# Patient Record
Sex: Male | Born: 1935
Health system: Southern US, Community
[De-identification: ages and names within clinical notes are randomized; demographics above are authoritative.]

## PROBLEM LIST (undated history)

## (undated) DIAGNOSIS — Z531 Procedure and treatment not carried out because of patient's decision for reasons of belief and group pressure: Secondary | ICD-10-CM

## (undated) DIAGNOSIS — N400 Enlarged prostate without lower urinary tract symptoms: Secondary | ICD-10-CM

## (undated) DIAGNOSIS — M1990 Unspecified osteoarthritis, unspecified site: Secondary | ICD-10-CM

## (undated) DIAGNOSIS — Z789 Other specified health status: Secondary | ICD-10-CM

## (undated) DIAGNOSIS — IMO0001 Reserved for inherently not codable concepts without codable children: Secondary | ICD-10-CM

## (undated) DIAGNOSIS — E785 Hyperlipidemia, unspecified: Secondary | ICD-10-CM

## (undated) DIAGNOSIS — G709 Myoneural disorder, unspecified: Secondary | ICD-10-CM

## (undated) DIAGNOSIS — I714 Abdominal aortic aneurysm, without rupture, unspecified: Secondary | ICD-10-CM

## (undated) DIAGNOSIS — I10 Essential (primary) hypertension: Secondary | ICD-10-CM

## (undated) HISTORY — PX: SIGMOIDOSCOPY: SUR1295

## (undated) HISTORY — PX: CHOLECYSTECTOMY: SHX55

## (undated) HISTORY — DX: Abdominal aortic aneurysm, without rupture, unspecified: I71.40

## (undated) HISTORY — DX: Unspecified osteoarthritis, unspecified site: M19.90

## (undated) HISTORY — PX: CARPAL TUNNEL RELEASE: SHX101

## (undated) HISTORY — DX: Benign prostatic hyperplasia without lower urinary tract symptoms: N40.0

## (undated) HISTORY — PX: EYE SURGERY: SHX253

## (undated) HISTORY — DX: Hyperlipidemia, unspecified: E78.5

## (undated) HISTORY — DX: Abdominal aortic aneurysm, without rupture: I71.4

## (undated) HISTORY — DX: Essential (primary) hypertension: I10

---

## 1998-11-20 ENCOUNTER — Ambulatory Visit (HOSPITAL_COMMUNITY): Admission: RE | Admit: 1998-11-20 | Discharge: 1998-11-20 | Payer: Self-pay | Admitting: Cardiology

## 1998-11-20 ENCOUNTER — Encounter: Payer: Self-pay | Admitting: Cardiology

## 1999-10-09 ENCOUNTER — Encounter: Admission: RE | Admit: 1999-10-09 | Discharge: 2000-01-07 | Payer: Self-pay | Admitting: Family Medicine

## 2001-06-22 ENCOUNTER — Encounter: Admission: RE | Admit: 2001-06-22 | Discharge: 2001-09-20 | Payer: Self-pay | Admitting: Family Medicine

## 2001-08-31 ENCOUNTER — Ambulatory Visit (HOSPITAL_COMMUNITY): Admission: RE | Admit: 2001-08-31 | Discharge: 2001-08-31 | Payer: Self-pay | Admitting: Specialist

## 2001-09-12 ENCOUNTER — Ambulatory Visit (HOSPITAL_COMMUNITY): Admission: RE | Admit: 2001-09-12 | Discharge: 2001-09-12 | Payer: Self-pay | Admitting: Specialist

## 2006-05-06 ENCOUNTER — Ambulatory Visit: Payer: Self-pay | Admitting: Family Medicine

## 2006-09-14 HISTORY — PX: COLONOSCOPY: SHX174

## 2006-10-20 ENCOUNTER — Ambulatory Visit: Payer: Self-pay | Admitting: Family Medicine

## 2006-10-27 ENCOUNTER — Ambulatory Visit: Payer: Self-pay | Admitting: Family Medicine

## 2006-11-02 ENCOUNTER — Ambulatory Visit: Payer: Self-pay | Admitting: Family Medicine

## 2007-05-23 ENCOUNTER — Ambulatory Visit: Payer: Self-pay | Admitting: Family Medicine

## 2007-07-08 ENCOUNTER — Ambulatory Visit: Payer: Self-pay | Admitting: Family Medicine

## 2007-07-13 ENCOUNTER — Ambulatory Visit (HOSPITAL_COMMUNITY): Admission: RE | Admit: 2007-07-13 | Discharge: 2007-07-13 | Payer: Self-pay | Admitting: Family Medicine

## 2007-08-26 ENCOUNTER — Ambulatory Visit: Payer: Self-pay | Admitting: Family Medicine

## 2007-09-13 ENCOUNTER — Encounter (HOSPITAL_BASED_OUTPATIENT_CLINIC_OR_DEPARTMENT_OTHER): Payer: Self-pay | Admitting: General Surgery

## 2007-09-15 ENCOUNTER — Inpatient Hospital Stay (HOSPITAL_COMMUNITY): Admission: RE | Admit: 2007-09-15 | Discharge: 2007-09-16 | Payer: Self-pay | Admitting: General Surgery

## 2007-09-15 HISTORY — PX: TOTAL KNEE ARTHROPLASTY: SHX125

## 2007-09-28 ENCOUNTER — Encounter: Admission: RE | Admit: 2007-09-28 | Discharge: 2007-09-28 | Payer: Self-pay | Admitting: Gastroenterology

## 2007-10-31 ENCOUNTER — Ambulatory Visit: Payer: Self-pay | Admitting: Family Medicine

## 2007-11-02 ENCOUNTER — Ambulatory Visit: Payer: Self-pay | Admitting: Family Medicine

## 2007-11-07 ENCOUNTER — Ambulatory Visit (HOSPITAL_COMMUNITY): Admission: RE | Admit: 2007-11-07 | Discharge: 2007-11-07 | Payer: Self-pay | Admitting: Ophthalmology

## 2007-11-07 ENCOUNTER — Encounter (INDEPENDENT_AMBULATORY_CARE_PROVIDER_SITE_OTHER): Payer: Self-pay | Admitting: Surgery

## 2007-11-17 ENCOUNTER — Ambulatory Visit: Payer: Self-pay | Admitting: Family Medicine

## 2008-01-17 ENCOUNTER — Ambulatory Visit: Payer: Self-pay | Admitting: Family Medicine

## 2008-01-17 ENCOUNTER — Encounter: Admission: RE | Admit: 2008-01-17 | Discharge: 2008-01-17 | Payer: Self-pay | Admitting: Family Medicine

## 2008-02-09 ENCOUNTER — Ambulatory Visit: Payer: Self-pay | Admitting: Family Medicine

## 2008-03-21 ENCOUNTER — Inpatient Hospital Stay (HOSPITAL_COMMUNITY): Admission: RE | Admit: 2008-03-21 | Discharge: 2008-03-27 | Payer: Self-pay | Admitting: Orthopedic Surgery

## 2008-03-27 ENCOUNTER — Ambulatory Visit: Payer: Self-pay | Admitting: Vascular Surgery

## 2008-03-27 ENCOUNTER — Encounter (INDEPENDENT_AMBULATORY_CARE_PROVIDER_SITE_OTHER): Payer: Self-pay | Admitting: Orthopedic Surgery

## 2008-05-22 ENCOUNTER — Ambulatory Visit: Payer: Self-pay | Admitting: Family Medicine

## 2008-11-23 ENCOUNTER — Ambulatory Visit: Payer: Self-pay | Admitting: Family Medicine

## 2008-11-29 ENCOUNTER — Ambulatory Visit: Payer: Self-pay | Admitting: Family Medicine

## 2009-01-21 ENCOUNTER — Ambulatory Visit: Payer: Self-pay | Admitting: Family Medicine

## 2009-01-27 IMAGING — CR DG CHEST 2V
2 series · 2 of 2 positions shown · non-contrast
Comparison: None.

CLINICAL DATA: Pre-admit for cholecystitis.   
 CHEST - 2 VIEW:

[view not recorded (1 of 2)]
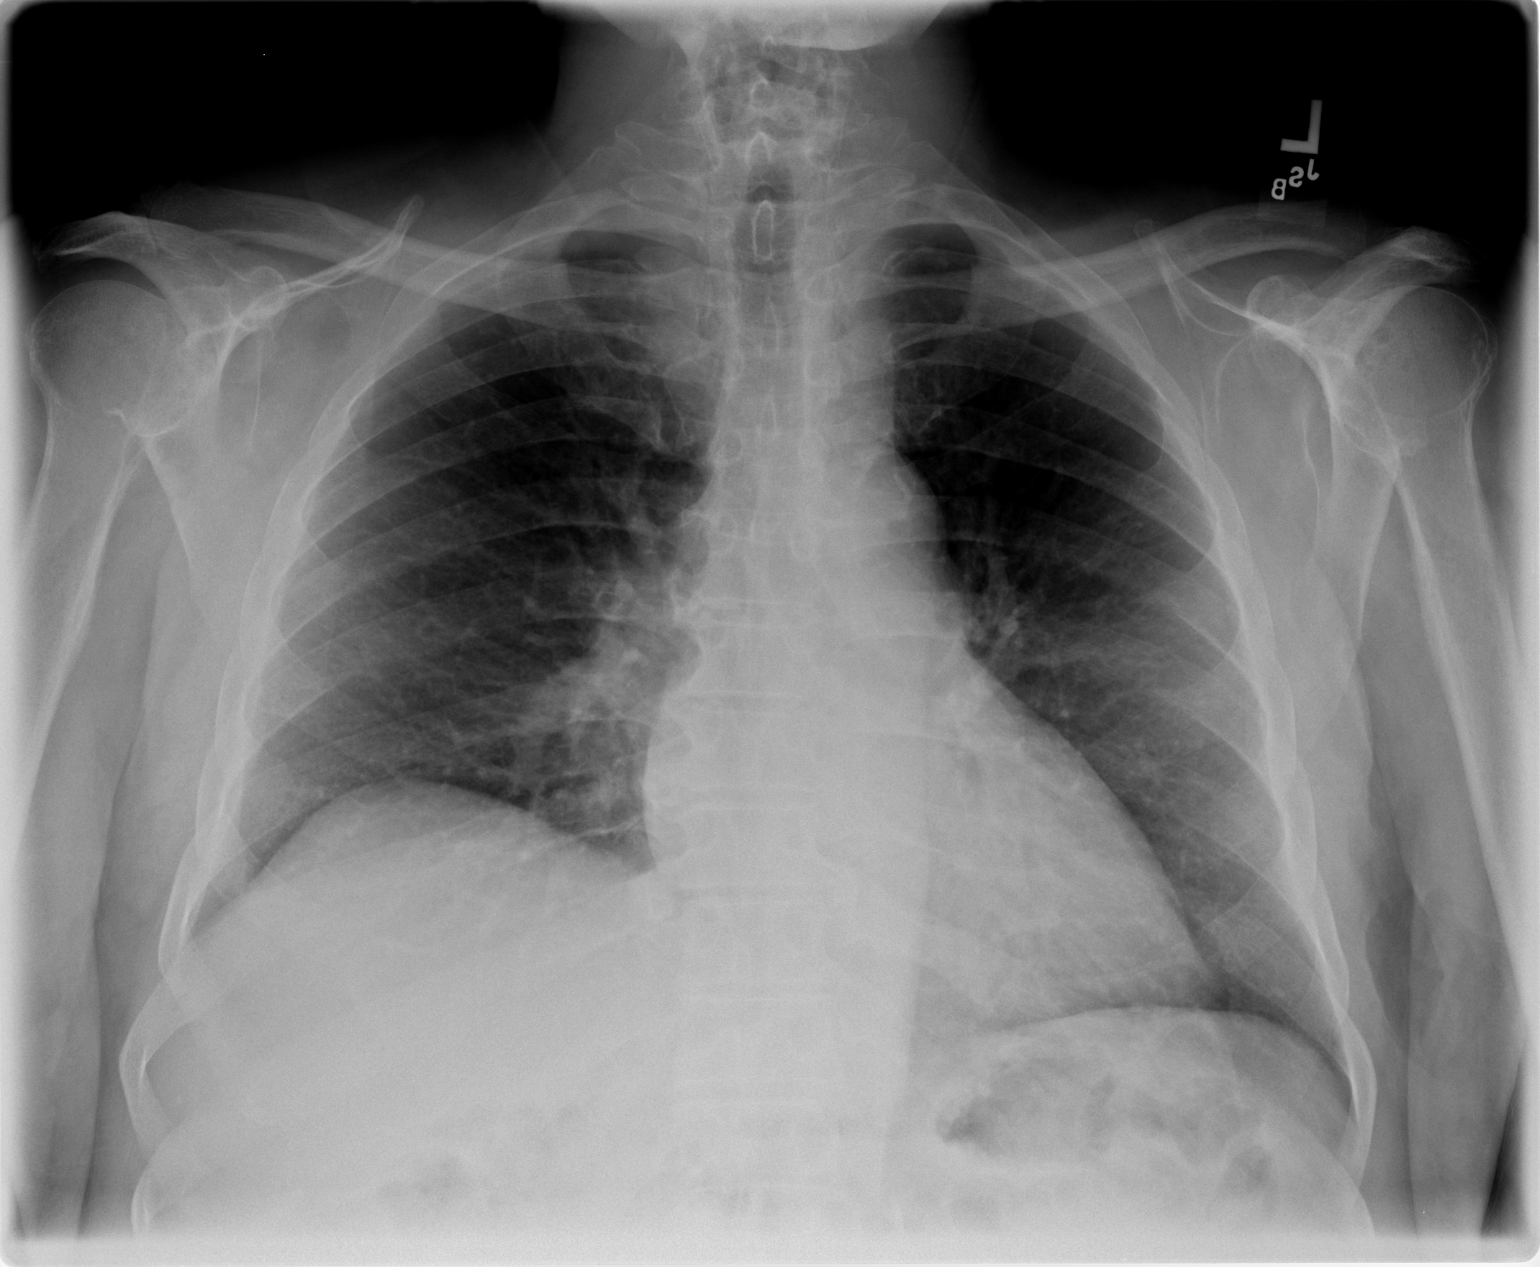

[view not recorded (2 of 2)]
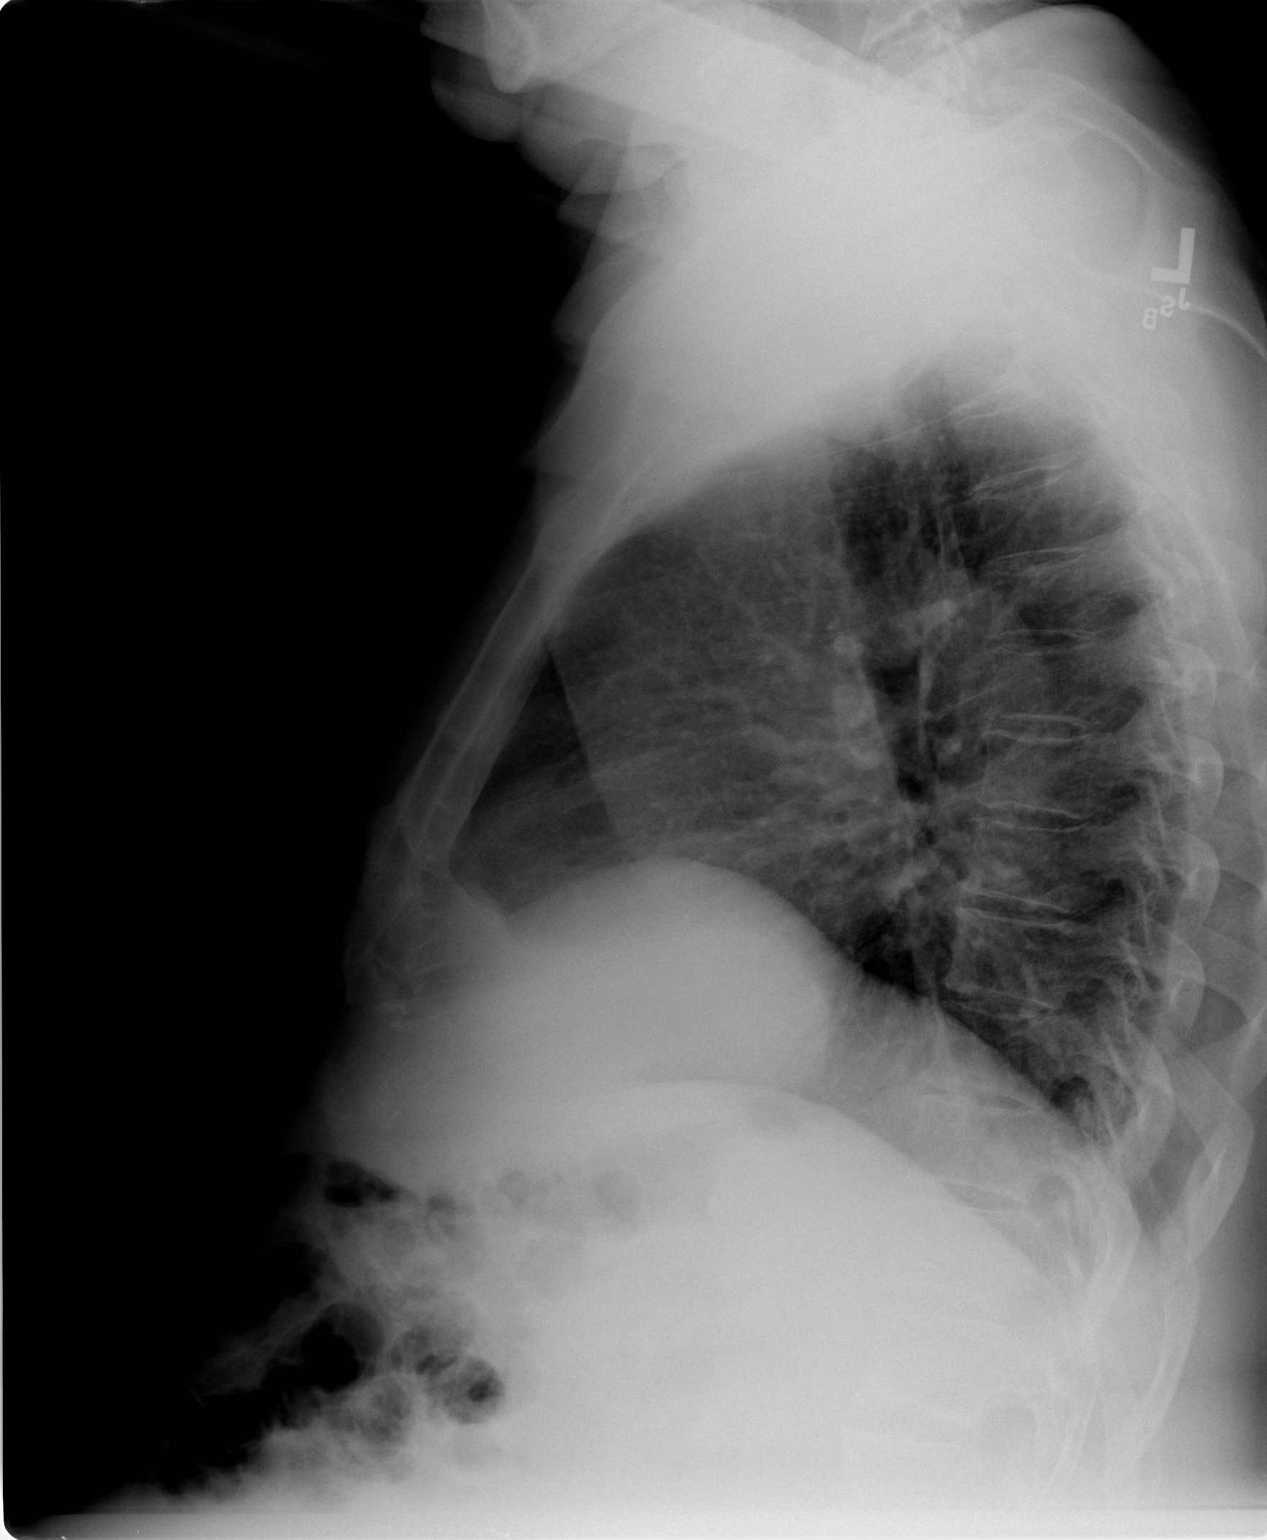

[2 of 2 positions shown; findings below may reference images not displayed]

FINDINGS: Heart and mediastinal contours normal.  Mild peribronchial thickening.  Mild elevation/eventration of the right hemidiaphragm.  No acute process.  Osseous structures intact.
IMPRESSION: No active disease.

## 2009-06-20 ENCOUNTER — Ambulatory Visit: Payer: Self-pay | Admitting: Family Medicine

## 2009-08-12 IMAGING — CR DG KNEE 1-2V PORT*L*
2 series · 2 of 2 positions shown · non-contrast
Comparison: 01/17/2008

CLINICAL DATA: Osteoarthritis

PORTABLE LEFT KNEE - 1-2 VIEW

[ap/obl knee]
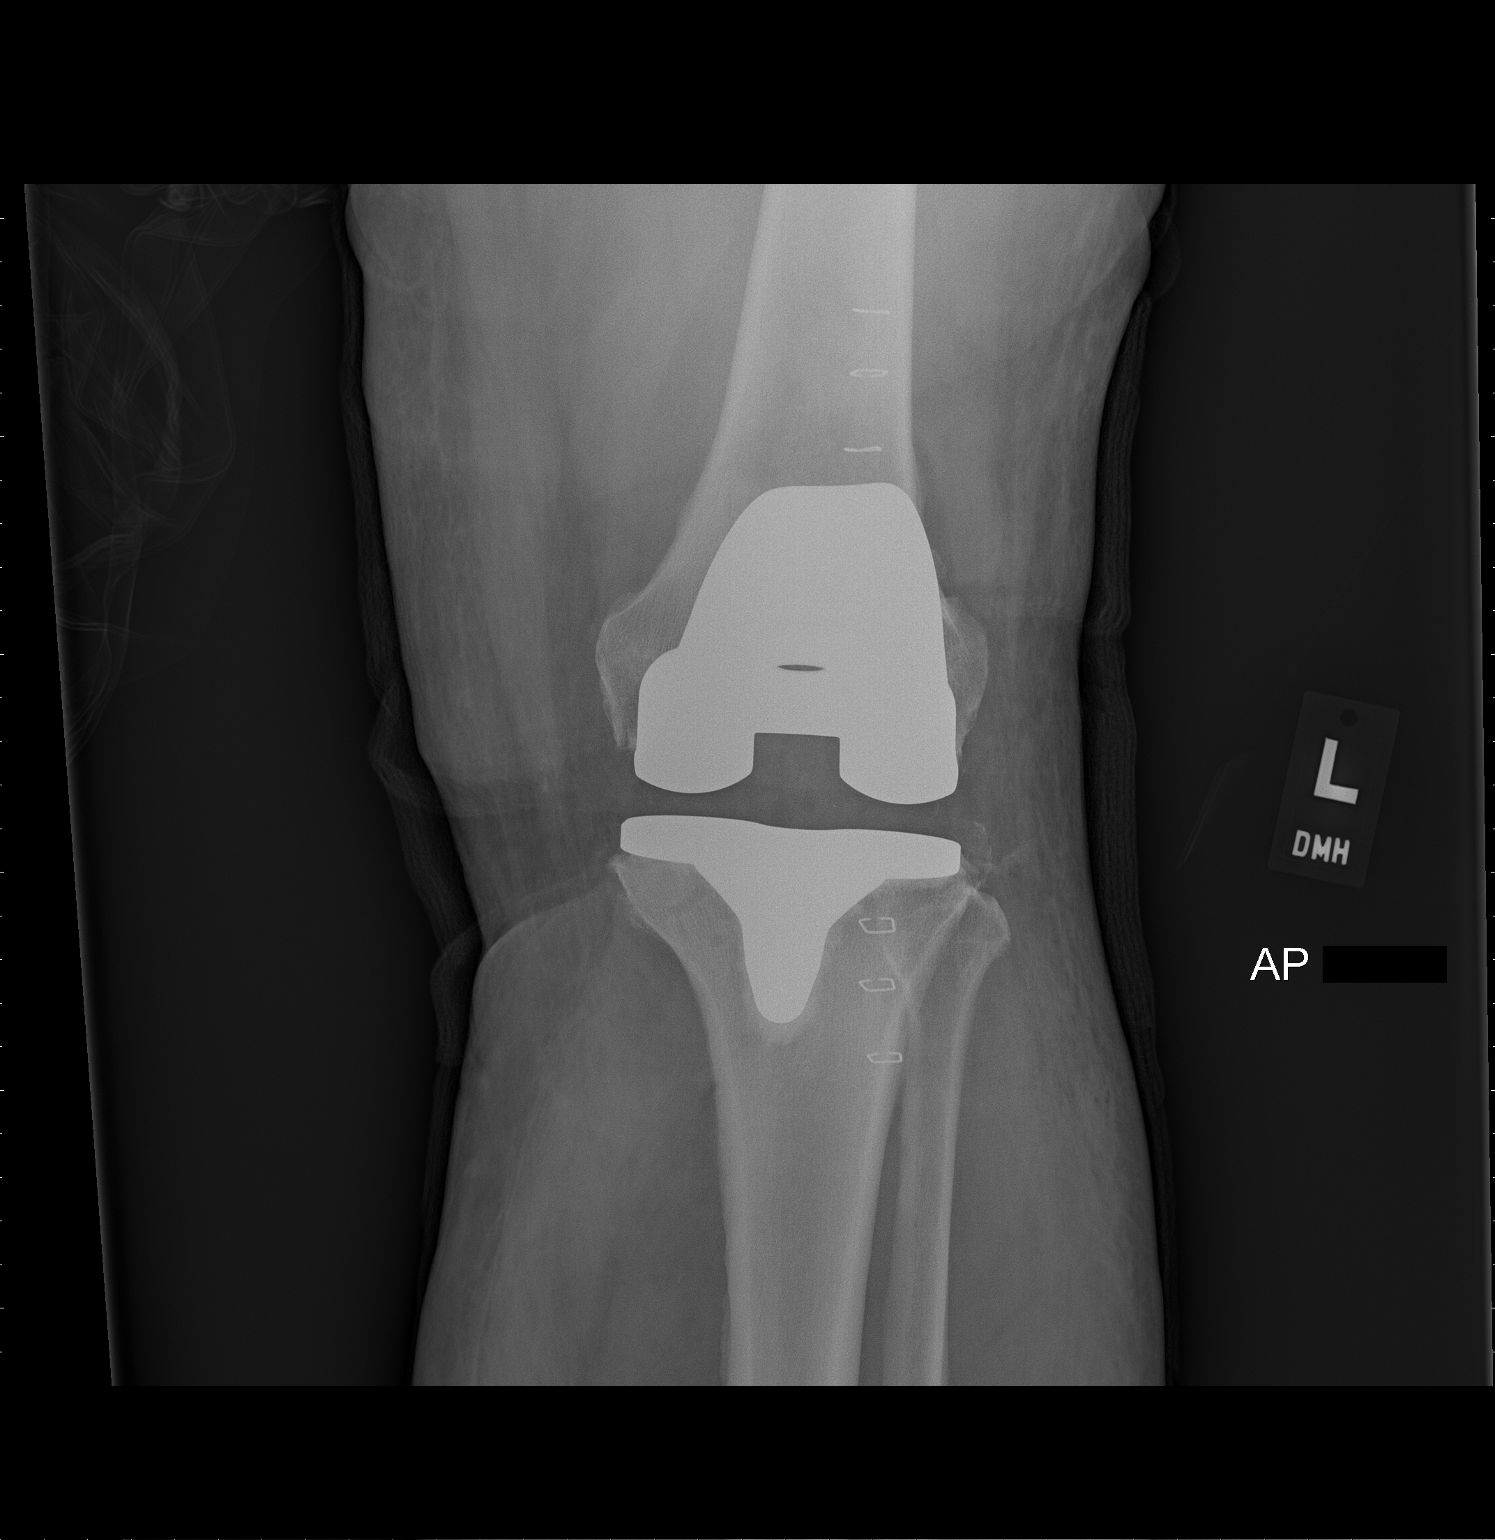

[knee lat]
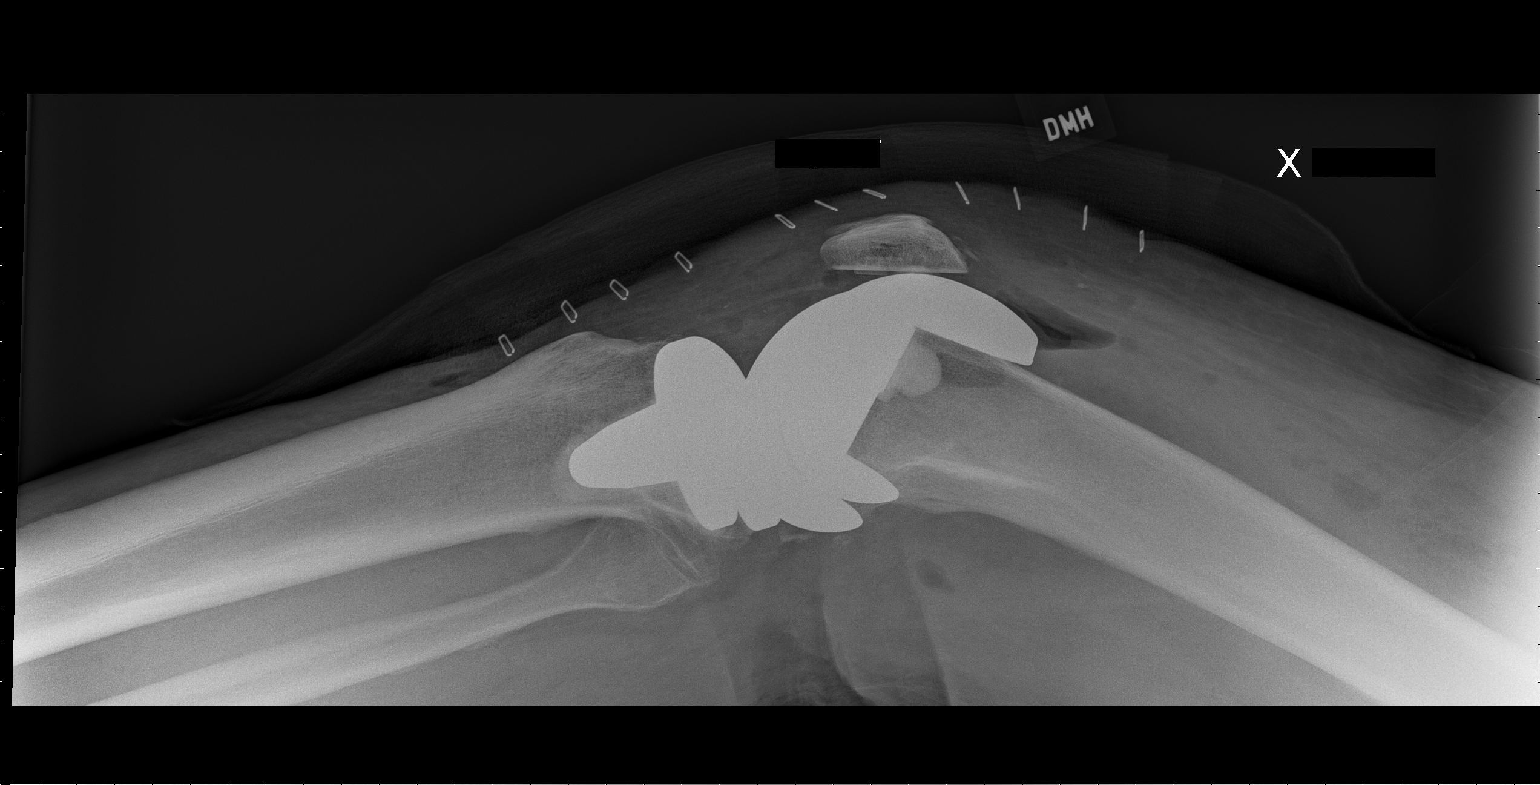

[2 of 2 positions shown; findings below may reference images not displayed]

FINDINGS: A total knee arthroplasty has been placed.  There is
anatomic alignment of the osseous and prosthetic structures.  There
is no breakage or loosening of the hardware.
IMPRESSION: Total knee arthroplasty, anatomically aligned.

## 2010-04-28 ENCOUNTER — Ambulatory Visit: Payer: Self-pay | Admitting: Family Medicine

## 2010-05-16 ENCOUNTER — Ambulatory Visit: Payer: Self-pay | Admitting: Physician Assistant

## 2010-05-29 ENCOUNTER — Ambulatory Visit: Payer: Self-pay | Admitting: Family Medicine

## 2010-10-05 ENCOUNTER — Encounter: Payer: Self-pay | Admitting: Physician Assistant

## 2010-11-07 ENCOUNTER — Ambulatory Visit (INDEPENDENT_AMBULATORY_CARE_PROVIDER_SITE_OTHER): Payer: Medicare Other | Admitting: Family Medicine

## 2010-11-07 DIAGNOSIS — L0291 Cutaneous abscess, unspecified: Secondary | ICD-10-CM

## 2010-11-07 DIAGNOSIS — E119 Type 2 diabetes mellitus without complications: Secondary | ICD-10-CM

## 2010-11-10 ENCOUNTER — Ambulatory Visit (INDEPENDENT_AMBULATORY_CARE_PROVIDER_SITE_OTHER): Payer: Medicare Other | Admitting: Family Medicine

## 2010-11-10 ENCOUNTER — Ambulatory Visit: Payer: BC Managed Care – PPO | Admitting: Family Medicine

## 2010-11-10 DIAGNOSIS — L0291 Cutaneous abscess, unspecified: Secondary | ICD-10-CM

## 2010-12-30 ENCOUNTER — Encounter (HOSPITAL_BASED_OUTPATIENT_CLINIC_OR_DEPARTMENT_OTHER): Payer: Medicare Other | Attending: Plastic Surgery

## 2010-12-30 DIAGNOSIS — Z79899 Other long term (current) drug therapy: Secondary | ICD-10-CM | POA: Insufficient documentation

## 2010-12-30 DIAGNOSIS — Y838 Other surgical procedures as the cause of abnormal reaction of the patient, or of later complication, without mention of misadventure at the time of the procedure: Secondary | ICD-10-CM | POA: Insufficient documentation

## 2010-12-30 DIAGNOSIS — T8189XA Other complications of procedures, not elsewhere classified, initial encounter: Secondary | ICD-10-CM | POA: Insufficient documentation

## 2010-12-30 DIAGNOSIS — Z96659 Presence of unspecified artificial knee joint: Secondary | ICD-10-CM | POA: Insufficient documentation

## 2010-12-30 DIAGNOSIS — E119 Type 2 diabetes mellitus without complications: Secondary | ICD-10-CM | POA: Insufficient documentation

## 2010-12-31 ENCOUNTER — Other Ambulatory Visit: Payer: Self-pay | Admitting: Plastic Surgery

## 2010-12-31 LAB — GLUCOSE, CAPILLARY: Glucose-Capillary: 308 mg/dL — ABNORMAL HIGH (ref 70–99)

## 2011-01-13 ENCOUNTER — Encounter (HOSPITAL_BASED_OUTPATIENT_CLINIC_OR_DEPARTMENT_OTHER): Payer: Medicare Other | Attending: Plastic Surgery

## 2011-01-13 DIAGNOSIS — Z79899 Other long term (current) drug therapy: Secondary | ICD-10-CM | POA: Insufficient documentation

## 2011-01-13 DIAGNOSIS — E119 Type 2 diabetes mellitus without complications: Secondary | ICD-10-CM | POA: Insufficient documentation

## 2011-01-13 DIAGNOSIS — T8189XA Other complications of procedures, not elsewhere classified, initial encounter: Secondary | ICD-10-CM | POA: Insufficient documentation

## 2011-01-13 DIAGNOSIS — Y838 Other surgical procedures as the cause of abnormal reaction of the patient, or of later complication, without mention of misadventure at the time of the procedure: Secondary | ICD-10-CM | POA: Insufficient documentation

## 2011-01-13 DIAGNOSIS — Z96659 Presence of unspecified artificial knee joint: Secondary | ICD-10-CM | POA: Insufficient documentation
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
## 2011-01-27 NOTE — Op Note (Signed)
NAME:  Joe Barry, Joe Barry NO.:  [id number]   MEDICAL RECORD NO.:  [id number]          PATIENT TYPE:  AMB   LOCATION:  SDS                          FACILITY:  [facility]   PHYSICIAN:  Ardeth Sportsman, MD     DATE OF BIRTH:  06-27-1934   DATE OF PROCEDURE:  DATE OF DISCHARGE:                               OPERATIVE REPORT   PRIMARY CARE PHYSICIAN:  [name], M.D.   SURGEON:  Ardeth Sportsman, MD   ASSISTANT:  Jacalyn Lefevre, P.A. student.   PREOPERATIVE DIAGNOSIS:  Left facial pain with increased sed rate,  question of temporal arteritis.   POSTOPERATIVE DIAGNOSIS:  Left facial pain with increased sed rate,  question of temporal arteritis.   PROCEDURE PERFORMED:  Left temporal artery excision with biopsy.   ANESTHESIA:  1. Moderate to deep sedation with laryngeal masked airway.  2. Local anesthetic and a field block.   SPECIMEN:  Temporal artery x 2.5 cm.   DRAINS:  None.   ESTIMATED BLOOD LOSS:  10 ml.   COMPLICATIONS:  Nomajor complications.   HISTORY:  Joe Barry is a pleasant 77 year old gentleman who started  having some left facial pain and workup was otherwise negative except  for an elevated sed rate.  Given the possibility of temporal arteritis,  a request was made for temporal artery biopsy.  This was requested also  due the fact that the patient is a diabetic and would be a challenge to  manage on steroids.   TECHNIQUE:  Excision of temporal artery was discussed.  The risks such  as stroke, heart attack, deep venous thrombosis, pulmonary embolism, and  death were discussed.  The risks such as bleeding, hematoma, seroma,  wound infection, abscess, injury to organs or nerves, prolonged pain and  other risks were discussed.  Questions were answered, and he and his  wife agreed to proceed.   OPERATIVE FINDINGS:  Normal healthy tortuous artery and anatomy.  The  temporal artery biopsy length is 2.5 cm.   DESCRIPTION OF PROCEDURE:  Informed  consent was confirmed.  The patient  received IV antibiotics just prior to surgery.  He was positioned supine  with his left arm tucked.  He underwent deep sedation with laryngeal  masked airway without any difficulty.  His left face was prepped and  draped in sterile fashion.   Doppler was used to help map out the pathway of the temporal artery.  A  3 cm incision was made just anterior to the left ear starting with the  tragus and moving more superiorly.  Cautery was used again in  subcutaneous tissues and just deep to the fascia encountered an obvious  vein.  This was dissected out, and the temporal artery was deeply  associated with this.  In dissecting, there was bleeding from a branch  off the vein, and this was controlled, and the vein was divided and  clamped.  Doppler was used to confirm that the remainder of the tubular  structures were in fact artery.  A 3-0 tie was placed around the artery  to help  mobilize it without minimizing grasping the adventitia.  It was  dissected a centimeter proximally and a centimeter and a half distally.  Both ends were clamped and cut off using the scalpel.  Specimen was sent  off in saline, untied for Pathology's recommendation.  The end of the  artery was secured using 3-0 Vicryl suture ligature.  Wound hemostasis  was excellent.  The wound was closed using interrupted 3-0 Vicryl with  deep dermal stitches and 4-0 Monocryl running subcuticular stitches and  Steri-strips.  The patient was extubated, had his LMA removed and was  taken to the recovery room in stable condition.   PLAN:  I am about to explain the operative findings to the patient's  wife.  We will have him follow up in about 7 to 10 days for an incision  check.  I will sent the pathology to Dr. Susann Givens.      Ardeth Sportsman, MD  Electronically Signed     SCG/MEDQ  D:  11/07/2007  T:  11/07/2007  Job:  [id number]   cc:   [name], M.D.

## 2011-01-27 NOTE — Op Note (Signed)
NAME:  Joe Barry, MOUNSEY NO.:  [id number]   MEDICAL RECORD NO.:  [id number]          PATIENT TYPE:  OIB   LOCATION:  5148                         FACILITY:  [facility]   PHYSICIAN:  Jordan Hawks. Elnoria Howard, MD    DATE OF BIRTH:  22-Mar-1934   DATE OF PROCEDURE:  09/16/2007  DATE OF DISCHARGE:                               OPERATIVE REPORT   ERCP PROCEDURE NOTE   REFERRING PHYSICIAN:  Dr. Dominga Ferry   PRIMARY CARE Leane Loring:  Dr. [name]   After informed consent was obtained from the patient and sedation was  achieved by anesthesia, the procedure commenced.  The duodenoscope was  advanced from the oral cavity to the second portion of the duodenum  without difficulty.  At that point the papilla was visualized.  It  appeared to be flat type of papilla.  Good positioning was difficult to  obtain.  However, with persistence the sphincterotome was able to engage  the papilla.  This was performed on multiple occasions.  Unfortunately,  the guidewire was not able to be passed though into the CBD.  At one  point the guidewire was able to be passed briefly into the pancreatic  duct.  However, the position was lost and the pancreatic duct was not  able to be stented to help facilitate the CBD cannulation.  Further  attempt to cannulate the CBD only produced more edema at the papilla.  However, during the entire procedure a copious amount of bile was  draining.  There was no evidence of any pus.  Because of the increased  edema and the difficulty with visualizing papilla, the procedure was  abandoned.   PLAN AT THIS TIME:  I feel that the patient is safe to be discharged  home.  I will have him follow up with me in the office this coming  Wednesday for repeat liver panel and most likely to repeat the CT scan  of abdomen to discern if he still has a retained stone.      Jordan Hawks Elnoria Howard, MD  Electronically Signed     PDH/MEDQ  D:  09/16/2007  T:  09/16/2007  Job:  [id number]   cc:   Leonie Man, M.D.  [name], M.D.

## 2011-01-27 NOTE — Discharge Summary (Signed)
NAME:  Joe Barry, Joe Barry NO.:  [id number]   MEDICAL RECORD NO.:  [id number]          PATIENT TYPE:  OIB   LOCATION:  5148                         FACILITY:  [facility]   PHYSICIAN:  Leonie Man, M.D.   DATE OF BIRTH:  03/16/1934   DATE OF ADMISSION:  09/13/2007  DATE OF DISCHARGE:  09/16/2007                               DISCHARGE SUMMARY   ADMISSION DIAGNOSIS:  Chronic calculous cholecystitis.   DISCHARGE DIAGNOSES:  1. Chronic calculous cholecystitis.  2. Choledocholithiasis.   PROCEDURES IN HOSPITAL:  Laparoscopic cholecystectomy with  intraoperative cholangiogram.   FINDINGS:  Common bile duct stone.   CONSULTATIONS:  Dr. Jeani Hawking for choledocholithiasis.   PROCEDURES:  Attempted ERCP x2.   COMPLICATIONS:  Brief airway obstruction during ERCP.   NOTE:  Joe Barry is a 77 year old man with a past history of  hypertension, diabetes, hyperlipidemia and colonic polyps who presented  with abdominal pain on evaluation by abdominal ultrasound.  Was noted to  have cholelithiasis.  He was admitted to the hospital and brought to the  operating room on the day of admission where he underwent laparoscopic  cholecystectomy and intraoperative cholangiogram.  Intraoperative  cholangiogram showed what appeared to be a stone in the distal common  bile duct.  The patient was admitted to the hospital and  Gastrointestinal consultation was sought.  On the first attempted ERCP,  the patient had an obstructed the airway and the procedure had to be  aborted.  He was very quickly resuscitated without any incident.  The  patient stayed in the hospital for a second attempt; however, this was  also unsuccessful as the common bile duct could not be cannulated.   The plan is for discharge from the hospital to be followed up with Dr.  Elnoria Howard in approximately 1 week.  We will see whether or not the patient is  able to pass his stone spontaneously or whether he will need  another  attempted ERCP.  He is to follow up with Dr. Lurene Shadow in office in  approximately 3 weeks.      Leonie Man, M.D.  Electronically Signed     PB/MEDQ  D:  09/16/2007  T:  09/16/2007  Job:  [id number]   cc:   [facility]

## 2011-01-27 NOTE — Op Note (Signed)
NAME:  Joe Barry, Joe Barry NO.:  [id number]   MEDICAL RECORD NO.:  [id number]          PATIENT TYPE:  INP   LOCATION:  5004                         FACILITY:  [facility]   PHYSICIAN:  Nadara Mustard, MD     DATE OF BIRTH:  1934/09/06   DATE OF PROCEDURE:  03/21/2008  DATE OF DISCHARGE:                               OPERATIVE REPORT   PREOPERATIVE DIAGNOSIS:  Osteoarthritis, left knee.   POSTOPERATIVE DIAGNOSIS:  Osteoarthritis, left knee.   PROCEDURE:  Left total knee arthroplasty with #3 femur, #3 tibia, 10-mm  poly tray, posterior-stabilized rotating platform with a 32-mm patella.   SURGEON:  Nadara Mustard, MD   ANESTHESIA:  General plus femoral block.   ESTIMATED BLOOD LOSS:  Minimal.   ANTIBIOTICS:  Kefzol 1 gram.   DRAINS:  None.   COMPLICATIONS:  None.   TOURNIQUET TIME:  39 minutes at 300 mmHg at the thigh.   DISPOSITION:  To PACU in stable condition.   INDICATIONS FOR PROCEDURE:  The patient is a 77 year old gentleman with  severe osteoarthritis of his left knee.  He has varus collapse as well  as extension block with less than 10 degrees to full extension.  The  patient has failed conservative care.  He has pain with activities of  daily living and presents at this time for total knee arthroplasty.  Risks and benefits were discussed including infection, neurovascular  injury, persistent pain, DVT, pulmonary embolism, and need for  additional surgery.  The patient states he understands and wished to  proceed at this time.   DESCRIPTION OF PROCEDURE:  The patient was brought to OR room 5 after  undergoing a femoral block.  He then underwent general anesthetic.  After adequate level of anesthesia was obtained, the patient's left  lower extremity was prepped using DuraPrep, draped in a sterile field,  and Ioban was used to cover all exposed skin.  A midline incision was  made.  This was carried down to the medial parapatellar retinacular  incision.   The patient was a Jehovah's Witness and extreme caution was  taken to ensure that all small blood vessels were cauterized.  The  patella was everted, the femoral canal was entered with the drill, the  IM guide was placed at 7 degrees of valgus, and 11 mm was taken off the  distal femur.  This sized for a size 3 and the chamfer cuts were made  for the size 3 femur.  Attention was then focused on the tibia.  External alignment guide was used with neutral varus, valgus, and  neutral posterior slope.  The patient had significant amount of wear  medially with the varus alignment and this cut was taken with a 10 mm  taking off the lateral tibial plateau.  This sized for size 3, then  trial 3 size block was placed, and the punch hole was made for the keel  and the keel was placed.  The box cut template was then used on the  femur.  Box cut was made and the posterior stabilized femur was  inserted.  This was tried with the 10 and an attempt was tried with a  12.5 spacer.  The 12.5 spacer would not fit.  The 10 spacer had good  alignment, good varus and valgus stress.  It also showed good stability.  The drill holes were made for the lugs for the femur.  Attention was  then focused on the patella, 10 mm was taken off the patella, and this  was sized for 32 and the lug cuts were drilled for the size 32 patella.  The knee was then irrigated with pulsatile lavage.  The posterior aspect  of the knee was injected with a total 30 mL of 0.5% Marcaine plain.  Care was taken to not intravascularly injected the Marcaine.  The knee  was irrigated further with the pulsatile lavage.  The tibial-femoral  components were cemented.  The loose cement was removed.  The tibial  tray was placed.  The knee was left in extension until the cement had  hardened.  The patella was then cemented in place and again the patella  clamp was left in place until the cement hardened.  A pulsatile lavage  was performed once the  cement had hardened.  The clamps were removed.  The knee was placed through full range of motion.  There was no lateral  subluxation of the patella.  The tourniquet was deflated after 39  minutes and further hemostasis was obtained.  The retinaculum was closed  using #1 Vicryl, subcu was closed using 2-0 Vicryl, and the skin was  closed using approximating staples.  The wound was covered with Adaptic,  orthopedic sponges, ABD dressing, Webril, and Coban.  The patient was  extubated and taken to PACU in stable condition.      Nadara Mustard, MD  Electronically Signed     MVD/MEDQ  D:  03/21/2008  T:  03/22/2008  Job:  [id number] [id number]

## 2011-01-30 NOTE — Discharge Summary (Signed)
NAME:  SHOOTER, TANGEN NO.:  [id number]   MEDICAL RECORD NO.:  [id number]          PATIENT TYPE:  INP   LOCATION:  5004                         FACILITY:  [facility]   PHYSICIAN:  Nadara Mustard, MD     DATE OF BIRTH:  August 04, 1934   DATE OF ADMISSION:  03/21/2008  DATE OF DISCHARGE:  03/27/2008                               DISCHARGE SUMMARY   FINAL DIAGNOSIS:  Osteoarthritis, left hip.   PROCEDURE:  Left total hip arthroplasty with DePuy components, size 3  femur, size 3 tibia, and 10-mm posterior stabilized poly tray with a 32-  mm patella.   Discharged to home in stable condition with home health physical  therapy.  Follow up with me in the office in 2 weeks.   INDICATIONS FOR PROCEDURE:  The patient is a 77 year old gentleman with  osteoarthritis of his left knee.  He has failed conservative care and  has pain with activities of daily living and presents at this time for a  total knee arthroplasty.   The patient's hospital course was essentially unremarkable.  He  underwent a total knee arthroplasty of the left knee on March 21, 2008.  He received Kefzol for infection prophylaxis and the tourniquet time was  39 minutes to 300 mmHg at the thigh.  Postoperatively, the patient  progressed slowly with his therapy.  His hemoglobin A1c was 8.6.  His  hemoglobin was 10.5 on postoperative day #1.  Postoperative day #2, his  hemoglobin was 10.4.  It was felt that the patient would require some  extra days of hospitalization due to his slow progression with his  therapy.  The patient was discharged to home in stable condition on July  14, with home health physical therapy.  Follow up with me in the office  in 2 weeks.      Nadara Mustard, MD  Electronically Signed     MVD/MEDQ  D:  05/01/2008  T:  05/02/2008  Job:  [id number]

## 2011-02-10 ENCOUNTER — Telehealth: Payer: Self-pay | Admitting: Family Medicine

## 2011-02-10 NOTE — Telephone Encounter (Signed)
Faxed back refill auth. Pt can have this month but needs diabetes check

## 2011-03-16 ENCOUNTER — Emergency Department (HOSPITAL_COMMUNITY)
Admission: EM | Admit: 2011-03-16 | Discharge: 2011-03-16 | Disposition: A | Discharge status: Home / Self Care | Payer: Medicare Other | Attending: Emergency Medicine | Admitting: Emergency Medicine

## 2011-03-16 ENCOUNTER — Emergency Department (HOSPITAL_COMMUNITY): Payer: Medicare Other

## 2011-03-16 DIAGNOSIS — E119 Type 2 diabetes mellitus without complications: Secondary | ICD-10-CM | POA: Insufficient documentation

## 2011-03-16 DIAGNOSIS — M2550 Pain in unspecified joint: Secondary | ICD-10-CM | POA: Insufficient documentation

## 2011-03-16 DIAGNOSIS — J3489 Other specified disorders of nose and nasal sinuses: Secondary | ICD-10-CM | POA: Insufficient documentation

## 2011-03-16 LAB — DIFFERENTIAL
Eosinophils Absolute: 0.1 10*3/uL (ref 0.0–0.7)
Eosinophils Relative: 1 % (ref 0–5)
Lymphocytes Relative: 16 % (ref 12–46)
Lymphs Abs: 1.3 10*3/uL (ref 0.7–4.0)
Monocytes Absolute: 0.8 10*3/uL (ref 0.1–1.0)

## 2011-03-16 LAB — URINALYSIS, ROUTINE W REFLEX MICROSCOPIC
Bilirubin Urine: NEGATIVE
Ketones, ur: 15 mg/dL — AB
Leukocytes, UA: NEGATIVE
Nitrite: NEGATIVE
Protein, ur: NEGATIVE mg/dL
Urobilinogen, UA: 1 mg/dL (ref 0.0–1.0)

## 2011-03-16 LAB — BASIC METABOLIC PANEL
BUN: 12 mg/dL (ref 6–23)
Calcium: 9.4 mg/dL (ref 8.4–10.5)
Creatinine, Ser: 0.76 mg/dL (ref 0.50–1.35)
GFR calc Af Amer: >60 mL/min (ref >60)
GFR calc non Af Amer: >60 mL/min (ref >60)

## 2011-03-16 LAB — CBC
HCT: 36.9 % — ABNORMAL LOW (ref 39.0–52.0)
MCHC: 34.1 g/dL (ref 30.0–36.0)
MCV: 83.3 fL (ref 78.0–100.0)
Platelets: 202 10*3/uL (ref 150–400)
RDW: 15.3 % (ref 11.5–15.5)
WBC: 7.8 10*3/uL (ref 4.0–10.5)

## 2011-04-20 ENCOUNTER — Encounter: Payer: Self-pay | Admitting: Family Medicine

## 2011-04-20 ENCOUNTER — Other Ambulatory Visit: Payer: Self-pay | Admitting: Family Medicine

## 2011-04-20 ENCOUNTER — Ambulatory Visit (INDEPENDENT_AMBULATORY_CARE_PROVIDER_SITE_OTHER): Payer: Medicare Other | Admitting: Family Medicine

## 2011-04-20 VITALS — BP 110/70 | HR 80 | Wt 196.0 lb

## 2011-04-20 DIAGNOSIS — N419 Inflammatory disease of prostate, unspecified: Secondary | ICD-10-CM

## 2011-04-20 DIAGNOSIS — N400 Enlarged prostate without lower urinary tract symptoms: Secondary | ICD-10-CM

## 2011-04-20 DIAGNOSIS — M129 Arthropathy, unspecified: Secondary | ICD-10-CM

## 2011-04-20 DIAGNOSIS — M1990 Unspecified osteoarthritis, unspecified site: Secondary | ICD-10-CM

## 2011-04-20 DIAGNOSIS — N390 Urinary tract infection, site not specified: Secondary | ICD-10-CM

## 2011-04-20 LAB — POCT URINALYSIS DIPSTICK
Bilirubin, UA: NEGATIVE
Blood, UA: 200
Leukocytes, UA: POSITIVE
Nitrite, UA: NEGATIVE
Protein, UA: 30
Spec Grav, UA: 1.02
Urobilinogen, UA: NEGATIVE
pH, UA: 5

## 2011-04-20 MED ORDER — HYDROCODONE-ACETAMINOPHEN 7.5-500 MG PO TABS: 2.0000 | ORAL_TABLET | Freq: Four times a day (QID) | ORAL | Status: AC | PRN

## 2011-04-20 MED ORDER — SULFAMETHOXAZOLE-TRIMETHOPRIM 800-160 MG PO TABS: 1.0000 | ORAL_TABLET | Freq: Two times a day (BID) | ORAL | Status: AC

## 2011-04-20 MED ORDER — PIOGLITAZONE HCL-METFORMIN HCL 15-850 MG PO TABS: 1.0000 | ORAL_TABLET | Freq: Two times a day (BID) | ORAL | Status: DC

## 2011-04-20 NOTE — Progress Notes (Signed)
  Subjective:    Patient ID: Joe Barry, male    DOB: 1933/10/18, 77 y.o.   MRN: [id number]  HPI He is here for multiple reasons. He has a long history of difficulty with neck, shoulder, back and extremity pain. He has tried Aleve and Advil with minimal relief of his symptoms. He was seen in the emergency room in July and given Vicodin which did help. 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 He also complains of seeing some blood in his urine. He has had no fever, chills but some low back pain as well as decreased stream and hesitancy.  Review of Systems     Objective:   Physical Exam Alert and in no distress. Good motion of his neck. No joint tenderness or swelling noted in his hands, wrists, elbows or knees. Prostate exam shows a slightly tender and boggy prostate. Urinalysis is positive for bacteria white and red cells.       Assessment & Plan:  Arthritis. Prostatitis. Discussed use of Tylenol and then Advil or Aleve which is already done. He will try higher doses of Advil 800 3 times a day. A prescription for Celebrex was given. She will check on the cost of this. I also wrote for Vicodin which will be used very judiciously.

## 2011-04-20 NOTE — Patient Instructions (Addendum)
Take all the antibiotic and stay on Actos plus. Return here in 2 weeks. He may use Vicodin but it needs to use very sparingly and avoid being around machinery or driving.

## 2011-04-22 LAB — CULTURE, URINE COMPREHENSIVE

## 2011-04-23 ENCOUNTER — Telehealth: Payer: Self-pay | Admitting: *Deleted

## 2011-04-23 NOTE — Telephone Encounter (Addendum)
Message copied by Dorthula Perfect on Thu Apr 23, 2011 11:47 AM ------      Message from: Ronnald Nian      Created: Thu Apr 23, 2011  8:12 AM       Let him know that he has an infection and is on the correct antibiotic. Have him return here in 2 weeks for recheck on his urine   Pt notified of urine results.  Pt is scheduled to come in for a follow up on 05-06-11.  CM, LPN

## 2011-04-23 NOTE — Telephone Encounter (Deleted)
Done

## 2011-05-06 ENCOUNTER — Other Ambulatory Visit (INDEPENDENT_AMBULATORY_CARE_PROVIDER_SITE_OTHER): Payer: Medicare Other

## 2011-05-06 DIAGNOSIS — N390 Urinary tract infection, site not specified: Secondary | ICD-10-CM

## 2011-05-06 LAB — POCT URINALYSIS DIPSTICK
Bilirubin, UA: NEGATIVE
Leukocytes, UA: NEGATIVE
Nitrite, UA: NEGATIVE
Protein, UA: NEGATIVE
pH, UA: 5

## 2011-05-07 ENCOUNTER — Telehealth: Payer: Self-pay

## 2011-05-07 NOTE — Telephone Encounter (Signed)
Called pt to inform him ua is neg

## 2011-05-21 ENCOUNTER — Other Ambulatory Visit: Payer: Self-pay | Admitting: Family Medicine

## 2011-06-08 LAB — BASIC METABOLIC PANEL
Calcium: 8.9
Chloride: 106
Creatinine, Ser: 0.96
Sodium: 137

## 2011-06-08 LAB — CBC
Platelets: 263
WBC: 4.4

## 2011-06-11 LAB — BASIC METABOLIC PANEL
BUN: 17
BUN: 25 — ABNORMAL HIGH
CO2: 26
Calcium: 8.5
Calcium: 8.6
Chloride: 104
Creatinine, Ser: 1.08
GFR calc Af Amer: >60
GFR calc Af Amer: >60
GFR calc non Af Amer: >60
GFR calc non Af Amer: >60
GFR calc non Af Amer: >60
Potassium: 4
Potassium: 4.3
Sodium: 134 — ABNORMAL LOW
Sodium: 135
Sodium: 138

## 2011-06-11 LAB — COMPREHENSIVE METABOLIC PANEL
BUN: 13
CO2: 26
Chloride: 106
Creatinine, Ser: 0.79
GFR calc non Af Amer: >60
Glucose, Bld: 87
Total Bilirubin: 0.9

## 2011-06-11 LAB — PROTIME-INR
INR: 1
INR: 1.1
INR: 1.3
INR: 1.9 — ABNORMAL HIGH
INR: 3.1 — ABNORMAL HIGH
Prothrombin Time: 13.2
Prothrombin Time: 13.9
Prothrombin Time: 17 — ABNORMAL HIGH
Prothrombin Time: 22.1 — ABNORMAL HIGH
Prothrombin Time: 23.1 — ABNORMAL HIGH
Prothrombin Time: 34.8 — ABNORMAL HIGH

## 2011-06-11 LAB — CBC
HCT: 28.8 — ABNORMAL LOW
HCT: 31.3 — ABNORMAL LOW
HCT: 37.6 — ABNORMAL LOW
Hemoglobin: 10.4 — ABNORMAL LOW
Hemoglobin: 12.7 — ABNORMAL LOW
MCV: 84.6
Platelets: 179
Platelets: 196
RBC: 3.41 — ABNORMAL LOW
RBC: 3.65 — ABNORMAL LOW
RDW: 16.3 — ABNORMAL HIGH
WBC: 5.4
WBC: 6.8
WBC: 6.9
WBC: 8.8

## 2011-06-11 LAB — HEMOGLOBIN A1C: Mean Plasma Glucose: 229

## 2011-06-19 LAB — BASIC METABOLIC PANEL
BUN: 16
Chloride: 101
Creatinine, Ser: 0.94
GFR calc non Af Amer: >60
Glucose, Bld: 100 — ABNORMAL HIGH
Potassium: 4.2

## 2011-06-19 LAB — CBC
HCT: 37.1 — ABNORMAL LOW
MCHC: 33.1
MCV: 84.6
MCV: 85.1
Platelets: 238
Platelets: 285
RDW: 16.6 — ABNORMAL HIGH

## 2011-06-19 LAB — COMPREHENSIVE METABOLIC PANEL
Albumin: 2.7 — ABNORMAL LOW
BUN: 16
Calcium: 8.5
Creatinine, Ser: 0.93
GFR calc Af Amer: >60
Total Bilirubin: 0.8
Total Protein: 5.9 — ABNORMAL LOW

## 2011-06-19 LAB — HEPATIC FUNCTION PANEL
ALT: 61 — ABNORMAL HIGH
AST: 68 — ABNORMAL HIGH
Albumin: 2.7 — ABNORMAL LOW
Alkaline Phosphatase: 55
Total Bilirubin: 0.7

## 2011-06-19 LAB — LIPASE, BLOOD: Lipase: 14

## 2011-06-19 LAB — NO BLOOD PRODUCTS

## 2011-07-07 NOTE — H&P (Signed)
  NAME:  Joe Barry, Joe Barry NO.:  [id number]  MEDICAL RECORD NO.:  [id number]           PATIENT TYPE:  O  LOCATION:  FOOT                         FACILITY:  [facility]  PHYSICIAN:  Joanne Gavel, M.D.        DATE OF BIRTH:  1934-01-03  DATE OF ADMISSION:  12/30/2010 DATE OF DISCHARGE:                             HISTORY & PHYSICAL   CHIEF COMPLAINT:  Wound left leg.  HISTORY OF PRESENT ILLNESS:  This is a 77 year old diabetic male developed a small abscess in the anterior left leg approximately 2 months ago.  This was I and D'ed.  It was healed over with a scab but today when the scab was removed, there was an open wound present.  There was some surrounding swelling.  There has been no fevers, chills or sweating spells.  No cough, chest pain or pleurisy.  PAST MEDICAL HISTORY:  The patient is diabetic and has been for 20 years, has no heart disease, lung disease, kidney disease or CVA.  PAST SURGICAL HISTORY:  He has had knee replacement on the left side, cataract operation and cholecystectomy in 2009.  SOCIAL HISTORY:  Cigarettes none for 30+ years.  Alcohol none.  MEDICATIONS:  Losartan, Actos, metformin, Lantus, lisinopril and metoprolol.  ALLERGIES:  None.  REVIEW OF SYSTEMS:  Otherwise, noncontributory.  PHYSICAL EXAMINATION:  VITAL SIGNS:  Temperature 97.6, pulse 76, respirations 18, blood pressure 170/70.  Glucose is 308. EYES, EARS, NOSE, THROAT:  Normal. NECK:  Supple. CHEST:  Clear. HEART:  Regular rhythm. ABDOMEN:  Not examined. EXTREMITIES:  The left leg is somewhat swollen anteriorly.  There is a 1.3 x 1.5 skin defect with a shaggy base.  Peripheral pulses are palpable bilaterally, otherwise, normal skin nutrition.  The area surrounding the ulceration is somewhat swollen, red and tender.  IMPRESSION:  Status post abscess with non-healed wound in a patient with diabetes.  PLAN:  Santyl, hydrogel, Keflex 500 t.i.d.  See me in 7 days.     Joanne Gavel, M.D.     RA/MEDQ  D:  12/31/2010  T:  12/31/2010  Job:  [id number]  Electronically Signed by Joanne Gavel M.D. on 07/07/2011 08:54:16 AM

## 2011-07-13 ENCOUNTER — Encounter: Payer: Self-pay | Admitting: Family Medicine

## 2011-07-13 ENCOUNTER — Ambulatory Visit (INDEPENDENT_AMBULATORY_CARE_PROVIDER_SITE_OTHER): Payer: Medicare Other | Admitting: Family Medicine

## 2011-07-13 VITALS — BP 140/90 | HR 64 | Temp 97.7°F | Wt 199.0 lb

## 2011-07-13 DIAGNOSIS — K219 Gastro-esophageal reflux disease without esophagitis: Secondary | ICD-10-CM

## 2011-07-13 DIAGNOSIS — J111 Influenza due to unidentified influenza virus with other respiratory manifestations: Secondary | ICD-10-CM

## 2011-07-13 NOTE — Progress Notes (Signed)
  Subjective:    Patient ID: Joe Barry, male    DOB: [birth year], 77 y.o.   MRN: [id number]  HPI Approximately one week ago he developed a sore throat, runny nose and a few days later developed a cough. No fever, chills or earache. Now the cough is only bothering him in the morning and late evening. He does not smoke.  Review of Systems     Objective:   Physical Exam alert and in no distress. Tympanic membranes and canals are normal. Throat is clear. Tonsils are normal. Neck is supple without adenopathy or thyromegaly. Cardiac exam shows a regular sinus rhythm without murmurs or gallops. Lungs are clear to auscultation.        Assessment & Plan:   1. Bronchitis with flu   2. GERD (gastroesophageal reflux disease)    try Prilosec to see if this will help with cough. Also use NyQuil. If continued difficulty, give me a call.

## 2011-07-13 NOTE — Patient Instructions (Signed)
Use 2 Prilosec at night to see if that will help the coughing. He can also use NyQuil. Call me if you do not improve

## 2011-08-24 ENCOUNTER — Encounter: Payer: Self-pay | Admitting: Internal Medicine

## 2011-08-24 ENCOUNTER — Other Ambulatory Visit (INDEPENDENT_AMBULATORY_CARE_PROVIDER_SITE_OTHER): Payer: Medicare Other

## 2011-08-24 DIAGNOSIS — Z23 Encounter for immunization: Secondary | ICD-10-CM

## 2011-12-22 ENCOUNTER — Telehealth: Payer: Self-pay | Admitting: Internal Medicine

## 2011-12-22 MED ORDER — LISINOPRIL 40 MG PO TABS: 40.0000 mg | ORAL_TABLET | Freq: Every day | ORAL | Status: DC

## 2011-12-22 MED ORDER — HYDROCHLOROTHIAZIDE 12.5 MG PO CAPS: 12.5000 mg | ORAL_CAPSULE | Freq: Every day | ORAL | Status: DC

## 2011-12-22 NOTE — Telephone Encounter (Signed)
Med sent in.

## 2011-12-30 ENCOUNTER — Encounter (INDEPENDENT_AMBULATORY_CARE_PROVIDER_SITE_OTHER): Payer: Medicare Other | Admitting: Ophthalmology

## 2011-12-30 DIAGNOSIS — H35039 Hypertensive retinopathy, unspecified eye: Secondary | ICD-10-CM

## 2011-12-30 DIAGNOSIS — I10 Essential (primary) hypertension: Secondary | ICD-10-CM

## 2011-12-30 DIAGNOSIS — H35349 Macular cyst, hole, or pseudohole, unspecified eye: Secondary | ICD-10-CM

## 2011-12-30 DIAGNOSIS — E11319 Type 2 diabetes mellitus with unspecified diabetic retinopathy without macular edema: Secondary | ICD-10-CM

## 2011-12-30 DIAGNOSIS — H43819 Vitreous degeneration, unspecified eye: Secondary | ICD-10-CM

## 2011-12-30 DIAGNOSIS — H35341 Macular cyst, hole, or pseudohole, right eye: Secondary | ICD-10-CM

## 2011-12-30 NOTE — H&P (Signed)
Joe Barry is an 78 y.o. male.   Chief Complaint: Loss of vision right eye HPI: Loss of vision right eye from macular hole.  Past Medical History  Diagnosis Date  . Hypertension   . Diabetes mellitus   . Arthritis   . BPH (benign prostatic hypertrophy)   . Cataract   . Dyslipidemia     Past Surgical History  Procedure Date  . Carpal tunnel release   . Colonoscopy 2008  . Total knee arthroplasty 2009    Left  . Sigmoidoscopy     No family history on file. Social History:  reports that he has never smoked. He has never used smokeless tobacco. He reports that he does not drink alcohol or use illicit drugs.  Allergies: No Known Allergies  Medications Prior to Admission  Medication Sig Dispense Refill  . hydrochlorothiazide (MICROZIDE) 12.5 MG capsule Take 1 capsule (12.5 mg total) by mouth daily.  30 capsule  0  . insulin glargine (LANTUS) 100 UNIT/ML injection Inject 30 Units into the skin at bedtime.        Marland Kitchen lisinopril (PRINIVIL,ZESTRIL) 40 MG tablet Take 1 tablet (40 mg total) by mouth daily.  30 tablet  0  . lovastatin (MEVACOR) 40 MG tablet TAKE ONE TABLET BY MOUTH EVERY DAY** NEEDS OFFICE VISIT BEFORE NEXT REFILLS**  30 tablet  6  . metoprolol tartrate (LOPRESSOR) 25 MG tablet Take 25 mg by mouth daily.        . pioglitazone-metformin (ACTOPLUS MET) 15-850 MG per tablet Take 1 tablet by mouth 2 (two) times daily with a meal.  60 tablet  3   No current facility-administered medications on file as of 12/30/2011.    Review of systems otherwise negative  There were no vitals taken for this visit.  Physical exam: Mental status: oriented x3. Eyes: See eye exam associated with this date of surgery in media tab.  Scanned in by scanning center Ears, Nose, Throat: within normal limits Neck: Within Normal limits General: within normal limits Chest: Within normal limits Breast: deferred Heart: Within normal limits Abdomen: Within normal limits GU: deferred Extremities:  within normal limits Skin: within normal limits  Assessment/Plan Macular hole right eye Plan: To [facility] for Pars plana vitrectomy with serum patch and gas injection right eye  DEWAN, EMOND 12/30/2011, 12:38 PM

## 2012-01-13 ENCOUNTER — Encounter (HOSPITAL_COMMUNITY): Payer: Self-pay | Admitting: Pharmacy Technician

## 2012-01-13 HISTORY — PX: PARS PLANA VITRECTOMY W/ REPAIR OF MACULAR HOLE: SHX2170

## 2012-01-21 ENCOUNTER — Encounter: Payer: Self-pay | Admitting: Family Medicine

## 2012-01-21 ENCOUNTER — Ambulatory Visit (INDEPENDENT_AMBULATORY_CARE_PROVIDER_SITE_OTHER): Payer: Medicare Other | Admitting: Family Medicine

## 2012-01-21 VITALS — BP 150/100 | HR 68 | Ht 63.0 in | Wt 191.0 lb

## 2012-01-21 DIAGNOSIS — Z23 Encounter for immunization: Secondary | ICD-10-CM

## 2012-01-21 DIAGNOSIS — E118 Type 2 diabetes mellitus with unspecified complications: Secondary | ICD-10-CM | POA: Insufficient documentation

## 2012-01-21 DIAGNOSIS — Z79899 Other long term (current) drug therapy: Secondary | ICD-10-CM

## 2012-01-21 DIAGNOSIS — E119 Type 2 diabetes mellitus without complications: Secondary | ICD-10-CM

## 2012-01-21 DIAGNOSIS — I10 Essential (primary) hypertension: Secondary | ICD-10-CM

## 2012-01-21 DIAGNOSIS — E1169 Type 2 diabetes mellitus with other specified complication: Secondary | ICD-10-CM

## 2012-01-21 DIAGNOSIS — E1159 Type 2 diabetes mellitus with other circulatory complications: Secondary | ICD-10-CM

## 2012-01-21 DIAGNOSIS — E785 Hyperlipidemia, unspecified: Secondary | ICD-10-CM

## 2012-01-21 LAB — COMPREHENSIVE METABOLIC PANEL
ALT: 9 U/L (ref 0–53)
Alkaline Phosphatase: 65 U/L (ref 39–117)
CO2: 24 mEq/L (ref 19–32)
Sodium: 137 mEq/L (ref 135–145)
Total Bilirubin: 0.5 mg/dL (ref 0.3–1.2)
Total Protein: 7.4 g/dL (ref 6.0–8.3)

## 2012-01-21 LAB — POCT GLYCOSYLATED HEMOGLOBIN (HGB A1C): Hemoglobin A1C: 9.5

## 2012-01-21 LAB — CBC WITH DIFFERENTIAL/PLATELET
Eosinophils Relative: 3 % (ref 0–5)
Hemoglobin: 12.8 g/dL — ABNORMAL LOW (ref 13.0–17.0)
Lymphocytes Relative: 29 % (ref 12–46)
Lymphs Abs: 1.5 10*3/uL (ref 0.7–4.0)
MCV: 87.7 fL (ref 78.0–100.0)
Platelets: 232 10*3/uL (ref 150–400)
RBC: 4.55 MIL/uL (ref 4.22–5.81)
WBC: 5.1 10*3/uL (ref 4.0–10.5)

## 2012-01-21 LAB — POCT UA - MICROALBUMIN
Albumin/Creatinine Ratio, Urine, POC: 95.8
Microalbumin Ur, POC: 71.2 mg/dL

## 2012-01-21 LAB — LIPID PANEL
LDL Cholesterol: 54 mg/dL (ref 0–99)
VLDL: 10 mg/dL (ref 0–40)

## 2012-01-21 MED ORDER — INSULIN GLARGINE 100 UNIT/ML ~~LOC~~ SOLN: 30.0000 [IU] | Freq: Every day | SUBCUTANEOUS | Status: DC

## 2012-01-21 MED ORDER — METOPROLOL TARTRATE 25 MG PO TABS: 25.0000 mg | ORAL_TABLET | Freq: Every day | ORAL | Status: DC

## 2012-01-21 MED ORDER — LOVASTATIN 40 MG PO TABS: 40.0000 mg | ORAL_TABLET | Freq: Every day | ORAL | Status: DC

## 2012-01-21 MED ORDER — PIOGLITAZONE HCL-METFORMIN HCL 15-850 MG PO TABS: 1.0000 | ORAL_TABLET | Freq: Two times a day (BID) | ORAL | Status: DC

## 2012-01-21 MED ORDER — HYDROCHLOROTHIAZIDE 12.5 MG PO CAPS: 12.5000 mg | ORAL_CAPSULE | Freq: Every day | ORAL | Status: DC

## 2012-01-21 MED ORDER — LISINOPRIL 40 MG PO TABS: 40.0000 mg | ORAL_TABLET | Freq: Every day | ORAL | Status: DC

## 2012-01-21 NOTE — Patient Instructions (Signed)
Take your Lantus insulin regularly. If you run out of medications, you must let us know.

## 2012-01-21 NOTE — Progress Notes (Signed)
  Subjective:    Patient ID: Joe Barry, male    DOB: 07-Jan-1934, 78 y.o.   MRN: [id number]  HPI He is here for his annual wellness exam. Psychosocial as well as risk behaviors and ADLs were discussed. His daughter does help take care of them. She is a Engineer, civil (consulting). Past medical social and family as well as surgical history was reviewed. Fall risk and depression was also reviewed. In that life issues were also discussed. He and his daughter are in agreement with this. She will have him fill out another as directed in the near future. He sees Dr. Hazle Quant for his eyes, Dr. Audley Hose for GI issues, Dr. Lajoyce Corners for orthopedic problems. He is also here to have his medications renewed. He continues on his blood pressure medications and is having no difficulty. He did run out of Lantus and cannot give me a good reason as to why he did not get this refilled. Apparently when he takes the Lantus regularly his morning blood sugars are around 90. He also apparently ran out of his metoprolol. He is scheduled for retinal surgery. He does not complaining of chest pain, shortness of breath, weakness.  Review of Systems     Objective:   Physical Exam Alert and in no distress. Cardiac exam shows regular rhythm without murmurs gallops. Lungs are clear to auscultation.       Assessment & Plan:   1. Hypertension associated with diabetes  hydrochlorothiazide (MICROZIDE) 12.5 MG capsule, lisinopril (PRINIVIL,ZESTRIL) 40 MG tablet, metoprolol tartrate (LOPRESSOR) 25 MG tablet  2. Diabetes mellitus  POCT HgB A1C, insulin glargine (LANTUS) 100 UNIT/ML injection, pioglitazone-metformin (ACTOPLUS MET) 15-850 MG per tablet, POCT UA - Microalbumin  3. Hyperlipidemia LDL goal <70  lovastatin (MEVACOR) 40 MG tablet, Lipid panel  4. Encounter for long-term (current) use of other medications  CBC with Differential, Comprehensive metabolic panel, Lipid panel  5. Need for prophylactic vaccination against Streptococcus pneumoniae  (pneumococcus)  Pneumococcal polysaccharide vaccine 23-valent greater than or equal to 2yo subcutaneous/IM  6. Need for prophylactic vaccination and inoculation against unspecified single disease  Tdap vaccine greater than or equal to 7yo IM   his medications were renewed as listed above. Discussed the fact that he needs to stay on his Lantus. Recheck here in several months.

## 2012-01-25 ENCOUNTER — Other Ambulatory Visit: Payer: Self-pay

## 2012-01-25 MED ORDER — INSULIN GLARGINE 100 UNIT/ML ~~LOC~~ SOLN: 30.0000 [IU] | Freq: Every day | SUBCUTANEOUS | Status: DC

## 2012-01-25 NOTE — Telephone Encounter (Signed)
Daughter called and said pt uses solostar

## 2012-01-27 ENCOUNTER — Encounter (HOSPITAL_COMMUNITY): Payer: Self-pay | Admitting: *Deleted

## 2012-01-27 MED ORDER — TROPICAMIDE 1 % OP SOLN
1.0000 [drp] | OPHTHALMIC | Status: DC | PRN
  Filled 2012-01-27: qty 3

## 2012-01-27 MED ORDER — GATIFLOXACIN 0.5 % OP SOLN
1.0000 [drp] | OPHTHALMIC | Status: DC | PRN
  Filled 2012-01-27: qty 2.5

## 2012-01-27 MED ORDER — CYCLOPENTOLATE HCL 1 % OP SOLN
1.0000 [drp] | OPHTHALMIC | Status: DC | PRN
  Filled 2012-01-27: qty 2

## 2012-01-27 MED ORDER — PHENYLEPHRINE HCL 2.5 % OP SOLN
1.0000 [drp] | OPHTHALMIC | Status: DC | PRN
  Filled 2012-01-27: qty 3

## 2012-01-27 MED ORDER — CEFAZOLIN SODIUM-DEXTROSE 2-3 GM-% IV SOLR
2.0000 g | INTRAVENOUS | Status: DC
  Filled 2012-01-27: qty 50

## 2012-01-28 ENCOUNTER — Encounter (HOSPITAL_COMMUNITY): Payer: Self-pay | Admitting: Vascular Surgery

## 2012-01-28 ENCOUNTER — Encounter (HOSPITAL_COMMUNITY): Payer: Self-pay | Admitting: *Deleted

## 2012-01-28 ENCOUNTER — Encounter (HOSPITAL_COMMUNITY): Payer: Self-pay | Admitting: General Practice

## 2012-01-28 ENCOUNTER — Ambulatory Visit (HOSPITAL_COMMUNITY): Payer: Medicare Other | Admitting: Vascular Surgery

## 2012-01-28 ENCOUNTER — Ambulatory Visit (HOSPITAL_COMMUNITY)
Admission: RE | Admit: 2012-01-28 | Discharge: 2012-01-29 | Disposition: A | Discharge status: Home / Self Care | Payer: Medicare Other | Source: Ambulatory Visit | Attending: Ophthalmology | Admitting: Ophthalmology

## 2012-01-28 ENCOUNTER — Encounter (HOSPITAL_COMMUNITY)
Admission: RE | Disposition: A | Discharge status: Home / Self Care | Payer: Self-pay | Source: Ambulatory Visit | Attending: Ophthalmology

## 2012-01-28 DIAGNOSIS — Z794 Long term (current) use of insulin: Secondary | ICD-10-CM | POA: Insufficient documentation

## 2012-01-28 DIAGNOSIS — H35341 Macular cyst, hole, or pseudohole, right eye: Secondary | ICD-10-CM

## 2012-01-28 DIAGNOSIS — H35349 Macular cyst, hole, or pseudohole, unspecified eye: Secondary | ICD-10-CM | POA: Insufficient documentation

## 2012-01-28 DIAGNOSIS — H33329 Round hole, unspecified eye: Secondary | ICD-10-CM

## 2012-01-28 DIAGNOSIS — E119 Type 2 diabetes mellitus without complications: Secondary | ICD-10-CM | POA: Insufficient documentation

## 2012-01-28 DIAGNOSIS — I10 Essential (primary) hypertension: Secondary | ICD-10-CM | POA: Insufficient documentation

## 2012-01-28 HISTORY — DX: Reserved for inherently not codable concepts without codable children: IMO0001

## 2012-01-28 HISTORY — PX: PARS PLANA VITRECTOMY W/ SCLERAL BUCKLE: SHX2171

## 2012-01-28 HISTORY — DX: Procedure and treatment not carried out because of patient's decision for reasons of belief and group pressure: Z53.1

## 2012-01-28 HISTORY — PX: GAS/FLUID EXCHANGE: SHX5334

## 2012-01-28 HISTORY — DX: Other specified health status: Z78.9

## 2012-01-28 LAB — GLUCOSE, CAPILLARY: Glucose-Capillary: 195 mg/dL — ABNORMAL HIGH (ref 70–99)

## 2012-01-28 LAB — AUTOLOGOUS SERUM PATCH PREP

## 2012-01-28 SURGERY — PARS PLANA VITRECTOMY WITH LASER FOR MACULAR HOLE
Anesthesia: General | Site: Eye | Laterality: Right | Wound class: Clean

## 2012-01-28 MED ORDER — EPHEDRINE SULFATE 50 MG/ML IJ SOLN
INTRAMUSCULAR | Status: DC | PRN
  Administered 2012-01-28 (×3): 5 mg via INTRAVENOUS

## 2012-01-28 MED ORDER — 0.9 % SODIUM CHLORIDE (POUR BTL) OPTIME
TOPICAL | Status: DC | PRN
  Administered 2012-01-28: 1000 mL

## 2012-01-28 MED ORDER — MUPIROCIN 2 % EX OINT
TOPICAL_OINTMENT | Freq: Two times a day (BID) | CUTANEOUS | Status: DC
  Administered 2012-01-28: 10:00:00 via NASAL

## 2012-01-28 MED ORDER — PHENYLEPHRINE HCL 2.5 % OP SOLN
1.0000 [drp] | OPHTHALMIC | Status: AC | PRN
  Administered 2012-01-28 (×3): 1 [drp] via OPHTHALMIC

## 2012-01-28 MED ORDER — LATANOPROST 0.005 % OP SOLN
1.0000 [drp] | Freq: Every day | OPHTHALMIC | Status: DC
  Filled 2012-01-28 (×2): qty 2.5

## 2012-01-28 MED ORDER — MAGNESIUM HYDROXIDE 400 MG/5ML PO SUSP: 15.0000 mL | Freq: Four times a day (QID) | ORAL | Status: DC | PRN

## 2012-01-28 MED ORDER — ATROPINE SULFATE 1 % OP SOLN
OPHTHALMIC | Status: DC | PRN
  Administered 2012-01-28: 2 [drp] via OPHTHALMIC

## 2012-01-28 MED ORDER — DROPERIDOL 2.5 MG/ML IJ SOLN: 0.6250 mg | INTRAMUSCULAR | Status: DC | PRN

## 2012-01-28 MED ORDER — BACITRACIN-POLYMYXIN B 500-10000 UNIT/GM OP OINT
TOPICAL_OINTMENT | OPHTHALMIC | Status: DC | PRN
  Administered 2012-01-28: 1 via OPHTHALMIC

## 2012-01-28 MED ORDER — DEXAMETHASONE SODIUM PHOSPHATE 10 MG/ML IJ SOLN
INTRAMUSCULAR | Status: DC | PRN
  Administered 2012-01-28: 10 mg

## 2012-01-28 MED ORDER — TEMAZEPAM 15 MG PO CAPS: 15.0000 mg | ORAL_CAPSULE | Freq: Every evening | ORAL | Status: DC | PRN

## 2012-01-28 MED ORDER — ACETAZOLAMIDE SODIUM 500 MG IJ SOLR
500.0000 mg | Freq: Once | INTRAMUSCULAR | Status: AC
  Administered 2012-01-29: 500 mg via INTRAVENOUS
  Filled 2012-01-28: qty 500

## 2012-01-28 MED ORDER — PROPOFOL 10 MG/ML IV EMUL
INTRAVENOUS | Status: DC | PRN
  Administered 2012-01-28: 120 mg via INTRAVENOUS

## 2012-01-28 MED ORDER — METFORMIN HCL 850 MG PO TABS
850.0000 mg | ORAL_TABLET | Freq: Two times a day (BID) | ORAL | Status: DC
  Administered 2012-01-28: 850 mg via ORAL
  Filled 2012-01-28 (×5): qty 1

## 2012-01-28 MED ORDER — TROPICAMIDE 1 % OP SOLN
1.0000 [drp] | OPHTHALMIC | Status: AC | PRN
  Administered 2012-01-28 (×3): 1 [drp] via OPHTHALMIC

## 2012-01-28 MED ORDER — MUPIROCIN 2 % EX OINT
TOPICAL_OINTMENT | CUTANEOUS | Status: AC
  Filled 2012-01-28: qty 22

## 2012-01-28 MED ORDER — CYCLOPENTOLATE HCL 1 % OP SOLN
1.0000 [drp] | OPHTHALMIC | Status: AC | PRN
  Administered 2012-01-28 (×3): 1 [drp] via OPHTHALMIC

## 2012-01-28 MED ORDER — HYDROCHLOROTHIAZIDE 25 MG PO TABS
12.5000 mg | ORAL_TABLET | Freq: Every day | ORAL | Status: DC
  Filled 2012-01-28: qty 0.5

## 2012-01-28 MED ORDER — INSULIN ASPART 100 UNIT/ML ~~LOC~~ SOLN
0.0000 [IU] | SUBCUTANEOUS | Status: DC
  Administered 2012-01-28: 5 [IU] via SUBCUTANEOUS
  Administered 2012-01-28: 2 [IU] via SUBCUTANEOUS
  Administered 2012-01-29: 3 [IU] via SUBCUTANEOUS
  Administered 2012-01-29: 1 [IU] via SUBCUTANEOUS

## 2012-01-28 MED ORDER — BSS PLUS IO SOLN
INTRAOCULAR | Status: DC | PRN
  Administered 2012-01-28: 1 via INTRAOCULAR

## 2012-01-28 MED ORDER — ACETAMINOPHEN 325 MG PO TABS: 325.0000 mg | ORAL_TABLET | ORAL | Status: DC | PRN

## 2012-01-28 MED ORDER — LISINOPRIL 40 MG PO TABS
40.0000 mg | ORAL_TABLET | Freq: Every day | ORAL | Status: DC
  Filled 2012-01-28: qty 1

## 2012-01-28 MED ORDER — BSS IO SOLN
INTRAOCULAR | Status: DC | PRN
  Administered 2012-01-28: 500 mL via INTRAOCULAR

## 2012-01-28 MED ORDER — BUPIVACAINE HCL 0.75 % IJ SOLN
INTRAMUSCULAR | Status: DC | PRN
  Administered 2012-01-28: 10 mL

## 2012-01-28 MED ORDER — ZOLPIDEM TARTRATE 5 MG PO TABS: 5.0000 mg | ORAL_TABLET | Freq: Every evening | ORAL | Status: DC | PRN

## 2012-01-28 MED ORDER — ONDANSETRON HCL 4 MG/2ML IJ SOLN: 4.0000 mg | Freq: Four times a day (QID) | INTRAMUSCULAR | Status: DC

## 2012-01-28 MED ORDER — HEMOSTATIC AGENTS (NO CHARGE) OPTIME
TOPICAL | Status: DC | PRN
  Administered 2012-01-28: 1 via TOPICAL

## 2012-01-28 MED ORDER — INSULIN GLARGINE 100 UNIT/ML ~~LOC~~ SOLN
30.0000 [IU] | Freq: Every day | SUBCUTANEOUS | Status: DC
  Administered 2012-01-28: 30 [IU] via SUBCUTANEOUS

## 2012-01-28 MED ORDER — BRIMONIDINE TARTRATE 0.2 % OP SOLN
1.0000 [drp] | Freq: Two times a day (BID) | OPHTHALMIC | Status: DC
  Filled 2012-01-28 (×2): qty 5

## 2012-01-28 MED ORDER — OXYCODONE-ACETAMINOPHEN 5-325 MG PO TABS: 1.0000 | ORAL_TABLET | ORAL | Status: DC | PRN

## 2012-01-28 MED ORDER — SODIUM CHLORIDE 0.9 % IJ SOLN
INTRAMUSCULAR | Status: DC | PRN
  Administered 2012-01-28: 11:00:00

## 2012-01-28 MED ORDER — SODIUM CHLORIDE 0.9 % IV SOLN
INTRAVENOUS | Status: DC | PRN
  Administered 2012-01-28 (×3): via INTRAVENOUS

## 2012-01-28 MED ORDER — ONDANSETRON HCL 4 MG/2ML IJ SOLN
INTRAMUSCULAR | Status: DC | PRN
  Administered 2012-01-28: 4 mg via INTRAVENOUS

## 2012-01-28 MED ORDER — MINERAL OIL LIGHT 100 % EX OIL
TOPICAL_OIL | CUTANEOUS | Status: DC | PRN
  Administered 2012-01-28: 1 via TOPICAL

## 2012-01-28 MED ORDER — MIDAZOLAM HCL 5 MG/5ML IJ SOLN
INTRAMUSCULAR | Status: DC | PRN
  Administered 2012-01-28: 1 mg via INTRAVENOUS

## 2012-01-28 MED ORDER — FENTANYL CITRATE 0.05 MG/ML IJ SOLN
INTRAMUSCULAR | Status: DC | PRN
  Administered 2012-01-28: 75 ug via INTRAVENOUS
  Administered 2012-01-28: 25 ug via INTRAVENOUS
  Administered 2012-01-28: 50 ug via INTRAVENOUS

## 2012-01-28 MED ORDER — SODIUM CHLORIDE 0.45 % IV SOLN
INTRAVENOUS | Status: DC
  Administered 2012-01-28: 15:00:00 via INTRAVENOUS

## 2012-01-28 MED ORDER — PREDNISOLONE ACETATE 1 % OP SUSP
1.0000 [drp] | Freq: Four times a day (QID) | OPHTHALMIC | Status: DC
  Filled 2012-01-28: qty 5
  Filled 2012-01-28: qty 1

## 2012-01-28 MED ORDER — EPINEPHRINE HCL 1 MG/ML IJ SOLN
INTRAOCULAR | Status: DC | PRN
  Administered 2012-01-28: 11:00:00

## 2012-01-28 MED ORDER — TETRACAINE HCL 0.5 % OP SOLN
2.0000 [drp] | Freq: Once | OPHTHALMIC | Status: DC
  Filled 2012-01-28: qty 2

## 2012-01-28 MED ORDER — METOPROLOL TARTRATE 25 MG PO TABS
25.0000 mg | ORAL_TABLET | Freq: Every day | ORAL | Status: DC
Start: 2012-01-29 — End: 2012-01-29
  Filled 2012-01-28: qty 1

## 2012-01-28 MED ORDER — PHENOL 1.4 % MT LIQD
1.0000 | OROMUCOSAL | Status: DC | PRN
  Filled 2012-01-28: qty 177

## 2012-01-28 MED ORDER — GATIFLOXACIN 0.5 % OP SOLN
1.0000 [drp] | Freq: Four times a day (QID) | OPHTHALMIC | Status: DC
  Filled 2012-01-28: qty 2.5

## 2012-01-28 MED ORDER — SODIUM HYALURONATE 10 MG/ML IO SOLN
INTRAOCULAR | Status: DC | PRN
  Administered 2012-01-28: 0.85 mL via INTRAOCULAR

## 2012-01-28 MED ORDER — GATIFLOXACIN 0.5 % OP SOLN
1.0000 [drp] | OPHTHALMIC | Status: AC | PRN
  Administered 2012-01-28 (×3): 1 [drp] via OPHTHALMIC

## 2012-01-28 MED ORDER — SIMVASTATIN 40 MG PO TABS
40.0000 mg | ORAL_TABLET | Freq: Every day | ORAL | Status: DC
  Administered 2012-01-28: 40 mg via ORAL
  Filled 2012-01-28 (×2): qty 1

## 2012-01-28 MED ORDER — HYDROMORPHONE HCL PF 1 MG/ML IJ SOLN: 0.2500 mg | INTRAMUSCULAR | Status: DC | PRN

## 2012-01-28 MED ORDER — LIDOCAINE HCL (CARDIAC) 20 MG/ML IV SOLN
INTRAVENOUS | Status: DC | PRN
  Administered 2012-01-28: 60 mg via INTRAVENOUS

## 2012-01-28 MED ORDER — SODIUM CHLORIDE 0.9 % IV SOLN
INTRAVENOUS | Status: DC
  Administered 2012-01-28: 11:00:00 via INTRAVENOUS

## 2012-01-28 MED ORDER — ROCURONIUM BROMIDE 100 MG/10ML IV SOLN
INTRAVENOUS | Status: DC | PRN
  Administered 2012-01-28: 50 mg via INTRAVENOUS

## 2012-01-28 MED ORDER — NEOSTIGMINE METHYLSULFATE 1 MG/ML IJ SOLN
INTRAMUSCULAR | Status: DC | PRN
  Administered 2012-01-28: 5 mg via INTRAVENOUS

## 2012-01-28 MED ORDER — PIOGLITAZONE HCL 15 MG PO TABS
15.0000 mg | ORAL_TABLET | Freq: Two times a day (BID) | ORAL | Status: DC
  Administered 2012-01-28: 15 mg via ORAL
  Filled 2012-01-28 (×5): qty 1

## 2012-01-28 MED ORDER — BACITRACIN-POLYMYXIN B 500-10000 UNIT/GM OP OINT
1.0000 "application " | TOPICAL_OINTMENT | Freq: Four times a day (QID) | OPHTHALMIC | Status: DC
  Filled 2012-01-28: qty 3.5

## 2012-01-28 MED ORDER — MORPHINE SULFATE 2 MG/ML IJ SOLN: 1.0000 mg | INTRAMUSCULAR | Status: DC | PRN

## 2012-01-28 MED ORDER — GLYCOPYRROLATE 0.2 MG/ML IJ SOLN
INTRAMUSCULAR | Status: DC | PRN
  Administered 2012-01-28: 0.6 mg via INTRAVENOUS

## 2012-01-28 SURGICAL SUPPLY — 66 items
ACCESSORY FRAGMATOME (MISCELLANEOUS) IMPLANT
APPLICATOR DR MATTHEWS STRL (MISCELLANEOUS) IMPLANT
BLADE EYE CATARACT 19 1.4 BEAV (BLADE) IMPLANT
BLADE MVR KNIFE 19G (BLADE) ×3 IMPLANT
CANNULA ANT CHAM MAIN (OPHTHALMIC RELATED) IMPLANT
CANNULA FLEX TIP 25G (CANNULA) ×6 IMPLANT
CANNULA SUBRETINAL FLUID 20G (BLADE) ×3 IMPLANT
CLOTH BEACON ORANGE TIMEOUT ST (SAFETY) ×3 IMPLANT
CORDS BIPOLAR (ELECTRODE) ×3 IMPLANT
COTTONBALL LRG STERILE PKG (GAUZE/BANDAGES/DRESSINGS) ×9 IMPLANT
COVER MAYO STAND STRL (DRAPES) IMPLANT
DRAPE INCISE 51X51 W/FILM STRL (DRAPES) ×3 IMPLANT
DRAPE OPHTHALMIC 77X100 STRL (CUSTOM PROCEDURE TRAY) ×3 IMPLANT
EAGLE VIT/RET MICRO PIC 168 25 (MISCELLANEOUS) IMPLANT
ERASER HMR WETFIELD 23G BP (MISCELLANEOUS) IMPLANT
FILTER BLUE MILLIPORE (MISCELLANEOUS) ×6 IMPLANT
FILTER STRAW FLUID ASPIR (MISCELLANEOUS) ×3 IMPLANT
GAS OPHTHALMIC (MISCELLANEOUS) ×3 IMPLANT
GLOVE SKINSENSE NS SZ6.5 (GLOVE) ×1
GLOVE SKINSENSE STRL SZ6.5 (GLOVE) ×2 IMPLANT
GLOVE SS BIOGEL STRL SZ 6.5 (GLOVE) ×2 IMPLANT
GLOVE SS BIOGEL STRL SZ 7 (GLOVE) ×2 IMPLANT
GLOVE SUPERSENSE BIOGEL SZ 6.5 (GLOVE) ×1
GLOVE SUPERSENSE BIOGEL SZ 7 (GLOVE) ×1
GLOVE SURG 8.5 LATEX PF (GLOVE) ×3 IMPLANT
GOWN STRL NON-REIN LRG LVL3 (GOWN DISPOSABLE) ×9 IMPLANT
ILLUMINATOR CHOW PICK 25GA (MISCELLANEOUS) ×3 IMPLANT
KIT BASIN OR (CUSTOM PROCEDURE TRAY) ×3 IMPLANT
KIT ROOM TURNOVER OR (KITS) ×3 IMPLANT
KNIFE CRESCENT 1.75 EDGEAHEAD (BLADE) ×3 IMPLANT
KNIFE GRIESHABER SHARP 2.5MM (MISCELLANEOUS) IMPLANT
MARKER SKIN DUAL TIP RULER LAB (MISCELLANEOUS) IMPLANT
MASK EYE SHIELD (GAUZE/BANDAGES/DRESSINGS) ×3 IMPLANT
NEEDLE 18GX1X1/2 (RX/OR ONLY) (NEEDLE) ×3 IMPLANT
NEEDLE 25GX 5/8IN NON SAFETY (NEEDLE) ×3 IMPLANT
NEEDLE 27GAX1X1/2 (NEEDLE) IMPLANT
NEEDLE HYPO 30X.5 LL (NEEDLE) ×6 IMPLANT
NS IRRIG 1000ML POUR BTL (IV SOLUTION) ×3 IMPLANT
PACK VITRECTOMY CUSTOM (CUSTOM PROCEDURE TRAY) ×3 IMPLANT
PACK VITRECTOMY PIC MCHSVP (PACKS) IMPLANT
PAD ARMBOARD 7.5X6 YLW CONV (MISCELLANEOUS) ×6 IMPLANT
PAD EYE OVAL STERILE LF (GAUZE/BANDAGES/DRESSINGS) ×3 IMPLANT
PAK VITRECTOMY PIK 25 GA (OPHTHALMIC RELATED) IMPLANT
PROBE DIRECTIONAL LASER (MISCELLANEOUS) IMPLANT
REPL STRA BRUSH NEEDLE (NEEDLE) ×3 IMPLANT
RESERVOIR BACK FLUSH (MISCELLANEOUS) ×3 IMPLANT
ROLLS DENTAL (MISCELLANEOUS) ×6 IMPLANT
SCRAPER DIAMOND 25GA (OPHTHALMIC RELATED) ×3 IMPLANT
SPONGE SURGIFOAM ABS GEL 12-7 (HEMOSTASIS) ×3 IMPLANT
STOPCOCK 4 WAY LG BORE MALE ST (IV SETS) IMPLANT
SUT CHROMIC 7 0 TG140 8 (SUTURE) IMPLANT
SUT ETHILON 9 0 TG140 8 (SUTURE) ×3 IMPLANT
SUT POLY NON ABSORB 10-0 8 STR (SUTURE) IMPLANT
SUT SILK 4 0 RB 1 (SUTURE) IMPLANT
SWAB COLLECTION DEVICE MRSA (MISCELLANEOUS) IMPLANT
SYR 20CC LL (SYRINGE) ×3 IMPLANT
SYR 5ML LL (SYRINGE) IMPLANT
SYR BULB 3OZ (MISCELLANEOUS) ×3 IMPLANT
SYR TB 1ML LUER SLIP (SYRINGE) ×6 IMPLANT
SYRINGE 10CC LL (SYRINGE) ×3 IMPLANT
TAPE SURG TRANSPORE 1 IN (GAUZE/BANDAGES/DRESSINGS) ×2 IMPLANT
TAPE SURGICAL TRANSPORE 1 IN (GAUZE/BANDAGES/DRESSINGS) ×1
TOWEL OR 17X24 6PK STRL BLUE (TOWEL DISPOSABLE) ×9 IMPLANT
TUBE ANAEROBIC SPECIMEN COL (MISCELLANEOUS) IMPLANT
WATER STERILE IRR 1000ML POUR (IV SOLUTION) ×3 IMPLANT
WIPE INSTRUMENT VISIWIPE 73X73 (MISCELLANEOUS) ×3 IMPLANT

## 2012-01-28 NOTE — Anesthesia Preprocedure Evaluation (Addendum)
Anesthesia Evaluation  Patient identified by MRN, date of birth, ID band Patient awake    Reviewed: Allergy & Precautions, H&P , NPO status , Patient's Chart, lab work & pertinent test results, reviewed documented beta blocker date and time   History of Anesthesia Complications Negative for: history of anesthetic complications  Airway Mallampati: II TM Distance: >3 FB Neck ROM: Full    Dental  (+) Missing, Dental Advisory Given and Chipped   Pulmonary neg pulmonary ROS,  breath sounds clear to auscultation  Pulmonary exam normal       Cardiovascular hypertension, Pt. on medications and Pt. on home beta blockers Rhythm:Regular Rate:Normal     Neuro/Psych    GI/Hepatic negative GI ROS, Neg liver ROS,   Endo/Other  Diabetes mellitus-, Poorly Controlled, Type 2, Insulin Dependent  Renal/GU negative Renal ROS     Musculoskeletal   Abdominal   Peds  Hematology   Anesthesia Other Findings   Reproductive/Obstetrics                          Anesthesia Physical Anesthesia Plan  ASA: III  Anesthesia Plan: General   Post-op Pain Management:    Induction: Intravenous  Airway Management Planned: Oral ETT  Additional Equipment:   Intra-op Plan:   Post-operative Plan: Extubation in OR  Informed Consent: I have reviewed the patients History and Physical, chart, labs and discussed the procedure including the risks, benefits and alternatives for the proposed anesthesia with the patient or authorized representative who has indicated his/her understanding and acceptance.   Dental advisory given  Plan Discussed with: CRNA, Anesthesiologist and Surgeon  Anesthesia Plan Comments:         Anesthesia Quick Evaluation

## 2012-01-28 NOTE — Preoperative (Signed)
Beta Blockers   Reason not to administer Beta Blockers:Not Applicable 

## 2012-01-28 NOTE — Transfer of Care (Signed)
Immediate Anesthesia Transfer of Care Note  Patient: Joe Barry  Procedure(s) Performed: Procedure(s) (LRB): PARS PLANA VITRECTOMY WITH LASER FOR MACULAR HOLE (Right) SERUM PATCH (Right) GAS/FLUID EXCHANGE (Right) MEMBRANE PEEL (Right)  Patient Location: PACU  Anesthesia Type: General  Level of Consciousness: awake and alert   Airway & Oxygen Therapy: Patient Spontanous Breathing and Patient connected to nasal cannula oxygen  Post-op Assessment: Report given to PACU RN  Post vital signs: Reviewed  Complications: No apparent anesthesia complications

## 2012-01-28 NOTE — H&P (Signed)
I examined the patient today and there is no change in the medical status 

## 2012-01-28 NOTE — Anesthesia Postprocedure Evaluation (Signed)
Anesthesia Post Note  Patient: Joe Barry  Procedure(s) Performed: Procedure(s) (LRB): PARS PLANA VITRECTOMY WITH LASER FOR MACULAR HOLE (Right) SERUM PATCH (Right) GAS/FLUID EXCHANGE (Right) MEMBRANE PEEL (Right)  Anesthesia type: general  Patient location: PACU  Post pain: Pain level controlled  Post assessment: Patient's Cardiovascular Status Stable  Last Vitals:  Filed Vitals:   01/28/12 1415  BP:   Pulse: 66  Temp:   Resp: 13    Post vital signs: Reviewed and stable  Level of consciousness: sedated  Complications: No apparent anesthesia complications

## 2012-01-28 NOTE — Procedures (Signed)
Brief Operative note   Preoperative diagnosis:  Pre-Op Diagnosis Codes:    * Macular cyst, hole, or pseudohole of retina [362.54] Postoperative diagnosis  Post-Op Diagnosis Codes:    * Macular cyst, hole, or pseudohole of retina [362.54]  Procedures: Repair of macular hole with pars plana vitrectomy, laser treatment, gas injection, serum patch right eye  Surgeon:  Sherrie George, MD...  Assistant:  Rosalie Doctor SA   Anesthesia: General  Specimen: none  Estimated blood loss:  1cc  Complications: none  Patient sent to PACU in good condition  Composed by Sherrie George MD  Dictation number: [phone]

## 2012-01-28 NOTE — Progress Notes (Signed)
This encounter was created in error - please disregard.

## 2012-01-28 NOTE — Anesthesia Procedure Notes (Signed)
Procedure Name: Intubation Date/Time: 01/28/2012 11:31 AM Performed by: Lovie Chol Pre-anesthesia Checklist: Patient identified, Emergency Drugs available, Suction available, Patient being monitored and Timeout performed Patient Re-evaluated:Patient Re-evaluated prior to inductionOxygen Delivery Method: Circle system utilized Preoxygenation: Pre-oxygenation with 100% oxygen Intubation Type: IV induction Ventilation: Mask ventilation without difficulty, Oral airway inserted - appropriate to patient size and Two handed mask ventilation required Laryngoscope Size: Miller and 2 Grade View: Grade II Tube type: Oral Tube size: 7.5 mm Number of attempts: 1 Airway Equipment and Method: Stylet and LTA kit utilized Placement Confirmation: ETT inserted through vocal cords under direct vision,  positive ETCO2 and breath sounds checked- equal and bilateral Secured at: 22 cm Tube secured with: Tape Dental Injury: Teeth and Oropharynx as per pre-operative assessment

## 2012-01-29 ENCOUNTER — Encounter (HOSPITAL_COMMUNITY): Payer: Self-pay | Admitting: Ophthalmology

## 2012-01-29 LAB — GLUCOSE, CAPILLARY
Glucose-Capillary: 130 mg/dL — ABNORMAL HIGH (ref 70–99)
Glucose-Capillary: 210 mg/dL — ABNORMAL HIGH (ref 70–99)
Glucose-Capillary: 228 mg/dL — ABNORMAL HIGH (ref 70–99)
Glucose-Capillary: 94 mg/dL (ref 70–99)

## 2012-01-29 MED ORDER — PREDNISOLONE ACETATE 1 % OP SUSP: 1.0000 [drp] | Freq: Four times a day (QID) | OPHTHALMIC | Status: AC

## 2012-01-29 MED ORDER — BACITRACIN-POLYMYXIN B 500-10000 UNIT/GM OP OINT: 1.0000 "application " | TOPICAL_OINTMENT | Freq: Four times a day (QID) | OPHTHALMIC | Status: AC

## 2012-01-29 MED ORDER — GATIFLOXACIN 0.5 % OP SOLN: 1.0000 [drp] | Freq: Four times a day (QID) | OPHTHALMIC | Status: DC

## 2012-01-29 MED ORDER — BRIMONIDINE TARTRATE 0.2 % OP SOLN: 1.0000 [drp] | Freq: Two times a day (BID) | OPHTHALMIC | Status: AC

## 2012-01-29 NOTE — Discharge Summary (Signed)
Discharge summary not needed on [facility] patients per medical records. 

## 2012-01-29 NOTE — Progress Notes (Signed)
01/29/2012, 6:51 AM  Mental Status:  Awake, Alert, Oriented  Anterior segment: Cornea  Clear    Anterior Chamber Clear    Lens:   IOL  Intra Ocular Pressure 23 mmHg with Tonopen  Vitreous: Clear 90%gas bubble   Retina:  Attached Good laser reaction   Impression: Excellent result Retina attached   Final Diagnosis: Macular hole right eye   Plan: start post operative eye drops.  Discharge to home.  Give post operative instructions  Joe, Barry 01/29/2012, 6:51 AM

## 2012-01-29 NOTE — Progress Notes (Signed)
Pt voiding only small amount each time.  Performed bladder scan, over 800 cc in bladder.  Discussed in and out cath with patient-1050 cc of urine out.  Small amount of blood when initially inserting catheter-pt tolerated procedure well.  Will continue to monitor.

## 2012-01-29 NOTE — Op Note (Signed)
NAME:  SHEFFIELD, HAWKER NO.:  [id number]  MEDICAL RECORD NO.:  [id number]  LOCATION:  [facility]                         FACILITY:  [facility]  PHYSICIAN:  Kery Batzel. Ashley Royalty, M.D. DATE OF BIRTH:  1934/02/18  DATE OF PROCEDURE:  01/28/2012 DATE OF DISCHARGE:                              OPERATIVE REPORT   ADMISSION DIAGNOSIS:  Macular hole, stage IV, right eye.  PROCEDURES:  Repair of macular hole with pars plana vitrectomy, membrane peel, retinal photocoagulation, serum patch, gas-fluid exchange, all in the right eye.  SURGEON:  Gale Hulse. Ashley Royalty, M.D.  ASSISTANT:  Rosalie Doctor, SA.  ANESTHESIA:  General.  DETAILS:  Usual prep and drape, peritomies at 8, 10, and 2 o'clock. Pars plana vitrectomy begun just behind the pseudophakos, some capsular remnants were removed, and some cortical remnants were removed.  The vitrectomy was carried posteriorly in a core fashion down to the macular surface.  A large cookie cutter, the macular hole was seen with an elevated edge stage IV.  The vitrectomy was carried into the mid periphery where surface proliferation was removed from the retinal surface.  The silicone-tip suction line was drawn down to the macular surface, but the fish-strike sign did not occur.  The magnifying contact lens was placed and diamond-dusted membrane scraper was used to peel the internal limiting membrane and removed it from its attachments to the edge of the hole.  Several small areas of capillary hemorrhage were seen.  The floating operculum was removed with the vitreous cutter.  The vitrectomy was carried down to the far periphery.  The 30-degree prismatic lens was used for viewing in this area.  The vitreous base was trimmed for 360 degrees.  The indirect ophthalmoscope laser was moved into place 644 burns were placed around the retinal periphery.  The power was between 200 and 300 mW, 1000 microns each and 0.1 seconds each.  Once the internal limiting  membrane was removed, a gas-fluid exchange was performed with suction over the optic nerve.  Sufficient time was allowed for additional fluid to track down the walls of the eye collect in the posterior segment.  C3F8 in 15% concentration was prepared.  A serum patch was prepared.  Additional fluid was removed with the silicone-tip suction line.  Serum patch was delivered.  C3F8 15% was exchanged for intravitreal gas.  The 25-gauge trocars were removed one at this time.  The wounds were tested and they were found to be secured.  Polymyxin and gentamicin were irrigated into tenon space. Atropine solution was applied.  Marcaine was injected around the globe for postop pain.  Decadron 10 mg was injected into the lower subconjunctival space.  The closing pressure was 10 with a Barraquer tonometer.  Polysporin patch and shield were placed.  The patient was awakened, taken to recovery in satisfactory condition.  COMPLICATIONS:  None.  DURATION:  1 hour.     Beulah Gandy. Ashley Royalty, M.D.     JDM/MEDQ  D:  01/28/2012  T:  01/28/2012  Job:  [id number]

## 2012-01-29 NOTE — Discharge Planning (Signed)
Patient discharged home in stable condition. Verbalizes understanding of all discharge instructions, including home medications and follow up appointments. 

## 2012-02-04 ENCOUNTER — Inpatient Hospital Stay (INDEPENDENT_AMBULATORY_CARE_PROVIDER_SITE_OTHER): Payer: Medicare Other | Admitting: Ophthalmology

## 2012-02-04 DIAGNOSIS — H35349 Macular cyst, hole, or pseudohole, unspecified eye: Secondary | ICD-10-CM

## 2012-03-01 ENCOUNTER — Encounter (INDEPENDENT_AMBULATORY_CARE_PROVIDER_SITE_OTHER): Payer: Medicare Other | Admitting: Ophthalmology

## 2012-03-01 DIAGNOSIS — H35349 Macular cyst, hole, or pseudohole, unspecified eye: Secondary | ICD-10-CM

## 2012-03-10 ENCOUNTER — Ambulatory Visit
Admission: RE | Admit: 2012-03-10 | Discharge: 2012-03-10 | Disposition: A | Discharge status: Home / Self Care | Payer: Medicare Other | Source: Ambulatory Visit | Attending: Family Medicine | Admitting: Family Medicine

## 2012-03-10 ENCOUNTER — Ambulatory Visit (INDEPENDENT_AMBULATORY_CARE_PROVIDER_SITE_OTHER): Payer: Medicare Other | Admitting: Family Medicine

## 2012-03-10 ENCOUNTER — Encounter: Payer: Self-pay | Admitting: Family Medicine

## 2012-03-10 VITALS — BP 120/80 | HR 77

## 2012-03-10 DIAGNOSIS — M25461 Effusion, right knee: Secondary | ICD-10-CM

## 2012-03-10 DIAGNOSIS — M25469 Effusion, unspecified knee: Secondary | ICD-10-CM

## 2012-03-10 LAB — URIC ACID: Uric Acid, Serum: 5.9 mg/dL (ref 4.0–7.8)

## 2012-03-10 MED ORDER — COLCHICINE 0.6 MG PO TABS: 0.6000 mg | ORAL_TABLET | Freq: Two times a day (BID) | ORAL | Status: DC

## 2012-03-10 NOTE — Patient Instructions (Signed)
You can also take 4 ibuprofen 3 times per day

## 2012-03-10 NOTE — Progress Notes (Signed)
  Subjective:    Patient ID: Joe Barry, male    DOB: 06/20/1934, 78 y.o.   MRN: [id number]  HPI He states that 2 weeks ago he was hurting all over however in the last week the pain is settled in the right hip and leg. He has no history of injuries but has had several falls recently that he blames on his pain. He has no other joint involvement. He continues on medications listed in the chart.   Review of Systems     Objective:   Physical Exam Alert and in no distress sitting in a wheelchair. Hip motion does not cause pain knee motion does cause pain. It was difficult to examine as needed his pants being on but he is warm and tender to touch with pain on motion. Small effusion is noted.       Assessment & Plan:  Right knee pain probable gout. I will get a uric acid level and do an x-ray. Will also give Colcrys with instructions on proper use.

## 2012-03-11 ENCOUNTER — Telehealth: Payer: Self-pay

## 2012-03-11 NOTE — Telephone Encounter (Signed)
Make a note to call him on Monday

## 2012-03-11 NOTE — Telephone Encounter (Signed)
Called pt per JCl to see how he was doing he said some better he said lets see how this weekend goes and THANK YOU FOR CALLING

## 2012-05-04 ENCOUNTER — Other Ambulatory Visit: Payer: Self-pay

## 2012-05-04 MED ORDER — COLCHICINE 0.6 MG PO TABS: 0.6000 mg | ORAL_TABLET | Freq: Two times a day (BID) | ORAL | Status: DC

## 2012-05-04 NOTE — Telephone Encounter (Signed)
Joe Barry informed of med being called in

## 2012-05-04 NOTE — Telephone Encounter (Signed)
Pt is having gout flare up again asked if you could call in colchine again for him please advise

## 2012-05-04 NOTE — Telephone Encounter (Signed)
Let him know that I called the medication and and if he keeps having difficulty, he will need to be seen

## 2012-05-04 NOTE — Telephone Encounter (Signed)
Colchicine called in for treatment of gout symptoms

## 2012-05-05 ENCOUNTER — Other Ambulatory Visit: Payer: Self-pay

## 2012-05-05 MED ORDER — COLCHICINE 0.6 MG PO TABS: 0.6000 mg | ORAL_TABLET | Freq: Two times a day (BID) | ORAL | Status: DC

## 2012-05-10 ENCOUNTER — Encounter (INDEPENDENT_AMBULATORY_CARE_PROVIDER_SITE_OTHER): Payer: Medicare Other | Admitting: Ophthalmology

## 2012-05-10 DIAGNOSIS — I10 Essential (primary) hypertension: Secondary | ICD-10-CM

## 2012-05-10 DIAGNOSIS — H43819 Vitreous degeneration, unspecified eye: Secondary | ICD-10-CM

## 2012-05-10 DIAGNOSIS — H35039 Hypertensive retinopathy, unspecified eye: Secondary | ICD-10-CM

## 2012-05-10 DIAGNOSIS — E11319 Type 2 diabetes mellitus with unspecified diabetic retinopathy without macular edema: Secondary | ICD-10-CM

## 2012-05-10 DIAGNOSIS — H35349 Macular cyst, hole, or pseudohole, unspecified eye: Secondary | ICD-10-CM

## 2012-05-10 DIAGNOSIS — E1139 Type 2 diabetes mellitus with other diabetic ophthalmic complication: Secondary | ICD-10-CM

## 2012-07-15 ENCOUNTER — Encounter: Payer: Self-pay | Admitting: Family Medicine

## 2012-07-15 ENCOUNTER — Inpatient Hospital Stay (HOSPITAL_COMMUNITY)
Admission: EM | Admit: 2012-07-15 | Discharge: 2012-07-19 | DRG: 253 | Disposition: A | Discharge status: Home / Self Care | Payer: Medicare Other | Attending: Vascular Surgery | Admitting: Vascular Surgery

## 2012-07-15 ENCOUNTER — Encounter (HOSPITAL_COMMUNITY): Payer: Self-pay | Admitting: Family Medicine

## 2012-07-15 ENCOUNTER — Ambulatory Visit (INDEPENDENT_AMBULATORY_CARE_PROVIDER_SITE_OTHER): Payer: Medicare Other | Admitting: Family Medicine

## 2012-07-15 ENCOUNTER — Emergency Department (HOSPITAL_COMMUNITY): Payer: Medicare Other

## 2012-07-15 VITALS — BP 116/72 | HR 70 | Wt 188.0 lb

## 2012-07-15 DIAGNOSIS — Z87891 Personal history of nicotine dependence: Secondary | ICD-10-CM

## 2012-07-15 DIAGNOSIS — E785 Hyperlipidemia, unspecified: Secondary | ICD-10-CM

## 2012-07-15 DIAGNOSIS — R58 Hemorrhage, not elsewhere classified: Secondary | ICD-10-CM | POA: Diagnosis present

## 2012-07-15 DIAGNOSIS — R1904 Left lower quadrant abdominal swelling, mass and lump: Secondary | ICD-10-CM

## 2012-07-15 DIAGNOSIS — N501 Vascular disorders of male genital organs: Secondary | ICD-10-CM

## 2012-07-15 DIAGNOSIS — Z96659 Presence of unspecified artificial knee joint: Secondary | ICD-10-CM

## 2012-07-15 DIAGNOSIS — E1159 Type 2 diabetes mellitus with other circulatory complications: Secondary | ICD-10-CM

## 2012-07-15 DIAGNOSIS — N400 Enlarged prostate without lower urinary tract symptoms: Secondary | ICD-10-CM | POA: Diagnosis present

## 2012-07-15 DIAGNOSIS — I739 Peripheral vascular disease, unspecified: Secondary | ICD-10-CM

## 2012-07-15 DIAGNOSIS — R1903 Right lower quadrant abdominal swelling, mass and lump: Secondary | ICD-10-CM

## 2012-07-15 DIAGNOSIS — E119 Type 2 diabetes mellitus without complications: Secondary | ICD-10-CM

## 2012-07-15 DIAGNOSIS — Y849 Medical procedure, unspecified as the cause of abnormal reaction of the patient, or of later complication, without mention of misadventure at the time of the procedure: Secondary | ICD-10-CM | POA: Diagnosis present

## 2012-07-15 DIAGNOSIS — D62 Acute posthemorrhagic anemia: Secondary | ICD-10-CM | POA: Diagnosis not present

## 2012-07-15 DIAGNOSIS — T82898A Other specified complication of vascular prosthetic devices, implants and grafts, initial encounter: Secondary | ICD-10-CM | POA: Diagnosis present

## 2012-07-15 DIAGNOSIS — I10 Essential (primary) hypertension: Secondary | ICD-10-CM | POA: Diagnosis present

## 2012-07-15 DIAGNOSIS — I724 Aneurysm of artery of lower extremity: Principal | ICD-10-CM | POA: Diagnosis present

## 2012-07-15 LAB — COMPREHENSIVE METABOLIC PANEL
ALT: 8 U/L (ref 0–53)
BUN: 17 mg/dL (ref 6–23)
CO2: 25 mEq/L (ref 19–32)
Calcium: 9.6 mg/dL (ref 8.4–10.5)
GFR calc Af Amer: >90 mL/min (ref >90)
GFR calc non Af Amer: 83 mL/min — ABNORMAL LOW (ref >90)
Glucose, Bld: 157 mg/dL — ABNORMAL HIGH (ref 70–99)
Total Protein: 7.4 g/dL (ref 6.0–8.3)

## 2012-07-15 LAB — CBC WITH DIFFERENTIAL/PLATELET
Eosinophils Absolute: 0.1 10*3/uL (ref 0.0–0.7)
Eosinophils Relative: 1 % (ref 0–5)
HCT: 32.5 % — ABNORMAL LOW (ref 39.0–52.0)
Hemoglobin: 10.3 g/dL — ABNORMAL LOW (ref 13.0–17.0)
Lymphocytes Relative: 25 % (ref 12–46)
Lymphs Abs: 1.5 10*3/uL (ref 0.7–4.0)
MCH: 26.3 pg (ref 26.0–34.0)
MCV: 82.9 fL (ref 78.0–100.0)
Monocytes Absolute: 0.6 10*3/uL (ref 0.1–1.0)
Monocytes Relative: 10 % (ref 3–12)
RBC: 3.92 MIL/uL — ABNORMAL LOW (ref 4.22–5.81)
WBC: 6 10*3/uL (ref 4.0–10.5)

## 2012-07-15 LAB — URINALYSIS, ROUTINE W REFLEX MICROSCOPIC
Ketones, ur: NEGATIVE mg/dL
Leukocytes, UA: NEGATIVE
Nitrite: NEGATIVE
Urobilinogen, UA: 1 mg/dL (ref 0.0–1.0)
pH: 5 (ref 5.0–8.0)

## 2012-07-15 LAB — URINE MICROSCOPIC-ADD ON

## 2012-07-15 MED ORDER — IOHEXOL 300 MG/ML  SOLN
80.0000 mL | Freq: Once | INTRAMUSCULAR | Status: AC | PRN
  Administered 2012-07-15: 80 mL via INTRAVENOUS

## 2012-07-15 NOTE — ED Provider Notes (Signed)
History     CSN: [id number]  Arrival date & time 07/15/12  1624   First MD Initiated Contact with Patient 07/15/12 1723      Chief Complaint  Patient presents with  . Groin Swelling    (Consider location/radiation/quality/duration/timing/severity/associated sxs/prior treatment) HPI  78 year old male with no history of cancer presents complaining of a mass to his lower abdomen.  Patient reports for the past month he notice a gradual onset of a firm mass is low abdomen. Onset was gradual, persistent, moderate in size, nontender, not reducible. He denies any urinary symptoms, no nausea, vomiting, diarrhea, abdominal pain, back pain. Has normal bowel movement, no hematochezia, melena. Patient denies fever but does endorse night sweats for the past 6 months. Denies any abnormal weight changes.  Was a former smoker.  Patient initially follow up with his primary care Dr. with a complaint of diffuse joint pain, most significant to both his shoulders, elbows, and wrists. Joint pain has been ongoing for months. He was sent to the ED for further evaluations of his suprapubic mass.  Past Medical History  Diagnosis Date  . Hypertension   . Arthritis   . BPH (benign prostatic hypertrophy)   . Cataract   . Dyslipidemia   . No blood products   . Refusal of blood transfusions as patient is Jehovah's Witness   . Diabetes mellitus     Past Surgical History  Procedure Date  . Carpal tunnel release   . Colonoscopy 2008  . Total knee arthroplasty 2009    Left  . Sigmoidoscopy   . Eye surgery     bilat cataract   . Pars plana vitrectomy w/ repair of macular hole 01/2012  . Cholecystectomy   . Pars plana vitrectomy w/ scleral buckle 01/28/2012    Procedure: PARS PLANA VITRECTOMY WITH LASER FOR MACULAR HOLE;  Surgeon: Sherrie George, MD;  Location: [facility] OR;  Service: Ophthalmology;  Laterality: Right;  25 gauge ppv right eye   . Gas/fluid exchange 01/28/2012    Procedure: GAS/FLUID EXCHANGE;   Surgeon: Sherrie George, MD;  Location: [facility] OR;  Service: Ophthalmology;  Laterality: Right;    No family history on file.  History  Substance Use Topics  . Smoking status: Former Games developer  . Smokeless tobacco: Never Used  . Alcohol Use: Yes     occasional      Review of Systems  All other systems reviewed and are negative.    Allergies  Review of patient's allergies indicates no known allergies.  Home Medications   Current Outpatient Rx  Name Route Sig Dispense Refill  . BRIMONIDINE TARTRATE 0.2 % OP SOLN Right Eye Place 1 drop into the right eye 2 (two) times daily. 5 mL   . COLCHICINE 0.6 MG PO TABS Oral Take 1 tablet (0.6 mg total) by mouth 2 (two) times daily. 30 tablet 0  . GATIFLOXACIN 0.5 % OP SOLN Right Eye Place 1 drop into the right eye 4 (four) times daily.    Marland Kitchen HYDROCHLOROTHIAZIDE 12.5 MG PO CAPS Oral Take 1 capsule (12.5 mg total) by mouth daily. 30 capsule 11  . INSULIN GLARGINE 100 UNIT/ML [place] SOLN Subcutaneous Inject 30 Units into the skin at bedtime. 3 mL 5  . LISINOPRIL 40 MG PO TABS Oral Take 1 tablet (40 mg total) by mouth daily. 30 tablet 11  . LOVASTATIN 40 MG PO TABS Oral Take 1 tablet (40 mg total) by mouth at bedtime. 30 tablet 11  . METOPROLOL TARTRATE  25 MG PO TABS Oral Take 1 tablet (25 mg total) by mouth daily. 30 tablet 11  . PIOGLITAZONE HCL-METFORMIN HCL 15-850 MG PO TABS Oral Take 1 tablet by mouth 2 (two) times daily with a meal. 60 tablet 5  . PREDNISOLONE ACETATE 16.7 (15 BASE) MG/5ML PO SUSP Oral Take by mouth.      BP 197/81  Pulse 98  Temp 98.5 F (36.9 C) (Oral)  Resp 20  SpO2 98%  Physical Exam  Nursing note and vitals reviewed. Constitutional: He appears well-developed and well-nourished. No distress.  HENT:  Head: Atraumatic.  Eyes: Conjunctivae normal are normal.  Neck: Normal range of motion. Neck supple.  Cardiovascular: Normal rate and regular rhythm.  Exam reveals no gallop and no friction rub.   No murmur  heard. Pulmonary/Chest: Breath sounds normal. No respiratory distress. He has no wheezes. He has no rales. He exhibits no tenderness.  Abdominal: Soft. He exhibits mass (a large 6x8 inches suprapubic mass, not reducible on exam, nontender, no rash.).       Midline abdominal surgical scar, well healing, no hernia.  Genitourinary:       Moderate scrotal edema noted, testicles nontender.  Difficult to assess for hernia.    Penis uncircumcised  Musculoskeletal: He exhibits no edema and no tenderness.    ED Course  Procedures (including critical care time)   Labs Reviewed  URINALYSIS, ROUTINE W REFLEX MICROSCOPIC   Results for orders placed during the hospital encounter of 07/15/12  URINALYSIS, ROUTINE W REFLEX MICROSCOPIC      Component Value Range   Color, Urine YELLOW  YELLOW   APPearance CLOUDY (*) CLEAR   Specific Gravity, Urine 1.022  1.005 - 1.030   pH 5.0  5.0 - 8.0   Glucose, UA NEGATIVE  NEGATIVE mg/dL   Hgb urine dipstick MODERATE (*) NEGATIVE   Bilirubin Urine SMALL (*) NEGATIVE   Ketones, ur NEGATIVE  NEGATIVE mg/dL   Protein, ur 30 (*) NEGATIVE mg/dL   Urobilinogen, UA 1.0  0.0 - 1.0 mg/dL   Nitrite NEGATIVE  NEGATIVE   Leukocytes, UA NEGATIVE  NEGATIVE  URINE MICROSCOPIC-ADD ON      Component Value Range   Squamous Epithelial / LPF FEW (*) RARE   WBC, UA 0-2  <3 WBC/hpf   RBC / HPF 3-6  <3 RBC/hpf   Bacteria, UA RARE  RARE  OCCULT BLOOD, POC DEVICE      Component Value Range   Fecal Occult Bld NEGATIVE    CBC WITH DIFFERENTIAL      Component Value Range   WBC 6.0  4.0 - 10.5 K/uL   RBC 3.92 (*) 4.22 - 5.81 MIL/uL   Hemoglobin 10.3 (*) 13.0 - 17.0 g/dL   HCT 28.4 (*) 13.2 - 44.0 %   MCV 82.9  78.0 - 100.0 fL   MCH 26.3  26.0 - 34.0 pg   MCHC 31.7  30.0 - 36.0 g/dL   RDW 10.2  72.5 - 36.6 %   Platelets 266  150 - 400 K/uL   Neutrophils Relative 64  43 - 77 %   Neutro Abs 3.8  1.7 - 7.7 K/uL   Lymphocytes Relative 25  12 - 46 %   Lymphs Abs 1.5  0.7 -  4.0 K/uL   Monocytes Relative 10  3 - 12 %   Monocytes Absolute 0.6  0.1 - 1.0 K/uL   Eosinophils Relative 1  0 - 5 %   Eosinophils Absolute 0.1  0.0 -  0.7 K/uL   Basophils Relative 0  0 - 1 %   Basophils Absolute 0.0  0.0 - 0.1 K/uL  COMPREHENSIVE METABOLIC PANEL      Component Value Range   Sodium 139  135 - 145 mEq/L   Potassium 4.2  3.5 - 5.1 mEq/L   Chloride 102  96 - 112 mEq/L   CO2 25  19 - 32 mEq/L   Glucose, Bld 157 (*) 70 - 99 mg/dL   BUN 17  6 - 23 mg/dL   Creatinine, Ser 9.14  0.50 - 1.35 mg/dL   Calcium 9.6  8.4 - 78.2 mg/dL   Total Protein 7.4  6.0 - 8.3 g/dL   Albumin 3.2 (*) 3.5 - 5.2 g/dL   AST 15  0 - 37 U/L   ALT 8  0 - 53 U/L   Alkaline Phosphatase 80  39 - 117 U/L   Total Bilirubin 0.4  0.3 - 1.2 mg/dL   GFR calc non Af Amer 83 (*) >90 mL/min   GFR calc Af Amer >90  >90 mL/min   No results found.  1. Large Hematoma to lower abdomen with active extravasation 2. Hypertension 3. hematuria    MDM  Pt has a large suprapubic firm  mass, unsure if it's bladder retention vs. Neoplasm vs hernia.  Not reducible.    Discussed care with my attending.  We both attempt bedside US to evaluate but unable to give good image.  Will obtain abd/pelvic CT scan for further evaluation.  Pt otherwise stable, in NAD, VSS.  8:24 PM Will sent pt to CDU while waiting for CT scan.  Report given to CDU PA for further care.  11:42 PM Radiologist Dr. Kearney Hard called to notified that pt has a large hematoma to his lower abdomen that is actively extravasating from L femoral to R femoral.  No prior documentation of fempop bypass.    My attending has reevaluated pt. His lower extremity exam are as follow: Symmetric bilat fem/DP/PT pulses with Doppler with normal LT, CR<2secs, good movement legs/feet and normal gait in CDU.   D/w Vasc Surg will see Pt in ED to determine dispo.  12:57 AM Vascular surgeon Dr. Arbie Cookey will admit pt for further management of hematoma in his pelvis.  BP  181/92  Pulse 87  Temp 98.5 F (36.9 C) (Oral)  Resp 14  SpO2 98%  I have reviewed nursing notes and vital signs. I personally reviewed the imaging tests through PACS system  I reviewed available ER/hospitalization records thought the EMR    Fayrene Helper, PA-C 07/16/12 0107

## 2012-07-15 NOTE — Progress Notes (Signed)
  Subjective:    Patient ID: Joe Barry, male    DOB: 04-21-1934, 78 y.o.   MRN: [id number]  HPI He has a two-week history of joint aches and pains as well as lower abdominal pain and swelling. He's had no urinary symptoms, trouble with BMs . No fever, chills, sore throat or coughing..   Review of Systems     Objective:   Physical Exam alert and in no distress. Tympanic membranes and canals are normal. Throat is clear. Tonsils are normal. Neck is supple without adenopathy or thyromegaly. Cardiac exam shows a regular sinus rhythm without murmurs or gallops. Lungs are clear to auscultation. Abdominal exam shows a firm 6 x 8" mass in the suprapubic area.        Assessment & Plan:   1. Bilateral lower quadrant abdominal mass    His daughter will take him to the emergency room for further evaluation.

## 2012-07-15 NOTE — ED Notes (Signed)
Pt. Was seen by PMD today due to swelling in his groin area. He states it started around the end of September. Denies N/V/D, chills or fever. Denies having problems having BM or having blood in his stool. States he has been starting to have pain since last week. PMD sent him here for further work-up.

## 2012-07-15 NOTE — ED Provider Notes (Signed)
[name] is a 78 y.o. male in CDU pending CT abdomen pelvis with contrast from pod A. Sign out from PA [place] as follows: Patient is a poor historian accompanied by daughter, sent by primary care Dr. for evaluation of suprapubic mass. Mass is nontender, patient denies any fever, change in bowel or bladder habits. States that the mass has been steady in size for approximately one month.  Patient seen and evaluated the bedside, Dr. present. In no acute distress. Patient denies any pain at this time. Heart is regular rate and rhythm with no murmurs rubs or gallops, patient's lung sounds are clear to auscultation bilaterally, abdominal exam reveals a large firm, nontender, non-warm mass in the suprapubic area it is greater on the left side but extends past the midline. There are no peritoneal signs, or tenderness.  Labs show hematuria, mild anemia (guaiac-negative), CT pending.  Case Signed out to Dr. Fonnie Jarvis at shift change  Wynetta Emery, PA-C 07/16/12 [phone]

## 2012-07-15 NOTE — ED Notes (Signed)
Pt was seen by PCP today and sent to ED. Pt has edema above genital area that is firm to palpation. Pt states he noticed this in September 2013 but daughter states it may have been there longer. Pt denies pain at this time.

## 2012-07-15 NOTE — ED Provider Notes (Signed)
Medical screening examination/treatment/procedure(s) were conducted as a shared visit with non-physician practitioner(s) and myself.  I personally evaluated the patient during the encounter  Painless firm suprapubic irregular NT mass enlarging over several weeks without acute GI/GU change in function per Pt.  Symmetric bilat fem/DP/PT pulses with Doppler with normal LT, CR<2secs, good movement legs/feet and normal gait in CDU.  D/w Vasc Surg will see Pt in ED to determine dispo. 1610  Hurman Horn, MD 07/17/12 Rickey Primus

## 2012-07-16 ENCOUNTER — Encounter (HOSPITAL_COMMUNITY): Payer: Self-pay | Admitting: Acute Care

## 2012-07-16 ENCOUNTER — Encounter (HOSPITAL_COMMUNITY): Payer: Self-pay | Admitting: Anesthesiology

## 2012-07-16 ENCOUNTER — Inpatient Hospital Stay (HOSPITAL_COMMUNITY): Payer: Medicare Other | Admitting: Anesthesiology

## 2012-07-16 ENCOUNTER — Encounter (HOSPITAL_COMMUNITY)
Admission: EM | Disposition: A | Discharge status: Home / Self Care | Payer: Self-pay | Source: Home / Self Care | Attending: Vascular Surgery

## 2012-07-16 ENCOUNTER — Inpatient Hospital Stay (HOSPITAL_COMMUNITY): Payer: Medicare Other

## 2012-07-16 DIAGNOSIS — M7989 Other specified soft tissue disorders: Secondary | ICD-10-CM

## 2012-07-16 DIAGNOSIS — I724 Aneurysm of artery of lower extremity: Secondary | ICD-10-CM

## 2012-07-16 HISTORY — PX: FALSE ANEURYSM REPAIR: SHX5152

## 2012-07-16 LAB — GLUCOSE, CAPILLARY
Glucose-Capillary: 163 mg/dL — ABNORMAL HIGH (ref 70–99)
Glucose-Capillary: 148 mg/dL — ABNORMAL HIGH (ref 70–99)

## 2012-07-16 LAB — MRSA PCR SCREENING: MRSA by PCR: NEGATIVE

## 2012-07-16 LAB — POCT I-STAT 4, (NA,K, GLUC, HGB,HCT)
Glucose, Bld: 147 mg/dL — ABNORMAL HIGH (ref 70–99)
HCT: 26 % — ABNORMAL LOW (ref 39.0–52.0)
Hemoglobin: 8.8 g/dL — ABNORMAL LOW (ref 13.0–17.0)

## 2012-07-16 LAB — PROTIME-INR: Prothrombin Time: 13.8 seconds (ref 11.6–15.2)

## 2012-07-16 SURGERY — REPAIR, PSEUDOANEURYSM
Anesthesia: General | Site: Groin | Laterality: Bilateral | Wound class: Clean

## 2012-07-16 MED ORDER — METFORMIN HCL 850 MG PO TABS
850.0000 mg | ORAL_TABLET | Freq: Two times a day (BID) | ORAL | Status: DC
  Administered 2012-07-18 – 2012-07-19 (×3): 850 mg via ORAL
  Filled 2012-07-16 (×5): qty 1

## 2012-07-16 MED ORDER — PHENYLEPHRINE HCL 10 MG/ML IJ SOLN
INTRAMUSCULAR | Status: DC | PRN
  Administered 2012-07-16 (×5): 80 ug via INTRAVENOUS
  Administered 2012-07-16: 40 ug via INTRAVENOUS

## 2012-07-16 MED ORDER — HEPARIN SODIUM (PORCINE) 1000 UNIT/ML IJ SOLN
INTRAMUSCULAR | Status: DC | PRN
  Administered 2012-07-16: 3000 [IU] via INTRAVENOUS
  Administered 2012-07-16: 7000 [IU] via INTRAVENOUS

## 2012-07-16 MED ORDER — LISINOPRIL 40 MG PO TABS
40.0000 mg | ORAL_TABLET | Freq: Every day | ORAL | Status: DC
  Administered 2012-07-18 – 2012-07-19 (×2): 40 mg via ORAL
  Filled 2012-07-16 (×4): qty 1

## 2012-07-16 MED ORDER — LIDOCAINE HCL (CARDIAC) 20 MG/ML IV SOLN
INTRAVENOUS | Status: DC | PRN
  Administered 2012-07-16: 100 mg via INTRAVENOUS

## 2012-07-16 MED ORDER — ONDANSETRON HCL 4 MG/2ML IJ SOLN: 4.0000 mg | Freq: Four times a day (QID) | INTRAMUSCULAR | Status: DC | PRN

## 2012-07-16 MED ORDER — CEFAZOLIN SODIUM-DEXTROSE 2-3 GM-% IV SOLR
INTRAVENOUS | Status: AC
  Filled 2012-07-16: qty 50

## 2012-07-16 MED ORDER — INSULIN GLARGINE 100 UNIT/ML ~~LOC~~ SOLN
30.0000 [IU] | Freq: Every day | SUBCUTANEOUS | Status: DC
  Administered 2012-07-16 – 2012-07-18 (×3): 30 [IU] via SUBCUTANEOUS

## 2012-07-16 MED ORDER — KCL IN DEXTROSE-NACL 20-5-0.45 MEQ/L-%-% IV SOLN
INTRAVENOUS | Status: DC
  Administered 2012-07-16: 05:00:00 via INTRAVENOUS
  Filled 2012-07-16 (×3): qty 1000

## 2012-07-16 MED ORDER — OXYCODONE HCL 5 MG/5ML PO SOLN: 5.0000 mg | Freq: Once | ORAL | Status: AC | PRN

## 2012-07-16 MED ORDER — POTASSIUM CHLORIDE CRYS ER 20 MEQ PO TBCR: 20.0000 meq | EXTENDED_RELEASE_TABLET | Freq: Once | ORAL | Status: AC | PRN

## 2012-07-16 MED ORDER — SODIUM CHLORIDE 0.9 % IR SOLN
Status: DC | PRN
  Administered 2012-07-16: 11:00:00

## 2012-07-16 MED ORDER — ALUM & MAG HYDROXIDE-SIMETH 200-200-20 MG/5ML PO SUSP: 15.0000 mL | ORAL | Status: DC | PRN

## 2012-07-16 MED ORDER — CEFAZOLIN SODIUM 1-5 GM-% IV SOLN
INTRAVENOUS | Status: AC
  Filled 2012-07-16: qty 50

## 2012-07-16 MED ORDER — ROCURONIUM BROMIDE 100 MG/10ML IV SOLN
INTRAVENOUS | Status: DC | PRN
  Administered 2012-07-16: 50 mg via INTRAVENOUS

## 2012-07-16 MED ORDER — METOPROLOL TARTRATE 25 MG PO TABS
25.0000 mg | ORAL_TABLET | Freq: Every day | ORAL | Status: DC
  Administered 2012-07-18 – 2012-07-19 (×2): 25 mg via ORAL
  Filled 2012-07-16 (×4): qty 1

## 2012-07-16 MED ORDER — PROPOFOL 10 MG/ML IV BOLUS
INTRAVENOUS | Status: DC | PRN
  Administered 2012-07-16: 100 mg via INTRAVENOUS

## 2012-07-16 MED ORDER — SIMVASTATIN 5 MG PO TABS
5.0000 mg | ORAL_TABLET | Freq: Every day | ORAL | Status: DC
  Administered 2012-07-16 – 2012-07-18 (×3): 5 mg via ORAL
  Filled 2012-07-16 (×4): qty 1

## 2012-07-16 MED ORDER — 0.9 % SODIUM CHLORIDE (POUR BTL) OPTIME
TOPICAL | Status: DC | PRN
  Administered 2012-07-16: 1000 mL

## 2012-07-16 MED ORDER — LABETALOL HCL 5 MG/ML IV SOLN
10.0000 mg | INTRAVENOUS | Status: DC | PRN
  Filled 2012-07-16: qty 4

## 2012-07-16 MED ORDER — INFLUENZA VIRUS VACC SPLIT PF IM SUSP
0.5000 mL | INTRAMUSCULAR | Status: AC
  Administered 2012-07-17: 0.5 mL via INTRAMUSCULAR
  Filled 2012-07-16: qty 0.5

## 2012-07-16 MED ORDER — PANTOPRAZOLE SODIUM 40 MG PO TBEC
40.0000 mg | DELAYED_RELEASE_TABLET | Freq: Every day | ORAL | Status: DC
  Administered 2012-07-17 – 2012-07-19 (×3): 40 mg via ORAL
  Filled 2012-07-16 (×3): qty 1

## 2012-07-16 MED ORDER — PIOGLITAZONE HCL-METFORMIN HCL 15-850 MG PO TABS: 1.0000 | ORAL_TABLET | Freq: Two times a day (BID) | ORAL | Status: DC

## 2012-07-16 MED ORDER — ZOLPIDEM TARTRATE 5 MG PO TABS: 5.0000 mg | ORAL_TABLET | Freq: Every evening | ORAL | Status: DC | PRN

## 2012-07-16 MED ORDER — ACETAMINOPHEN 325 MG PO TABS
325.0000 mg | ORAL_TABLET | ORAL | Status: DC | PRN
  Administered 2012-07-17: 650 mg via ORAL
  Filled 2012-07-16: qty 2

## 2012-07-16 MED ORDER — MIDAZOLAM HCL 5 MG/5ML IJ SOLN
INTRAMUSCULAR | Status: DC | PRN
  Administered 2012-07-16: 1 mg via INTRAVENOUS

## 2012-07-16 MED ORDER — ONDANSETRON HCL 4 MG/2ML IJ SOLN: 4.0000 mg | Freq: Once | INTRAMUSCULAR | Status: AC | PRN

## 2012-07-16 MED ORDER — MEPERIDINE HCL 25 MG/ML IJ SOLN: 6.2500 mg | INTRAMUSCULAR | Status: DC | PRN

## 2012-07-16 MED ORDER — POTASSIUM CHLORIDE CRYS ER 20 MEQ PO TBCR: 20.0000 meq | EXTENDED_RELEASE_TABLET | Freq: Once | ORAL | Status: DC

## 2012-07-16 MED ORDER — COLCHICINE 0.6 MG PO TABS
0.6000 mg | ORAL_TABLET | Freq: Every day | ORAL | Status: DC | PRN
  Filled 2012-07-16: qty 1

## 2012-07-16 MED ORDER — ACETAMINOPHEN 650 MG RE SUPP: 325.0000 mg | RECTAL | Status: DC | PRN

## 2012-07-16 MED ORDER — HYDROCHLOROTHIAZIDE 12.5 MG PO CAPS
12.5000 mg | ORAL_CAPSULE | Freq: Every day | ORAL | Status: DC
  Administered 2012-07-18 – 2012-07-19 (×2): 12.5 mg via ORAL
  Filled 2012-07-16 (×4): qty 1

## 2012-07-16 MED ORDER — GLYCOPYRROLATE 0.2 MG/ML IJ SOLN
INTRAMUSCULAR | Status: DC | PRN
  Administered 2012-07-16: 0.4 mg via INTRAVENOUS

## 2012-07-16 MED ORDER — ONDANSETRON HCL 4 MG/2ML IJ SOLN
INTRAMUSCULAR | Status: DC | PRN
  Administered 2012-07-16: 4 mg via INTRAVENOUS

## 2012-07-16 MED ORDER — FENTANYL CITRATE 0.05 MG/ML IJ SOLN
INTRAMUSCULAR | Status: DC | PRN
  Administered 2012-07-16: 50 ug via INTRAVENOUS
  Administered 2012-07-16: 150 ug via INTRAVENOUS
  Administered 2012-07-16 (×3): 50 ug via INTRAVENOUS
  Administered 2012-07-16: 25 ug via INTRAVENOUS

## 2012-07-16 MED ORDER — PIOGLITAZONE HCL 15 MG PO TABS
15.0000 mg | ORAL_TABLET | Freq: Two times a day (BID) | ORAL | Status: DC
  Administered 2012-07-18 – 2012-07-19 (×3): 15 mg via ORAL
  Filled 2012-07-16 (×5): qty 1

## 2012-07-16 MED ORDER — SODIUM CHLORIDE 0.9 % IV SOLN: 500.0000 mL | Freq: Once | INTRAVENOUS | Status: AC | PRN

## 2012-07-16 MED ORDER — METOPROLOL TARTRATE 1 MG/ML IV SOLN: 2.0000 mg | INTRAVENOUS | Status: DC | PRN

## 2012-07-16 MED ORDER — GUAIFENESIN-DM 100-10 MG/5ML PO SYRP: 15.0000 mL | ORAL_SOLUTION | ORAL | Status: DC | PRN

## 2012-07-16 MED ORDER — HYDRALAZINE HCL 20 MG/ML IJ SOLN
10.0000 mg | INTRAMUSCULAR | Status: DC | PRN
  Filled 2012-07-16: qty 0.5

## 2012-07-16 MED ORDER — HYDROMORPHONE HCL PF 1 MG/ML IJ SOLN
0.2500 mg | INTRAMUSCULAR | Status: DC | PRN
  Filled 2012-07-16: qty 1

## 2012-07-16 MED ORDER — PHENOL 1.4 % MT LIQD: 1.0000 | OROMUCOSAL | Status: DC | PRN

## 2012-07-16 MED ORDER — OXYCODONE HCL 5 MG PO TABS: 5.0000 mg | ORAL_TABLET | Freq: Once | ORAL | Status: AC | PRN

## 2012-07-16 MED ORDER — DEXTROSE 5 % IV SOLN
1.5000 g | Freq: Two times a day (BID) | INTRAVENOUS | Status: AC
  Administered 2012-07-16 – 2012-07-17 (×2): 1.5 g via INTRAVENOUS
  Filled 2012-07-16 (×2): qty 1.5

## 2012-07-16 MED ORDER — OXYCODONE HCL 5 MG PO TABS
5.0000 mg | ORAL_TABLET | ORAL | Status: DC | PRN
  Administered 2012-07-17: 5 mg via ORAL
  Administered 2012-07-18 (×2): 10 mg via ORAL
  Filled 2012-07-16: qty 2
  Filled 2012-07-16: qty 1
  Filled 2012-07-16: qty 2

## 2012-07-16 MED ORDER — VECURONIUM BROMIDE 10 MG IV SOLR
INTRAVENOUS | Status: DC | PRN
  Administered 2012-07-16: 1 mg via INTRAVENOUS

## 2012-07-16 MED ORDER — DOCUSATE SODIUM 100 MG PO CAPS
100.0000 mg | ORAL_CAPSULE | Freq: Every day | ORAL | Status: DC
  Administered 2012-07-18 – 2012-07-19 (×2): 100 mg via ORAL
  Filled 2012-07-16 (×3): qty 1

## 2012-07-16 MED ORDER — BRIMONIDINE TARTRATE 0.2 % OP SOLN
1.0000 [drp] | Freq: Two times a day (BID) | OPHTHALMIC | Status: DC
  Administered 2012-07-16 – 2012-07-19 (×6): 1 [drp] via OPHTHALMIC
  Filled 2012-07-16: qty 5

## 2012-07-16 MED ORDER — LACTATED RINGERS IV SOLN
INTRAVENOUS | Status: DC | PRN
  Administered 2012-07-16 (×3): via INTRAVENOUS

## 2012-07-16 MED ORDER — SODIUM CHLORIDE 0.9 % IV SOLN
INTRAVENOUS | Status: DC | PRN
  Administered 2012-07-16: 12:00:00 via INTRAVENOUS

## 2012-07-16 MED ORDER — NEOSTIGMINE METHYLSULFATE 1 MG/ML IJ SOLN
INTRAMUSCULAR | Status: DC | PRN
  Administered 2012-07-16: 3 mg via INTRAVENOUS

## 2012-07-16 MED ORDER — COLCHICINE 0.6 MG PO TABS
0.6000 mg | ORAL_TABLET | Freq: Two times a day (BID) | ORAL | Status: DC
  Filled 2012-07-16 (×2): qty 1

## 2012-07-16 MED ORDER — SODIUM CHLORIDE 0.9 % IV SOLN
INTRAVENOUS | Status: DC
  Administered 2012-07-16 – 2012-07-18 (×2): via INTRAVENOUS

## 2012-07-16 MED ORDER — MORPHINE SULFATE 2 MG/ML IJ SOLN
2.0000 mg | INTRAMUSCULAR | Status: DC | PRN
  Filled 2012-07-16 (×2): qty 1

## 2012-07-16 MED ORDER — PROTAMINE SULFATE 10 MG/ML IV SOLN
INTRAVENOUS | Status: DC | PRN
  Administered 2012-07-16: 20 mg via INTRAVENOUS
  Administered 2012-07-16 (×2): 15 mg via INTRAVENOUS

## 2012-07-16 MED ORDER — DOPAMINE-DEXTROSE 3.2-5 MG/ML-% IV SOLN: 3.0000 ug/kg/min | INTRAVENOUS | Status: DC

## 2012-07-16 MED ORDER — CEFAZOLIN SODIUM-DEXTROSE 2-3 GM-% IV SOLR
INTRAVENOUS | Status: DC | PRN
  Administered 2012-07-16 (×2): 2 g via INTRAVENOUS

## 2012-07-16 SURGICAL SUPPLY — 58 items
BANDAGE ESMARK 6X9 LF (GAUZE/BANDAGES/DRESSINGS) IMPLANT
BENZOIN TINCTURE PRP APPL 2/3 (GAUZE/BANDAGES/DRESSINGS) ×2 IMPLANT
BNDG ESMARK 6X9 LF (GAUZE/BANDAGES/DRESSINGS)
CANISTER SUCTION 2500CC (MISCELLANEOUS) ×2 IMPLANT
CATH EMB 4FR 80CM (CATHETERS) ×2 IMPLANT
CLIP LIGATING EXTRA MED SLVR (CLIP) ×2 IMPLANT
CLIP LIGATING EXTRA SM BLUE (MISCELLANEOUS) ×2 IMPLANT
CLOTH BEACON ORANGE TIMEOUT ST (SAFETY) ×2 IMPLANT
COVER SURGICAL LIGHT HANDLE (MISCELLANEOUS) ×2 IMPLANT
CUFF TOURNIQUET SINGLE 18IN (TOURNIQUET CUFF) IMPLANT
CUFF TOURNIQUET SINGLE 24IN (TOURNIQUET CUFF) IMPLANT
CUFF TOURNIQUET SINGLE 34IN LL (TOURNIQUET CUFF) IMPLANT
CUFF TOURNIQUET SINGLE 44IN (TOURNIQUET CUFF) IMPLANT
DRAIN SNY 10X20 3/4 PERF (WOUND CARE) IMPLANT
DRAPE WARM FLUID 44X44 (DRAPE) ×2 IMPLANT
DRAPE X-RAY CASS 24X20 (DRAPES) IMPLANT
DRSG COVADERM 4X6 (GAUZE/BANDAGES/DRESSINGS) ×2 IMPLANT
DRSG COVADERM 4X8 (GAUZE/BANDAGES/DRESSINGS) IMPLANT
ELECT REM PT RETURN 9FT ADLT (ELECTROSURGICAL) ×2
ELECTRODE REM PT RTRN 9FT ADLT (ELECTROSURGICAL) ×1 IMPLANT
EVACUATOR SILICONE 100CC (DRAIN) ×4 IMPLANT
GLOVE BIO SURGEON STRL SZ 6.5 (GLOVE) ×8 IMPLANT
GLOVE BIOGEL PI IND STRL 6.5 (GLOVE) ×3 IMPLANT
GLOVE BIOGEL PI INDICATOR 6.5 (GLOVE) ×3
GLOVE SS BIOGEL STRL SZ 7.5 (GLOVE) ×2 IMPLANT
GLOVE SUPERSENSE BIOGEL SZ 7.5 (GLOVE) ×2
GOWN STRL NON-REIN LRG LVL3 (GOWN DISPOSABLE) ×8 IMPLANT
HEMASHIELD FINESSE CARDIO (Vascular Products) ×2 IMPLANT
KIT BASIN OR (CUSTOM PROCEDURE TRAY) ×2 IMPLANT
KIT ROOM TURNOVER OR (KITS) ×2 IMPLANT
MARKER SKIN DUAL TIP RULER LAB (MISCELLANEOUS) ×2 IMPLANT
NS IRRIG 1000ML POUR BTL (IV SOLUTION) ×4 IMPLANT
PACK PERIPHERAL VASCULAR (CUSTOM PROCEDURE TRAY) ×2 IMPLANT
PAD ARMBOARD 7.5X6 YLW CONV (MISCELLANEOUS) ×4 IMPLANT
PADDING CAST COTTON 6X4 STRL (CAST SUPPLIES) IMPLANT
PATCH HEMASHIELD FINESS CARDIO (Vascular Products) ×1 IMPLANT
SET COLLECT BLD 21X3/4 12 (NEEDLE) IMPLANT
SPONGE GAUZE 4X4 12PLY (GAUZE/BANDAGES/DRESSINGS) ×2 IMPLANT
SPONGE LAP 18X18 X RAY DECT (DISPOSABLE) ×4 IMPLANT
STAPLER VISISTAT 35W (STAPLE) IMPLANT
STOPCOCK 4 WAY LG BORE MALE ST (IV SETS) ×2 IMPLANT
STRIP CLOSURE SKIN 1/2X4 (GAUZE/BANDAGES/DRESSINGS) ×2 IMPLANT
SUT ETHILON 3 0 PS 1 (SUTURE) ×2 IMPLANT
SUT PROLENE 4 0 SH DA (SUTURE) ×4 IMPLANT
SUT PROLENE 5 0 C 1 24 (SUTURE) ×6 IMPLANT
SUT PROLENE 5 0 C 1 36 (SUTURE) ×2 IMPLANT
SUT PROLENE 6 0 CC (SUTURE) ×4 IMPLANT
SUT VIC AB 2-0 CTX 36 (SUTURE) ×4 IMPLANT
SUT VIC AB 3-0 SH 27 (SUTURE) ×2
SUT VIC AB 3-0 SH 27X BRD (SUTURE) ×2 IMPLANT
SYR 3ML LL SCALE MARK (SYRINGE) ×2 IMPLANT
TOWEL OR 17X24 6PK STRL BLUE (TOWEL DISPOSABLE) ×4 IMPLANT
TOWEL OR 17X26 10 PK STRL BLUE (TOWEL DISPOSABLE) ×2 IMPLANT
TRAY FOLEY CATH 14FRSI W/METER (CATHETERS) ×2 IMPLANT
TUBING EXTENTION W/L.L. (IV SETS) IMPLANT
UNDERPAD 30X30 INCONTINENT (UNDERPADS AND DIAPERS) ×2 IMPLANT
WATER STERILE IRR 1000ML POUR (IV SOLUTION) ×2 IMPLANT
YANKAUER SUCT BULB TIP NO VENT (SUCTIONS) ×2 IMPLANT

## 2012-07-16 NOTE — Interval H&P Note (Signed)
History and Physical Interval Note:  07/16/2012 7:23 AM  Joe Barry  has presented today for surgery, with the diagnosis of false femoral aneurysm  The various methods of treatment have been discussed with the patient and family. After consideration of risks, benefits and other options for treatment, the patient has consented to  Procedure(s) (LRB) with comments: REPAIR FALSE ANEURYSM (N/A) - false femoral aneurysm as a surgical intervention .  The patient's history has been reviewed, patient examined, no change in status, stable for surgery.  I have reviewed the patient's chart and labs.  Questions were answered to the patient's satisfaction.     Akiko Schexnider

## 2012-07-16 NOTE — Anesthesia Procedure Notes (Signed)
Procedure Name: Intubation Date/Time: 07/16/2012 9:57 AM Performed by: Arlice Colt B Pre-anesthesia Checklist: Patient identified, Emergency Drugs available, Suction available, Patient being monitored and Timeout performed Patient Re-evaluated:Patient Re-evaluated prior to inductionOxygen Delivery Method: Circle system utilized Preoxygenation: Pre-oxygenation with 100% oxygen Intubation Type: IV induction Ventilation: Mask ventilation without difficulty Laryngoscope Size: Mac and 3 Grade View: Grade III Tube type: Oral Tube size: 7.5 mm Number of attempts: 2 Airway Equipment and Method: Stylet Placement Confirmation: ETT inserted through vocal cords under direct vision,  positive ETCO2 and breath sounds checked- equal and bilateral Secured at: 22 cm Tube secured with: Tape Dental Injury: Teeth and Oropharynx as per pre-operative assessment

## 2012-07-16 NOTE — Anesthesia Preprocedure Evaluation (Addendum)
Anesthesia Evaluation  Patient identified by MRN, date of birth, ID band Patient awake    Reviewed: Allergy & Precautions, H&P , NPO status , Patient's Chart, lab work & pertinent test results  Airway Mallampati: I TM Distance: >3 FB Neck ROM: Limited    Dental  (+) Poor Dentition, Chipped, Missing and Dental Advisory Given   Pulmonary          Cardiovascular hypertension, Pt. on home beta blockers Rhythm:Regular Rate:Normal     Neuro/Psych    GI/Hepatic   Endo/Other  diabetes, Well Controlled, Type 2, Insulin Dependent and Oral Hypoglycemic Agents  Renal/GU      Musculoskeletal   Abdominal   Peds  Hematology   Anesthesia Other Findings   Reproductive/Obstetrics                          Anesthesia Physical Anesthesia Plan  ASA: III  Anesthesia Plan: General   Post-op Pain Management:    Induction: Intravenous  Airway Management Planned: Oral ETT  Additional Equipment: Arterial line and Ultrasound Guidance Line Placement  Intra-op Plan:   Post-operative Plan: Extubation in OR  Informed Consent: I have reviewed the patients History and Physical, chart, labs and discussed the procedure including the risks, benefits and alternatives for the proposed anesthesia with the patient or authorized representative who has indicated his/her understanding and acceptance.   Dental advisory given  Plan Discussed with: CRNA, Surgeon and Anesthesiologist  Anesthesia Plan Comments:        Anesthesia Quick Evaluation

## 2012-07-16 NOTE — Anesthesia Postprocedure Evaluation (Signed)
Anesthesia Post Note  Patient: Joe Barry  Procedure(s) Performed: Procedure(s) (LRB): REPAIR FALSE ANEURYSM (Bilateral)  Anesthesia type: general  Patient location: PACU  Post pain: Pain level controlled  Post assessment: Patient's Cardiovascular Status Stable  Last Vitals:  Filed Vitals:   07/16/12 1358  BP:   Pulse: 88  Temp: 37.1 C  Resp: 18    Post vital signs: Reviewed and stable  Level of consciousness: sedated  Complications: No apparent anesthesia complications

## 2012-07-16 NOTE — ED Notes (Signed)
Pt daughter [name] [phone], call with anything

## 2012-07-16 NOTE — Transfer of Care (Signed)
Immediate Anesthesia Transfer of Care Note  Patient: Joe Barry  Procedure(s) Performed: Procedure(s) (LRB) with comments: REPAIR FALSE ANEURYSM (Bilateral) - false femoral aneurysm repair with femoral artery repair  Patient Location: PACU  Anesthesia Type:General  Level of Consciousness: awake, alert  and confused  Airway & Oxygen Therapy: Patient Spontanous Breathing and Patient connected to nasal cannula oxygen  Post-op Assessment: Report given to PACU RN and Post -op Vital signs reviewed and stable  Post vital signs: Reviewed and stable  Complications: No apparent anesthesia complications
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
## 2012-07-16 NOTE — Progress Notes (Signed)
UR COMPLETE.   Calib Wadhwa Wise Nickia Boesen, RN, BSN   Phone #[phone] 

## 2012-07-16 NOTE — Op Note (Signed)
OPERATIVE REPORT  DATE OF SURGERY: 07/16/2012  PATIENT: Joe Barry, 78 y.o. male MRN: [id number]  DOB: [birth year]  PRE-OPERATIVE DIAGNOSIS: Very large bilateral femoral false aneurysm  POST-OPERATIVE DIAGNOSIS:  Same  PROCEDURE: Repair of bilateral femoral false aneurysm evacuation of very large chronic hematoma  SURGEON:  Gretta Began, M.D.  PHYSICIAN ASSISTANT: Rhyne   ANESTHESIA:  Gen.  EBL: 1700 ml  Total I/O In: 3500 [I.V.:3075; Blood:425] Out: 2150 [Urine:450; Blood:1700]  BLOOD ADMINISTERED: 2 units Cell Saver  DRAINS: Blake drain and both groins  SPECIMEN: None  COUNTS CORRECT:  YES  PLAN OF CARE: PACU stable   PATIENT DISPOSITION:  PACU - hemodynamically stable  PROCEDURE DETAILS: Patient was taken to the operating placed supine position where area of both groins were prepped and draped in usual sterile fashion. Patient was felt to probably had a prior fem-fem bypass by history but this is been many years ago and the patient does not recall the details. Does have scars were both groins. He presented emergency room yesterday with huge false aneurysm extending throughout his pelvis and prepubic area. He had a very large false aneurysm. He arising off the common femoral artery the left and also small false aneurysm in the right. He has a nevus witnessed and specifically refused blood to allow Cell Saver use.  The incision near the left and carried down to mobilize the very large false aneurysm. This was chronic. The inguinal ligament was exposed and the external iliac artery was isolated above the level of the false aneurysm at the inguinal ligament. The patient was given the seventh of intravenous heparin after adequate circulation time the external iliac was occluded. The false aneurysm was entered and the digital control was used for backbleeding from the distal common femoral artery. It was obvious that there had been old fem-fem bypass it completely disrupted and was  chronically occluded. He had a very large false aneurysm arising off the graft. The graft was temporarily oversewn with 5-0 Prolene for control. The patient had a huge extensive chronic false aneurysm thrombus. This was removed in removing this there was bleeding from the left groin to the tunnel. As was controlled with digital pressure. A separate incision was made in the right groin scar and carried down to isolate the area bleeding. This was also arising from the common femoral artery at the old anastomosis where the graft and disrupted. There was no evidence of purulence. The common femoral artery was controlled proximally and distally. The old graft was removed in its entirety and it Dacron Finesse patch onto the field and sent as a patch angioplasty with a running 5-0 Prolene suture. Anastomosis was tested and found to be adequate. Next attention was returned the left groin the common femoral artery below the anastomosis was exposed and the artery was reoccluded above and below. The old graft was taken off its entirety and likewise the left groin patch angioplasty with Dacron patch was used. After usual flushing and clamps removed and good flow was noted. Should be noted that the patient did have chronically occluded left right fem-fem bypass it did have excellent pulsatile flow via collaterals in the right groin. The patient was given 50 mg of protamine to reverse the heparin. A 15 Blake drain was brought through a separate stab incision in the left groin was brought through the tunnel over towards the right and groin. Irrigated hemostasis obtained with cautery. Wounds were closed with 2-0 Vicryl in several layers of subcutaneous tissue  and skin was closed with skin staples. Sterile dressing was applied the patient was taken to the recovery in stable condition  Gretta Began, M.D. 07/16/2012 1:54 PM

## 2012-07-17 LAB — BASIC METABOLIC PANEL
CO2: 26 mEq/L (ref 19–32)
Chloride: 99 mEq/L (ref 96–112)
Creatinine, Ser: 0.98 mg/dL (ref 0.50–1.35)
GFR calc Af Amer: >90 mL/min (ref >90)
Potassium: 4.2 mEq/L (ref 3.5–5.1)

## 2012-07-17 LAB — CBC
HCT: 27 % — ABNORMAL LOW (ref 39.0–52.0)
MCV: 80.8 fL (ref 78.0–100.0)
Platelets: 235 10*3/uL (ref 150–400)
RBC: 3.34 MIL/uL — ABNORMAL LOW (ref 4.22–5.81)
RDW: 14.7 % (ref 11.5–15.5)
WBC: 11.2 10*3/uL — ABNORMAL HIGH (ref 4.0–10.5)

## 2012-07-17 LAB — GLUCOSE, CAPILLARY
Glucose-Capillary: 171 mg/dL — ABNORMAL HIGH (ref 70–99)
Glucose-Capillary: 195 mg/dL — ABNORMAL HIGH (ref 70–99)

## 2012-07-17 NOTE — ED Provider Notes (Signed)
Medical screening examination/treatment/procedure(s) were conducted as a shared visit with non-physician practitioner(s) and myself.  I personally evaluated the patient during the encounter  Hurman Horn, MD 07/17/12 1640

## 2012-07-17 NOTE — Progress Notes (Signed)
Physical Therapy Evaluation Patient Details Name: Joe Barry MRN: [id number] DOB: 08/15/1934 Today's Date: 07/17/2012 Time: 4696-2952 PT Time Calculation (min): 22 min  PT Assessment / Plan / Recommendation Clinical Impression  78 yo male admitted with bil femoral pseudoaneurysms, now s/p surgical repair; Presetns with decr functional mobility, Will benefit from PT to maximize independence and safety with mobility, and to facilitate dc planning    PT Assessment  Patient needs continued PT services    Follow Up Recommendations  Post acute inpatient    Does the patient have the potential to tolerate intense rehabilitation   Yes, Recommend IP Rehab Screening  Barriers to Discharge        Equipment Recommendations  None recommended by PT    Recommendations for Other Services OT consult (as ordered)   Frequency Min 3X/week    Precautions / Restrictions Precautions Precautions: Fall Restrictions Weight Bearing Restrictions: No Other Position/Activity Restrictions: pt reports he has a "bad" right knee   Pertinent Vitals/Pain VSS Pain 5/10 post OOB to chair; RN notified      Mobility  Bed Mobility Bed Mobility: Supine to Sit;Sitting - Scoot to Edge of Bed Supine to Sit: 3: Mod assist;HOB elevated;With rails Sitting - Scoot to Edge of Bed: 3: Mod assist Details for Bed Mobility Assistance: Mod assist to support trunk OOB (pt leaning posteriorly).  Cueing for hand placement on rails and sequencing. Transfers Transfers: Sit to Stand;Stand to Sit Sit to Stand: 3: Mod assist;From bed;With upper extremity assist Stand to Sit: 4: Min assist;To chair/3-in-1;With armrests;With upper extremity assist Details for Transfer Assistance: Assist for anterior weight shift in order to facilitate lift off from bed and to provide steadying once in full upright standing position. Cueing for safe hand placement. Ambulation/Gait Ambulation/Gait Assistance: 1: +2 Total assist Ambulation/Gait:  Patient Percentage: 70% Ambulation Distance (Feet): 5 Feet Assistive device: Rolling walker Ambulation/Gait Assistance Details: Cues for safety, and to position self safely in front of chair prior to sitting; Wide stance, very short step length, heavy dependence on UE support Gait Pattern: Decreased stride length;Decreased step length - right;Decreased step length - left;Wide base of support Gait velocity: decr    Shoulder Instructions     Exercises     PT Diagnosis: Difficulty walking;Generalized weakness;Acute pain  PT Problem List: Decreased strength;Decreased range of motion;Decreased activity tolerance;Decreased balance;Decreased mobility;Decreased knowledge of use of DME;Pain PT Treatment Interventions: DME instruction;Gait training;Functional mobility training;Therapeutic activities;Therapeutic exercise;Neuromuscular re-education;Patient/family education   PT Goals Acute Rehab PT Goals PT Goal Formulation: With patient Time For Goal Achievement: 07/31/12 Potential to Achieve Goals: Good Pt will go Supine/Side to Sit: with modified independence PT Goal: Supine/Side to Sit - Progress: Goal set today Pt will go Sit to Supine/Side: with modified independence PT Goal: Sit to Supine/Side - Progress: Goal set today Pt will go Sit to Stand: with modified independence PT Goal: Sit to Stand - Progress: Goal set today Pt will go Stand to Sit: with modified independence PT Goal: Stand to Sit - Progress: Goal set today Pt will Ambulate: >150 feet;with modified independence;with rolling walker PT Goal: Ambulate - Progress: Goal set today Pt will Perform Home Exercise Program: Independently PT Goal: Perform Home Exercise Program - Progress: Goal set today  Visit Information  Last PT Received On: 07/17/12 Assistance Needed: +2 (for safety/lines/leads, hopefully +1 soon) PT/OT Co-Evaluation/Treatment: Yes    Subjective Data  Subjective: Agreeable to OOB Patient Stated Goal: Would like  to go home   Prior Functioning  Home Living  Lives With: Alone Available Help at Discharge: Family;Available PRN/intermittently (possibly 24 hour assist if needed) Type of Home: House Home Access: Level entry Home Layout: One level Bathroom Shower/Tub: Engineer, manufacturing systems: Standard Home Adaptive Equipment: Walker - rolling;Shower chair with back;Straight cane;Bedside commode/3-in-1 Prior Function Level of Independence: Independent with assistive device(s) Driving: Yes Communication Communication: No difficulties    Cognition  Overall Cognitive Status: Appears within functional limits for tasks assessed/performed Arousal/Alertness: Awake/alert Orientation Level: Appears intact for tasks assessed Behavior During Session: [facility] for tasks performed    Extremity/Trunk Assessment Right Upper Extremity Assessment RUE ROM/Strength/Tone: [facility] for tasks assessed Left Upper Extremity Assessment LUE ROM/Strength/Tone: WFL for tasks assessed Right Lower Extremity Assessment RLE ROM/Strength/Tone: Deficits RLE ROM/Strength/Tone Deficits: Generally weak and painful, requiring physical assit with sit to stand and heavy dependence on UE assist Left Lower Extremity Assessment LLE ROM/Strength/Tone: Deficits;Due to pain LLE ROM/Strength/Tone Deficits: Generally weak and painful, requiring phsyical assist and dependence on UEs for sit to stand   Balance    End of Session PT - End of Session Activity Tolerance: Patient limited by pain;Patient tolerated treatment well Patient left: in chair;with call bell/phone within reach Nurse Communication: Mobility status  GP     Van Clines Ms [facility],  [phone]  07/17/2012, 10:31 AM

## 2012-07-17 NOTE — Evaluation (Signed)
Occupational Therapy Evaluation Patient Details Name: Joe Barry Barry MRN: [id number] DOB: January 03, 1934 Today's Date: 07/17/2012 Time: 4098-1191 OT Time Calculation (min): 22 min  OT Assessment / Plan / Recommendation Clinical Impression  Pt admitted with groin swelling and is now s/p repair of bilateral femoral false aneurysm evacuation of very large chronic hematoma.  Pt will benefit from acute OT services to address below problem list.  Recommending CIR to further maximize pt's independence and safety with ADLs before eventual d/c home.    OT Assessment  Patient needs continued OT Services    Follow Up Recommendations  Inpatient Rehab    Barriers to Discharge Other (comment) Dtr present and said she will be able to provide assist but unsure if she will be able to provide 24/7 assist.  Equipment Recommendations  None recommended by OT    Recommendations for Other Services Rehab consult  Frequency  Min 2X/week    Precautions / Restrictions     Pertinent Vitals/Pain See vitals    ADL  Eating/Feeding: Performed;Set up Where Assessed - Eating/Feeding: Bed level;Chair Upper Body Bathing: Simulated;Minimal assistance Where Assessed - Upper Body Bathing: Unsupported sitting Lower Body Bathing: Simulated;Maximal assistance Where Assessed - Lower Body Bathing: Supported sit to stand Upper Body Dressing: Simulated;Minimal assistance Where Assessed - Upper Body Dressing: Unsupported sitting Lower Body Dressing: Simulated;Maximal assistance Where Assessed - Lower Body Dressing: Supported sit to stand Toilet Transfer: Simulated;Moderate assistance Toilet Transfer Method: Stand pivot Toilet Transfer Equipment:  (bed to chair) Equipment Used: Rolling walker;Gait belt Transfers/Ambulation Related to ADLs: Mod assist for stand pivot transfer with RW from bed to chair.  ADL Comments: Limited ADL assessment due to pt wanting to finish breakfast.  Pt fatigued easily with bed mobility.      OT  Diagnosis: Generalized weakness;Acute pain  OT Problem List: Decreased strength;Impaired balance (sitting and/or standing);Decreased activity tolerance;Decreased knowledge of use of DME or AE;Pain OT Treatment Interventions: Self-care/ADL training;DME and/or AE instruction;Therapeutic activities;Patient/family education;Balance training   OT Goals Acute Rehab OT Goals OT Goal Formulation: With patient Time For Goal Achievement: 07/31/12 Potential to Achieve Goals: Good ADL Goals Pt Will Perform Grooming: with modified independence;Standing at sink ADL Goal: Grooming - Progress: Goal set today Pt Will Perform Upper Body Bathing: with modified independence;Sitting, edge of bed;Sitting, chair ADL Goal: Upper Body Bathing - Progress: Goal set today Pt Will Perform Lower Body Bathing: with modified independence;Sit to stand from chair;Sit to stand from bed ADL Goal: Lower Body Bathing - Progress: Goal set today Pt Will Perform Upper Body Dressing: with modified independence;Sitting, bed;Sitting, chair ADL Goal: Upper Body Dressing - Progress: Goal set today Pt Will Perform Lower Body Dressing: with modified independence;Sit to stand from chair;Sit to stand from bed ADL Goal: Lower Body Dressing - Progress: Goal set today Pt Will Transfer to Toilet: with modified independence;Ambulation;with DME;Comfort height toilet ADL Goal: Toilet Transfer - Progress: Goal set today Pt Will Perform Toileting - Clothing Manipulation: with modified independence;Standing ADL Goal: Toileting - Clothing Manipulation - Progress: Goal set today Pt Will Perform Toileting - Hygiene: with modified independence;Standing at 3-in-1/toilet;Sit to stand from 3-in-1/toilet ADL Goal: Toileting - Hygiene - Progress: Goal set today Pt Will Perform Tub/Shower Transfer: Tub transfer;Ambulation;with DME;Shower seat with back ADL Goal: Web designer - Progress: Goal set today Miscellaneous OT Goals Miscellaneous OT Goal #1:  Pt will be able to perform bed mobility with mod I in prep for EOB ADLs. OT Goal: Miscellaneous Goal #1 - Progress: Goal set today  Visit  Information  Last OT Received On: 07/17/12 Assistance Needed: +2 (for safety with lines and leads) PT/OT Co-Evaluation/Treatment: Yes    Subjective Data      Prior Functioning     Home Living Lives With: Alone Available Help at Discharge: Family;Available PRN/intermittently (possibly 24/7 assist if needed) Type of Home: House Home Access: Level entry Home Layout: One level Bathroom Shower/Tub: Engineer, manufacturing systems: Standard Home Adaptive Equipment: Walker - rolling;Shower chair with back;Straight cane;Bedside commode/3-in-1 Prior Function Level of Independence: Independent with assistive device(s) (occasional use of RW) Driving: Yes Communication Communication: No difficulties         Vision/Perception     Cognition  Overall Cognitive Status: Appears within functional limits for tasks assessed/performed Arousal/Alertness: Awake/alert Orientation Level: Appears intact for tasks assessed Behavior During Session: [facility] for tasks performed    Extremity/Trunk Assessment Right Upper Extremity Assessment RUE ROM/Strength/Tone: [facility] for tasks assessed Left Upper Extremity Assessment LUE ROM/Strength/Tone: [facility] for tasks assessed     Mobility Bed Mobility Bed Mobility: Supine to Sit;Sitting - Scoot to Edge of Bed Supine to Sit: 3: Mod assist;HOB elevated;With rails Sitting - Scoot to Edge of Bed: 3: Mod assist Details for Bed Mobility Assistance: Mod assist to support trunk OOB (pt leaning posteriorly).  Cueing for hand placement on rails and sequencing. Transfers Transfers: Sit to Stand;Stand to Sit Sit to Stand: 3: Mod assist;From bed;With upper extremity assist Stand to Sit: 4: Min assist;To chair/3-in-1;With armrests;With upper extremity assist Details for Transfer Assistance: Assist for anterior weight shift in order to  facilitate lift off from bed and to provide steadying once in full upright standing position. Cueing for safe hand placement.     Shoulder Instructions     Exercise     Balance     End of Session OT - End of Session Equipment Utilized During Treatment: Gait belt (RW) Activity Tolerance: Patient limited by fatigue Patient left: in chair;with call bell/phone within reach Nurse Communication: Mobility status  GO    07/17/2012 Cipriano Mile OTR/L Pager [phone] Office [phone]  Shahram, Alexopoulos 07/17/2012, 10:18 AM

## 2012-07-17 NOTE — Progress Notes (Signed)
Subjective:  Interval History: none.. comfortable with pain well-controlled  Objective: Vital signs in last 24 hours: Temp:  [97.9 F (36.6 C)-100 F (37.8 C)] 98.5 F (36.9 C) (11/03 0300) Pulse Rate:  [66-93] 81  (11/03 0500) Resp:  [11-24] 16  (11/03 0400) BP: (85-159)/(40-72) 102/50 mmHg (11/03 0500) SpO2:  [90 %-100 %] 96 % (11/03 0300) Arterial Line BP: (95-157)/(30-61) 116/61 mmHg (11/03 0300)  Intake/Output from previous day: 11/02 0701 - 11/03 0700 In: 5325 [I.V.:4900; Blood:425] Out: 2835 [Urine:900; Drains:235; Blood:1700] Intake/Output this shift:    Both incisions and dressings dry and intact. No reaccumulation of fluid in the pelvis. Drain with moderate output. 2+ pedal pulses on the left. Right foot warm and well perfused.  Lab Results:  [name] 07/17/12 0312 07/16/12 1214 07/15/12 1848  WBC 11.2* -- 6.0  HGB 8.8* 8.8* --  HCT 27.0* 26.0* --  PLT 235 -- 266   BMET  Basename 07/17/12 0312 07/16/12 1214 07/15/12 1848  NA 130* 136 --  K 4.2 4.1 --  CL 99 -- 102  CO2 26 -- 25  GLUCOSE 217* 147* --  BUN 18 -- 17  CREATININE 0.98 -- 0.84  CALCIUM 7.8* -- 9.6    Studies/Results: Ct Abdomen Pelvis W Contrast  07/15/2012  *RADIOLOGY REPORT*  Clinical Data: Groin swelling.  Lower abdominal pain.  CT ABDOMEN AND PELVIS WITH CONTRAST  Technique:  Multidetector CT imaging of the abdomen and pelvis was performed following the standard protocol during bolus administration of intravenous contrast.  Contrast: 80mL OMNIPAQUE IOHEXOL 300 MG/ML  SOLN  Comparison: None.  Findings: Lung bases are clear.  No effusions.  Heart is normal size.  Prior cholecystectomy.  Liver, spleen, pancreas, left kidney unremarkable.  2.9 cm exophytic cystic lesion off the mid pole of the right kidney which appears benign.  No hydronephrosis.  Descending colonic and sigmoid diverticulosis.  No active diverticulitis.  Small bowel is decompressed.  Aorta is normal caliber.  Enlargement of the  prostate.  Bladder grossly unremarkable.  Large complex mixed density mass and/or fluid collection noted within the left superior scrotum and groin.   This extends over into the right groin.  The mass is in the proximity of both femoral vessels.  Surgical clips are seen in the right groin.  Question prior fem-fem crossover graft with rupture and large hematoma. Alternatively, this could be a ruptured pseudoaneurysm although I am uncertain why this extends from the femoral artery to femoral artery.  Active extravasation of contrast noted, most pronounced on the left.  Contrast density material is noted within this large hematoma and abuts the left common femoral artery.  This hematoma measures 20 x 10 cm on image 86. There is a also extension into the scrotum.  Bilateral hydroceles present.  Large and small bowel grossly unremarkable.  No free fluid, free air or adenopathy.  IMPRESSION: Large hematoma noted within the lower pelvic wall extending from groin to growing.  Areas of active extravasation noted bilaterally, greater on the left.  Question rupture of a prior fem-fem bypass graft or ruptured pseudoaneurysm.  There is extension into the scrotum.  Enlarged prostate.  These results were called to Fayrene Helper at the time of interpretation.   Original Report Authenticated By: Charlett Nose, M.D.    Dg Chest Port 1 View  07/16/2012  *RADIOLOGY REPORT*  Clinical Data: Central line placement.  PORTABLE CHEST - 1 VIEW  Comparison: Chest x-ray 03/16/2011.  Findings: Interval placement of right IJ vas cath with  tip terminating in the distal superior vena cava.  Lung volumes are low.  No definite pneumothorax.  Ill-defined interstitial and patchy air space disease in the medial aspect of the right upper and lower lung, concerning for developing infection.  The left lung is clear.  No pleural effusions.  No evidence of pulmonary edema. Heart size is normal. The patient is rotated to the left on today's exam, resulting in  distortion of the mediastinal contours and reduced diagnostic sensitivity and specificity for mediastinal pathology.  Atherosclerosis in the thoracic aorta.  IMPRESSION: 1.  Tip of vas cath is in the distal superior vena cava. No pneumothorax. 2.  New ill-defined interstitial and airspace opacities in the medial aspect of the right lung are concerning for multilobar pneumonia.  Clinical correlation is recommended. 3.  Atherosclerosis.   Original Report Authenticated By: Trudie Reed, M.D.    Anti-infectives: Anti-infectives     Start     Dose/Rate Route Frequency Ordered Stop   07/16/12 2100   cefUROXime (ZINACEF) 1.5 g in dextrose 5 % 50 mL IVPB        1.5 g 100 mL/hr over 30 Minutes Intravenous Every 12 hours 07/16/12 1529 07/17/12 2059          Assessment/Plan: s/p Procedure(s) (LRB) with comments: REPAIR FALSE ANEURYSM (Bilateral) - false femoral aneurysm repair with femoral artery repair Stable postop day 1. Hemoglobin and hematocrit stable. Will transfer to unit 2000 and began to mobilize. DC Foley.   LOS: 2 days   Joe Barry 07/17/2012, 8:33 AM

## 2012-07-17 NOTE — Progress Notes (Signed)
Foley D/C'd at 1000, per MD order. Pt tolerated will. Will continue to monitor urine output.

## 2012-07-17 NOTE — Progress Notes (Signed)
Pt transferred to 2004, per MD order. Report called to receiving nurse and all questions answered. Family aware of transfer.

## 2012-07-18 ENCOUNTER — Encounter (HOSPITAL_COMMUNITY): Payer: Self-pay | Admitting: Vascular Surgery

## 2012-07-18 DIAGNOSIS — Z48812 Encounter for surgical aftercare following surgery on the circulatory system: Secondary | ICD-10-CM

## 2012-07-18 LAB — GLUCOSE, CAPILLARY: Glucose-Capillary: 229 mg/dL — ABNORMAL HIGH (ref 70–99)

## 2012-07-18 NOTE — Progress Notes (Signed)
VASCULAR LAB PRELIMINARY  ARTERIAL  ABI completed: Brachial pressures abnormally elevated (according to RN), therefore not giving adequate ABIs. However, waveforms suggest adequate flow post OP.    RIGHT    LEFT    PRESSURE WAVEFORM  PRESSURE WAVEFORM  BRACHIAL 233 T BRACHIAL 205 B-T  DP   DP    AT 93  M AT 169 B  PT   PT    PER 88 M-B PER 169 B  GREAT TOE  NA GREAT TOE  NA    RIGHT LEFT  ABI 0.40 0.73     Joe Barry, 07/18/2012, 10:33 AM

## 2012-07-18 NOTE — Progress Notes (Signed)
Occupational Therapy Treatment Patient Details Name: Joe Barry MRN: [id number] DOB: Jul 25, 1934 Today's Date: 07/18/2012 Time: 7829-5621 OT Time Calculation (min): 26 min  OT Assessment / Plan / Recommendation Comments on Treatment Session Pt overall close supervision for mobility and functional transfers.  Encouraged him to have his sister come and stay with him initially after discharge home to provide some level of supervision.  Has all OT related DME already.  Feel he will benefit from [facility] at discharge for safety eval.    Follow Up Recommendations  Home health OT       Equipment Recommendations  None recommended by OT       Frequency Min 2X/week   Plan Discharge plan needs to be updated    Precautions / Restrictions Precautions Precautions: Fall Restrictions Weight Bearing Restrictions: No   Pertinent Vitals/Pain No report of pain during session    ADL  Grooming: Performed;Supervision/safety Where Assessed - Grooming: Supported standing Toilet Transfer: Performed;Min guard Statistician Method: Surveyor, minerals: Materials engineer and Hygiene: Performed;Min guard Where Assessed - Engineer, mining and Hygiene: Sit to stand from 3-in-1 or toilet Equipment Used: Rolling walker Transfers/Ambulation Related to ADLs: Pt was overall close supervision for mobility around the room during toileting and grooming tasks with his RW. ADL Comments: Pt overall close supervision for mobility and functional transfers using the RW and appropriate 3:1.  Discussed use of a walker bag and flat surfaces for transporting objects around the kitchen as well.  Pt reports that he may get his sister to come stay with him initially when he is discharged home.  Therapist encouraged him to do this.        OT Goals Acute Rehab OT Goals OT Goal Formulation: With patient ADL Goals ADL Goal: Toilet Transfer - Progress: Progressing  toward goals ADL Goal: Toileting - Clothing Manipulation - Progress: Progressing toward goals ADL Goal: Toileting - Hygiene - Progress: Progressing toward goals  Visit Information  Last OT Received On: 07/18/12 Assistance Needed: +1    Subjective Data  Subjective: "I just might get my sister to come in and help me." Patient Stated Goal: To get back to normal as fast as possible.   Prior Functioning       Cognition  Overall Cognitive Status: Appears within functional limits for tasks assessed/performed Arousal/Alertness: Awake/alert Orientation Level: Appears intact for tasks assessed Behavior During Session: [facility] for tasks performed    Mobility  Shoulder Instructions Bed Mobility Bed Mobility: Sit to Supine Sit to Supine: 5: Supervision;HOB flat          Balance Balance Balance Assessed: Yes Static Standing Balance Static Standing - Balance Support: Right upper extremity supported;Left upper extremity supported Static Standing - Level of Assistance: 5: Stand by assistance   End of Session OT - End of Session Activity Tolerance: Patient tolerated treatment well Patient left: in bed;with call bell/phone within reach;with nursing in room Nurse Communication: Mobility status     Joe Barry OTR/L Pager number [phone]  07/18/2012, 2:44 PM

## 2012-07-18 NOTE — Progress Notes (Addendum)
[facility] Progress Note  07/18/2012 7:32 AM POD 2    Tm 101.8 now 98.7 Otherwise VSS 98%RA  Filed Vitals:   07/18/12 0645  BP: 134/70  Pulse: 89  Temp: 98.7 F (37.1 C)  Resp: 18     CBC    Component Value Date/Time   WBC 11.2* 07/17/2012 0312   RBC 3.34* 07/17/2012 0312   HGB 8.8* 07/17/2012 0312   HCT 27.0* 07/17/2012 0312   PLT 235 07/17/2012 0312   MCV 80.8 07/17/2012 0312   MCH 26.3 07/17/2012 0312   MCHC 32.6 07/17/2012 0312   RDW 14.7 07/17/2012 0312   LYMPHSABS 1.5 07/15/2012 1848   MONOABS 0.6 07/15/2012 1848   EOSABS 0.1 07/15/2012 1848   BASOSABS 0.0 07/15/2012 1848    BMET    Component Value Date/Time   NA 130* 07/17/2012 0312   K 4.2 07/17/2012 0312   CL 99 07/17/2012 0312   CO2 26 07/17/2012 0312   GLUCOSE 217* 07/17/2012 0312   BUN 18 07/17/2012 0312   CREATININE 0.98 07/17/2012 0312   CREATININE 0.93 01/21/2012 1102   CALCIUM 7.8* 07/17/2012 0312   GFRNONAA 78* 07/17/2012 0312   GFRAA >90 07/17/2012 0312    INR    Component Value Date/Time   INR 1.07 07/16/2012 0641     Intake/Output Summary (Last 24 hours) at 07/18/12 0732 Last data filed at 07/18/12 0700  Gross per 24 hour  Intake   1370 ml  Output    565 ml  Net    805 ml     Assessment/Plan:  78 y.o. male is s/p Repair of bilateral femoral false aneurysm evacuation of very large chronic hematoma POD 2  -temp elevated last am-will mobilize pt -IS 10x/hr while awake -acute surgical blood loss anemia-pt tolerating.  Also JW and therefore will not accept blood products.  Doreatha Massed, PA-C [facility] [phone] 07/18/2012 7:32 AM    I have examined the patient, reviewed and agree with above. Both groins healing well.  ABI noted. DC central line andPo and and andn AMssible dc i.   Sterlin Knightly, MD 07/18/2012 12:22 PM

## 2012-07-18 NOTE — Progress Notes (Signed)
Dry 4x4 gauze applied to bilateral groins per MD order. Site clean and WNL. Dion Saucier

## 2012-07-18 NOTE — Progress Notes (Signed)
Thank you for consult on Mr. Joe [name]  Chart reviewed and note that patient had pseudoaneurysm repair on 11/02 with improvement in symptoms.  He did well with therapies this afternoon.  He was close supervision for all mobility.  He states that he has a sister who can stay with him past discharge. Would recommend HHPT/HHOT past discharge.  Will defer CIR consult for now.

## 2012-07-18 NOTE — Progress Notes (Signed)
Physical Therapy Treatment Patient Details Name: Joe Barry MRN: [id number] DOB: [birth year] Today's Date: 07/18/2012 Time: 4098-1191 PT Time Calculation (min): 23 min  PT Assessment / Plan / Recommendation Comments on Treatment Session  Pt s/p Bil femoral pseudoaneuryms with decr mobility secondary to decr endurance and decr postural stability.  Will benefit from continued PT to address safety and endurance to eventual d/c home.      Follow Up Recommendations  Post acute inpatient     Does the patient have the potential to tolerate intense rehabilitation  Yes, Recommend IP Rehab Screening  Barriers to Discharge        Equipment Recommendations  None recommended by PT    Recommendations for Other Services    Frequency Min 3X/week   Plan Discharge plan remains appropriate;Frequency remains appropriate    Precautions / Restrictions Precautions Precautions: Fall Restrictions Weight Bearing Restrictions: No   Pertinent Vitals/Pain VSS, No pain    Mobility  Bed Mobility Bed Mobility: Not assessed Transfers Transfers: Sit to Stand;Stand to Sit Sit to Stand: 4: Min assist;With upper extremity assist;With armrests;From chair/3-in-1 Stand to Sit: 4: Min assist;With upper extremity assist;With armrests;To chair/3-in-1 Details for Transfer Assistance: Assist for anterior weight shift in order to facilitate upright standing from recliner.  Cues for safe hand placement.    Ambulation/Gait Ambulation/Gait Assistance: 4: Min assist Ambulation Distance (Feet): 175 Feet Assistive device: Rolling walker Ambulation/Gait Assistance Details: Cues for safety with RW and to position self safely in RW for ambulation.  Doing much better today.  Still with slight flexed posture.   Gait Pattern: Decreased stride length;Decreased step length - right;Decreased step length - left;Wide base of support;Trunk flexed Gait velocity: decreased Stairs: No Wheelchair Mobility Wheelchair Mobility: No    Exercises General Exercises - Lower Extremity Ankle Circles/Pumps: AROM;Both;Seated;5 reps Long Arc Quad: AROM;Both;10 reps;Seated Hip Flexion/Marching: AROM;Both;10 reps;Seated   PT Goals Acute Rehab PT Goals PT Goal: Sit to Stand - Progress: Progressing toward goal PT Goal: Stand to Sit - Progress: Progressing toward goal PT Goal: Ambulate - Progress: Progressing toward goal PT Goal: Perform Home Exercise Program - Progress: Progressing toward goal  Visit Information  Last PT Received On: 07/18/12 Assistance Needed: +1    Subjective Data  Subjective: "I am feeling better."   Cognition  Overall Cognitive Status: Appears within functional limits for tasks assessed/performed Arousal/Alertness: Awake/alert Orientation Level: Appears intact for tasks assessed Behavior During Session: [facility]: Lambert Rhonda W for tasks performed    Balance  Static Standing Balance Static Standing - Balance Support: Bilateral upper extremity supported;During functional activity Static Standing - Level of Assistance: 5: Stand by assistance Static Standing - Comment/# of Minutes: 2 minutes   End of Session PT - End of Session Equipment Utilized During Treatment: Gait belt Activity Tolerance: Patient tolerated treatment well Patient left: in chair;with call bell/phone within reach Nurse Communication: Mobility status       INGOLD,Bethanee Redondo 07/18/2012, 10:12 AM  [facility] [phone] (pager)

## 2012-07-18 NOTE — Progress Notes (Signed)
Rehab Admissions Coordinator Note:  Patient was screened by Meryl Dare for appropriateness for an Inpatient Acute Rehab Consult.  At this time, we are recommending Inpatient Rehab consult.  Meryl Dare 07/18/2012, 8:55 AM  I can be reached at [phone].

## 2012-07-18 NOTE — Progress Notes (Signed)
Central line d/c per MD order. Site WNL. Pressure applied for 5 minutes. Pressure dressing applied to site. Pt tolerated well. Bed rest for 30 minutes. Call bell in reach. Dion Saucier

## 2012-07-19 MED ORDER — OXYCODONE HCL 5 MG PO TABS: 5.0000 mg | ORAL_TABLET | ORAL | Status: DC | PRN
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
## 2012-07-19 NOTE — Care Management Note (Signed)
    Page 1 of 1   07/19/2012     3:33:30 PM   CARE MANAGEMENT NOTE 07/19/2012  Patient:  Joe Barry, Joe Barry   Account Number:  [id number]  Date Initiated:  07/19/2012  Documentation initiated by:  Anajah Sterbenz  Subjective/Objective Assessment:   PT S/P REPAIR OF BILATERAL FEMORAL FALSE ANEURYSM AND EVACUATION OF LARGE CHRONIC HEMATOMA.  PTA, PT INDEPENDENT, LIVES WITH DAUGHTER.     Action/Plan:   MET WITH PT AND DAUGHTER TO DISCUSS DC PLANS.  WILL NEED HH FOLLOW UP.  HOME HEALTH CARE ARRANGED WITH [facility], PER DAUGHTER CHOICE.  START OF CARE 24-48H POST DC DATE.   Anticipated DC Date:  07/19/2012   Anticipated DC Plan:  HOME W HOME HEALTH SERVICES      DC Planning Services  CM consult      [facility] Choice  HOME HEALTH   Choice offered to / List presented to:  C-1 Patient        HH arranged  HH-1 RN  HH-2 PT  HH-3 OT      [facility] agency  [facility]   Status of service:  Completed, signed off Medicare Important Message given?   (If response is "NO", the following Medicare IM given date fields will be blank) Date Medicare IM given:   Date Additional Medicare IM given:    Discharge Disposition:  HOME W HOME HEALTH SERVICES  Per UR Regulation:  Reviewed for med. necessity/level of care/duration of stay  If discussed at Long Length of Stay Meetings, dates discussed:    Comments:

## 2012-07-19 NOTE — Progress Notes (Signed)
Discharge instructions given to pt and pt's daughter along with prescriptions. Educated pt about how to drain JP drain and recharge it. Pt verbalized his understanding. Gave pt graduated cylinder to measure output and explained that he needs to record his output for Dr. Arbie Cookey for his follow up appointment. Pt is stable and ready for discharge with daughter.

## 2012-07-20 MED FILL — Sodium Chloride IV Soln 0.9%: INTRAVENOUS | Qty: 4000 | Status: AC

## 2012-07-20 MED FILL — Sodium Chloride Irrigation Soln 0.9%: Qty: 3000 | Status: AC

## 2012-07-20 MED FILL — Heparin Sodium (Porcine) Inj 1000 Unit/ML: INTRAMUSCULAR | Qty: 60 | Status: AC

## 2012-07-21 ENCOUNTER — Telehealth: Payer: Self-pay | Admitting: Vascular Surgery

## 2012-07-21 NOTE — Telephone Encounter (Signed)
Message copied by Margaretmary Eddy on Thu Jul 21, 2012 10:32 AM ------      Message from: Sharee Pimple      Created: Tue Jul 19, 2012  8:31 AM                   ----- Message -----         From: Dara Lords, PA         Sent: 07/19/2012   7:48 AM           To: Sharee Pimple, CMA             Repair of bilateral femoral false aneurysm evacuation of very large chronic hematoma                  By TFE      F/u with him in 1 week.  Pt will have a drain and staples.            Thanks,      Lelon Mast

## 2012-07-25 ENCOUNTER — Encounter: Payer: Self-pay | Admitting: Vascular Surgery

## 2012-07-26 ENCOUNTER — Ambulatory Visit (INDEPENDENT_AMBULATORY_CARE_PROVIDER_SITE_OTHER): Payer: Medicare Other | Admitting: Vascular Surgery

## 2012-07-26 ENCOUNTER — Encounter: Payer: Self-pay | Admitting: Vascular Surgery

## 2012-07-26 VITALS — BP 120/67 | HR 73 | Resp 20 | Ht 65.0 in | Wt 187.0 lb

## 2012-07-26 DIAGNOSIS — I729 Aneurysm of unspecified site: Secondary | ICD-10-CM

## 2012-07-26 NOTE — Progress Notes (Signed)
The patient presents today for followup of urgent repair of a huge left femoral and moderate right femoral false aneurysm. He had presented to the hospital with progressive enlargement of this area over his pre-pubic area. He had a prior fem-fem bypass for many decades prior which it completely degenerated. He had a chronic false aneurysm with expansion and active flow into this area. He underwent bilateral groin exploration and evacuation of this large false aneurysm. He does have a JP P. drain which the family is emptying. He has had significant continued output from this due to the large cavity that is evacuating. He is walking at his baseline.  Physical exam both groin incisions are healing quite nicely the staples will be removed today. He does have a palpable left dorsalis pedis pulse. He does not have pulse in the right with known right iliac occlusion.  I think he is doing quite well overall. 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 He will continue to evacuating the [name]-Pratt drain for one additional week. We will see him in one week with probable removal of this. The drainage is serous fluid.

## 2012-08-01 ENCOUNTER — Encounter: Payer: Self-pay | Admitting: Vascular Surgery

## 2012-08-02 ENCOUNTER — Ambulatory Visit (INDEPENDENT_AMBULATORY_CARE_PROVIDER_SITE_OTHER): Payer: Medicare Other | Admitting: Vascular Surgery

## 2012-08-02 ENCOUNTER — Encounter: Payer: Self-pay | Admitting: Vascular Surgery

## 2012-08-02 VITALS — BP 126/65 | HR 88 | Temp 98.7°F | Ht 66.0 in | Wt 184.0 lb

## 2012-08-02 DIAGNOSIS — I729 Aneurysm of unspecified site: Secondary | ICD-10-CM

## 2012-08-02 NOTE — Progress Notes (Signed)
Patient is here today for continued followup of his emergent repair of large bilateral femoral false aneurysms. He was seen last week and was continued to have serous drainage from his JP drain. He is here today for [name]-Pratt drain removal. He does have some cloudy fluid rhythms clear serous fluid and this is of minimal output. He has no erythema and has good healing of his groin incisions. He will have staples removed from the right groin today they removed from the left groin last week let us JP out and will see [place] again in one month

## 2012-08-16 ENCOUNTER — Telehealth: Payer: Self-pay | Admitting: Family Medicine

## 2012-08-16 ENCOUNTER — Telehealth: Payer: Self-pay

## 2012-08-16 NOTE — Telephone Encounter (Signed)
[name] called and states that the home health nurse keeps giving her dad sheets on CHF and telling him to weigh everyday.  [name] states she has never been told her dad has CHF.  I scanned the chart and do not see CHF.  Please advise [name]

## 2012-08-16 NOTE — Telephone Encounter (Signed)
Daughter called to report that pt. Continues to have a lot of drainage left groin, at site where drain tube was removed.  Denies any odor.  Describes "a yellow film in the open hole where the drainage tube was."  Denies any redness or inflammation surrounding open area.  States there is "so much drainage that it saturates his clothing and bed linen."  Denies fever or chills.  Discussed w/ Dr. Arbie Cookey.  Advised to have pt. Follow-up w/ him in office on 08/23/12.  Will notify pt. With appt. Time.

## 2012-08-16 NOTE — Telephone Encounter (Signed)
Spoke with patients daughter, [name]. Pt is scheduled for 08/23/12 @ 3:45pm. dm

## 2012-08-22 ENCOUNTER — Encounter: Payer: Self-pay | Admitting: Vascular Surgery

## 2012-08-23 ENCOUNTER — Encounter: Payer: Self-pay | Admitting: Vascular Surgery

## 2012-08-23 ENCOUNTER — Ambulatory Visit (INDEPENDENT_AMBULATORY_CARE_PROVIDER_SITE_OTHER): Payer: Medicare Other | Admitting: Vascular Surgery

## 2012-08-23 VITALS — BP 157/67 | HR 73 | Resp 16 | Ht 66.0 in | Wt 185.1 lb

## 2012-08-23 DIAGNOSIS — Z48812 Encounter for surgical aftercare following surgery on the circulatory system: Secondary | ICD-10-CM

## 2012-08-23 NOTE — Progress Notes (Signed)
Patient presents today for continued followup of an emergent repair of very large bilateral false aneurysms in his groins. He had a JP drain in the left groin with prolonged drainage. This has been stopped and his groin his JP was removed. He presents today with concern regarding wound in his left groin with serous drainage. On physical exam he does have an area of fat necrosis and this was debrided in the office. This does not appear to track down through the patch over his femoral artery. I debrided the fat necrosis and the skin the back to healthy tissue. This was packed with a 4x4 he will begin having wet-to-dry dressing changes starting tomorrow through home health nurse. I will see him again in one week for continued followup. There is no surrounding erythema or evidence of infection

## 2012-08-24 ENCOUNTER — Other Ambulatory Visit: Payer: Self-pay | Admitting: *Deleted

## 2012-08-24 DIAGNOSIS — I739 Peripheral vascular disease, unspecified: Secondary | ICD-10-CM

## 2012-08-29 ENCOUNTER — Encounter: Payer: Self-pay | Admitting: Vascular Surgery

## 2012-08-30 ENCOUNTER — Encounter: Payer: Self-pay | Admitting: Vascular Surgery

## 2012-08-30 ENCOUNTER — Other Ambulatory Visit: Payer: Self-pay | Admitting: Vascular Surgery

## 2012-08-30 ENCOUNTER — Ambulatory Visit (INDEPENDENT_AMBULATORY_CARE_PROVIDER_SITE_OTHER): Payer: Medicare Other | Admitting: Vascular Surgery

## 2012-08-30 VITALS — BP 148/69 | HR 95 | Temp 98.6°F | Ht 66.0 in | Wt 181.0 lb

## 2012-08-30 DIAGNOSIS — I729 Aneurysm of unspecified site: Secondary | ICD-10-CM

## 2012-08-30 NOTE — Progress Notes (Signed)
The patient presents for continued followup of his repair of large false aneurysm. He had debridement of fat necrosis from his left groin last week. The edges of this appear cleaner. He does have some cloudy fluid drainage from this. This is expressed on pushing on his groin area. There is diffuse edema over his pannus but no erythema. The patient has not had fevers. The drainage is foul-smelling. This was sent for culture and sensitivity and he started on Keflex 500 mg 3 times a day #40. He will see our nurse practitioner next week in my absence and I will see him in 2 weeks. We will make changes in his antibiotic regimen pending his culture and sensitivity. He will continue to keep this area wicked opened

## 2012-09-02 LAB — WOUND CULTURE

## 2012-09-05 ENCOUNTER — Encounter: Payer: Self-pay | Admitting: Neurosurgery

## 2012-09-06 ENCOUNTER — Encounter: Payer: Self-pay | Admitting: Neurosurgery

## 2012-09-06 ENCOUNTER — Ambulatory Visit (INDEPENDENT_AMBULATORY_CARE_PROVIDER_SITE_OTHER): Payer: Medicare Other | Admitting: Neurosurgery

## 2012-09-06 VITALS — BP 136/69 | HR 71 | Temp 97.0°F | Resp 16 | Ht 64.0 in | Wt 179.0 lb

## 2012-09-06 DIAGNOSIS — I729 Aneurysm of unspecified site: Secondary | ICD-10-CM

## 2012-09-06 DIAGNOSIS — Z09 Encounter for follow-up examination after completed treatment for conditions other than malignant neoplasm: Secondary | ICD-10-CM

## 2012-09-06 NOTE — Progress Notes (Signed)
Subjective:     Patient ID: Joe Barry, male   DOB: 06/07/1934, 78 y.o.   MRN: [id number]  HPI: 78 year old male patient of Dr. Arbie Cookey who is status post repair of a false aneurysm. The patient comes in for a wound check, he was seen by Dr. Arbie Cookey last week. The patient and his family report lessening symptoms and decreasing drainage. The patient reports no problems at this point   Review of Systems: 12 point review of systems is notable for the difficulties described above otherwise unremarkable     Objective:   Physical Exam: Afebrile, vital signs are stable, the wound is healing by second intention, the patient has a mild yellow clear drainage and I cannot express any more than was on the 4 x 4 covering the wound. Is no redness and no foul odor.     Assessment:     Healing left groin wound    Plan:     I did review the sensitivity report and the Keflex should be adequate, the patient will finish his current Keflex dose which should end on Sunday. He will return on Tuesday of next week to see Dr. Arbie Cookey. We will continue the current medical management plan. The patient and his family's questions were encouraged and answered, they are in agreement with this plan.  Lauree Chandler ANP  Clinic M.D. [place] on call

## 2012-09-12 ENCOUNTER — Encounter: Payer: Self-pay | Admitting: Vascular Surgery

## 2012-09-13 ENCOUNTER — Ambulatory Visit (INDEPENDENT_AMBULATORY_CARE_PROVIDER_SITE_OTHER): Payer: Medicare Other | Admitting: Vascular Surgery

## 2012-09-13 ENCOUNTER — Ambulatory Visit: Payer: Medicare Other | Admitting: Vascular Surgery

## 2012-09-13 ENCOUNTER — Encounter: Payer: Self-pay | Admitting: Vascular Surgery

## 2012-09-13 VITALS — BP 130/55 | HR 86 | Resp 18 | Ht 65.0 in | Wt 182.7 lb

## 2012-09-13 DIAGNOSIS — S31109A Unspecified open wound of abdominal wall, unspecified quadrant without penetration into peritoneal cavity, initial encounter: Secondary | ICD-10-CM

## 2012-09-13 DIAGNOSIS — Z48812 Encounter for surgical aftercare following surgery on the circulatory system: Secondary | ICD-10-CM

## 2012-09-13 NOTE — Progress Notes (Signed)
Today for continued followup from repair of acute false aneurysm both groins from the old nonfunctional fem-fem bypass. He had had purulent drainage from the left groin I had debrided this started on Keflex with culture and sensitivity showing this as appropriate treatment. He is here today for followup. He is is minimal drainage very small punctate area with no evidence of fluctuance in the left groin. He now said separation the upper pole of his incision on the right and does have some mild drainage from this. He continues to have induration her the entire suprapubic area. There does appear to be some fluid in the right groin. He has had no fevers. He does not appear toxic.  We will continue on Keflex has been written a prescription today also has been written a prescription for Ultram 50 mg 1-2 to every 4 hours when necessary pain. We will obtain a CT of his abdomen and pelvis and followup in one week to rule out any undrained fluid.

## 2012-09-15 NOTE — Addendum Note (Signed)
Addended by: Sharee Pimple on: 09/15/2012 10:25 AM   Modules accepted: Orders

## 2012-09-16 ENCOUNTER — Other Ambulatory Visit: Payer: Self-pay | Admitting: *Deleted

## 2012-09-16 DIAGNOSIS — Z48812 Encounter for surgical aftercare following surgery on the circulatory system: Secondary | ICD-10-CM

## 2012-09-16 DIAGNOSIS — R1900 Intra-abdominal and pelvic swelling, mass and lump, unspecified site: Secondary | ICD-10-CM

## 2012-09-16 DIAGNOSIS — I739 Peripheral vascular disease, unspecified: Secondary | ICD-10-CM

## 2012-09-19 ENCOUNTER — Other Ambulatory Visit: Payer: Self-pay | Admitting: Vascular Surgery

## 2012-09-19 ENCOUNTER — Encounter: Payer: Self-pay | Admitting: Vascular Surgery

## 2012-09-19 ENCOUNTER — Other Ambulatory Visit: Payer: Self-pay | Admitting: *Deleted

## 2012-09-19 DIAGNOSIS — Z48812 Encounter for surgical aftercare following surgery on the circulatory system: Secondary | ICD-10-CM

## 2012-09-19 DIAGNOSIS — R188 Other ascites: Secondary | ICD-10-CM

## 2012-09-19 LAB — CREATININE, SERUM: Creat: 0.98 mg/dL (ref 0.50–1.35)

## 2012-09-20 ENCOUNTER — Inpatient Hospital Stay: Admission: RE | Admit: 2012-09-20 | Payer: Medicare Other | Source: Ambulatory Visit

## 2012-09-20 ENCOUNTER — Encounter: Payer: Self-pay | Admitting: Vascular Surgery

## 2012-09-20 ENCOUNTER — Ambulatory Visit (INDEPENDENT_AMBULATORY_CARE_PROVIDER_SITE_OTHER): Payer: Medicare PPO | Admitting: Vascular Surgery

## 2012-09-20 ENCOUNTER — Ambulatory Visit
Admission: RE | Admit: 2012-09-20 | Discharge: 2012-09-20 | Disposition: A | Discharge status: Home / Self Care | Payer: Medicare PPO | Source: Ambulatory Visit | Attending: Vascular Surgery | Admitting: Vascular Surgery

## 2012-09-20 ENCOUNTER — Other Ambulatory Visit: Payer: Medicare Other

## 2012-09-20 ENCOUNTER — Other Ambulatory Visit: Payer: Self-pay

## 2012-09-20 VITALS — BP 134/67 | HR 74 | Resp 18 | Ht 65.0 in | Wt 182.5 lb

## 2012-09-20 DIAGNOSIS — Z48812 Encounter for surgical aftercare following surgery on the circulatory system: Secondary | ICD-10-CM

## 2012-09-20 DIAGNOSIS — I729 Aneurysm of unspecified site: Secondary | ICD-10-CM

## 2012-09-20 DIAGNOSIS — R188 Other ascites: Secondary | ICD-10-CM

## 2012-09-20 DIAGNOSIS — S31109A Unspecified open wound of abdominal wall, unspecified quadrant without penetration into peritoneal cavity, initial encounter: Secondary | ICD-10-CM

## 2012-09-20 MED ORDER — IOHEXOL 300 MG/ML  SOLN
100.0000 mL | Freq: Once | INTRAMUSCULAR | Status: AC | PRN
  Administered 2012-09-20: 100 mL via INTRAVENOUS

## 2012-09-20 NOTE — Progress Notes (Signed)
The patient presents today for continued followup of repair of resection of a very large false aneurysm that extended from his left groin to his right groin. Surgery was on 07/16/2012. He had an old nonfunctional fem-fem graft for many years ago. He presented with a very large area over his entire pre-pubic area and both groins with CT showing flow into large false aneurysms bilaterally. He did well after resection of this and replacement with a small Dacron patch over both groins. He did have a drain in his left groin that eventually was removed. Subsequently he has had complete resolution of any drainage from his left groin is now had drainage from his right groin was some fluctuance and also some breakdown over inflamed pre-pubic area. Last week when I saw him and recommended a CT for further evaluation and he is here today for discussion of this.  Past Medical History  Diagnosis Date  . Hypertension   . Arthritis   . BPH (benign prostatic hypertrophy)   . Cataract   . Dyslipidemia   . No blood products   . Refusal of blood transfusions as patient is Jehovah's Witness   . Diabetes mellitus     History  Substance Use Topics  . Smoking status: Former Smoker    Types: Cigarettes    Quit date: 07/26/1972  . Smokeless tobacco: Never Used  . Alcohol Use: Yes     Comment: occasional    Family History  Problem Relation Age of Onset  . Cancer Mother     ovarian  . Diabetes Father     No Known Allergies  Current outpatient prescriptions:Blood Glucose Monitoring Suppl (FORA V12 BLOOD GLUCOSE SYSTEM) DEVI, , Disp: , Rfl: ;  brimonidine (ALPHAGAN) 0.2 % ophthalmic solution, Place 1 drop into the right eye 2 (two) times daily., Disp: 5 mL, Rfl: ;  cephALEXin (KEFLEX) 500 MG capsule, , Disp: , Rfl: ;  colchicine 0.6 MG tablet, Take 0.6 mg by mouth daily as needed. For gout, Disp: , Rfl: ;  FORA V12 BLOOD GLUCOSE TEST test strip, , Disp: , Rfl:  hydrochlorothiazide (MICROZIDE) 12.5 MG capsule,  Take 1 capsule (12.5 mg total) by mouth daily., Disp: 30 capsule, Rfl: 11;  ibuprofen (ADVIL,MOTRIN) 200 MG tablet, Take 200 mg by mouth every 6 (six) hours as needed., Disp: , Rfl: ;  insulin glargine (LANTUS SOLOSTAR) 100 UNIT/ML injection, Inject 30 Units into the skin at bedtime., Disp: 3 mL, Rfl: 5;  Lancet Devices (LITE TOUCH LANCING DEVICE) MISC, , Disp: , Rfl:  lisinopril (PRINIVIL,ZESTRIL) 40 MG tablet, Take 1 tablet (40 mg total) by mouth daily., Disp: 30 tablet, Rfl: 11;  LITETOUCH LANCETS MISC, , Disp: , Rfl: ;  lovastatin (MEVACOR) 40 MG tablet, Take 1 tablet (40 mg total) by mouth at bedtime., Disp: 30 tablet, Rfl: 11;  metoprolol tartrate (LOPRESSOR) 25 MG tablet, Take 1 tablet (25 mg total) by mouth daily., Disp: 30 tablet, Rfl: 11 oxyCODONE (OXY IR/ROXICODONE) 5 MG immediate release tablet, Take 1 tablet (5 mg total) by mouth every 4 (four) hours as needed., Disp: 30 tablet, Rfl: 0;  pioglitazone-metformin (ACTOPLUS MET) 15-850 MG per tablet, Take 1 tablet by mouth 2 (two) times daily with a meal., Disp: 60 tablet, Rfl: 5;  traMADol (ULTRAM) 50 MG tablet, Take 50 mg by mouth every 6 (six) hours as needed., Disp: , Rfl:   BP 134/67  Pulse 74  Resp 18  Ht 5\' 5"  (1.651 m)  Wt 182 lb 8 oz (82.781  kg)  BMI 30.37 kg/m2  Body mass index is 30.37 kg/(m^2).       Physical exam is unchanged. He does have induration and mild erythema of his pre-pubic area. Does have some drainage from the incision in the right groin and the central healing of the left groin. There is also some induration or rest pre-pubic area. Otherwise chest is clear Heart regular rate and rhythm Abdomen obese nontender and benign  His CT scan reveals no evidence of involvement with the femoral arteries bilaterally. There is a superficial fluid collection approximately 4-5 cm in size in the right groin and also a area of collection in the pre-pubic area as well.  Impression and plan persistent fluid collections after  resection of a very large false aneurysm extending from both groins across his pre-pubic area. I have recommended operative debridement of this and probableVAC placement at the time of surgery. Patient understands the a several day hospitalization. We have scheduled surgery for 09/23/2012

## 2012-09-22 ENCOUNTER — Encounter (HOSPITAL_COMMUNITY): Payer: Self-pay | Admitting: Pharmacy Technician

## 2012-09-22 ENCOUNTER — Encounter (HOSPITAL_COMMUNITY): Payer: Self-pay

## 2012-09-22 MED ORDER — DEXTROSE 5 % IV SOLN
1.5000 g | INTRAVENOUS | Status: DC
  Filled 2012-09-22: qty 1.5

## 2012-09-22 NOTE — Progress Notes (Signed)
Contacted patient regarding new surgery time of 9:25am.  States was contacted by office and told to arrive at 7:30am.

## 2012-09-23 ENCOUNTER — Ambulatory Visit (HOSPITAL_COMMUNITY)
Admission: RE | Admit: 2012-09-23 | Discharge: 2012-09-23 | Disposition: A | Discharge status: Home / Self Care | Payer: Medicare PPO | Source: Ambulatory Visit | Attending: Vascular Surgery | Admitting: Vascular Surgery

## 2012-09-23 ENCOUNTER — Ambulatory Visit (HOSPITAL_COMMUNITY): Payer: Medicare PPO

## 2012-09-23 ENCOUNTER — Ambulatory Visit (HOSPITAL_COMMUNITY): Payer: Medicare PPO | Admitting: Certified Registered Nurse Anesthetist

## 2012-09-23 ENCOUNTER — Other Ambulatory Visit: Payer: Self-pay | Admitting: Thoracic Diseases

## 2012-09-23 ENCOUNTER — Encounter (HOSPITAL_COMMUNITY): Payer: Self-pay | Admitting: Certified Registered Nurse Anesthetist

## 2012-09-23 ENCOUNTER — Telehealth: Payer: Self-pay | Admitting: Vascular Surgery

## 2012-09-23 ENCOUNTER — Encounter (HOSPITAL_COMMUNITY)
Admission: RE | Disposition: A | Discharge status: Home / Self Care | Payer: Self-pay | Source: Ambulatory Visit | Attending: Vascular Surgery

## 2012-09-23 ENCOUNTER — Encounter (HOSPITAL_COMMUNITY): Payer: Self-pay

## 2012-09-23 DIAGNOSIS — Z87891 Personal history of nicotine dependence: Secondary | ICD-10-CM | POA: Insufficient documentation

## 2012-09-23 DIAGNOSIS — E119 Type 2 diabetes mellitus without complications: Secondary | ICD-10-CM | POA: Insufficient documentation

## 2012-09-23 DIAGNOSIS — T148XXA Other injury of unspecified body region, initial encounter: Secondary | ICD-10-CM

## 2012-09-23 DIAGNOSIS — M129 Arthropathy, unspecified: Secondary | ICD-10-CM | POA: Insufficient documentation

## 2012-09-23 DIAGNOSIS — N400 Enlarged prostate without lower urinary tract symptoms: Secondary | ICD-10-CM | POA: Insufficient documentation

## 2012-09-23 DIAGNOSIS — I898 Other specified noninfective disorders of lymphatic vessels and lymph nodes: Secondary | ICD-10-CM | POA: Insufficient documentation

## 2012-09-23 DIAGNOSIS — I10 Essential (primary) hypertension: Secondary | ICD-10-CM | POA: Insufficient documentation

## 2012-09-23 DIAGNOSIS — IMO0002 Reserved for concepts with insufficient information to code with codable children: Secondary | ICD-10-CM

## 2012-09-23 DIAGNOSIS — Z8041 Family history of malignant neoplasm of ovary: Secondary | ICD-10-CM | POA: Insufficient documentation

## 2012-09-23 DIAGNOSIS — E785 Hyperlipidemia, unspecified: Secondary | ICD-10-CM | POA: Insufficient documentation

## 2012-09-23 DIAGNOSIS — Z833 Family history of diabetes mellitus: Secondary | ICD-10-CM | POA: Insufficient documentation

## 2012-09-23 HISTORY — PX: I&D EXTREMITY: SHX5045

## 2012-09-23 HISTORY — DX: Myoneural disorder, unspecified: G70.9

## 2012-09-23 LAB — SURGICAL PCR SCREEN
MRSA, PCR: NEGATIVE
Staphylococcus aureus: NEGATIVE

## 2012-09-23 LAB — BASIC METABOLIC PANEL
Chloride: 102 mEq/L (ref 96–112)
GFR calc Af Amer: >90 mL/min (ref >90)
GFR calc non Af Amer: 83 mL/min — ABNORMAL LOW (ref >90)
Potassium: 4 mEq/L (ref 3.5–5.1)
Sodium: 139 mEq/L (ref 135–145)

## 2012-09-23 LAB — GLUCOSE, CAPILLARY: Glucose-Capillary: 125 mg/dL — ABNORMAL HIGH (ref 70–99)

## 2012-09-23 LAB — CBC
HCT: 26.3 % — ABNORMAL LOW (ref 39.0–52.0)
Platelets: 422 10*3/uL — ABNORMAL HIGH (ref 150–400)
RDW: 17.9 % — ABNORMAL HIGH (ref 11.5–15.5)
WBC: 5.8 10*3/uL (ref 4.0–10.5)

## 2012-09-23 SURGERY — IRRIGATION AND DEBRIDEMENT EXTREMITY
Anesthesia: General | Site: Groin | Laterality: Right | Wound class: Contaminated

## 2012-09-23 MED ORDER — METOPROLOL TARTRATE 12.5 MG HALF TABLET
25.0000 mg | ORAL_TABLET | Freq: Once | ORAL | Status: AC
  Administered 2012-09-23: 25 mg via ORAL

## 2012-09-23 MED ORDER — PROPOFOL 10 MG/ML IV BOLUS
INTRAVENOUS | Status: DC | PRN
  Administered 2012-09-23: 80 mg via INTRAVENOUS
  Administered 2012-09-23: 50 mg via INTRAVENOUS
  Administered 2012-09-23: 70 mg via INTRAVENOUS

## 2012-09-23 MED ORDER — NALOXONE HCL 0.4 MG/ML IJ SOLN
INTRAMUSCULAR | Status: AC
  Filled 2012-09-23: qty 1

## 2012-09-23 MED ORDER — FENTANYL CITRATE 0.05 MG/ML IJ SOLN
INTRAMUSCULAR | Status: DC | PRN
  Administered 2012-09-23: 150 ug via INTRAVENOUS

## 2012-09-23 MED ORDER — METOPROLOL TARTRATE 12.5 MG HALF TABLET
ORAL_TABLET | ORAL | Status: AC
  Administered 2012-09-23: 25 mg via ORAL
  Filled 2012-09-23: qty 2

## 2012-09-23 MED ORDER — HYDROMORPHONE HCL PF 1 MG/ML IJ SOLN
INTRAMUSCULAR | Status: AC
  Filled 2012-09-23: qty 1

## 2012-09-23 MED ORDER — 0.9 % SODIUM CHLORIDE (POUR BTL) OPTIME
TOPICAL | Status: DC | PRN
  Administered 2012-09-23: 1000 mL

## 2012-09-23 MED ORDER — MEPERIDINE HCL 25 MG/ML IJ SOLN: 6.2500 mg | INTRAMUSCULAR | Status: DC | PRN

## 2012-09-23 MED ORDER — MUPIROCIN 2 % EX OINT
TOPICAL_OINTMENT | Freq: Two times a day (BID) | CUTANEOUS | Status: DC
  Administered 2012-09-23: 1 via NASAL
  Filled 2012-09-23 (×2): qty 22

## 2012-09-23 MED ORDER — LACTATED RINGERS IV SOLN
INTRAVENOUS | Status: DC | PRN
  Administered 2012-09-23 (×2): via INTRAVENOUS

## 2012-09-23 MED ORDER — PROMETHAZINE HCL 25 MG/ML IJ SOLN: 6.2500 mg | INTRAMUSCULAR | Status: DC | PRN

## 2012-09-23 MED ORDER — EPHEDRINE SULFATE 50 MG/ML IJ SOLN
INTRAMUSCULAR | Status: DC | PRN
  Administered 2012-09-23: 10 mg via INTRAVENOUS

## 2012-09-23 MED ORDER — HYDROMORPHONE HCL PF 1 MG/ML IJ SOLN
0.2500 mg | INTRAMUSCULAR | Status: DC | PRN
  Administered 2012-09-23: 0.5 mg via INTRAVENOUS

## 2012-09-23 MED ORDER — SODIUM CHLORIDE 0.9 % IV SOLN: INTRAVENOUS | Status: DC

## 2012-09-23 MED ORDER — OXYCODONE-ACETAMINOPHEN 5-325 MG PO TABS: 1.0000 | ORAL_TABLET | ORAL | Status: DC | PRN

## 2012-09-23 MED ORDER — MIDAZOLAM HCL 2 MG/2ML IJ SOLN: 0.5000 mg | Freq: Once | INTRAMUSCULAR | Status: DC | PRN

## 2012-09-23 MED ORDER — LIDOCAINE HCL (CARDIAC) 20 MG/ML IV SOLN
INTRAVENOUS | Status: DC | PRN
  Administered 2012-09-23: 20 mg via INTRAVENOUS

## 2012-09-23 MED ORDER — OXYCODONE HCL 5 MG/5ML PO SOLN: 5.0000 mg | Freq: Once | ORAL | Status: DC | PRN

## 2012-09-23 MED ORDER — ONDANSETRON HCL 4 MG/2ML IJ SOLN
INTRAMUSCULAR | Status: DC | PRN
  Administered 2012-09-23: 4 mg via INTRAVENOUS

## 2012-09-23 MED ORDER — OXYCODONE HCL 5 MG PO TABS: 5.0000 mg | ORAL_TABLET | Freq: Once | ORAL | Status: DC | PRN

## 2012-09-23 SURGICAL SUPPLY — 37 items
BANDAGE ELASTIC 4 VELCRO ST LF (GAUZE/BANDAGES/DRESSINGS) IMPLANT
BANDAGE ELASTIC 6 VELCRO ST LF (GAUZE/BANDAGES/DRESSINGS) IMPLANT
BANDAGE GAUZE ELAST BULKY 4 IN (GAUZE/BANDAGES/DRESSINGS) IMPLANT
CANISTER SUCTION 2500CC (MISCELLANEOUS) ×2 IMPLANT
CLIP LIGATING EXTRA MED SLVR (CLIP) ×2 IMPLANT
CLIP LIGATING EXTRA SM BLUE (MISCELLANEOUS) ×2 IMPLANT
CLOTH BEACON ORANGE TIMEOUT ST (SAFETY) ×2 IMPLANT
COVER PROBE W GEL 5X96 (DRAPES) ×2 IMPLANT
COVER SURGICAL LIGHT HANDLE (MISCELLANEOUS) ×2 IMPLANT
DRAPE U-SHAPE 47X51 STRL (DRAPES) IMPLANT
DRAPE U-SHAPE 76X120 STRL (DRAPES) IMPLANT
DRSG PAD ABDOMINAL 8X10 ST (GAUZE/BANDAGES/DRESSINGS) ×2 IMPLANT
ELECT REM PT RETURN 9FT ADLT (ELECTROSURGICAL) ×2
ELECTRODE REM PT RTRN 9FT ADLT (ELECTROSURGICAL) ×1 IMPLANT
GLOVE ECLIPSE 7.5 STRL STRAW (GLOVE) ×2 IMPLANT
GLOVE SS BIOGEL STRL SZ 7.5 (GLOVE) ×1 IMPLANT
GLOVE SUPERSENSE BIOGEL SZ 7.5 (GLOVE) ×1
GOWN BRE IMP SLV AUR XL STRL (GOWN DISPOSABLE) ×2 IMPLANT
GOWN STRL NON-REIN LRG LVL3 (GOWN DISPOSABLE) ×2 IMPLANT
KIT BASIN OR (CUSTOM PROCEDURE TRAY) ×2 IMPLANT
KIT ROOM TURNOVER OR (KITS) ×2 IMPLANT
NS IRRIG 1000ML POUR BTL (IV SOLUTION) ×2 IMPLANT
PACK GENERAL/GYN (CUSTOM PROCEDURE TRAY) ×2 IMPLANT
PACK UNIVERSAL I (CUSTOM PROCEDURE TRAY) ×2 IMPLANT
PAD ARMBOARD 7.5X6 YLW CONV (MISCELLANEOUS) ×4 IMPLANT
SPONGE GAUZE 4X4 12PLY (GAUZE/BANDAGES/DRESSINGS) ×2 IMPLANT
STAPLER VISISTAT 35W (STAPLE) IMPLANT
SUT ETHILON 3 0 PS 1 (SUTURE) IMPLANT
SUT VIC AB 2-0 CTX 36 (SUTURE) IMPLANT
SUT VIC AB 3-0 SH 27 (SUTURE)
SUT VIC AB 3-0 SH 27X BRD (SUTURE) IMPLANT
SWAB COLLECTION DEVICE MRSA (MISCELLANEOUS) ×4 IMPLANT
TAPE CLOTH SURG 4X10 WHT LF (GAUZE/BANDAGES/DRESSINGS) ×2 IMPLANT
TOWEL OR 17X24 6PK STRL BLUE (TOWEL DISPOSABLE) ×2 IMPLANT
TOWEL OR 17X26 10 PK STRL BLUE (TOWEL DISPOSABLE) ×4 IMPLANT
TUBE ANAEROBIC SPECIMEN COL (MISCELLANEOUS) ×4 IMPLANT
WATER STERILE IRR 1000ML POUR (IV SOLUTION) ×2 IMPLANT

## 2012-09-23 NOTE — Interval H&P Note (Signed)
History and Physical Interval Note:  09/23/2012 9:52 AM  Joe Barry  has presented today for surgery, with the diagnosis of Non-healing right groin wound  The various methods of treatment have been discussed with the patient and family. After consideration of risks, benefits and other options for treatment, the patient has consented to  Procedure(s) (LRB) with comments: IRRIGATION AND DEBRIDEMENT EXTREMITY (Right) - Irrigation and Debridement prepubic and right groin wound;  Vac placement  as a surgical intervention .  The patient's history has been reviewed, patient examined, no change in status, stable for surgery.  I have reviewed the patient's chart and labs.  Questions were answered to the patient's satisfaction.     Stormie Ventola

## 2012-09-23 NOTE — Op Note (Signed)
OPERATIVE REPORT  DATE OF SURGERY: 09/23/2012  PATIENT: Joe Barry, 79 y.o. male MRN: [id number]  DOB: [birth year]  PRE-OPERATIVE DIAGNOSIS: Persistent fluid collection possible infection right groin and right pubic suprapubic area  POST-OPERATIVE DIAGNOSIS:  Same  PROCEDURE: I&D of probable lymphocele right groin and I&D of fluid collection with retained Dacron graft in the suprapubic area  SURGEON:  Gretta Began, M.D.  ANESTHESIA:  Gen.  EBL: Minimal ml  Total I/O In: 1300 [I.V.:1300] Out: -   BLOOD ADMINISTERED: None  DRAINS: None  SPECIMEN: Culture of right groin and culture of suprapubic fluid collection  COUNTS CORRECT:  YES  PLAN OF CARE: PACU   PATIENT DISPOSITION:  PACU - hemodynamically stable  PROCEDURE DETAILS: Patient is status post repair of a huge suprapubic and bilateral false aneurysm related to a prior fem-fem bypass and had been occluded for many years. This was approximately 3-4 months ago. He has had persistent fluid collection in the right groin and suprapubic area. CT showed these fluid collections as well. He is taken at this time for I&D of these areas  The patient was taken up replacing much of area of both groins prepped in the sterile fashion. Incision was made over the area on the right groin to the prior incision. This was a clear fluid with a organized lymphocele. The lymphocele material was completely removed. A hemostasis electrocautery and this wound was packed with saline soaked 4 x 4. Next attention was turned to the suprapubic area. Ultrasound was used to identify the area the fluid collection. There was a area in the midline that had opened up and was had slight drainage. This was opened up more was a 10 blade to approximately 3-4 cm. The fluid collection was entered in this area was debrided. There was a segment of old nonfunctional Dacron graft base of this. It was irrigated with saline hemostasis was obtained with electrocautery. Both the  groin and the suprapubic area were cultured with aerobic and anaerobic cultures. The suprapubic area was also packed with saline soaked 4 x 4. The patient was transferred to the recovery room in stable condition. He will be discharged today with plan for back placement and 48-72 hours.   Gretta Began, M.D. 09/23/2012 3:07 PM

## 2012-09-23 NOTE — Preoperative (Signed)
Beta Blockers   Reason not to administer Beta Blockers:Not Applicable, Pt took Metoprolol this am 

## 2012-09-23 NOTE — Progress Notes (Signed)
Pt S/P I&D of superpubic wound and right groin wound

## 2012-09-23 NOTE — Anesthesia Preprocedure Evaluation (Addendum)
Anesthesia Evaluation  Patient identified by MRN, date of birth, ID band Patient awake    Reviewed: Allergy & Precautions, H&P , NPO status , Patient's Chart, lab work & pertinent test results  History of Anesthesia Complications Negative for: history of anesthetic complications  Airway Mallampati: II TM Distance: >3 FB Neck ROM: Full    Dental  (+) Dental Advisory Given, Edentulous Upper and Edentulous Lower   Pulmonary former smoker,  breath sounds clear to auscultation  Pulmonary exam normal       Cardiovascular hypertension, Pt. on medications and Pt. on home beta blockers Rhythm:Regular Rate:Normal     Neuro/Psych    GI/Hepatic   Endo/Other  diabetes (glu 151), Type 2, Insulin Dependent  Renal/GU      Musculoskeletal  (+) Arthritis -, Osteoarthritis,    Abdominal   Peds  Hematology  (+) JEHOVAH'S WITNESS  Anesthesia Other Findings PVD  Reproductive/Obstetrics                         Anesthesia Physical Anesthesia Plan  ASA: III  Anesthesia Plan: General   Post-op Pain Management:    Induction: Intravenous  Airway Management Planned: LMA  Additional Equipment:   Intra-op Plan:   Post-operative Plan:   Informed Consent: I have reviewed the patients History and Physical, chart, labs and discussed the procedure including the risks, benefits and alternatives for the proposed anesthesia with the patient or authorized representative who has indicated his/her understanding and acceptance.   Dental advisory given  Plan Discussed with: CRNA and Surgeon  Anesthesia Plan Comments: (Plan routine monitors, GA- LMA OK)       Anesthesia Quick Evaluation

## 2012-09-23 NOTE — Telephone Encounter (Addendum)
Message copied by Shari Prows on Fri Sep 23, 2012  1:13 PM ------      Message from: Melene Plan      Created: Fri Sep 23, 2012 11:31 AM                   ----- Message -----         From: Marlowe Shores, PA         Sent: 09/23/2012  11:23 AM           To: Melene Plan, RN            2 week F/U I&D wounds - Early, wound vac  I scheduled an appt for the above pt on 10/04/12 at 8:45am w/ [facility]. I mailed an appt letter and also left a voicemail message for the pt/awt

## 2012-09-23 NOTE — Anesthesia Postprocedure Evaluation (Signed)
  Anesthesia Post-op Note  Patient: Joe Barry  Procedure(s) Performed: Procedure(s) (LRB) with comments: IRRIGATION AND DEBRIDEMENT EXTREMITY (Right) - Irrigation and Debridement prepubic and right groin wound  Patient Location: PACU  Anesthesia Type:General  Level of Consciousness: awake, alert , oriented and patient cooperative  Airway and Oxygen Therapy: Patient Spontanous Breathing and Patient connected to nasal cannula oxygen  Post-op Pain: mild  Post-op Assessment: Post-op Vital signs reviewed, Patient's Cardiovascular Status Stable, Respiratory Function Stable, Patent Airway, No signs of Nausea or vomiting and Pain level controlled  Post-op Vital Signs: Reviewed and stable  Complications: No apparent anesthesia complications

## 2012-09-23 NOTE — H&P (View-Only) (Signed)
The patient presents today for continued followup of repair of resection of a very large false aneurysm that extended from his left groin to his right groin. Surgery was on 07/16/2012. He had an old nonfunctional fem-fem graft for many years ago. He presented with a very large area over his entire pre-pubic area and both groins with CT showing flow into large false aneurysms bilaterally. He did well after resection of this and replacement with a small Dacron patch over both groins. He did have a drain in his left groin that eventually was removed. Subsequently he has had complete resolution of any drainage from his left groin is now had drainage from his right groin was some fluctuance and also some breakdown over inflamed pre-pubic area. Last week when I saw him and recommended a CT for further evaluation and he is here today for discussion of this.  Past Medical History  Diagnosis Date  . Hypertension   . Arthritis   . BPH (benign prostatic hypertrophy)   . Cataract   . Dyslipidemia   . No blood products   . Refusal of blood transfusions as patient is Jehovah's Witness   . Diabetes mellitus     History  Substance Use Topics  . Smoking status: Former Smoker    Types: Cigarettes    Quit date: 07/26/1972  . Smokeless tobacco: Never Used  . Alcohol Use: Yes     Comment: occasional    Family History  Problem Relation Age of Onset  . Cancer Mother     ovarian  . Diabetes Father     No Known Allergies  Current outpatient prescriptions:Blood Glucose Monitoring Suppl (FORA V12 BLOOD GLUCOSE SYSTEM) DEVI, , Disp: , Rfl: ;  brimonidine (ALPHAGAN) 0.2 % ophthalmic solution, Place 1 drop into the right eye 2 (two) times daily., Disp: 5 mL, Rfl: ;  cephALEXin (KEFLEX) 500 MG capsule, , Disp: , Rfl: ;  colchicine 0.6 MG tablet, Take 0.6 mg by mouth daily as needed. For gout, Disp: , Rfl: ;  FORA V12 BLOOD GLUCOSE TEST test strip, , Disp: , Rfl:  hydrochlorothiazide (MICROZIDE) 12.5 MG capsule,  Take 1 capsule (12.5 mg total) by mouth daily., Disp: 30 capsule, Rfl: 11;  ibuprofen (ADVIL,MOTRIN) 200 MG tablet, Take 200 mg by mouth every 6 (six) hours as needed., Disp: , Rfl: ;  insulin glargine (LANTUS SOLOSTAR) 100 UNIT/ML injection, Inject 30 Units into the skin at bedtime., Disp: 3 mL, Rfl: 5;  Lancet Devices (LITE TOUCH LANCING DEVICE) MISC, , Disp: , Rfl:  lisinopril (PRINIVIL,ZESTRIL) 40 MG tablet, Take 1 tablet (40 mg total) by mouth daily., Disp: 30 tablet, Rfl: 11;  LITETOUCH LANCETS MISC, , Disp: , Rfl: ;  lovastatin (MEVACOR) 40 MG tablet, Take 1 tablet (40 mg total) by mouth at bedtime., Disp: 30 tablet, Rfl: 11;  metoprolol tartrate (LOPRESSOR) 25 MG tablet, Take 1 tablet (25 mg total) by mouth daily., Disp: 30 tablet, Rfl: 11 oxyCODONE (OXY IR/ROXICODONE) 5 MG immediate release tablet, Take 1 tablet (5 mg total) by mouth every 4 (four) hours as needed., Disp: 30 tablet, Rfl: 0;  pioglitazone-metformin (ACTOPLUS MET) 15-850 MG per tablet, Take 1 tablet by mouth 2 (two) times daily with a meal., Disp: 60 tablet, Rfl: 5;  traMADol (ULTRAM) 50 MG tablet, Take 50 mg by mouth every 6 (six) hours as needed., Disp: , Rfl:   BP 134/67  Pulse 74  Resp 18  Ht 5' 5" (1.651 m)  Wt 182 lb 8 oz (82.781   kg)  BMI 30.37 kg/m2  Body mass index is 30.37 kg/(m^2).       Physical exam is unchanged. He does have induration and mild erythema of his pre-pubic area. Does have some drainage from the incision in the right groin and the central healing of the left groin. There is also some induration or rest pre-pubic area. Otherwise chest is clear Heart regular rate and rhythm Abdomen obese nontender and benign  His CT scan reveals no evidence of involvement with the femoral arteries bilaterally. There is a superficial fluid collection approximately 4-5 cm in size in the right groin and also a area of collection in the pre-pubic area as well.  Impression and plan persistent fluid collections after  resection of a very large false aneurysm extending from both groins across his pre-pubic area. I have recommended operative debridement of this and probableVAC placement at the time of surgery. Patient understands the a several day hospitalization. We have scheduled surgery for 09/23/2012 

## 2012-09-23 NOTE — Transfer of Care (Signed)
Immediate Anesthesia Transfer of Care Note  Patient: Joe Barry  Procedure(s) Performed: Procedure(s) (LRB) with comments: IRRIGATION AND DEBRIDEMENT EXTREMITY (Right) - Irrigation and Debridement prepubic and right groin wound  Patient Location: PACU  Anesthesia Type:General  Level of Consciousness: awake, alert , oriented and patient cooperative  Airway & Oxygen Therapy: Patient Spontanous Breathing and Patient connected to nasal cannula oxygen  Post-op Assessment: Report given to PACU RN, Post -op Vital signs reviewed and stable and Patient moving all extremities X 4  Post vital signs: Reviewed and stable  Complications: No apparent anesthesia complications

## 2012-09-23 NOTE — Anesthesia Procedure Notes (Addendum)
Procedure Name: LMA Insertion and Intubation Date/Time: 09/23/2012 10:45 AM Performed by: Rogelia Boga Pre-anesthesia Checklist: Emergency Drugs available, Patient identified, Suction available, Patient being monitored and Timeout performed Patient Re-evaluated:Patient Re-evaluated prior to inductionOxygen Delivery Method: Circle system utilized Preoxygenation: Pre-oxygenation with 100% oxygen Intubation Type: IV induction Ventilation: Mask ventilation without difficulty Tube size: 7.5 mm Airway Equipment and Method: Video-laryngoscopy Placement Confirmation: ETT inserted through vocal cords under direct vision,  positive ETCO2 and breath sounds checked- equal and bilateral Secured at: 23 cm Tube secured with: Tape Dental Injury: Teeth and Oropharynx as per pre-operative assessment  Difficulty Due To: Difficulty was unanticipated, Difficult Airway- due to immobile epiglottis and Difficult Airway- due to anterior larynx Future Recommendations: Recommend- induction with short-acting agent, and alternative techniques readily available Comments: Present for induction, easy mask, LMA will not seal well, DL x2, epiglottis only, which is immobile,  very anterior view with VideoGlide, BBS=, +ETCO2 after atraum intubation   Sandford Craze, MD

## 2012-09-25 LAB — WOUND CULTURE

## 2012-09-26 ENCOUNTER — Encounter (HOSPITAL_COMMUNITY): Payer: Self-pay | Admitting: Vascular Surgery

## 2012-09-28 LAB — ANAEROBIC CULTURE

## 2012-10-03 ENCOUNTER — Encounter: Payer: Self-pay | Admitting: Vascular Surgery

## 2012-10-04 ENCOUNTER — Other Ambulatory Visit: Payer: Self-pay | Admitting: *Deleted

## 2012-10-04 ENCOUNTER — Encounter: Payer: Self-pay | Admitting: Vascular Surgery

## 2012-10-04 ENCOUNTER — Ambulatory Visit (INDEPENDENT_AMBULATORY_CARE_PROVIDER_SITE_OTHER): Payer: Medicare PPO | Admitting: Vascular Surgery

## 2012-10-04 VITALS — BP 147/65 | HR 68 | Resp 20 | Ht 65.0 in | Wt 182.0 lb

## 2012-10-04 DIAGNOSIS — I729 Aneurysm of unspecified site: Secondary | ICD-10-CM

## 2012-10-04 DIAGNOSIS — T8189XA Other complications of procedures, not elsewhere classified, initial encounter: Secondary | ICD-10-CM | POA: Insufficient documentation

## 2012-10-04 DIAGNOSIS — G8918 Other acute postprocedural pain: Secondary | ICD-10-CM

## 2012-10-04 MED ORDER — OXYCODONE-ACETAMINOPHEN 5-325 MG PO TABS: 1.0000 | ORAL_TABLET | ORAL | Status: DC | PRN

## 2012-10-04 NOTE — Progress Notes (Signed)
The patient presents today for followup of his I&D of his right groin and suprapubic wound. The procedure was on 09/23/2012. He initially presented with a huge false aneurysm encompassing his entire suprapubic area. The surgery was on 07/16/2012. At the I&D he was found to have a lymphocele in his right groin and some retained old Dacron in the suprapubic area. He had a VAC dressing placed.  1 today is outstanding with a markedly diminished size of the 2 areas and excellent granulation tissue with no evidence of surrounding erythema.  He will continue the [facility] dressing and I will see him again for continued followup in 2 weeks

## 2012-10-06 ENCOUNTER — Telehealth: Payer: Self-pay | Admitting: *Deleted

## 2012-10-06 NOTE — Telephone Encounter (Signed)
Called [place] to inquire the status of wound vac. Patient's daughter had called the office stating vac had not been reapplied after seeing Dr Arbie Cookey 10/04/2012. Dr Arbie Cookey had removed vac and I packed both wounds with normal saline wet/dry as per his instructions until home health would reapply.  I called [place] to inform her of dressing applied by me and and  [facility] need to continue vac 10/05/12.  Dr Early assured daughter that wet to dry was fine until then.  Tamela Oddi replied that she would contact the nurse manager to see what the delay was.

## 2012-10-17 ENCOUNTER — Telehealth: Payer: Self-pay | Admitting: *Deleted

## 2012-10-17 ENCOUNTER — Encounter: Payer: Self-pay | Admitting: Vascular Surgery

## 2012-10-17 NOTE — Telephone Encounter (Signed)
Darl Pikes, nurse from [facility] called asking for suggestions concerning wound vac and patients healing wounds. She states that wound is so small that she can not place the black foam and get an even suction. Joe Barry has an appointment tomorrow, 10/18/12 with Dr Arbie Cookey and I explained he would evaluate at that time and would have new orders.

## 2012-10-18 ENCOUNTER — Ambulatory Visit (INDEPENDENT_AMBULATORY_CARE_PROVIDER_SITE_OTHER): Payer: Medicare PPO | Admitting: Vascular Surgery

## 2012-10-18 ENCOUNTER — Encounter: Payer: Self-pay | Admitting: Vascular Surgery

## 2012-10-18 VITALS — BP 161/71 | HR 73 | Resp 20 | Ht 65.0 in | Wt 182.0 lb

## 2012-10-18 DIAGNOSIS — I739 Peripheral vascular disease, unspecified: Secondary | ICD-10-CM

## 2012-10-18 NOTE — Progress Notes (Signed)
Patient has today for followup of drainage of lymphocele his right groin an I&D of an area over his pre-pubic area. This surgery was on 09/23/2012 need to have back dressing since that time.  He is quite good today. Both groin incisions are completely healed as is the pre-pubic area. He will discontinue dressings. I discussed this with the patient and his daughter present. We will continue to see him on an as-needed basis. He does have Dacron patch angioplasty for closure both common femoral arteries where he had chronic may huge false aneurysm. He will notify should he develop any ischemic symptoms or wound problems

## 2012-10-21 ENCOUNTER — Other Ambulatory Visit: Payer: Medicare Other

## 2012-11-10 ENCOUNTER — Ambulatory Visit (INDEPENDENT_AMBULATORY_CARE_PROVIDER_SITE_OTHER): Payer: Medicare PPO | Admitting: Ophthalmology

## 2012-11-10 DIAGNOSIS — E1165 Type 2 diabetes mellitus with hyperglycemia: Secondary | ICD-10-CM

## 2012-11-10 DIAGNOSIS — H43819 Vitreous degeneration, unspecified eye: Secondary | ICD-10-CM

## 2012-11-10 DIAGNOSIS — I10 Essential (primary) hypertension: Secondary | ICD-10-CM

## 2012-11-10 DIAGNOSIS — H35349 Macular cyst, hole, or pseudohole, unspecified eye: Secondary | ICD-10-CM

## 2012-11-10 DIAGNOSIS — H4010X0 Unspecified open-angle glaucoma, stage unspecified: Secondary | ICD-10-CM

## 2012-11-10 DIAGNOSIS — H35039 Hypertensive retinopathy, unspecified eye: Secondary | ICD-10-CM

## 2012-11-25 ENCOUNTER — Other Ambulatory Visit: Payer: Self-pay | Admitting: Family Medicine

## 2013-02-07 ENCOUNTER — Other Ambulatory Visit: Payer: Self-pay | Admitting: Family Medicine

## 2013-02-09 ENCOUNTER — Telehealth: Payer: Self-pay | Admitting: Family Medicine

## 2013-02-09 DIAGNOSIS — E785 Hyperlipidemia, unspecified: Secondary | ICD-10-CM

## 2013-02-09 MED ORDER — LOVASTATIN 40 MG PO TABS: 40.0000 mg | ORAL_TABLET | Freq: Every day | ORAL | Status: DC

## 2013-02-09 MED ORDER — LISINOPRIL 40 MG PO TABS: ORAL_TABLET | ORAL | Status: DC

## 2013-02-09 MED ORDER — METOPROLOL TARTRATE 25 MG PO TABS: ORAL_TABLET | ORAL | Status: DC

## 2013-02-09 MED ORDER — PIOGLITAZONE HCL-METFORMIN HCL 15-850 MG PO TABS: ORAL_TABLET | ORAL | Status: DC

## 2013-02-09 NOTE — Telephone Encounter (Signed)
SENT MEDS IN  

## 2013-02-10 ENCOUNTER — Ambulatory Visit (INDEPENDENT_AMBULATORY_CARE_PROVIDER_SITE_OTHER): Payer: Medicare PPO | Admitting: Family Medicine

## 2013-02-10 ENCOUNTER — Encounter: Payer: Self-pay | Admitting: Family Medicine

## 2013-02-10 VITALS — BP 138/80 | HR 57 | Ht 62.0 in | Wt 188.0 lb

## 2013-02-10 DIAGNOSIS — I10 Essential (primary) hypertension: Secondary | ICD-10-CM

## 2013-02-10 DIAGNOSIS — E1169 Type 2 diabetes mellitus with other specified complication: Secondary | ICD-10-CM

## 2013-02-10 DIAGNOSIS — E119 Type 2 diabetes mellitus without complications: Secondary | ICD-10-CM

## 2013-02-10 DIAGNOSIS — Z0000 Encounter for general adult medical examination without abnormal findings: Secondary | ICD-10-CM

## 2013-02-10 DIAGNOSIS — I739 Peripheral vascular disease, unspecified: Secondary | ICD-10-CM

## 2013-02-10 DIAGNOSIS — B351 Tinea unguium: Secondary | ICD-10-CM

## 2013-02-10 DIAGNOSIS — E785 Hyperlipidemia, unspecified: Secondary | ICD-10-CM

## 2013-02-10 DIAGNOSIS — M549 Dorsalgia, unspecified: Secondary | ICD-10-CM

## 2013-02-10 DIAGNOSIS — E1159 Type 2 diabetes mellitus with other circulatory complications: Secondary | ICD-10-CM

## 2013-02-10 LAB — POCT GLYCOSYLATED HEMOGLOBIN (HGB A1C): Hemoglobin A1C: 9.2

## 2013-02-10 LAB — LIPID PANEL: Cholesterol: 156 mg/dL (ref 0–200)

## 2013-02-10 MED ORDER — TRAMADOL HCL 50 MG PO TABS: 50.0000 mg | ORAL_TABLET | Freq: Three times a day (TID) | ORAL | Status: DC | PRN

## 2013-02-10 NOTE — Patient Instructions (Signed)
Increase your insulin by 2 units every 2 days until your blood sugar in the morning he is under 120. Check your blood sugars 2 hours after some of your meals you get immediate feedback on that food.

## 2013-02-10 NOTE — Progress Notes (Signed)
Subjective:    Joe Barry is a 79 y.o. male who presents for follow-up of Type 2 diabetes mellitus.    Home blood sugar records: 170'S 1 TIME A DAY  Current symptoms/problems NONE Daily foot checks, foot concerns: YES Last eye exam:  3/14   Medication compliance:is fair Current diet: NO.he eats anything he wants Current exercise: WORK IN GARDEN Known diabetic complications: cardiovascular disease Cardiovascular risk factors: advanced age (older than 62 for men, 60 for women), diabetes mellitus, dyslipidemia, hypertension, male gender, obesity (BMI >= 30 kg/m2) and sedentary lifestyle He has had a great deal of difficulty over the last year with a false aneurysm with repair and infection. This seems to be behind him at this point. He pretty much eats and does whatever he wants. He recognizes his blood sugars been elevated but has chosen not to do anything about it.  The following portions of the patient's history were reviewed and updated as appropriate: allergies, current medications, past family history, past medical history, past social history, past surgical history and problem list.  ROS as in subjective above    Objective:    General appearence: alert, no distress, WD/WN Neck: supple, no lymphadenopathy, no thyromegaly, no masses Heart: RRR, normal S1, S2, no murmurs Lungs: CTA bilaterally, no wheezes, rhonchi, or rales Abdomen: +bs, soft, non tender, non distended, no masses, no hepatomegaly, no splenomegaly Pulses: 2+ symmetric, upper and lower extremities, normal cap refill Ext: no edema Foot exam:  Neuro: foot monofilament exam normal   Lab Review Lab Results  Component Value Date   HGBA1C 9.5 01/21/2012   Lab Results  Component Value Date   CHOL 127 01/21/2012   HDL 63 01/21/2012   LDLCALC 54 01/21/2012   TRIG 49 01/21/2012   CHOLHDL 2.0 01/21/2012   No results found for this basenameConcepcion Barry     Chemistry      Component Value Date/Time   NA 139  09/23/2012 0757   K 4.0 09/23/2012 0757   CL 102 09/23/2012 0757   CO2 25 09/23/2012 0757   BUN 13 09/23/2012 0757   CREATININE 0.83 09/23/2012 0757   CREATININE 0.98 09/19/2012 1222      Component Value Date/Time   CALCIUM 9.3 09/23/2012 0757   ALKPHOS 80 07/15/2012 1848   AST 15 07/15/2012 1848   ALT 8 07/15/2012 1848   BILITOT 0.4 07/15/2012 1848        Chemistry      Component Value Date/Time   NA 139 09/23/2012 0757   K 4.0 09/23/2012 0757   CL 102 09/23/2012 0757   CO2 25 09/23/2012 0757   BUN 13 09/23/2012 0757   CREATININE 0.83 09/23/2012 0757   CREATININE 0.98 09/19/2012 1222      Component Value Date/Time   CALCIUM 9.3 09/23/2012 0757   ALKPHOS 80 07/15/2012 1848   AST 15 07/15/2012 1848   ALT 8 07/15/2012 1848   BILITOT 0.4 07/15/2012 1848       Last optometry/ophthalmology exam reviewed from:    Assessment:   Routine general medical examination at a health care facility  Diabetes mellitus - Plan: POCT glycosylated hemoglobin (Hb A1C), Ambulatory referral to Podiatry  Onychomycosis - Plan: Ambulatory referral to Podiatry  Hypertension associated with diabetes  Hyperlipidemia LDL goal <70 - Plan: Lipid panel  PVD (peripheral vascular disease)  Back pain - Plan: traMADol (ULTRAM) 50 MG tablet       Plan:    1.  Rx changes: He is  to increase his insulin by 2 units every 2 days until his blood sugar is under 120 2.  Education: Reviewed 'ABCs' of diabetes management (respective goals in parentheses):  A1C (<7), blood pressure (<130/80), and cholesterol (LDL <100). 3.  Compliance at present is estimated to be poor. Efforts to improve compliance (if necessary) will be directed at none as patient is unwilling to change his diet. 4. Follow up: 4 months  Advanced directive was discussed and apparently he does have one.

## 2013-02-13 NOTE — Progress Notes (Signed)
Quick Note:  CALLED PT TO INFORM HIM LIPIDS LOOK GOOD CONTINUE PRESENT MEDS. PT VERBALIZED UNDERSTANDING  ______

## 2013-02-27 ENCOUNTER — Telehealth: Payer: Self-pay | Admitting: Internal Medicine

## 2013-02-27 NOTE — Telephone Encounter (Signed)
Pt DOES NOT use [facility] for his diabetic supplies

## 2013-03-13 ENCOUNTER — Other Ambulatory Visit: Payer: Self-pay | Admitting: Family Medicine

## 2013-03-20 ENCOUNTER — Telehealth: Payer: Self-pay | Admitting: Family Medicine

## 2013-03-20 NOTE — Telephone Encounter (Signed)
YES THEY HAD AN APPOINTMENT ON June 18 WITH [facility] THEY NO SHOWED THE APPOINT WAS GIVEN TO THEM BEFORE THEY LEFT LAST OV CALLED AND GOT ANOTHER APPOINTMENT July 14 AT 9:30 AM ERICA IS AWARE

## 2013-03-21 ENCOUNTER — Encounter: Payer: Self-pay | Admitting: Medical

## 2013-03-21 ENCOUNTER — Ambulatory Visit (INDEPENDENT_AMBULATORY_CARE_PROVIDER_SITE_OTHER): Payer: Medicare PPO | Admitting: Medical

## 2013-03-21 VITALS — BP 142/82 | HR 60 | Temp 97.8°F | Resp 16 | Wt 194.0 lb

## 2013-03-21 DIAGNOSIS — K044 Acute apical periodontitis of pulpal origin: Secondary | ICD-10-CM

## 2013-03-21 DIAGNOSIS — K047 Periapical abscess without sinus: Secondary | ICD-10-CM

## 2013-03-21 MED ORDER — CEFUROXIME AXETIL 500 MG PO TABS: 500.0000 mg | ORAL_TABLET | Freq: Two times a day (BID) | ORAL | Status: DC

## 2013-03-21 MED ORDER — HYDROCODONE-ACETAMINOPHEN 5-325 MG PO TABS: 1.0000 | ORAL_TABLET | Freq: Four times a day (QID) | ORAL | Status: DC | PRN

## 2013-03-21 NOTE — Patient Instructions (Signed)
Stop ibuprofen and ultram for now.  Begin Hydrocodone 5/325mg , 1 tablet every 6 hours as needed for pain.  Caution - this may cause drowsiness and constipation.  Drink plenty of water.  Begin Ceftin antibiotic.    Make appointment with dentist ASAP.

## 2013-03-21 NOTE — Progress Notes (Signed)
Subjective:  Joe Barry is a 79 y.o. male who presents with right upper tooth pain.   Been gradually getting worse, but now to the point of pain radiating up into gum and even hurts in his nose.  Denies fever.  Hurts to bite/chew.   No prior similar.  He reports that he hasn't seen a dentist in 30 years.   No other aggravating or relieving factors.  No other c/o.  The following portions of the patient's history were reviewed and updated as appropriate: allergies, current medications, past family history, past medical history, past social history, past surgical history and problem list.  ROS Otherwise as in subjective above  Objective: Physical Exam  Vital signs reviewed  General appearence: alert, no distress, WD/WN, seems to be in some pain HEENT: normocephalic, tender over right maxilla over teeth, otherwise nontender, nares patent, no discharge or erythema, pharynx normal Oral cavity: MMM, only has few remaining upper and lower incisors and canines, otherwise edentulous.   Right incisor upper with tenderness at gum line, severe plaque buildup, and appears to have some purulent drainage suggesting abscess of same tooth Neck: supple, no lymphadenopathy, no thyromegaly, no masses   Assessment: Encounter Diagnosis  Name Primary?  Marland Kitchen Tooth infection Yes     Plan: Begin Ceftin, Hydrocodone for pain prn, discussed risks/benefits of pain medication, gave contact info for dentists in the area, and advised he f/u with dentist within the next 5-7 days.

## 2013-03-22 ENCOUNTER — Telehealth: Payer: Self-pay | Admitting: Family Medicine

## 2013-03-22 NOTE — Telephone Encounter (Signed)
Message copied by Janeice Robinson on Wed Mar 22, 2013 10:17 AM ------      Message from: Aleen Campi, DAVID S      Created: Tue Mar 21, 2013  1:38 PM       pls make sure he was able to get dental appt within 5-7 days ------

## 2013-03-22 NOTE — Telephone Encounter (Signed)
I called the patient to inquire if had called to schedule his dental appointment and he states to me that he was going to wait until the swelling goes down before he made the appointment. I explain to him that he needed to go ahead and set the appointment up now because the dental offices are not open on Fridays. Patient agreed and said he would take care of it. I informed Kristian Covey PA-C of this. He ask to call him back and ask him if it was all right if I called and scheduled the appointment for him to be seen today or tomorrow. The patient states no that he will need to wait on his daughter and they would take care of it. I said Thank you. CLS Crosby Oyster PA-C is aware.

## 2013-03-27 ENCOUNTER — Telehealth: Payer: Self-pay | Admitting: Internal Medicine

## 2013-03-27 NOTE — Telephone Encounter (Signed)
Joe Barry called stating that he went to the podiatrist to get his toe nails trimmed and the Doctor there states that he needs to go back to Dr. Arbie Cookey for something dealing with the pulses in his foot and Dr. Arbie Cookey had already signed off on him and then The podiatrist also recommended getting diabetic shoes and told Joe Barry that you could just write a Rx for him.

## 2013-03-27 NOTE — Telephone Encounter (Signed)
Let her know that I need a note from the podiatrist concerning the need for diabetic shoes.

## 2013-03-27 NOTE — Telephone Encounter (Signed)
ERICA WAS INFORMED WE NEEDED TO NOTE FROM PODIATRY FOR DIABETIC SHOES

## 2013-04-03 ENCOUNTER — Encounter (INDEPENDENT_AMBULATORY_CARE_PROVIDER_SITE_OTHER): Payer: Medicare PPO

## 2013-04-03 DIAGNOSIS — E1159 Type 2 diabetes mellitus with other circulatory complications: Secondary | ICD-10-CM

## 2013-04-03 DIAGNOSIS — I739 Peripheral vascular disease, unspecified: Secondary | ICD-10-CM

## 2013-04-19 ENCOUNTER — Other Ambulatory Visit: Payer: Self-pay

## 2013-04-19 MED ORDER — PIOGLITAZONE HCL-METFORMIN HCL 15-850 MG PO TABS: ORAL_TABLET | ORAL | Status: DC

## 2013-04-19 MED ORDER — METOPROLOL TARTRATE 25 MG PO TABS: ORAL_TABLET | ORAL | Status: DC

## 2013-04-19 MED ORDER — LISINOPRIL 40 MG PO TABS: ORAL_TABLET | ORAL | Status: DC

## 2013-04-19 NOTE — Telephone Encounter (Signed)
SENT IN MEDS 

## 2013-05-02 ENCOUNTER — Ambulatory Visit (INDEPENDENT_AMBULATORY_CARE_PROVIDER_SITE_OTHER): Payer: Medicare PPO | Admitting: Family Medicine

## 2013-05-02 DIAGNOSIS — M129 Arthropathy, unspecified: Secondary | ICD-10-CM

## 2013-05-02 DIAGNOSIS — M1990 Unspecified osteoarthritis, unspecified site: Secondary | ICD-10-CM

## 2013-05-02 MED ORDER — DICLOFENAC SODIUM 1 % TD GEL: 2.0000 g | Freq: Four times a day (QID) | TRANSDERMAL | Status: DC

## 2013-05-02 NOTE — Progress Notes (Signed)
  Subjective:    Patient ID: Joe Barry, male    DOB: 08-Feb-1934, 79 y.o.   MRN: [id number]  HPI He is here for discussion of his continued difficulty with arthritis. He is interested in a brace for his right knee. He has had a replacement of his left. He does complain of some left knee pain but mostly right-sided pain. Review of the record indicates he had an x-ray done last year which showed tricompartmental changes. He is not interested in having a knee replacement.   Review of Systems     Objective:   Physical Exam Good motion of the knee. Slight lipping is noted medially. No effusion. No crepitus.       Assessment & Plan:  Arthritis - Plan: diclofenac sodium (VOLTAREN) 1 % GEL  I discussed options with him concerning this including surgery versus injections, medications and topical. I will try him on Voltaren topical as well as having used Tylenol. If this is not beneficial I will refer him to Dr. Lajoyce Corners for possible injection but try to avoid steroid injections.

## 2013-05-02 NOTE — Patient Instructions (Signed)
Use the gel up to 4 times per day and you can also take Tylenol to help knock the edge off the pain

## 2013-06-19 ENCOUNTER — Telehealth: Payer: Self-pay | Admitting: *Deleted

## 2013-06-19 NOTE — Telephone Encounter (Signed)
Check status of diabetic shoes.

## 2013-06-20 NOTE — Telephone Encounter (Signed)
Called Sue Lush, pt's daughter, regarding pt's diabetic shoes and insoles. I informed her that his diabetic shoes were here, but still waiting on the insoles. I will contact her once the insoles arrive and a appointment would be needed at this time.

## 2013-07-05 ENCOUNTER — Ambulatory Visit (INDEPENDENT_AMBULATORY_CARE_PROVIDER_SITE_OTHER): Payer: Medicare PPO | Admitting: Podiatry

## 2013-07-05 ENCOUNTER — Other Ambulatory Visit (INDEPENDENT_AMBULATORY_CARE_PROVIDER_SITE_OTHER): Payer: Medicare PPO

## 2013-07-05 ENCOUNTER — Encounter: Payer: Self-pay | Admitting: Podiatry

## 2013-07-05 VITALS — BP 215/112 | HR 80 | Resp 20 | Ht 65.0 in | Wt 180.0 lb

## 2013-07-05 DIAGNOSIS — M2010 Hallux valgus (acquired), unspecified foot: Secondary | ICD-10-CM

## 2013-07-05 DIAGNOSIS — Z23 Encounter for immunization: Secondary | ICD-10-CM

## 2013-07-05 DIAGNOSIS — E1149 Type 2 diabetes mellitus with other diabetic neurological complication: Secondary | ICD-10-CM

## 2013-07-05 NOTE — Progress Notes (Signed)
Patient ID: Joe Barry, male   DOB: January 03, 1934, 79 y.o.   MRN: [id number]   Subjective: Patient presents for dispensing of diabetic shoes.  Objective: Diabetic shoes with 3 pairs of heat molded custom orthotics were dispensed  Assessment: satisfactory fit of diabetic shoes indication for shoes includes type 2 diabetes, diabetes with neurological manifestations, diabetes with vascular manifestations, and bunion deformity.  Plan: Dispensing of shoes with custom orthotics provided today with the written instructions provided to the patient.  Reappoint at patient's request.  Richard C.Leeanne Deed, DPM

## 2013-07-05 NOTE — Patient Instructions (Signed)
Folow printed instructions that were gunen when the shoes were dispensed.

## 2013-07-19 ENCOUNTER — Telehealth: Payer: Self-pay | Admitting: Internal Medicine

## 2013-07-19 NOTE — Telephone Encounter (Signed)
Fax came in from a [place] pharmacy for a knee brace. Pt did not ask for a knee brace.

## 2013-11-10 ENCOUNTER — Ambulatory Visit (INDEPENDENT_AMBULATORY_CARE_PROVIDER_SITE_OTHER): Payer: Medicare PPO | Admitting: Ophthalmology

## 2013-11-24 ENCOUNTER — Ambulatory Visit (INDEPENDENT_AMBULATORY_CARE_PROVIDER_SITE_OTHER): Payer: Medicare PPO | Admitting: Ophthalmology

## 2013-11-24 DIAGNOSIS — E1165 Type 2 diabetes mellitus with hyperglycemia: Secondary | ICD-10-CM

## 2013-11-24 DIAGNOSIS — H35349 Macular cyst, hole, or pseudohole, unspecified eye: Secondary | ICD-10-CM

## 2013-11-24 DIAGNOSIS — E11319 Type 2 diabetes mellitus with unspecified diabetic retinopathy without macular edema: Secondary | ICD-10-CM

## 2013-11-24 DIAGNOSIS — H4010X0 Unspecified open-angle glaucoma, stage unspecified: Secondary | ICD-10-CM

## 2013-11-24 DIAGNOSIS — H43819 Vitreous degeneration, unspecified eye: Secondary | ICD-10-CM

## 2013-11-24 DIAGNOSIS — E1139 Type 2 diabetes mellitus with other diabetic ophthalmic complication: Secondary | ICD-10-CM

## 2013-11-24 DIAGNOSIS — I10 Essential (primary) hypertension: Secondary | ICD-10-CM

## 2013-11-24 DIAGNOSIS — H35039 Hypertensive retinopathy, unspecified eye: Secondary | ICD-10-CM

## 2013-11-29 ENCOUNTER — Other Ambulatory Visit: Payer: Self-pay | Admitting: Family Medicine

## 2013-11-30 MED ORDER — TRAMADOL HCL 50 MG PO TABS: 50.0000 mg | ORAL_TABLET | Freq: Four times a day (QID) | ORAL | Status: DC | PRN

## 2013-11-30 MED ORDER — LOVASTATIN 40 MG PO TABS: 40.0000 mg | ORAL_TABLET | Freq: Every day | ORAL | Status: DC

## 2013-11-30 NOTE — Telephone Encounter (Signed)
Medication called in and appointment scheduled

## 2013-11-30 NOTE — Telephone Encounter (Signed)
Is this ok to refill?  

## 2013-11-30 NOTE — Telephone Encounter (Signed)
Don't let them run out but he needs an appointment 

## 2013-12-04 ENCOUNTER — Encounter: Payer: Self-pay | Admitting: Family Medicine

## 2013-12-07 IMAGING — CR DG CHEST 1V PORT
1 series · 1 of 1 positions shown · non-contrast
Comparison: Chest x-ray 03/16/2011.

CLINICAL DATA: Central line placement.

PORTABLE CHEST - 1 VIEW

[AP]
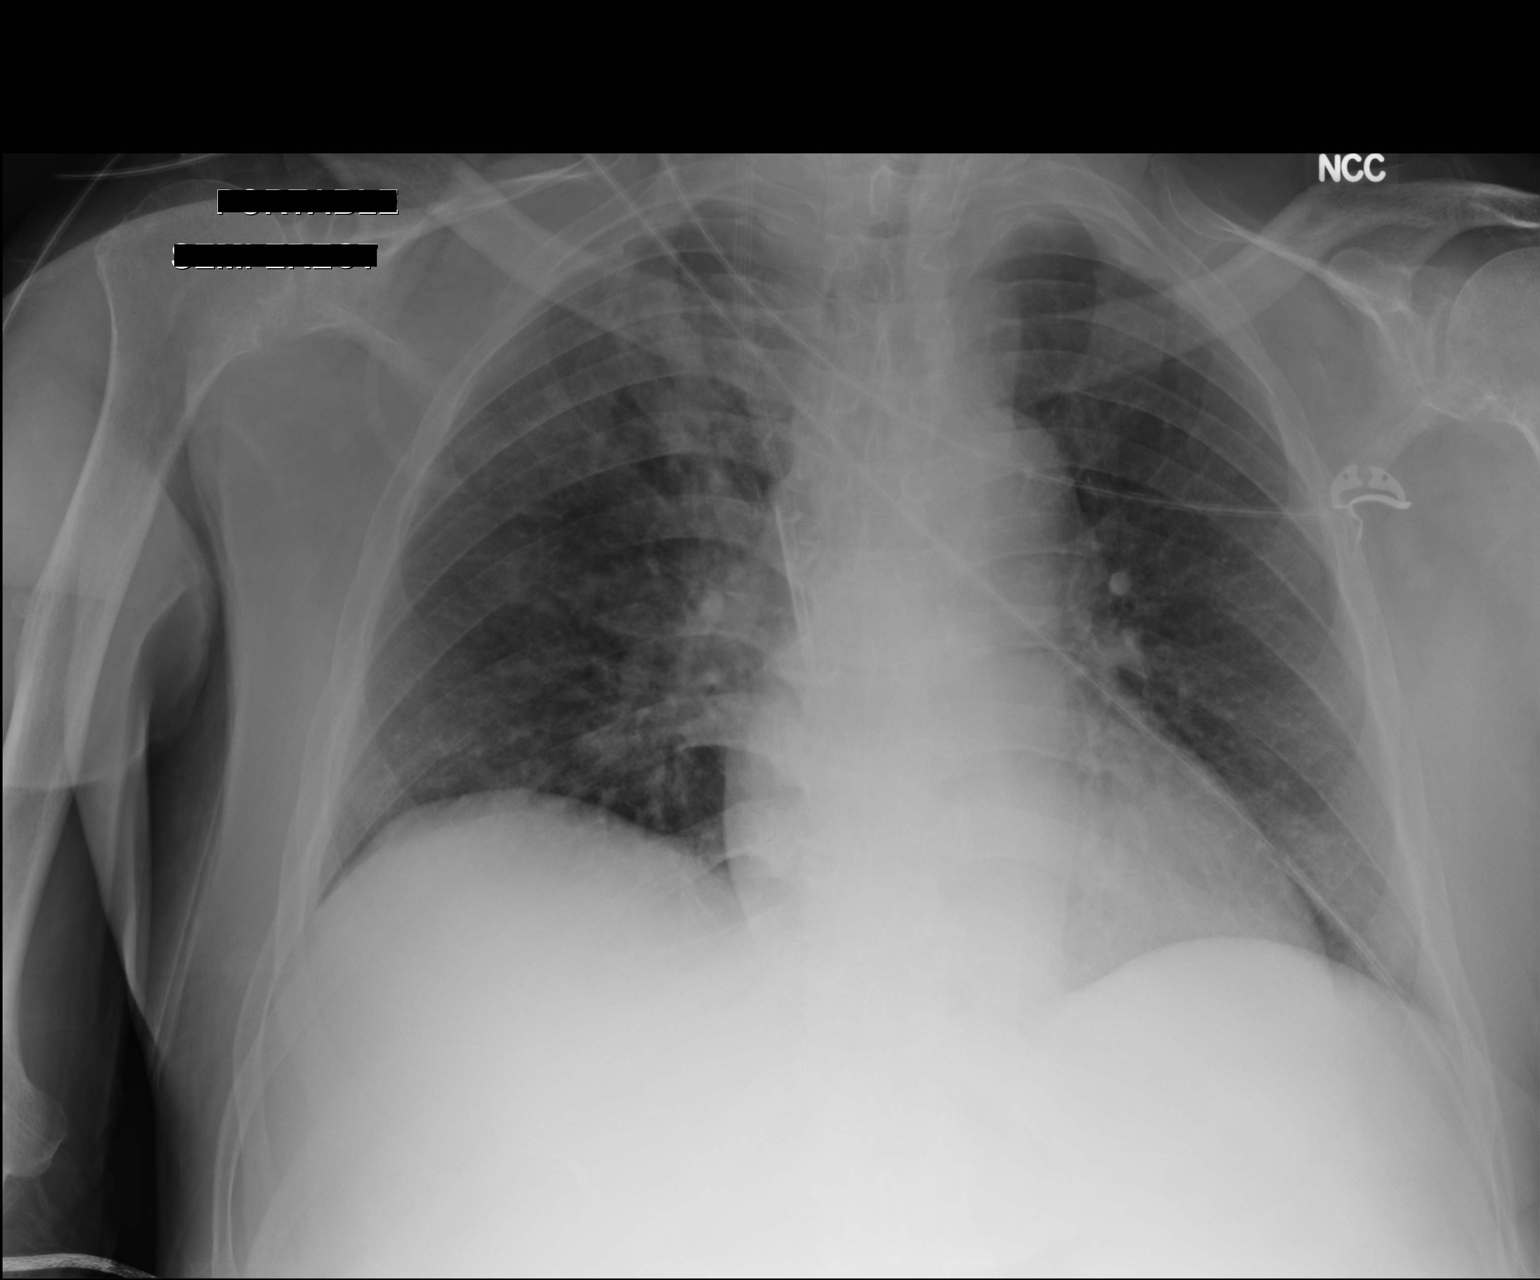

[1 of 1 positions shown; findings below may reference images not displayed]

FINDINGS: Interval placement of right IJ vas cath with tip
terminating in the distal superior vena cava.  Lung volumes are
low.  No definite pneumothorax.  Ill-defined interstitial and
patchy air space disease in the medial aspect of the right upper
and lower lung, concerning for developing infection.  The left lung
is clear.  No pleural effusions.  No evidence of pulmonary edema.
Heart size is normal. The patient is rotated to the left on today's
exam, resulting in distortion of the mediastinal contours and
reduced diagnostic sensitivity and specificity for mediastinal
pathology.  Atherosclerosis in the thoracic aorta.
IMPRESSION: 1.  Tip of vas cath is in the distal superior vena cava. No
pneumothorax.
2.  New ill-defined interstitial and airspace opacities in the
medial aspect of the right lung are concerning for multilobar
pneumonia.  Clinical correlation is recommended.
3.  Atherosclerosis.

## 2013-12-21 ENCOUNTER — Ambulatory Visit (INDEPENDENT_AMBULATORY_CARE_PROVIDER_SITE_OTHER): Payer: Medicare PPO | Admitting: Family Medicine

## 2013-12-21 ENCOUNTER — Encounter: Payer: Self-pay | Admitting: Family Medicine

## 2013-12-21 VITALS — BP 140/90 | HR 64 | Wt 204.0 lb

## 2013-12-21 DIAGNOSIS — E1169 Type 2 diabetes mellitus with other specified complication: Secondary | ICD-10-CM

## 2013-12-21 DIAGNOSIS — E119 Type 2 diabetes mellitus without complications: Secondary | ICD-10-CM

## 2013-12-21 DIAGNOSIS — I10 Essential (primary) hypertension: Secondary | ICD-10-CM

## 2013-12-21 DIAGNOSIS — Z91199 Patient's noncompliance with other medical treatment and regimen due to unspecified reason: Secondary | ICD-10-CM

## 2013-12-21 DIAGNOSIS — E785 Hyperlipidemia, unspecified: Secondary | ICD-10-CM

## 2013-12-21 DIAGNOSIS — E1159 Type 2 diabetes mellitus with other circulatory complications: Secondary | ICD-10-CM

## 2013-12-21 DIAGNOSIS — Z9119 Patient's noncompliance with other medical treatment and regimen: Secondary | ICD-10-CM

## 2013-12-21 LAB — CBC WITH DIFFERENTIAL/PLATELET
BASOS ABS: 0 10*3/uL (ref 0.0–0.1)
BASOS PCT: 0 % (ref 0–1)
EOS ABS: 0.1 10*3/uL (ref 0.0–0.7)
EOS PCT: 3 % (ref 0–5)
HCT: 38.8 % — ABNORMAL LOW (ref 39.0–52.0)
Hemoglobin: 12.6 g/dL — ABNORMAL LOW (ref 13.0–17.0)
Lymphocytes Relative: 31 % (ref 12–46)
Lymphs Abs: 1.3 10*3/uL (ref 0.7–4.0)
MCH: 28.6 pg (ref 26.0–34.0)
MCHC: 32.5 g/dL (ref 30.0–36.0)
MCV: 88.2 fL (ref 78.0–100.0)
Monocytes Absolute: 0.3 10*3/uL (ref 0.1–1.0)
Monocytes Relative: 8 % (ref 3–12)
Neutro Abs: 2.4 10*3/uL (ref 1.7–7.7)
Neutrophils Relative %: 58 % (ref 43–77)
PLATELETS: 204 10*3/uL (ref 150–400)
RBC: 4.4 MIL/uL (ref 4.22–5.81)
RDW: 15.1 % (ref 11.5–15.5)
WBC: 4.2 10*3/uL (ref 4.0–10.5)

## 2013-12-21 LAB — COMPREHENSIVE METABOLIC PANEL
ALK PHOS: 60 U/L (ref 39–117)
ALT: 12 U/L (ref 0–53)
AST: 14 U/L (ref 0–37)
Albumin: 3.7 g/dL (ref 3.5–5.2)
BILIRUBIN TOTAL: 0.5 mg/dL (ref 0.2–1.2)
BUN: 19 mg/dL (ref 6–23)
CHLORIDE: 99 meq/L (ref 96–112)
CO2: 26 mEq/L (ref 19–32)
CREATININE: 1.02 mg/dL (ref 0.50–1.35)
Calcium: 9.1 mg/dL (ref 8.4–10.5)
Glucose, Bld: 298 mg/dL — ABNORMAL HIGH (ref 70–99)
Potassium: 4.4 mEq/L (ref 3.5–5.3)
Sodium: 136 mEq/L (ref 135–145)
Total Protein: 6.6 g/dL (ref 6.0–8.3)

## 2013-12-21 LAB — LIPID PANEL
Cholesterol: 132 mg/dL (ref 0–200)
HDL: 65 mg/dL (ref >39)
LDL CALC: 49 mg/dL (ref 0–99)
Total CHOL/HDL Ratio: 2 Ratio
Triglycerides: 91 mg/dL (ref <150)
VLDL: 18 mg/dL (ref 0–40)

## 2013-12-21 MED ORDER — INSULIN GLARGINE 100 UNIT/ML ~~LOC~~ SOLN: 30.0000 [IU] | Freq: Every day | SUBCUTANEOUS | Status: DC

## 2013-12-21 NOTE — Patient Instructions (Signed)
Get your blood sugar is around 120 in the morning. If you run out of any medications let me know

## 2013-12-21 NOTE — Progress Notes (Deleted)
   Subjective:    Patient ID: Joe Barry, male    DOB: Jul 18, 1934, 80 y.o.   MRN: [id number]  HPI  Joe Barry presents today for a regularly scheduled follow up visit. Joe Barry reports no new complaints and notes that he has been feeling fairly well overall. He reports regularly taking his blood sugars which are consistently in the 160 range. Joe Barry has not been taking his insulin for the last several months as he ran out and never called the office for a refill. He has had regular eye exams and foot exams. He reports no change in his diet and exercise. He does not wish to work on either of these areas at this time as he feels his current efforts are sufficient.   The patient denies any new numbness, tingling or vision changes.   Review of Systems is negative except per HPI.    Objective:   Physical Exam  GEN: Very pleasant male in NAD. Psych: Affect appropriate. Patient denies any changes in mood.  Labs:  HgA1C: 9.8    Assessment & Plan:  Diabetes mellitus - Plan: insulin glargine (LANTUS) 100 UNIT/ML injection, CBC with Differential, Comprehensive metabolic panel, Lipid panel, HgB A1c  Hyperlipidemia LDL goal <70 - Plan: Lipid panel  Hypertension associated with diabetes - Plan: CBC with Differential, Comprehensive metabolic panel  Personal history of noncompliance with medical treatment, presenting hazards to health  Diabetes Mellitus: Patient's insulin was renewed and patient instructed to take 30 units daily. Patient should call the office if he runs out of insulin in the future. The importance of taking his insulin was stressed to the patient. Will follow up in 4 months.

## 2013-12-21 NOTE — Progress Notes (Signed)
   Subjective:    Patient ID: Joe Barry, male    DOB: 01/27/1934, [age] y.oAntonietta [name]   MRN: [id number]  HPI  Joe Barry presents today for a regularly scheduled follow up visit. Joe Barry reports no new complaints and notes that he has been feeling fairly well overall. He reports regularly taking his blood sugars which are consistently in the 160 range. Joe Barry has not been taking his insulin for the last several months as he ran out and never called the office for a refill. He has had regular eye exams and foot exams. He reports no change in his diet and exercise. He does not wish to work on either of these areas at this time as he feels his current efforts are sufficient. Smoking and drinking were reviewed.  The patient denies any new numbness, tingling or vision changes.   Review of Systems is negative except per HPI.    Objective:   Physical Exam  GEN: Very pleasant male in NAD. Psych: Affect appropriate. Patient denies any changes in mood.  Labs:  HgA1C: 9.8    Assessment & Plan:  Diabetes mellitus - Plan: insulin glargine (LANTUS) 100 UNIT/ML injection, CBC with Differential, Comprehensive metabolic panel, Lipid panel, HgB A1c  Hyperlipidemia LDL goal <70 - Plan: Lipid panel  Hypertension associated with diabetes - Plan: CBC with Differential, Comprehensive metabolic panel  Personal history of noncompliance with medical treatment, presenting hazards to health  Diabetes Mellitus: Patient's insulin was renewed and patient instructed to take 30 units daily. Patient should call the office if he runs out of insulin in the future. The importance of taking his insulin was stressed to the patient. Will follow up in 4 months.

## 2014-01-25 ENCOUNTER — Telehealth: Payer: Self-pay | Admitting: Family Medicine

## 2014-01-26 ENCOUNTER — Other Ambulatory Visit: Payer: Self-pay | Admitting: Family Medicine

## 2014-01-26 NOTE — Telephone Encounter (Signed)
This is a refill request. i dont see that the pt is currently on this but i do see it was prescribed back in 2014. Is this okay to refill

## 2014-02-06 ENCOUNTER — Other Ambulatory Visit: Payer: Self-pay | Admitting: Family Medicine

## 2014-02-06 NOTE — Telephone Encounter (Signed)
Is this ok to refill?  

## 2014-02-27 ENCOUNTER — Other Ambulatory Visit: Payer: Self-pay | Admitting: Family Medicine

## 2014-03-05 ENCOUNTER — Other Ambulatory Visit: Payer: Self-pay | Admitting: Family Medicine

## 2014-05-01 ENCOUNTER — Ambulatory Visit: Payer: Medicare PPO | Admitting: Family Medicine

## 2014-05-04 ENCOUNTER — Other Ambulatory Visit: Payer: Self-pay | Admitting: Family Medicine

## 2014-05-09 ENCOUNTER — Encounter: Payer: Self-pay | Admitting: Family Medicine

## 2014-05-09 ENCOUNTER — Ambulatory Visit (INDEPENDENT_AMBULATORY_CARE_PROVIDER_SITE_OTHER): Payer: Medicare PPO | Admitting: Family Medicine

## 2014-05-09 VITALS — BP 128/80 | HR 72 | Wt 192.0 lb

## 2014-05-09 DIAGNOSIS — E1149 Type 2 diabetes mellitus with other diabetic neurological complication: Secondary | ICD-10-CM

## 2014-05-09 DIAGNOSIS — E1169 Type 2 diabetes mellitus with other specified complication: Secondary | ICD-10-CM

## 2014-05-09 DIAGNOSIS — E11618 Type 2 diabetes mellitus with other diabetic arthropathy: Secondary | ICD-10-CM

## 2014-05-09 DIAGNOSIS — I10 Essential (primary) hypertension: Secondary | ICD-10-CM

## 2014-05-09 DIAGNOSIS — E785 Hyperlipidemia, unspecified: Secondary | ICD-10-CM

## 2014-05-09 DIAGNOSIS — E119 Type 2 diabetes mellitus without complications: Secondary | ICD-10-CM

## 2014-05-09 DIAGNOSIS — I152 Hypertension secondary to endocrine disorders: Secondary | ICD-10-CM

## 2014-05-09 DIAGNOSIS — E1159 Type 2 diabetes mellitus with other circulatory complications: Secondary | ICD-10-CM

## 2014-05-09 LAB — POCT GLYCOSYLATED HEMOGLOBIN (HGB A1C)

## 2014-05-09 MED ORDER — CELECOXIB 200 MG PO CAPS: 200.0000 mg | ORAL_CAPSULE | Freq: Two times a day (BID) | ORAL | Status: DC

## 2014-05-09 NOTE — Progress Notes (Signed)
   Subjective:    Patient ID: Joe Barry, male    DOB: Aug 31, 1934, 80 y.o.   MRN: [id number]  HPI He is here for a recheck. He states he is not feeling good however upon further discussion, he is mainly complaining of arthritic aches and pains. He does occasionally use Tylenol and has used Advil/Aleve as well as Voltaren cream. He also occasionally uses tramadol. He checks his blood sugars intermittently and is on Lantus however has not made any major changes in his dosing. He does get his eyes checked frequently. Blood work was done on his last visit. Foot exam done then. He does not smoke and does drink usually 3 shots per day. His exercise is minimal due to his aches and pains. He does use a cane. He continues on medications listed in the chart.   Review of Systems     Objective:   Physical Exam Alert and in no distress. Hemoglobin A1c is 9.3       Assessment & Plan:  Type 2 diabetes mellitus without complication - Plan: POCT glycosylated hemoglobin (Hb A1C)  Hypertension associated with diabetes  Hyperlipidemia LDL goal <70  Arthritis associated with diabetes - Plan: celecoxib (CELEBREX) 200 MG capsule Check your blood sugars either before you eat or 2 hours after you eat Increase your insulin by 2 units every 2 days until your morning blood sugar is below 120 For the aches and pains take 2 Tylenol 4 times per day and see what that does for the aches and pain. Try the Celebrex but do not take Advil or Aleve with it and let me know how works Cut back on the vodka. One shot per day

## 2014-05-09 NOTE — Patient Instructions (Addendum)
Check your blood sugars either before you eat or 2 hours after you eat Increase your insulin by 2 units every 2 days until your morning blood sugar is below 120 For the aches and pains take 2 Tylenol 4 times per day and see what that does for the aches and pain. Try the Celebrex but do not take Advil or Aleve with it and let me know how works Cut back on the vodka. One shot per day

## 2014-05-14 NOTE — Telephone Encounter (Signed)
fyi

## 2014-05-18 ENCOUNTER — Telehealth: Payer: Self-pay | Admitting: Family Medicine

## 2014-05-18 ENCOUNTER — Other Ambulatory Visit: Payer: Self-pay | Admitting: Family Medicine

## 2014-05-18 MED ORDER — METOPROLOL TARTRATE 25 MG PO TABS: ORAL_TABLET | ORAL | Status: DC

## 2014-05-18 MED ORDER — HYDROCHLOROTHIAZIDE 12.5 MG PO CAPS
ORAL_CAPSULE | ORAL | Status: DC
Start: 2014-05-18 — End: 2015-02-18

## 2014-05-18 NOTE — Telephone Encounter (Signed)
Notes states he is suppose to return in 4 months, I have given him refills for 4 months on his bp meds

## 2014-06-12 ENCOUNTER — Other Ambulatory Visit (INDEPENDENT_AMBULATORY_CARE_PROVIDER_SITE_OTHER): Payer: Medicare PPO

## 2014-06-12 DIAGNOSIS — Z23 Encounter for immunization: Secondary | ICD-10-CM

## 2014-07-24 ENCOUNTER — Ambulatory Visit: Payer: Medicare PPO | Admitting: Medical

## 2014-09-24 ENCOUNTER — Ambulatory Visit: Payer: Medicare PPO | Admitting: Family Medicine

## 2014-11-06 DIAGNOSIS — H4011X2 Primary open-angle glaucoma, moderate stage: Secondary | ICD-10-CM | POA: Diagnosis not present

## 2014-11-06 DIAGNOSIS — H35341 Macular cyst, hole, or pseudohole, right eye: Secondary | ICD-10-CM | POA: Diagnosis not present

## 2014-11-23 ENCOUNTER — Telehealth: Payer: Self-pay | Admitting: Medical

## 2014-11-23 DIAGNOSIS — H4011X2 Primary open-angle glaucoma, moderate stage: Secondary | ICD-10-CM | POA: Diagnosis not present

## 2014-11-23 NOTE — Telephone Encounter (Signed)
Called and spoke to daughter and pt is coming in next Tuesday the 15th of march to see Dr. Susann GivensLalonde

## 2014-11-23 NOTE — Telephone Encounter (Signed)
His eye doctor called today.   He is on topical beta blocker for glaucoma, has been on this, but apparently pulse was in the 40s today at the eye doctor.     I advised that we would have him f/u here next week with Dr. Susann GivensLalonde or recheck on BP meds, pulse.    Please schedule f/u visit next week with Dr. Susann GivensLalonde.  Have him bring his medications as well.

## 2014-11-27 ENCOUNTER — Other Ambulatory Visit: Payer: Self-pay | Admitting: Family Medicine

## 2014-11-27 ENCOUNTER — Ambulatory Visit (INDEPENDENT_AMBULATORY_CARE_PROVIDER_SITE_OTHER): Payer: Medicare PPO | Admitting: Family Medicine

## 2014-11-27 ENCOUNTER — Encounter: Payer: Self-pay | Admitting: Family Medicine

## 2014-11-27 ENCOUNTER — Ambulatory Visit (INDEPENDENT_AMBULATORY_CARE_PROVIDER_SITE_OTHER): Payer: Medicare PPO | Admitting: Ophthalmology

## 2014-11-27 VITALS — BP 122/70 | HR 51 | Wt 190.0 lb

## 2014-11-27 DIAGNOSIS — H43812 Vitreous degeneration, left eye: Secondary | ICD-10-CM

## 2014-11-27 DIAGNOSIS — E11319 Type 2 diabetes mellitus with unspecified diabetic retinopathy without macular edema: Secondary | ICD-10-CM | POA: Diagnosis not present

## 2014-11-27 DIAGNOSIS — I10 Essential (primary) hypertension: Secondary | ICD-10-CM | POA: Diagnosis not present

## 2014-11-27 DIAGNOSIS — H35033 Hypertensive retinopathy, bilateral: Secondary | ICD-10-CM | POA: Diagnosis not present

## 2014-11-27 DIAGNOSIS — E119 Type 2 diabetes mellitus without complications: Secondary | ICD-10-CM | POA: Diagnosis not present

## 2014-11-27 DIAGNOSIS — R001 Bradycardia, unspecified: Secondary | ICD-10-CM | POA: Diagnosis not present

## 2014-11-27 DIAGNOSIS — E11329 Type 2 diabetes mellitus with mild nonproliferative diabetic retinopathy without macular edema: Secondary | ICD-10-CM | POA: Diagnosis not present

## 2014-11-27 LAB — CBC WITH DIFFERENTIAL/PLATELET
BASOS ABS: 0.1 10*3/uL (ref 0.0–0.1)
BASOS PCT: 1 % (ref 0–1)
EOS ABS: 0.1 10*3/uL (ref 0.0–0.7)
EOS PCT: 1 % (ref 0–5)
HCT: 41.7 % (ref 39.0–52.0)
Hemoglobin: 13.7 g/dL (ref 13.0–17.0)
Lymphocytes Relative: 35 % (ref 12–46)
Lymphs Abs: 2.1 10*3/uL (ref 0.7–4.0)
MCH: 29.4 pg (ref 26.0–34.0)
MCHC: 32.9 g/dL (ref 30.0–36.0)
MCV: 89.5 fL (ref 78.0–100.0)
MONO ABS: 0.4 10*3/uL (ref 0.1–1.0)
MONOS PCT: 7 % (ref 3–12)
MPV: 11.5 fL (ref 8.6–12.4)
Neutro Abs: 3.4 10*3/uL (ref 1.7–7.7)
Neutrophils Relative %: 56 % (ref 43–77)
PLATELETS: 232 10*3/uL (ref 150–400)
RBC: 4.66 MIL/uL (ref 4.22–5.81)
RDW: 14.6 % (ref 11.5–15.5)
WBC: 6.1 10*3/uL (ref 4.0–10.5)

## 2014-11-27 LAB — COMPREHENSIVE METABOLIC PANEL
ALK PHOS: 55 U/L (ref 39–117)
ALT: 11 U/L (ref 0–53)
AST: 13 U/L (ref 0–37)
Albumin: 4 g/dL (ref 3.5–5.2)
BUN: 20 mg/dL (ref 6–23)
CALCIUM: 9.5 mg/dL (ref 8.4–10.5)
CO2: 26 mEq/L (ref 19–32)
Chloride: 100 mEq/L (ref 96–112)
Creat: 0.84 mg/dL (ref 0.50–1.35)
Glucose, Bld: 191 mg/dL — ABNORMAL HIGH (ref 70–99)
Potassium: 4.3 mEq/L (ref 3.5–5.3)
Sodium: 135 mEq/L (ref 135–145)
Total Bilirubin: 0.7 mg/dL (ref 0.2–1.2)
Total Protein: 7 g/dL (ref 6.0–8.3)

## 2014-11-27 MED ORDER — PIOGLITAZONE HCL-METFORMIN HCL 15-850 MG PO TABS: ORAL_TABLET | ORAL | Status: DC

## 2014-11-27 NOTE — Progress Notes (Signed)
   Subjective:    Patient ID: Joe Barry, male    DOB: August 22, 1934, 81 y.o.   MRN: [id number]  HPI He is here for consultation. He was seen recently at his ophthalmologist office and noted to have a pulse of 40. He has had no chest pain, palpitations, syncopal episodes. Review his record indicates he is on metopic wall. He also needs a refill on his Actos plus.  Review of Systems     Objective:   Physical Exam Alert and in no distress. Cardiac exam does show a bradycardia. EKG does show bradycardia with PACs. EKG was discussed with Dr. Lovena Le. He recommended holding the metopic well which I have already done and recheck this in one week.       Assessment & Plan:  Bradycardia - Plan: CBC with Differential/Platelet, Comprehensive metabolic panel, Ambulatory referral to Cardiology, EKG 12-Lead  Type 2 diabetes mellitus without complication - Plan: pioglitazone-metformin (ACTOPLUS MET) 15-850 MG per tablet The therapy was discussed with him and his daughter who is nurse and they are in agreement on this. He will be seen here again in one week.

## 2014-11-27 NOTE — Patient Instructions (Signed)
Stop the metoprolol.

## 2014-12-04 ENCOUNTER — Ambulatory Visit (INDEPENDENT_AMBULATORY_CARE_PROVIDER_SITE_OTHER): Payer: Medicare PPO | Admitting: Family Medicine

## 2014-12-04 ENCOUNTER — Encounter: Payer: Self-pay | Admitting: Family Medicine

## 2014-12-04 VITALS — BP 130/70 | HR 75 | Wt 187.0 lb

## 2014-12-04 DIAGNOSIS — R001 Bradycardia, unspecified: Secondary | ICD-10-CM

## 2014-12-04 NOTE — Progress Notes (Signed)
   Subjective:    Patient ID: Joe Barry, male    DOB: 10-15-1933, 81 y.o.   MRN: [id number]  HPI He is here for recheck on bradycardia. He remains symptom-free. His daughter who is a nurse has been checking his pulse and it has been in the low 60 range.   Review of Systems     Objective:   Physical Exam Alert and in no distress. Heart rate is recorded. EKG Shows sinus pauses and PACs.       Assessment & Plan:  Bradycardia We will continue on present medication regimen. His daughter who is an ICU nurse is comfortable with the present approach as is the patient.

## 2014-12-31 ENCOUNTER — Telehealth: Payer: Self-pay | Admitting: Internal Medicine

## 2014-12-31 NOTE — Telephone Encounter (Signed)
PT DOES NOT USE [facility] FOR HIS DIABETIC SUPPLIES

## 2014-12-31 NOTE — Telephone Encounter (Signed)
Called and left message for pt to call me back to find out if he is wanting diabetic supplies from [facility]

## 2015-02-18 ENCOUNTER — Other Ambulatory Visit: Payer: Self-pay | Admitting: Family Medicine

## 2015-02-23 ENCOUNTER — Other Ambulatory Visit: Payer: Self-pay | Admitting: Family Medicine

## 2015-03-22 ENCOUNTER — Other Ambulatory Visit: Payer: Self-pay | Admitting: Family Medicine

## 2015-03-29 DIAGNOSIS — H4011X2 Primary open-angle glaucoma, moderate stage: Secondary | ICD-10-CM | POA: Diagnosis not present

## 2015-05-14 ENCOUNTER — Encounter: Payer: Self-pay | Admitting: Family Medicine

## 2015-05-14 ENCOUNTER — Ambulatory Visit (INDEPENDENT_AMBULATORY_CARE_PROVIDER_SITE_OTHER): Payer: Medicare PPO | Admitting: Family Medicine

## 2015-05-14 VITALS — BP 132/72 | HR 76 | Wt 196.6 lb

## 2015-05-14 DIAGNOSIS — Z23 Encounter for immunization: Secondary | ICD-10-CM | POA: Diagnosis not present

## 2015-05-14 DIAGNOSIS — M25561 Pain in right knee: Secondary | ICD-10-CM

## 2015-05-14 DIAGNOSIS — H6123 Impacted cerumen, bilateral: Secondary | ICD-10-CM | POA: Diagnosis not present

## 2015-05-14 NOTE — Progress Notes (Signed)
   Subjective:    Patient ID: Joe Barry, male    DOB: 10-Aug-1934, 81 y.o.   MRN: [id number]  HPI He fell one week ago at home and laid on the floor until someone was able to come by and help him up. He did not seek care for the right knee pain that he had due to the fall. He also is using a walker but has never been taught how to use this. He also uses a cane around the house. He does continue to drive. He also complains of ear wax.   Review of Systems     Objective:   Physical Exam Exam of the right knee shows no effusion or evidence of abrasion or contusion. Does complain of slight discomfort when checking the medial collateral ligament but it is not lax. Lateral collateral ligament and anterior drawer are normal. Both ears were lavaged and instrumentation was used to remove cotton from both ears.       Assessment & Plan:  Right knee pain  Need for prophylactic vaccination and inoculation against influenza - Plan: Flu vaccine HIGH DOSE PF (Fluzone High dose)  Cerumen impaction, bilateral Discussed the possibility of him going into assisted living since he is having difficulty with falls. He is resistant to this. I will therefore have home health agency go by and check out his needs in regard to PT, OT and to help assess his driving ability.

## 2015-05-21 ENCOUNTER — Other Ambulatory Visit: Payer: Self-pay | Admitting: Family Medicine

## 2015-06-02 ENCOUNTER — Other Ambulatory Visit: Payer: Self-pay | Admitting: Family Medicine

## 2015-06-14 ENCOUNTER — Telehealth: Payer: Self-pay | Admitting: Family Medicine

## 2015-06-14 NOTE — Telephone Encounter (Signed)
I have went back to his last ov seen where dr.lalonde had suggested this I have put an order in to [place]

## 2015-06-14 NOTE — Telephone Encounter (Signed)
Called pt to set up a CPE appt, since its been 2 years since he has been, his daughter said that someone was suppose to get in touch with them about home health coming to see them, for PT and to see about him driving, and help him with the cane, said no one ever called them about it, she is going to call me back to set up the appt, best phone number is [phone].

## 2015-06-18 ENCOUNTER — Telehealth: Payer: Self-pay | Admitting: Family Medicine

## 2015-06-18 NOTE — Telephone Encounter (Signed)
They received referral to go out to Joe Barry's home and will be going out today to evaluate for PT & OT  referral form has box that needs to be checked for PT/OT, please check that box and refax to [facility] [place] fax# [phone] if it is all right to proceed with PT/OT due to falls

## 2015-06-19 DIAGNOSIS — M1990 Unspecified osteoarthritis, unspecified site: Secondary | ICD-10-CM | POA: Diagnosis not present

## 2015-06-19 DIAGNOSIS — I10 Essential (primary) hypertension: Secondary | ICD-10-CM | POA: Diagnosis not present

## 2015-06-19 DIAGNOSIS — E119 Type 2 diabetes mellitus without complications: Secondary | ICD-10-CM | POA: Diagnosis not present

## 2015-06-19 DIAGNOSIS — M25561 Pain in right knee: Secondary | ICD-10-CM | POA: Diagnosis not present

## 2015-06-19 DIAGNOSIS — W19XXXD Unspecified fall, subsequent encounter: Secondary | ICD-10-CM | POA: Diagnosis not present

## 2015-06-19 DIAGNOSIS — I739 Peripheral vascular disease, unspecified: Secondary | ICD-10-CM | POA: Diagnosis not present

## 2015-06-21 DIAGNOSIS — M25561 Pain in right knee: Secondary | ICD-10-CM | POA: Diagnosis not present

## 2015-06-21 DIAGNOSIS — I10 Essential (primary) hypertension: Secondary | ICD-10-CM | POA: Diagnosis not present

## 2015-06-21 DIAGNOSIS — E119 Type 2 diabetes mellitus without complications: Secondary | ICD-10-CM | POA: Diagnosis not present

## 2015-06-21 DIAGNOSIS — I739 Peripheral vascular disease, unspecified: Secondary | ICD-10-CM | POA: Diagnosis not present

## 2015-06-21 DIAGNOSIS — M1990 Unspecified osteoarthritis, unspecified site: Secondary | ICD-10-CM | POA: Diagnosis not present

## 2015-06-21 DIAGNOSIS — W19XXXD Unspecified fall, subsequent encounter: Secondary | ICD-10-CM | POA: Diagnosis not present

## 2015-06-25 DIAGNOSIS — E119 Type 2 diabetes mellitus without complications: Secondary | ICD-10-CM | POA: Diagnosis not present

## 2015-06-25 DIAGNOSIS — M1990 Unspecified osteoarthritis, unspecified site: Secondary | ICD-10-CM | POA: Diagnosis not present

## 2015-06-25 DIAGNOSIS — I10 Essential (primary) hypertension: Secondary | ICD-10-CM | POA: Diagnosis not present

## 2015-06-25 DIAGNOSIS — I739 Peripheral vascular disease, unspecified: Secondary | ICD-10-CM | POA: Diagnosis not present

## 2015-06-25 DIAGNOSIS — W19XXXD Unspecified fall, subsequent encounter: Secondary | ICD-10-CM | POA: Diagnosis not present

## 2015-06-25 DIAGNOSIS — M25561 Pain in right knee: Secondary | ICD-10-CM | POA: Diagnosis not present

## 2015-06-26 DIAGNOSIS — I739 Peripheral vascular disease, unspecified: Secondary | ICD-10-CM | POA: Diagnosis not present

## 2015-06-26 DIAGNOSIS — E119 Type 2 diabetes mellitus without complications: Secondary | ICD-10-CM | POA: Diagnosis not present

## 2015-06-26 DIAGNOSIS — M1990 Unspecified osteoarthritis, unspecified site: Secondary | ICD-10-CM | POA: Diagnosis not present

## 2015-06-26 DIAGNOSIS — I10 Essential (primary) hypertension: Secondary | ICD-10-CM | POA: Diagnosis not present

## 2015-06-26 DIAGNOSIS — W19XXXD Unspecified fall, subsequent encounter: Secondary | ICD-10-CM | POA: Diagnosis not present

## 2015-06-26 DIAGNOSIS — M25561 Pain in right knee: Secondary | ICD-10-CM | POA: Diagnosis not present

## 2015-06-28 DIAGNOSIS — I10 Essential (primary) hypertension: Secondary | ICD-10-CM | POA: Diagnosis not present

## 2015-06-28 DIAGNOSIS — M25561 Pain in right knee: Secondary | ICD-10-CM | POA: Diagnosis not present

## 2015-06-28 DIAGNOSIS — I739 Peripheral vascular disease, unspecified: Secondary | ICD-10-CM | POA: Diagnosis not present

## 2015-06-28 DIAGNOSIS — E119 Type 2 diabetes mellitus without complications: Secondary | ICD-10-CM | POA: Diagnosis not present

## 2015-06-28 DIAGNOSIS — M1990 Unspecified osteoarthritis, unspecified site: Secondary | ICD-10-CM | POA: Diagnosis not present

## 2015-06-28 DIAGNOSIS — W19XXXD Unspecified fall, subsequent encounter: Secondary | ICD-10-CM | POA: Diagnosis not present

## 2015-06-30 DIAGNOSIS — I10 Essential (primary) hypertension: Secondary | ICD-10-CM | POA: Diagnosis not present

## 2015-06-30 DIAGNOSIS — E119 Type 2 diabetes mellitus without complications: Secondary | ICD-10-CM | POA: Diagnosis not present

## 2015-06-30 DIAGNOSIS — M1990 Unspecified osteoarthritis, unspecified site: Secondary | ICD-10-CM | POA: Diagnosis not present

## 2015-06-30 DIAGNOSIS — W19XXXD Unspecified fall, subsequent encounter: Secondary | ICD-10-CM | POA: Diagnosis not present

## 2015-06-30 DIAGNOSIS — M25561 Pain in right knee: Secondary | ICD-10-CM | POA: Diagnosis not present

## 2015-06-30 DIAGNOSIS — I739 Peripheral vascular disease, unspecified: Secondary | ICD-10-CM | POA: Diagnosis not present

## 2015-07-01 DIAGNOSIS — I739 Peripheral vascular disease, unspecified: Secondary | ICD-10-CM | POA: Diagnosis not present

## 2015-07-01 DIAGNOSIS — E119 Type 2 diabetes mellitus without complications: Secondary | ICD-10-CM | POA: Diagnosis not present

## 2015-07-01 DIAGNOSIS — M1990 Unspecified osteoarthritis, unspecified site: Secondary | ICD-10-CM | POA: Diagnosis not present

## 2015-07-01 DIAGNOSIS — I10 Essential (primary) hypertension: Secondary | ICD-10-CM | POA: Diagnosis not present

## 2015-07-01 DIAGNOSIS — M25561 Pain in right knee: Secondary | ICD-10-CM | POA: Diagnosis not present

## 2015-07-01 DIAGNOSIS — W19XXXD Unspecified fall, subsequent encounter: Secondary | ICD-10-CM | POA: Diagnosis not present

## 2015-07-03 DIAGNOSIS — W19XXXD Unspecified fall, subsequent encounter: Secondary | ICD-10-CM | POA: Diagnosis not present

## 2015-07-03 DIAGNOSIS — M1990 Unspecified osteoarthritis, unspecified site: Secondary | ICD-10-CM | POA: Diagnosis not present

## 2015-07-03 DIAGNOSIS — I739 Peripheral vascular disease, unspecified: Secondary | ICD-10-CM | POA: Diagnosis not present

## 2015-07-03 DIAGNOSIS — I10 Essential (primary) hypertension: Secondary | ICD-10-CM | POA: Diagnosis not present

## 2015-07-03 DIAGNOSIS — M25561 Pain in right knee: Secondary | ICD-10-CM | POA: Diagnosis not present

## 2015-07-03 DIAGNOSIS — E119 Type 2 diabetes mellitus without complications: Secondary | ICD-10-CM | POA: Diagnosis not present

## 2015-07-04 DIAGNOSIS — M25561 Pain in right knee: Secondary | ICD-10-CM | POA: Diagnosis not present

## 2015-07-04 DIAGNOSIS — I10 Essential (primary) hypertension: Secondary | ICD-10-CM | POA: Diagnosis not present

## 2015-07-04 DIAGNOSIS — W19XXXD Unspecified fall, subsequent encounter: Secondary | ICD-10-CM | POA: Diagnosis not present

## 2015-07-04 DIAGNOSIS — I739 Peripheral vascular disease, unspecified: Secondary | ICD-10-CM | POA: Diagnosis not present

## 2015-07-04 DIAGNOSIS — M1990 Unspecified osteoarthritis, unspecified site: Secondary | ICD-10-CM | POA: Diagnosis not present

## 2015-07-04 DIAGNOSIS — E119 Type 2 diabetes mellitus without complications: Secondary | ICD-10-CM | POA: Diagnosis not present

## 2015-07-11 DIAGNOSIS — M1990 Unspecified osteoarthritis, unspecified site: Secondary | ICD-10-CM | POA: Diagnosis not present

## 2015-07-11 DIAGNOSIS — I10 Essential (primary) hypertension: Secondary | ICD-10-CM | POA: Diagnosis not present

## 2015-07-11 DIAGNOSIS — W19XXXD Unspecified fall, subsequent encounter: Secondary | ICD-10-CM | POA: Diagnosis not present

## 2015-07-11 DIAGNOSIS — E119 Type 2 diabetes mellitus without complications: Secondary | ICD-10-CM | POA: Diagnosis not present

## 2015-07-11 DIAGNOSIS — I739 Peripheral vascular disease, unspecified: Secondary | ICD-10-CM | POA: Diagnosis not present

## 2015-07-11 DIAGNOSIS — M25561 Pain in right knee: Secondary | ICD-10-CM | POA: Diagnosis not present

## 2015-07-17 DIAGNOSIS — I10 Essential (primary) hypertension: Secondary | ICD-10-CM | POA: Diagnosis not present

## 2015-07-17 DIAGNOSIS — M1990 Unspecified osteoarthritis, unspecified site: Secondary | ICD-10-CM | POA: Diagnosis not present

## 2015-07-17 DIAGNOSIS — I739 Peripheral vascular disease, unspecified: Secondary | ICD-10-CM | POA: Diagnosis not present

## 2015-07-17 DIAGNOSIS — W19XXXD Unspecified fall, subsequent encounter: Secondary | ICD-10-CM | POA: Diagnosis not present

## 2015-07-17 DIAGNOSIS — M25561 Pain in right knee: Secondary | ICD-10-CM | POA: Diagnosis not present

## 2015-07-17 DIAGNOSIS — E119 Type 2 diabetes mellitus without complications: Secondary | ICD-10-CM | POA: Diagnosis not present

## 2015-07-18 ENCOUNTER — Telehealth: Payer: Self-pay | Admitting: Family Medicine

## 2015-07-18 DIAGNOSIS — M25561 Pain in right knee: Secondary | ICD-10-CM | POA: Diagnosis not present

## 2015-07-18 DIAGNOSIS — I10 Essential (primary) hypertension: Secondary | ICD-10-CM | POA: Diagnosis not present

## 2015-07-18 DIAGNOSIS — W19XXXD Unspecified fall, subsequent encounter: Secondary | ICD-10-CM | POA: Diagnosis not present

## 2015-07-18 DIAGNOSIS — E119 Type 2 diabetes mellitus without complications: Secondary | ICD-10-CM | POA: Diagnosis not present

## 2015-07-18 DIAGNOSIS — M1990 Unspecified osteoarthritis, unspecified site: Secondary | ICD-10-CM | POA: Diagnosis not present

## 2015-07-18 DIAGNOSIS — I739 Peripheral vascular disease, unspecified: Secondary | ICD-10-CM | POA: Diagnosis not present

## 2015-07-18 NOTE — Telephone Encounter (Signed)
Ron ParkerJim Bell, therapist with [facility], called to get verbal order to continue therapy twice a week for 4 weeks

## 2015-07-18 NOTE — Telephone Encounter (Signed)
Ok

## 2015-07-18 NOTE — Telephone Encounter (Signed)
i called jim bell and gave verbal order per [employer]

## 2015-07-19 DIAGNOSIS — M25561 Pain in right knee: Secondary | ICD-10-CM | POA: Diagnosis not present

## 2015-07-19 DIAGNOSIS — E119 Type 2 diabetes mellitus without complications: Secondary | ICD-10-CM | POA: Diagnosis not present

## 2015-07-19 DIAGNOSIS — M1990 Unspecified osteoarthritis, unspecified site: Secondary | ICD-10-CM | POA: Diagnosis not present

## 2015-07-19 DIAGNOSIS — I10 Essential (primary) hypertension: Secondary | ICD-10-CM | POA: Diagnosis not present

## 2015-07-19 DIAGNOSIS — I739 Peripheral vascular disease, unspecified: Secondary | ICD-10-CM | POA: Diagnosis not present

## 2015-07-19 DIAGNOSIS — W19XXXD Unspecified fall, subsequent encounter: Secondary | ICD-10-CM | POA: Diagnosis not present

## 2015-07-23 DIAGNOSIS — I10 Essential (primary) hypertension: Secondary | ICD-10-CM | POA: Diagnosis not present

## 2015-07-23 DIAGNOSIS — E119 Type 2 diabetes mellitus without complications: Secondary | ICD-10-CM | POA: Diagnosis not present

## 2015-07-23 DIAGNOSIS — I739 Peripheral vascular disease, unspecified: Secondary | ICD-10-CM | POA: Diagnosis not present

## 2015-07-23 DIAGNOSIS — W19XXXD Unspecified fall, subsequent encounter: Secondary | ICD-10-CM | POA: Diagnosis not present

## 2015-07-23 DIAGNOSIS — M25561 Pain in right knee: Secondary | ICD-10-CM | POA: Diagnosis not present

## 2015-07-23 DIAGNOSIS — M1990 Unspecified osteoarthritis, unspecified site: Secondary | ICD-10-CM | POA: Diagnosis not present

## 2015-07-26 DIAGNOSIS — E119 Type 2 diabetes mellitus without complications: Secondary | ICD-10-CM | POA: Diagnosis not present

## 2015-07-26 DIAGNOSIS — I739 Peripheral vascular disease, unspecified: Secondary | ICD-10-CM | POA: Diagnosis not present

## 2015-07-26 DIAGNOSIS — W19XXXD Unspecified fall, subsequent encounter: Secondary | ICD-10-CM | POA: Diagnosis not present

## 2015-07-26 DIAGNOSIS — I10 Essential (primary) hypertension: Secondary | ICD-10-CM | POA: Diagnosis not present

## 2015-07-26 DIAGNOSIS — M1990 Unspecified osteoarthritis, unspecified site: Secondary | ICD-10-CM | POA: Diagnosis not present

## 2015-07-26 DIAGNOSIS — M25561 Pain in right knee: Secondary | ICD-10-CM | POA: Diagnosis not present

## 2015-07-30 DIAGNOSIS — H35341 Macular cyst, hole, or pseudohole, right eye: Secondary | ICD-10-CM | POA: Diagnosis not present

## 2015-07-30 DIAGNOSIS — H35033 Hypertensive retinopathy, bilateral: Secondary | ICD-10-CM | POA: Diagnosis not present

## 2015-07-30 DIAGNOSIS — E119 Type 2 diabetes mellitus without complications: Secondary | ICD-10-CM | POA: Diagnosis not present

## 2015-07-30 DIAGNOSIS — H401132 Primary open-angle glaucoma, bilateral, moderate stage: Secondary | ICD-10-CM | POA: Diagnosis not present

## 2015-07-30 LAB — HM DIABETES EYE EXAM

## 2015-07-31 ENCOUNTER — Encounter: Payer: Self-pay | Admitting: Family Medicine

## 2015-07-31 DIAGNOSIS — W19XXXD Unspecified fall, subsequent encounter: Secondary | ICD-10-CM | POA: Diagnosis not present

## 2015-07-31 DIAGNOSIS — I739 Peripheral vascular disease, unspecified: Secondary | ICD-10-CM | POA: Diagnosis not present

## 2015-07-31 DIAGNOSIS — M25561 Pain in right knee: Secondary | ICD-10-CM | POA: Diagnosis not present

## 2015-07-31 DIAGNOSIS — I10 Essential (primary) hypertension: Secondary | ICD-10-CM | POA: Diagnosis not present

## 2015-07-31 DIAGNOSIS — E119 Type 2 diabetes mellitus without complications: Secondary | ICD-10-CM | POA: Diagnosis not present

## 2015-07-31 DIAGNOSIS — M1990 Unspecified osteoarthritis, unspecified site: Secondary | ICD-10-CM | POA: Diagnosis not present

## 2015-08-02 DIAGNOSIS — I10 Essential (primary) hypertension: Secondary | ICD-10-CM | POA: Diagnosis not present

## 2015-08-02 DIAGNOSIS — E119 Type 2 diabetes mellitus without complications: Secondary | ICD-10-CM | POA: Diagnosis not present

## 2015-08-02 DIAGNOSIS — I739 Peripheral vascular disease, unspecified: Secondary | ICD-10-CM | POA: Diagnosis not present

## 2015-08-02 DIAGNOSIS — W19XXXD Unspecified fall, subsequent encounter: Secondary | ICD-10-CM | POA: Diagnosis not present

## 2015-08-02 DIAGNOSIS — M25561 Pain in right knee: Secondary | ICD-10-CM | POA: Diagnosis not present

## 2015-08-02 DIAGNOSIS — M1990 Unspecified osteoarthritis, unspecified site: Secondary | ICD-10-CM | POA: Diagnosis not present

## 2015-08-05 DIAGNOSIS — M1990 Unspecified osteoarthritis, unspecified site: Secondary | ICD-10-CM | POA: Diagnosis not present

## 2015-08-05 DIAGNOSIS — M25561 Pain in right knee: Secondary | ICD-10-CM | POA: Diagnosis not present

## 2015-08-05 DIAGNOSIS — E119 Type 2 diabetes mellitus without complications: Secondary | ICD-10-CM | POA: Diagnosis not present

## 2015-08-05 DIAGNOSIS — I10 Essential (primary) hypertension: Secondary | ICD-10-CM | POA: Diagnosis not present

## 2015-08-05 DIAGNOSIS — I739 Peripheral vascular disease, unspecified: Secondary | ICD-10-CM | POA: Diagnosis not present

## 2015-08-05 DIAGNOSIS — W19XXXD Unspecified fall, subsequent encounter: Secondary | ICD-10-CM | POA: Diagnosis not present

## 2015-08-07 DIAGNOSIS — W19XXXD Unspecified fall, subsequent encounter: Secondary | ICD-10-CM | POA: Diagnosis not present

## 2015-08-07 DIAGNOSIS — I739 Peripheral vascular disease, unspecified: Secondary | ICD-10-CM | POA: Diagnosis not present

## 2015-08-07 DIAGNOSIS — M1990 Unspecified osteoarthritis, unspecified site: Secondary | ICD-10-CM | POA: Diagnosis not present

## 2015-08-07 DIAGNOSIS — M25561 Pain in right knee: Secondary | ICD-10-CM | POA: Diagnosis not present

## 2015-08-07 DIAGNOSIS — I10 Essential (primary) hypertension: Secondary | ICD-10-CM | POA: Diagnosis not present

## 2015-08-07 DIAGNOSIS — E119 Type 2 diabetes mellitus without complications: Secondary | ICD-10-CM | POA: Diagnosis not present

## 2015-08-12 DIAGNOSIS — W19XXXD Unspecified fall, subsequent encounter: Secondary | ICD-10-CM | POA: Diagnosis not present

## 2015-08-12 DIAGNOSIS — E119 Type 2 diabetes mellitus without complications: Secondary | ICD-10-CM | POA: Diagnosis not present

## 2015-08-12 DIAGNOSIS — I739 Peripheral vascular disease, unspecified: Secondary | ICD-10-CM | POA: Diagnosis not present

## 2015-08-12 DIAGNOSIS — M1990 Unspecified osteoarthritis, unspecified site: Secondary | ICD-10-CM | POA: Diagnosis not present

## 2015-08-12 DIAGNOSIS — M25561 Pain in right knee: Secondary | ICD-10-CM | POA: Diagnosis not present

## 2015-08-12 DIAGNOSIS — I10 Essential (primary) hypertension: Secondary | ICD-10-CM | POA: Diagnosis not present

## 2015-08-15 DIAGNOSIS — M25561 Pain in right knee: Secondary | ICD-10-CM | POA: Diagnosis not present

## 2015-08-15 DIAGNOSIS — W19XXXD Unspecified fall, subsequent encounter: Secondary | ICD-10-CM | POA: Diagnosis not present

## 2015-08-15 DIAGNOSIS — E119 Type 2 diabetes mellitus without complications: Secondary | ICD-10-CM | POA: Diagnosis not present

## 2015-08-15 DIAGNOSIS — I739 Peripheral vascular disease, unspecified: Secondary | ICD-10-CM | POA: Diagnosis not present

## 2015-08-15 DIAGNOSIS — I10 Essential (primary) hypertension: Secondary | ICD-10-CM | POA: Diagnosis not present

## 2015-08-15 DIAGNOSIS — M1990 Unspecified osteoarthritis, unspecified site: Secondary | ICD-10-CM | POA: Diagnosis not present

## 2015-08-29 ENCOUNTER — Telehealth: Payer: Self-pay | Admitting: Family Medicine

## 2015-08-29 NOTE — Telephone Encounter (Signed)
Left message needs appt for med ck or CPE-lm 12/15

## 2015-09-13 ENCOUNTER — Telehealth: Payer: Self-pay | Admitting: Family Medicine

## 2015-09-13 DIAGNOSIS — E785 Hyperlipidemia, unspecified: Secondary | ICD-10-CM

## 2015-09-13 DIAGNOSIS — E119 Type 2 diabetes mellitus without complications: Secondary | ICD-10-CM

## 2015-09-13 NOTE — Telephone Encounter (Signed)
Future orders in and lm for daughter

## 2015-09-13 NOTE — Telephone Encounter (Signed)
Pt has Med Ck appt on 1/4 at 2:30. Daughter would like to bring pt in on 1/3 for fasting labs. Is this OK? Call daughter , Alcario Droughtrica, at [phone] to let her know if this is approved

## 2015-09-13 NOTE — Telephone Encounter (Signed)
Ok do cbc,cmet ,lipids

## 2015-09-17 ENCOUNTER — Other Ambulatory Visit: Payer: Medicare PPO

## 2015-09-17 DIAGNOSIS — E785 Hyperlipidemia, unspecified: Secondary | ICD-10-CM

## 2015-09-17 DIAGNOSIS — E119 Type 2 diabetes mellitus without complications: Secondary | ICD-10-CM

## 2015-09-17 LAB — CBC WITH DIFFERENTIAL/PLATELET
BASOS ABS: 0 10*3/uL (ref 0.0–0.1)
Basophils Relative: 0 % (ref 0–1)
Eosinophils Absolute: 0.2 10*3/uL (ref 0.0–0.7)
Eosinophils Relative: 3 % (ref 0–5)
HCT: 36.2 % — ABNORMAL LOW (ref 39.0–52.0)
HEMOGLOBIN: 11.6 g/dL — AB (ref 13.0–17.0)
Lymphocytes Relative: 35 % (ref 12–46)
Lymphs Abs: 1.8 10*3/uL (ref 0.7–4.0)
MCH: 28.8 pg (ref 26.0–34.0)
MCHC: 32 g/dL (ref 30.0–36.0)
MCV: 89.8 fL (ref 78.0–100.0)
MPV: 10.8 fL (ref 8.6–12.4)
Monocytes Absolute: 0.4 10*3/uL (ref 0.1–1.0)
Monocytes Relative: 8 % (ref 3–12)
NEUTROS PCT: 54 % (ref 43–77)
Neutro Abs: 2.7 10*3/uL (ref 1.7–7.7)
Platelets: 234 10*3/uL (ref 150–400)
RBC: 4.03 MIL/uL — AB (ref 4.22–5.81)
RDW: 16.3 % — ABNORMAL HIGH (ref 11.5–15.5)
WBC: 5 10*3/uL (ref 4.0–10.5)

## 2015-09-18 ENCOUNTER — Ambulatory Visit (INDEPENDENT_AMBULATORY_CARE_PROVIDER_SITE_OTHER): Payer: Medicare Other | Admitting: Family Medicine

## 2015-09-18 ENCOUNTER — Encounter: Payer: Self-pay | Admitting: Family Medicine

## 2015-09-18 VITALS — BP 140/90 | Wt 193.0 lb

## 2015-09-18 DIAGNOSIS — E1159 Type 2 diabetes mellitus with other circulatory complications: Secondary | ICD-10-CM | POA: Diagnosis not present

## 2015-09-18 DIAGNOSIS — E119 Type 2 diabetes mellitus without complications: Secondary | ICD-10-CM | POA: Diagnosis not present

## 2015-09-18 DIAGNOSIS — E785 Hyperlipidemia, unspecified: Secondary | ICD-10-CM | POA: Diagnosis not present

## 2015-09-18 DIAGNOSIS — Z794 Long term (current) use of insulin: Secondary | ICD-10-CM | POA: Diagnosis not present

## 2015-09-18 DIAGNOSIS — E113293 Type 2 diabetes mellitus with mild nonproliferative diabetic retinopathy without macular edema, bilateral: Secondary | ICD-10-CM | POA: Diagnosis not present

## 2015-09-18 DIAGNOSIS — I10 Essential (primary) hypertension: Secondary | ICD-10-CM | POA: Diagnosis not present

## 2015-09-18 DIAGNOSIS — E11618 Type 2 diabetes mellitus with other diabetic arthropathy: Secondary | ICD-10-CM

## 2015-09-18 DIAGNOSIS — Z23 Encounter for immunization: Secondary | ICD-10-CM

## 2015-09-18 LAB — COMPLETE METABOLIC PANEL WITH GFR
ALT: 12 U/L (ref 9–46)
AST: 15 U/L (ref 10–35)
Albumin: 4 g/dL (ref 3.6–5.1)
Alkaline Phosphatase: 59 U/L (ref 40–115)
BUN: 33 mg/dL — AB (ref 7–25)
CHLORIDE: 102 mmol/L (ref 98–110)
CO2: 25 mmol/L (ref 20–31)
Calcium: 9.4 mg/dL (ref 8.6–10.3)
Creat: 1.03 mg/dL (ref 0.70–1.18)
GFR, Est African American: 79 mL/min (ref >=60)
GFR, Est Non African American: 69 mL/min (ref >=60)
GLUCOSE: 102 mg/dL — AB (ref 65–99)
POTASSIUM: 4.4 mmol/L (ref 3.5–5.3)
SODIUM: 136 mmol/L (ref 135–146)
Total Bilirubin: 0.6 mg/dL (ref 0.2–1.2)
Total Protein: 6.7 g/dL (ref 6.1–8.1)

## 2015-09-18 LAB — LIPID PANEL
CHOL/HDL RATIO: 1.8 ratio (ref <=5.0)
CHOLESTEROL: 139 mg/dL (ref 125–200)
HDL: 76 mg/dL (ref >=40)
LDL Cholesterol: 47 mg/dL (ref <130)
Triglycerides: 81 mg/dL (ref <150)
VLDL: 16 mg/dL (ref <30)

## 2015-09-18 LAB — POCT GLYCOSYLATED HEMOGLOBIN (HGB A1C): Hemoglobin A1C: 8.1

## 2015-09-18 NOTE — Patient Instructions (Signed)
2 Tylenol 4 times per day and if that doesn't work then you can take 2 Aleve twice per day with that

## 2015-09-18 NOTE — Progress Notes (Signed)
   Subjective:    Patient ID: Joe Barry, male    DOB: [birth year], 82 y.o.   MRN: [id number]  HPI  he is here for medication management visit as well as diabetes. He mainly complains of bilateral hand pain, right greater than left with some swelling. Difficult to say how long this is been going on. He also is had knee pain. He has had the left knee TKR but states he has not had follow-up with the surgeon. He continues on medications listed in the chart. They're really reviewed with him. Very difficult to get a good history from him as he was quite vague. His daughter has here with him.  Review of Systems     Objective:   Physical Exam Alert and in no distress. Foot exam done. Exam of the right hand does show some slight swelling on the palmar surface with lack of full extension of his fingers. Bilateral knee exam shows good motion however he did complain of pain.       Assessment & Plan:  Type 2 diabetes mellitus without complication, with long-term current use of insulin (HCC) - Plan: POCT glycosylated hemoglobin (Hb A1C)  Need for prophylactic vaccination against Streptococcus pneumoniae (pneumococcus) - Plan: Pneumococcal conjugate vaccine 13-valent  Arthritis associated with diabetes (HCC)  Hypertension associated with diabetes (HCC)  Hyperlipidemia LDL goal <70  Type 2 diabetes mellitus with both eyes affected by mild nonproliferative retinopathy without macular edema, with long-term current use of insulin (HCC) I discussed the hand pain as well as knee pain with him. Recommend Tylenol and then judicious use of an NSAID. If he continues have difficulty, he will call for referral to orthopedics and then possibly rheumatology.

## 2015-09-19 ENCOUNTER — Telehealth: Payer: Self-pay | Admitting: Internal Medicine

## 2015-09-19 NOTE — Telephone Encounter (Signed)
Pt left yesterday without getting prenvar 13 so i have called pt and see if he could come back in to get this. He will come in sometime and get this done

## 2015-10-01 ENCOUNTER — Other Ambulatory Visit: Payer: Self-pay | Admitting: Family Medicine

## 2015-11-25 ENCOUNTER — Encounter: Payer: Self-pay | Admitting: Family Medicine

## 2015-11-25 ENCOUNTER — Ambulatory Visit (INDEPENDENT_AMBULATORY_CARE_PROVIDER_SITE_OTHER): Payer: Medicare Other | Admitting: Family Medicine

## 2015-11-25 VITALS — BP 160/100 | HR 83 | Ht 65.0 in | Wt 193.0 lb

## 2015-11-25 DIAGNOSIS — J189 Pneumonia, unspecified organism: Secondary | ICD-10-CM

## 2015-11-25 DIAGNOSIS — Z23 Encounter for immunization: Secondary | ICD-10-CM

## 2015-11-25 NOTE — Progress Notes (Signed)
   Subjective:    Patient ID: Joe Barry, male    DOB: 09/08/1934, 82 y.o.   MRN: [id number]  HPI He is here for a recheck. He was seen on Feb 22nd in an outlying emergency room and treated for pneumonia. He was placed on azithromycin, a steroid inhaler as well as albuterol. At this time he is having no fever, chills, cough, congestion or shortness of breath.   Review of Systems     Objective:   Physical Exam Alert and in no distress. Tympanic membranes and canals are normal. Pharyngeal area is normal. Neck is supple without adenopathy or thyromegaly. Cardiac exam shows a regular sinus rhythm without murmurs or gallops. Lungs are clear to auscultation. Immunizations were also reviewed.       Assessment & Plan:  Need for prophylactic vaccination against Streptococcus pneumoniae (pneumococcus) - Plan: Pneumococcal conjugate vaccine 13-valent  Pneumonia, unspecified laterality, unspecified part of lung he is now asymptomatic and doing quite well. Recommend he stopped the inhalers completely. They will call if further difficulty otherwise I will see him in several months for follow-up on diabetes

## 2015-11-27 ENCOUNTER — Ambulatory Visit (INDEPENDENT_AMBULATORY_CARE_PROVIDER_SITE_OTHER): Payer: Medicare Other | Admitting: Ophthalmology

## 2015-11-27 DIAGNOSIS — H35341 Macular cyst, hole, or pseudohole, right eye: Secondary | ICD-10-CM | POA: Diagnosis not present

## 2015-11-27 DIAGNOSIS — H43813 Vitreous degeneration, bilateral: Secondary | ICD-10-CM

## 2015-11-27 DIAGNOSIS — I10 Essential (primary) hypertension: Secondary | ICD-10-CM | POA: Diagnosis not present

## 2015-11-27 DIAGNOSIS — H35033 Hypertensive retinopathy, bilateral: Secondary | ICD-10-CM | POA: Diagnosis not present

## 2015-11-27 DIAGNOSIS — E113292 Type 2 diabetes mellitus with mild nonproliferative diabetic retinopathy without macular edema, left eye: Secondary | ICD-10-CM | POA: Diagnosis not present

## 2015-11-27 DIAGNOSIS — E113211 Type 2 diabetes mellitus with mild nonproliferative diabetic retinopathy with macular edema, right eye: Secondary | ICD-10-CM

## 2015-11-27 DIAGNOSIS — E11311 Type 2 diabetes mellitus with unspecified diabetic retinopathy with macular edema: Secondary | ICD-10-CM

## 2015-12-17 ENCOUNTER — Other Ambulatory Visit: Payer: Self-pay | Admitting: Family Medicine

## 2016-03-16 ENCOUNTER — Telehealth: Payer: Self-pay | Admitting: Family Medicine

## 2016-03-16 NOTE — Telephone Encounter (Signed)
Left message with daughter. Pt needs cpe/annual wellness.

## 2016-03-21 ENCOUNTER — Other Ambulatory Visit: Payer: Self-pay | Admitting: Family Medicine

## 2016-04-03 ENCOUNTER — Encounter: Payer: Self-pay | Admitting: Family Medicine

## 2016-04-03 ENCOUNTER — Ambulatory Visit (INDEPENDENT_AMBULATORY_CARE_PROVIDER_SITE_OTHER): Payer: Medicare Other | Admitting: Family Medicine

## 2016-04-03 VITALS — BP 210/110 | HR 69 | Ht 64.0 in | Wt 210.0 lb

## 2016-04-03 DIAGNOSIS — R635 Abnormal weight gain: Secondary | ICD-10-CM

## 2016-04-03 DIAGNOSIS — R601 Generalized edema: Secondary | ICD-10-CM

## 2016-04-03 DIAGNOSIS — I10 Essential (primary) hypertension: Secondary | ICD-10-CM | POA: Diagnosis not present

## 2016-04-03 DIAGNOSIS — I739 Peripheral vascular disease, unspecified: Secondary | ICD-10-CM | POA: Diagnosis not present

## 2016-04-03 DIAGNOSIS — E1165 Type 2 diabetes mellitus with hyperglycemia: Secondary | ICD-10-CM

## 2016-04-03 DIAGNOSIS — E11618 Type 2 diabetes mellitus with other diabetic arthropathy: Secondary | ICD-10-CM

## 2016-04-03 DIAGNOSIS — Z794 Long term (current) use of insulin: Secondary | ICD-10-CM

## 2016-04-03 DIAGNOSIS — I152 Hypertension secondary to endocrine disorders: Secondary | ICD-10-CM

## 2016-04-03 DIAGNOSIS — B351 Tinea unguium: Secondary | ICD-10-CM | POA: Insufficient documentation

## 2016-04-03 DIAGNOSIS — IMO0002 Reserved for concepts with insufficient information to code with codable children: Secondary | ICD-10-CM

## 2016-04-03 DIAGNOSIS — E785 Hyperlipidemia, unspecified: Secondary | ICD-10-CM | POA: Diagnosis not present

## 2016-04-03 DIAGNOSIS — E1159 Type 2 diabetes mellitus with other circulatory complications: Secondary | ICD-10-CM

## 2016-04-03 DIAGNOSIS — E1169 Type 2 diabetes mellitus with other specified complication: Secondary | ICD-10-CM

## 2016-04-03 LAB — COMPREHENSIVE METABOLIC PANEL
ALK PHOS: 55 U/L (ref 40–115)
ALT: 7 U/L — AB (ref 9–46)
AST: 15 U/L (ref 10–35)
Albumin: 3.7 g/dL (ref 3.6–5.1)
BUN: 15 mg/dL (ref 7–25)
CO2: 27 mmol/L (ref 20–31)
Calcium: 9 mg/dL (ref 8.6–10.3)
Chloride: 106 mmol/L (ref 98–110)
Creat: 1.07 mg/dL (ref 0.70–1.11)
GLUCOSE: 46 mg/dL — AB (ref 65–99)
POTASSIUM: 4.3 mmol/L (ref 3.5–5.3)
Sodium: 141 mmol/L (ref 135–146)
Total Bilirubin: 0.4 mg/dL (ref 0.2–1.2)
Total Protein: 6.7 g/dL (ref 6.1–8.1)

## 2016-04-03 LAB — POCT GLYCOSYLATED HEMOGLOBIN (HGB A1C): Hemoglobin A1C: 10.3

## 2016-04-03 MED ORDER — AMLODIPINE BESYLATE 10 MG PO TABS: 10.0000 mg | ORAL_TABLET | Freq: Every day | ORAL | Status: DC

## 2016-04-03 NOTE — Progress Notes (Signed)
Subjective:   HPI  Joe Barry is a 82 y.o. male who presents for an annual wellness visit and follow-up.  Medical care team include   Preventative care: Last ophthalmology visit:4/17 Last dental visit:2016 Last colonoscopy:over 10 years Last prostate exam: [place] Last EKG:12/04/14 Last labs:09/17/15  Prior vaccinations: TD or Tdap:01/21/12 Influenza:05/14/15 Pneumococcal:23: 01/21/12  13: 11/25/15 Shingles/Zostavax: has rx  Other:   Advanced directive: Done Concerns: He has apparently had 2 episodes of hypoglycemia that his daughter is aware of. He does fix his own meals and apparently takes care of himself with little help from other people. He states he checked his blood sugars however there is a row question about how much she is really doing. He is also had a weight gain especially in his lower extremities. His daughter states that he is up walking around. He does not complain of chest pain, shortness of breath, DOE or PND. His medications were reviewed and are in the chart. He sees his eye doctor regularly. He has a history of PVD but presently is not complaining of anything other than arthritis pain. He continues to use his walker because of the knee pain. Reviewed their medical, surgical, family, social, medication, and allergy history and updated chart as appropriate.    Review of Systems Constitutional: -fever, -chills, -sweats, -unexpected weight change, -decreased appetite, -fatigue Allergy: -sneezing, -itching, -congestion Dermatology: -changing moles, --rash, -lumps ENT: -runny nose, -ear pain, -sore throat, -hoarseness, -sinus pain, -teeth pain, - ringing in ears, -hearing loss, -nosebleeds Cardiology: -chest pain, -palpitations, -swelling, -difficulty breathing when lying flat, -waking up short of breath Respiratory: -cough, -shortness of breath, -difficulty breathing with exercise or exertion, -wheezing, -coughing up blood Gastroenterology: -abdominal pain, -nausea,  -vomiting, -diarrhea, -constipation, -blood in stool, -changes in bowel movement, -difficulty swallowing or eating Hematology: -bleeding, -bruising  Musculoskeletal: -joint aches, -muscle aches, -joint swelling, -back pain, -neck pain, -cramping, -changes in gait Ophthalmology: denies vision changes, eye redness, itching, discharge Urology: -burning with urination, -difficulty urinating, -blood in urine, -urinary frequency, -urgency, -incontinence Neurology: -headache, -weakness, -tingling, -numbness, -memory loss, -falls, -dizziness Psychology: -depressed mood, -agitation, -sleep problems     Objective:   Physical Exam   General appearance: alert, no distress, WD/WN,  Skin:Normal except for 3+ pitting edema in his extremities. HEENT: normocephalic, conjunctiva/corneas normal, sclerae anicteric, PERRLA, EOMi, nares patent, no discharge or erythema, pharynx normal Oral cavity: MMM, tongue normal, teeth normal Neck: supple, no lymphadenopathy, no thyromegaly, no masses, normal ROM Chest: non tender, normal shape and expansion Heart: RRR, normal S1, S2, no murmurs Lungs: CTA bilaterally, no wheezes, rhonchi, or rales Abdomen: +bs, soft, non tender, non distended, no masses, no hepatomegaly, no splenomegaly, no bruits Musculoskeletal: upper extremities non tender, no obvious deformity, normal ROM throughout, lower extremities non tender, no obvious deformity, normal ROM throughout Extremities: 3+ edema, no cyanosis, no clubbing. Thickening of the toenails is noted. Pulses: 2+ symmetric, upper and lower extremities, normal cap refill Neurological: alert, oriented x 3, CN2-12 intact, strength normal upper extremities and lower extremities, sensation normal throughout, DTRs 2+ throughout, no cerebellar signs, gait normal Psychiatric: normal affect, behavior normal, pleasant  EKG shows no change from previous tracing.  Assessment and Plan :   Arthritis associated with diabetes  (HCC)  Hypertension associated with diabetes (HCC) - Plan: amLODipine (NORVASC) 10 MG tablet, EKG 12-Lead, DG Chest 2 View, Comprehensive metabolic panel, Brain natriuretic peptide, CANCELED: Brain natriuretic peptide  Hyperlipidemia associated with type 2 diabetes mellitus (HCC)  PVD (peripheral vascular disease) (  HCC) - Plan: Brain natriuretic peptide  Uncontrolled type 2 diabetes mellitus with other specified complication, with long-term current use of insulin (HCC) - Plan: POCT glycosylated hemoglobin (Hb A1C), EKG 12-Lead, DG Chest 2 View, Comprehensive metabolic panel, CANCELED: Brain natriuretic peptide  Weight gain - Plan: EKG 12-Lead, DG Chest 2 View, Comprehensive metabolic panel, CANCELED: Brain natriuretic peptide  Onychomycosis This is a difficult situation. I do not oh comfortable increasing his insulin since he has had 2 hypoglycemic episodes. There is a real concern about his eating habits and taking care of himself. His daughter would like [place] to get home health involved several times per week and she and her girlfriend can help with care in between. I will increase his blood pressure medication by adding amlodipine. No therapy for the onychomycosis. We'll continue to evaluate the weight gain to look at cardiac although he has no symptoms concerning that.    Physical exam - discussed healthy lifestyle, diet, exercise, preventative care, vaccinations, and addressed their concerns.   Follow-up 4 months. His daughter will also call me in approximately one month and let me know how his blood sugars are doing as well as blood pressure.

## 2016-04-03 NOTE — Patient Instructions (Signed)
Call with blood pressure readings in about a month and also blood sugar readings. Check the blood sugars before a meal or 2 hours after some your meals

## 2016-04-04 LAB — BRAIN NATRIURETIC PEPTIDE: Brain Natriuretic Peptide: 141.3 pg/mL — ABNORMAL HIGH (ref <100)

## 2016-04-06 ENCOUNTER — Other Ambulatory Visit: Payer: Self-pay | Admitting: Family Medicine

## 2016-04-06 DIAGNOSIS — R635 Abnormal weight gain: Secondary | ICD-10-CM

## 2016-04-06 MED ORDER — FUROSEMIDE 20 MG PO TABS: 20.0000 mg | ORAL_TABLET | Freq: Every day | ORAL | 3 refills | Status: DC

## 2016-04-06 NOTE — Progress Notes (Signed)
He does have an elevated BNP. This was discussed with Dr. Donnie Aho. Clinically he does not have symptoms of CHF but does have weight gain. I will give him Lasix and follow-up with an echocardiogram. He will return here in one week for follow-up.

## 2016-04-08 ENCOUNTER — Telehealth: Payer: Self-pay

## 2016-04-08 NOTE — Telephone Encounter (Signed)
Spoke with Sherril Croon and he needed verbal order for SN for eval of DM management teaching and to see what resources are needed in the home for pt. Trixie Rude

## 2016-04-30 ENCOUNTER — Ambulatory Visit
Admission: RE | Admit: 2016-04-30 | Discharge: 2016-04-30 | Disposition: A | Discharge status: Home / Self Care | Payer: Medicare Other | Source: Ambulatory Visit | Attending: Family Medicine | Admitting: Family Medicine

## 2016-04-30 ENCOUNTER — Other Ambulatory Visit: Payer: Self-pay

## 2016-04-30 ENCOUNTER — Ambulatory Visit (HOSPITAL_COMMUNITY): Payer: Medicare Other | Attending: Cardiovascular Disease

## 2016-04-30 DIAGNOSIS — Z794 Long term (current) use of insulin: Secondary | ICD-10-CM

## 2016-04-30 DIAGNOSIS — E669 Obesity, unspecified: Secondary | ICD-10-CM | POA: Diagnosis not present

## 2016-04-30 DIAGNOSIS — E785 Hyperlipidemia, unspecified: Secondary | ICD-10-CM | POA: Insufficient documentation

## 2016-04-30 DIAGNOSIS — E1159 Type 2 diabetes mellitus with other circulatory complications: Secondary | ICD-10-CM

## 2016-04-30 DIAGNOSIS — E1169 Type 2 diabetes mellitus with other specified complication: Secondary | ICD-10-CM

## 2016-04-30 DIAGNOSIS — I119 Hypertensive heart disease without heart failure: Secondary | ICD-10-CM | POA: Insufficient documentation

## 2016-04-30 DIAGNOSIS — IMO0002 Reserved for concepts with insufficient information to code with codable children: Secondary | ICD-10-CM

## 2016-04-30 DIAGNOSIS — I10 Essential (primary) hypertension: Principal | ICD-10-CM

## 2016-04-30 DIAGNOSIS — Z6836 Body mass index (BMI) 36.0-36.9, adult: Secondary | ICD-10-CM | POA: Diagnosis not present

## 2016-04-30 DIAGNOSIS — E1165 Type 2 diabetes mellitus with hyperglycemia: Secondary | ICD-10-CM

## 2016-04-30 DIAGNOSIS — R635 Abnormal weight gain: Secondary | ICD-10-CM

## 2016-04-30 DIAGNOSIS — E119 Type 2 diabetes mellitus without complications: Secondary | ICD-10-CM | POA: Diagnosis not present

## 2016-04-30 NOTE — Progress Notes (Deleted)
The echocardiogram is normal. Chest x-ray is also normal. No evidence of heart failure.

## 2016-05-13 ENCOUNTER — Other Ambulatory Visit: Payer: Self-pay | Admitting: Family Medicine

## 2016-06-03 ENCOUNTER — Ambulatory Visit (INDEPENDENT_AMBULATORY_CARE_PROVIDER_SITE_OTHER): Payer: Medicare Other | Admitting: Podiatry

## 2016-06-03 ENCOUNTER — Encounter: Payer: Self-pay | Admitting: Podiatry

## 2016-06-03 VITALS — BP 169/83 | HR 103 | Resp 18

## 2016-06-03 DIAGNOSIS — M79675 Pain in left toe(s): Secondary | ICD-10-CM

## 2016-06-03 DIAGNOSIS — M79674 Pain in right toe(s): Secondary | ICD-10-CM | POA: Diagnosis not present

## 2016-06-03 DIAGNOSIS — B351 Tinea unguium: Secondary | ICD-10-CM | POA: Diagnosis not present

## 2016-06-03 NOTE — Patient Instructions (Signed)
Diabetes and Foot Care Diabetes may cause you to have problems because of poor blood supply (circulation) to your feet and legs. This may cause the skin on your feet to become thinner, break easier, and heal more slowly. Your skin may become dry, and the skin may peel and crack. You may also have nerve damage in your legs and feet causing decreased feeling in them. You may not notice minor injuries to your feet that could lead to infections or more serious problems. Taking care of your feet is one of the most important things you can do for yourself.  HOME CARE INSTRUCTIONS  Wear shoes at all times, even in the house. Do not go barefoot. Bare feet are easily injured.  Check your feet daily for blisters, cuts, and redness. If you cannot see the bottom of your feet, use a mirror or ask someone for help.  Wash your feet with warm water (do not use hot water) and mild soap. Then pat your feet and the areas between your toes until they are completely dry. Do not soak your feet as this can dry your skin.  Apply a moisturizing lotion or petroleum jelly (that does not contain alcohol and is unscented) to the skin on your feet and to dry, brittle toenails. Do not apply lotion between your toes.  Trim your toenails straight across. Do not dig under them or around the cuticle. File the edges of your nails with an emery board or nail file.  Do not cut corns or calluses or try to remove them with medicine.  Wear clean socks or stockings every day. Make sure they are not too tight. Do not wear knee-high stockings since they may decrease blood flow to your legs.  Wear shoes that fit properly and have enough cushioning. To break in new shoes, wear them for just a few hours a day. This prevents you from injuring your feet. Always look in your shoes before you put them on to be sure there are no objects inside.  Do not cross your legs. This may decrease the blood flow to your feet.  If you find a minor scrape,  cut, or break in the skin on your feet, keep it and the skin around it clean and dry. These areas may be cleansed with mild soap and water. Do not cleanse the area with peroxide, alcohol, or iodine.  When you remove an adhesive bandage, be sure not to damage the skin around it.  If you have a wound, look at it several times a day to make sure it is healing.  Do not use heating pads or hot water bottles. They may burn your skin. If you have lost feeling in your feet or legs, you may not know it is happening until it is too late.  Make sure your health care provider performs a complete foot exam at least annually or more often if you have foot problems. Report any cuts, sores, or bruises to your health care provider immediately. SEEK MEDICAL CARE IF:   You have an injury that is not healing.  You have cuts or breaks in the skin.  You have an ingrown nail.  You notice redness on your legs or feet.  You feel burning or tingling in your legs or feet.  You have pain or cramps in your legs and feet.  Your legs or feet are numb.  Your feet always feel cold. SEEK IMMEDIATE MEDICAL CARE IF:   There is increasing redness,   swelling, or pain in or around a wound.  There is a red line that goes up your leg.  Pus is coming from a wound.  You develop a fever or as directed by your health care provider.  You notice a bad smell coming from an ulcer or wound.   This information is not intended to replace advice given to you by your health care provider. Make sure you discuss any questions you have with your health care provider.   Document Released: 08/28/2000 Document Revised: 05/03/2013 Document Reviewed: 02/07/2013 Elsevier Interactive Patient Education 2016 [employer]  

## 2016-06-03 NOTE — Progress Notes (Signed)
   Subjective:    Patient ID: Joe BarcelonaJohn R Devries, male    DOB: [birth year], 82 y.o.   MRN: [id number]  HPI     This patient presents today with his daughter present in the treatment room requesting diabetic foot examination as well as trimming of his thickened toenails or cough walking wearing shoes. The toenails are becoming progressively more uncomfortable a specimen in the last 4-6 months the patient to patient's daughter attempted to trim the toenails until the nails he came deformed and thickened and they were unable to trim the nails Patient was last seen for this similar service on 07/05/2013 he did not present until today for follow-up. He denies any history of foot ulceration, claudication or amputation The patient and daughter requesting diabetic shoes  Review of Systems  All other systems reviewed and are negative.      Objective:   Physical Exam  Orientated 3  Vascular: DP pulses 2/4 bilaterally PT pulses 1/4 bilaterally Capillary reflex immediate bilaterally  Neurological: Sensation to 10 g monofilament wire intact 5/5 bilaterally Vibratory sensation nonreactive bilaterally Ankle reflex reactive bilaterally  Dermatological: No open skin lesions bilaterally The toenails extremely elongated, hypertrophic, deformed, discolored and tender to palpation 6-10  Musculoskeletal: HAV left Patient walks slowly with walker Dorsi flexion, plantar flexion 5/5 bilaterally      Assessment & Plan:   Assessment: Diabetic with a history of fall vascular disease Symptomatic onychomycoses 6-10  Plan: Debridement of toenails 6-10 mechanically and legs without a bleeding Obtain certification for diabetic shoes Indication for shoes Diabetic with history of peripheral vascular disease HAV  Reappoint 3 months

## 2016-06-24 ENCOUNTER — Encounter: Payer: Self-pay | Admitting: Family Medicine

## 2016-06-24 ENCOUNTER — Ambulatory Visit (INDEPENDENT_AMBULATORY_CARE_PROVIDER_SITE_OTHER): Payer: Medicare Other | Admitting: Family Medicine

## 2016-06-24 ENCOUNTER — Other Ambulatory Visit: Payer: Self-pay | Admitting: Family Medicine

## 2016-06-24 VITALS — BP 126/80 | HR 86 | Temp 98.7°F | Wt 196.0 lb

## 2016-06-24 DIAGNOSIS — J209 Acute bronchitis, unspecified: Secondary | ICD-10-CM | POA: Diagnosis not present

## 2016-06-24 MED ORDER — AMOXICILLIN 875 MG PO TABS
875.0000 mg | ORAL_TABLET | Freq: Two times a day (BID) | ORAL | 0 refills | Status: DC
Start: 2016-06-24 — End: 2016-10-27

## 2016-06-24 NOTE — Patient Instructions (Signed)
All the antibiotic and call me if not totally back to normal when you finish

## 2016-06-24 NOTE — Progress Notes (Signed)
   Subjective:    Patient ID: Joe BarcelonaJohn R Barry, male    DOB: [birth year], 82 y.o.   MRN: [id number]  HPI He complains of a two-week history this started with sore throat followed by rhinorrhea, chest congestion, coughing.  No fever, chills, earache, shortness of breath.  Review of Systems     Objective:   Physical Exam Alert and in no distress. Tympanic membranes and canals are normal. Pharyngeal area is normal. Neck is supple without adenopathy or thyromegaly. Cardiac exam shows a regular sinus rhythm without murmurs or gallops. Lungs are clear to auscultation.        Assessment & Plan:  Acute bronchitis, unspecified organism - Plan: amoxicillin (AMOXIL) 875 MG tablet He is to take all the antibiotic and call me if not entirely better when he finishes.

## 2016-07-08 ENCOUNTER — Other Ambulatory Visit: Payer: Self-pay | Admitting: Family Medicine

## 2016-07-21 ENCOUNTER — Other Ambulatory Visit: Payer: Self-pay | Admitting: Family Medicine

## 2016-08-05 ENCOUNTER — Other Ambulatory Visit: Payer: Self-pay | Admitting: Family Medicine

## 2016-08-10 ENCOUNTER — Other Ambulatory Visit: Payer: Self-pay | Admitting: Family Medicine

## 2016-08-10 ENCOUNTER — Ambulatory Visit: Payer: Medicare Other | Admitting: Family Medicine

## 2016-08-10 NOTE — Telephone Encounter (Signed)
Is this okay to refill? 

## 2016-08-17 ENCOUNTER — Other Ambulatory Visit: Payer: Medicare Other

## 2016-08-27 ENCOUNTER — Other Ambulatory Visit: Payer: Self-pay | Admitting: Family Medicine

## 2016-09-02 ENCOUNTER — Ambulatory Visit (INDEPENDENT_AMBULATORY_CARE_PROVIDER_SITE_OTHER): Payer: Medicare Other | Admitting: Podiatry

## 2016-09-02 ENCOUNTER — Encounter: Payer: Self-pay | Admitting: Podiatry

## 2016-09-02 VITALS — BP 101/70 | HR 83 | Resp 18

## 2016-09-02 DIAGNOSIS — B351 Tinea unguium: Secondary | ICD-10-CM

## 2016-09-02 DIAGNOSIS — M79674 Pain in right toe(s): Secondary | ICD-10-CM

## 2016-09-02 DIAGNOSIS — M79675 Pain in left toe(s): Secondary | ICD-10-CM | POA: Diagnosis not present

## 2016-09-02 NOTE — Patient Instructions (Signed)

## 2016-09-03 NOTE — Progress Notes (Signed)
Patient ID: Joe Barry, male   DOB: Jan 05, 1934, 82 y.o.   MRN: [id number]    Subjective: This patient presents today with his daughter present in the treatment room requesting diabetic foot examination as well as trimming of his thickened toenails that are uncomfortable walking and wearing shoes.  Patient is diabetic and denies history of foot ulceration, claudication or amputation  Objective: Orientated 3  Vascular: DP pulses 2/4 bilaterally PT pulses 1/4 bilaterally Capillary reflex immediate bilaterally  Neurological: Sensation to 10 g monofilament wire intact 5/5 bilaterally Vibratory sensation nonreactive bilaterally Ankle reflex reactive bilaterally  Dermatological: No open skin lesions bilaterally The toenails extremely elongated, hypertrophic, deformed, discolored and tender to palpation 6-10  Musculoskeletal: HAV left Patient walks slowly with walker Dorsi flexion, plantar flexion 5/5 bilaterally  Assessment: Diabetic with a history of fall vascular disease Symptomatic onychomycoses 6-10  Plan: Debridement of toenails 6-10 mechanically and legs without a bleeding Obtain certification for diabetic shoes  Indication for shoes Diabetic with history of peripheral vascular disease HAV,  pending  Reappoint 3 months

## 2016-10-02 ENCOUNTER — Telehealth: Payer: Self-pay | Admitting: Podiatry

## 2016-10-02 NOTE — Telephone Encounter (Signed)
pts daughter (Erica)called about pts diabetic shoes he needs to pick them up. I explained to her that it expires 1.21.18 and that we would not be able to get pt in to see Dr Leeanne Deeduchman prior to that so we have to restart the paperwork. Could you please call pts daughter and explain the process I am not sure what it involves.

## 2016-10-12 ENCOUNTER — Other Ambulatory Visit: Payer: Self-pay | Admitting: Family Medicine

## 2016-10-21 ENCOUNTER — Other Ambulatory Visit: Payer: Self-pay | Admitting: Family Medicine

## 2016-10-22 NOTE — Telephone Encounter (Signed)
Is this okay to refill? 

## 2016-10-27 ENCOUNTER — Ambulatory Visit (INDEPENDENT_AMBULATORY_CARE_PROVIDER_SITE_OTHER): Payer: Medicare Other | Admitting: Family Medicine

## 2016-10-27 ENCOUNTER — Encounter: Payer: Self-pay | Admitting: Family Medicine

## 2016-10-27 VITALS — BP 128/74 | HR 67 | Ht 64.0 in | Wt 197.0 lb

## 2016-10-27 DIAGNOSIS — E11618 Type 2 diabetes mellitus with other diabetic arthropathy: Secondary | ICD-10-CM | POA: Diagnosis not present

## 2016-10-27 DIAGNOSIS — E1159 Type 2 diabetes mellitus with other circulatory complications: Secondary | ICD-10-CM

## 2016-10-27 DIAGNOSIS — I10 Essential (primary) hypertension: Secondary | ICD-10-CM | POA: Diagnosis not present

## 2016-10-27 DIAGNOSIS — Z23 Encounter for immunization: Secondary | ICD-10-CM | POA: Diagnosis not present

## 2016-10-27 DIAGNOSIS — E785 Hyperlipidemia, unspecified: Secondary | ICD-10-CM | POA: Diagnosis not present

## 2016-10-27 DIAGNOSIS — E1169 Type 2 diabetes mellitus with other specified complication: Secondary | ICD-10-CM | POA: Diagnosis not present

## 2016-10-27 DIAGNOSIS — M545 Low back pain, unspecified: Secondary | ICD-10-CM

## 2016-10-27 DIAGNOSIS — E119 Type 2 diabetes mellitus without complications: Secondary | ICD-10-CM | POA: Diagnosis not present

## 2016-10-27 DIAGNOSIS — I152 Hypertension secondary to endocrine disorders: Secondary | ICD-10-CM

## 2016-10-27 LAB — POCT GLYCOSYLATED HEMOGLOBIN (HGB A1C): Hemoglobin A1C: 7.5

## 2016-10-27 NOTE — Progress Notes (Signed)
  Subjective:    Patient ID: Antonietta BarcelonaJohn R Birdsell, male    DOB: 1934/02/02, 83 y.o.   MRN: [id number]  Antonietta BarcelonaJohn R Marquina is a 83 y.o. male who presents for follow-up of Type 2 diabetes mellitus.  Patient is checking home blood sugars.   Home blood sugar records:40 to 200 How often is blood sugars being checked: once a day Current symptoms/problems none Daily foot checks: yes   Any foot concerns none Last eye exam: 11/2016  Exercise: house work brings wood in He is again complaining of knee and back pain. The pain is worse in the last several weeks but no history of injury. He is here with his daughter who is helping with his care. He continues on his Lantus. He also continues on Actos Taking lovastatin for his lipids.  The following portions of the patient's history were reviewed and updated as appropriate: allergies, current medications, past medical history, past social history and problem list.  ROS as in subjective above.     Objective:    Physical Exam Alert and in no distress Limited motion of his back but no tenderness to palpation..   Lab Review Diabetic Labs Latest Ref Rng & Units 04/03/2016 09/18/2015 09/17/2015 11/27/2014 05/09/2014  HbA1c - 10.3 8.1 - - 9.3%  Microalbumin mg/dL - - - - -  Micro/Creat Ratio - - - - - -  Chol 125 - 200 mg/dL - - 119139 - -  HDL >=14>=40 mg/dL - - 76 - -  Calc LDL <782<130 mg/dL - - 47 - -  Triglycerides <150 mg/dL - - 81 - -  Creatinine 0.70 - 1.11 mg/dL 9.561.07 - 2.131.03 0.860.84 -   BP/Weight 09/02/2016 06/24/2016 06/03/2016 04/03/2016 11/25/2015  Systolic BP 101 126 169 210 160  Diastolic BP 70 80 83 110 100  Wt. (Lbs) - 196 - 210 193  BMI - 33.64 - 36.03 32.12   Foot/eye exam completion dates Latest Ref Rng & Units 09/18/2015 07/30/2015  Eye Exam No Retinopathy - Retinopathy(A)  Foot Form Completion - Done -   A1c is 7.5 Yago  reports that he quit smoking about 44 years ago. His smoking use included Cigarettes. He has never used smokeless tobacco. He reports that he  drinks about 1.8 oz of alcohol per week . He reports that he does not use drugs.     Assessment & Plan:     1. Rx changes: none 2. Education: Reviewed 'ABCs' of diabetes management (respective goals in parentheses):  A1C (<7), blood pressure (<130/80), and cholesterol (LDL <100). 3. Compliance at present is estimated to be fair. Efforts to improve compliance (if necessary) will be directed at increased exercise. 4. Follow up: 4 months X-rays were ordered although he was not interested in getting them. He will see how he does on pain medication and then get them if he still is having trouble.

## 2016-11-27 ENCOUNTER — Other Ambulatory Visit: Payer: Self-pay | Admitting: Family Medicine

## 2016-12-02 ENCOUNTER — Ambulatory Visit (INDEPENDENT_AMBULATORY_CARE_PROVIDER_SITE_OTHER): Payer: Medicare Other | Admitting: Ophthalmology

## 2016-12-09 ENCOUNTER — Ambulatory Visit: Payer: Medicare Other | Admitting: Podiatry

## 2016-12-23 ENCOUNTER — Ambulatory Visit (INDEPENDENT_AMBULATORY_CARE_PROVIDER_SITE_OTHER): Payer: Medicare Other | Admitting: Podiatry

## 2016-12-23 DIAGNOSIS — M79675 Pain in left toe(s): Secondary | ICD-10-CM

## 2016-12-23 DIAGNOSIS — I739 Peripheral vascular disease, unspecified: Secondary | ICD-10-CM

## 2016-12-23 DIAGNOSIS — B351 Tinea unguium: Secondary | ICD-10-CM | POA: Diagnosis not present

## 2016-12-23 DIAGNOSIS — M79674 Pain in right toe(s): Secondary | ICD-10-CM | POA: Diagnosis not present

## 2016-12-23 NOTE — Progress Notes (Signed)
Patient ID: Joe Barry, male   DOB: 16-Apr-1934, 83 y.o.   MRN: [id number]   Subjective: This patient presents today with his daughter present in the treatment room requesting diabetic foot examination as well as trimming of his thickened toenails or cough walking wearing shoes. The toenails are becoming progressively more uncomfortable a specimen in the last 4-6 months the patient to patient's daughter attempted to trim the toenails until the nails he came deformed and thickened and they were unable to trim the nails The patient and daughter requesting diabetic shoes   Objective:  Orientated 3  Vascular: ] Pitting edema bilaterally DP pulses 2/4 bilaterally PT pulses 1/4 bilaterally Capillary reflex immediate bilaterally  Neurological: Sensation to 10 g monofilament wire intact 5/5 bilaterally Vibratory sensation nonreactive bilaterally Ankle reflex reactive bilaterally  Dermatological: No open skin lesions bilaterally Atrophic skin without hair growth bilaterally The toenails extremely elongated, hypertrophic, deformed, discolored and tender to palpation 6-10  Musculoskeletal: HAV left Patient walks slowly with walker Dorsi flexion, plantar flexion 5/5 bilaterally  Assessment: Diabetic with decreased pulses, peripheral arterial disease HAV deformity Above indication for diabetic shoes  Debridement of toenails 6-10 mechanically an electrical without any bleeding  Reappoint 3 months for nail debridement and notify patient upon receipt of diabetic shoes with foam impression and certification pending

## 2016-12-23 NOTE — Patient Instructions (Signed)
Removed Band-Aids on fourth toes right and left and fifth left toe in 1-3 days and apply triple antibiotic ointment daily to these areas until a scab forms The office will contact you upon receipt of diabetic shoes  Diabetes and Foot Care Diabetes may cause you to have problems because of poor blood supply (circulation) to your feet and legs. This may cause the skin on your feet to become thinner, break easier, and heal more slowly. Your skin may become dry, and the skin may peel and crack. You may also have nerve damage in your legs and feet causing decreased feeling in them. You may not notice minor injuries to your feet that could lead to infections or more serious problems. Taking care of your feet is one of the most important things you can do for yourself. Follow these instructions at home:  Wear shoes at all times, even in the house. Do not go barefoot. Bare feet are easily injured.  Check your feet daily for blisters, cuts, and redness. If you cannot see the bottom of your feet, use a mirror or ask someone for help.  Wash your feet with warm water (do not use hot water) and mild soap. Then pat your feet and the areas between your toes until they are completely dry. Do not soak your feet as this can dry your skin.  Apply a moisturizing lotion or petroleum jelly (that does not contain alcohol and is unscented) to the skin on your feet and to dry, brittle toenails. Do not apply lotion between your toes.  Trim your toenails straight across. Do not dig under them or around the cuticle. File the edges of your nails with an emery board or nail file.  Do not cut corns or calluses or try to remove them with medicine.  Wear clean socks or stockings every day. Make sure they are not too tight. Do not wear knee-high stockings since they may decrease blood flow to your legs.  Wear shoes that fit properly and have enough cushioning. To break in new shoes, wear them for just a few hours a day. This  prevents you from injuring your feet. Always look in your shoes before you put them on to be sure there are no objects inside.  Do not cross your legs. This may decrease the blood flow to your feet.  If you find a minor scrape, cut, or break in the skin on your feet, keep it and the skin around it clean and dry. These areas may be cleansed with mild soap and water. Do not cleanse the area with peroxide, alcohol, or iodine.  When you remove an adhesive bandage, be sure not to damage the skin around it.  If you have a wound, look at it several times a day to make sure it is healing.  Do not use heating pads or hot water bottles. They may burn your skin. If you have lost feeling in your feet or legs, you may not know it is happening until it is too late.  Make sure your health care provider performs a complete foot exam at least annually or more often if you have foot problems. Report any cuts, sores, or bruises to your health care provider immediately. Contact a health care provider if:  You have an injury that is not healing.  You have cuts or breaks in the skin.  You have an ingrown nail.  You notice redness on your legs or feet.  You feel burning or  tingling in your legs or feet.  You have pain or cramps in your legs and feet.  Your legs or feet are numb.  Your feet always feel cold. Get help right away if:  There is increasing redness, swelling, or pain in or around a wound.  There is a red line that goes up your leg.  Pus is coming from a wound.  You develop a fever or as directed by your health care provider.  You notice a bad smell coming from an ulcer or wound. This information is not intended to replace advice given to you by your health care provider. Make sure you discuss any questions you have with your health care provider. Document Released: 08/28/2000 Document Revised: 02/06/2016 Document Reviewed: 02/07/2013 Elsevier Interactive Patient Education  2017  [employer].

## 2017-01-05 ENCOUNTER — Ambulatory Visit (INDEPENDENT_AMBULATORY_CARE_PROVIDER_SITE_OTHER): Payer: Medicare Other | Admitting: Ophthalmology

## 2017-01-12 ENCOUNTER — Other Ambulatory Visit: Payer: Self-pay

## 2017-01-12 ENCOUNTER — Telehealth: Payer: Self-pay | Admitting: Family Medicine

## 2017-01-12 NOTE — Telephone Encounter (Signed)
Erica left message wants to know if he can get a wheel chair, states he is having difficulty walking, he has [facility] Medicare and wants to know how they go about getting one?

## 2017-01-12 NOTE — Telephone Encounter (Signed)
We probably need a PT/OT evaluation for need for wheelchair

## 2017-01-12 NOTE — Telephone Encounter (Signed)
I have talked to [place] and we will talk again about this on Thursday

## 2017-01-18 ENCOUNTER — Other Ambulatory Visit: Payer: Self-pay

## 2017-01-18 NOTE — Telephone Encounter (Signed)
Joe Barry agreed for [facility] to eval pt for wheel chair sent in order to [facility]

## 2017-01-19 ENCOUNTER — Other Ambulatory Visit: Payer: Self-pay

## 2017-01-19 DIAGNOSIS — R262 Difficulty in walking, not elsewhere classified: Secondary | ICD-10-CM

## 2017-01-24 ENCOUNTER — Other Ambulatory Visit: Payer: Self-pay | Admitting: Family Medicine

## 2017-02-02 ENCOUNTER — Telehealth: Payer: Self-pay | Admitting: *Deleted

## 2017-02-02 NOTE — Telephone Encounter (Signed)
Left message for patient to call and schedule an appointment to pick up diabetic shoes. 

## 2017-02-18 ENCOUNTER — Other Ambulatory Visit: Payer: Self-pay

## 2017-02-18 ENCOUNTER — Ambulatory Visit (INDEPENDENT_AMBULATORY_CARE_PROVIDER_SITE_OTHER): Payer: Medicare Other | Admitting: Podiatry

## 2017-02-18 DIAGNOSIS — I739 Peripheral vascular disease, unspecified: Secondary | ICD-10-CM | POA: Diagnosis not present

## 2017-02-18 DIAGNOSIS — E1140 Type 2 diabetes mellitus with diabetic neuropathy, unspecified: Secondary | ICD-10-CM

## 2017-02-18 DIAGNOSIS — M2010 Hallux valgus (acquired), unspecified foot: Secondary | ICD-10-CM

## 2017-03-02 NOTE — Progress Notes (Signed)
Patient came in today to pick up diabetic shoes and custom inserts.  Same was well pleased with fit and function.   The foot ortheses offered full contact with plantar surface and contoured the arch well.   The shoes fit well with no heel slippage and areas of pressure concern.   Patient advised to contact us if any problems arise.  Shoe  Apex [id number] 9W.Marland Kitchen.Marland Kitchen.plus 3 [phone]

## 2017-03-09 ENCOUNTER — Other Ambulatory Visit: Payer: Self-pay | Admitting: Family Medicine

## 2017-03-24 ENCOUNTER — Ambulatory Visit: Payer: Medicare Other | Admitting: Podiatry

## 2017-05-05 ENCOUNTER — Other Ambulatory Visit: Payer: Self-pay | Admitting: Family Medicine

## 2017-05-20 ENCOUNTER — Other Ambulatory Visit: Payer: Self-pay | Admitting: Family Medicine

## 2017-05-24 ENCOUNTER — Other Ambulatory Visit: Payer: Self-pay | Admitting: Family Medicine

## 2017-05-24 DIAGNOSIS — I10 Essential (primary) hypertension: Principal | ICD-10-CM

## 2017-05-24 DIAGNOSIS — I152 Hypertension secondary to endocrine disorders: Secondary | ICD-10-CM

## 2017-05-24 DIAGNOSIS — E1159 Type 2 diabetes mellitus with other circulatory complications: Secondary | ICD-10-CM

## 2017-05-25 NOTE — Telephone Encounter (Signed)
Called and spoke to patient's daughter and she has scheduled him for an med check/ DM on October 1. I will refill med for 30 days

## 2017-06-14 ENCOUNTER — Encounter: Payer: Medicare Other | Admitting: Family Medicine

## 2017-06-17 ENCOUNTER — Encounter: Payer: Self-pay | Admitting: Family Medicine

## 2017-06-17 ENCOUNTER — Ambulatory Visit (INDEPENDENT_AMBULATORY_CARE_PROVIDER_SITE_OTHER): Payer: Medicare Other | Admitting: Family Medicine

## 2017-06-17 VITALS — BP 130/70 | HR 84 | Ht 64.0 in | Wt 200.0 lb

## 2017-06-17 DIAGNOSIS — B351 Tinea unguium: Secondary | ICD-10-CM

## 2017-06-17 DIAGNOSIS — I739 Peripheral vascular disease, unspecified: Secondary | ICD-10-CM

## 2017-06-17 DIAGNOSIS — I10 Essential (primary) hypertension: Secondary | ICD-10-CM | POA: Diagnosis not present

## 2017-06-17 DIAGNOSIS — E785 Hyperlipidemia, unspecified: Secondary | ICD-10-CM

## 2017-06-17 DIAGNOSIS — E1159 Type 2 diabetes mellitus with other circulatory complications: Secondary | ICD-10-CM

## 2017-06-17 DIAGNOSIS — Z23 Encounter for immunization: Secondary | ICD-10-CM

## 2017-06-17 DIAGNOSIS — E1169 Type 2 diabetes mellitus with other specified complication: Secondary | ICD-10-CM | POA: Diagnosis not present

## 2017-06-17 DIAGNOSIS — E119 Type 2 diabetes mellitus without complications: Secondary | ICD-10-CM

## 2017-06-17 DIAGNOSIS — E11618 Type 2 diabetes mellitus with other diabetic arthropathy: Secondary | ICD-10-CM

## 2017-06-17 LAB — COMPREHENSIVE METABOLIC PANEL
AG Ratio: 1.4 (calc) (ref 1.0–2.5)
ALKALINE PHOSPHATASE (APISO): 48 U/L (ref 40–115)
ALT: 9 U/L (ref 9–46)
AST: 13 U/L (ref 10–35)
Albumin: 4.2 g/dL (ref 3.6–5.1)
BILIRUBIN TOTAL: 0.3 mg/dL (ref 0.2–1.2)
BUN/Creatinine Ratio: 33 (calc) — ABNORMAL HIGH (ref 6–22)
BUN: 51 mg/dL — AB (ref 7–25)
CALCIUM: 9.5 mg/dL (ref 8.6–10.3)
CO2: 19 mmol/L — ABNORMAL LOW (ref 20–32)
CREATININE: 1.53 mg/dL — AB (ref 0.70–1.11)
Chloride: 103 mmol/L (ref 98–110)
Globulin: 3.1 g/dL (calc) (ref 1.9–3.7)
Glucose, Bld: 147 mg/dL — ABNORMAL HIGH (ref 65–99)
Potassium: 4.4 mmol/L (ref 3.5–5.3)
Sodium: 138 mmol/L (ref 135–146)
Total Protein: 7.3 g/dL (ref 6.1–8.1)

## 2017-06-17 LAB — CBC WITH DIFFERENTIAL/PLATELET
BASOS PCT: 0.6 %
Basophils Absolute: 32 cells/uL (ref 0–200)
Eosinophils Absolute: 122 cells/uL (ref 15–500)
Eosinophils Relative: 2.3 %
HCT: 36.1 % — ABNORMAL LOW (ref 38.5–50.0)
Hemoglobin: 12 g/dL — ABNORMAL LOW (ref 13.2–17.1)
LYMPHS ABS: 1781 {cells}/uL (ref 850–3900)
MCH: 30.2 pg (ref 27.0–33.0)
MCHC: 33.2 g/dL (ref 32.0–36.0)
MCV: 90.9 fL (ref 80.0–100.0)
MPV: 11.3 fL (ref 7.5–12.5)
Monocytes Relative: 11.1 %
Neutro Abs: 2777 cells/uL (ref 1500–7800)
Neutrophils Relative %: 52.4 %
PLATELETS: 247 10*3/uL (ref 140–400)
RBC: 3.97 10*6/uL — AB (ref 4.20–5.80)
RDW: 13.6 % (ref 11.0–15.0)
TOTAL LYMPHOCYTE: 33.6 %
WBC: 5.3 10*3/uL (ref 3.8–10.8)
WBCMIX: 588 {cells}/uL (ref 200–950)

## 2017-06-17 LAB — LIPID PANEL
CHOL/HDL RATIO: 1.6 (calc) (ref <5.0)
Cholesterol: 154 mg/dL (ref <200)
HDL: 96 mg/dL (ref >40)
LDL CHOLESTEROL (CALC): 38 mg/dL
Non-HDL Cholesterol (Calc): 58 mg/dL (calc) (ref <130)
Triglycerides: 112 mg/dL (ref <150)

## 2017-06-17 LAB — POCT GLYCOSYLATED HEMOGLOBIN (HGB A1C): Hemoglobin A1C: 7.2

## 2017-06-17 MED ORDER — PIOGLITAZONE HCL-METFORMIN HCL 15-850 MG PO TABS: 1.0000 | ORAL_TABLET | Freq: Two times a day (BID) | ORAL | 1 refills | Status: DC

## 2017-06-17 MED ORDER — FUROSEMIDE 20 MG PO TABS: 20.0000 mg | ORAL_TABLET | Freq: Every day | ORAL | 3 refills | Status: DC

## 2017-06-17 MED ORDER — INSULIN GLARGINE 100 UNIT/ML SOLOSTAR PEN: 20.0000 [IU] | PEN_INJECTOR | Freq: Every day | SUBCUTANEOUS | 5 refills | Status: DC

## 2017-06-17 MED ORDER — DICLOFENAC SODIUM 75 MG PO TBEC: 75.0000 mg | DELAYED_RELEASE_TABLET | Freq: Two times a day (BID) | ORAL | 0 refills | Status: DC

## 2017-06-17 MED ORDER — LOVASTATIN 40 MG PO TABS
40.0000 mg | ORAL_TABLET | Freq: Every day | ORAL | 3 refills | Status: DC
Start: 2017-06-17 — End: 2019-07-03

## 2017-06-17 MED ORDER — HYDROCHLOROTHIAZIDE 12.5 MG PO CAPS: 12.5000 mg | ORAL_CAPSULE | Freq: Every day | ORAL | 1 refills | Status: DC

## 2017-06-17 MED ORDER — LISINOPRIL 40 MG PO TABS: 40.0000 mg | ORAL_TABLET | Freq: Every day | ORAL | 3 refills | Status: DC

## 2017-06-17 MED ORDER — HYDROCODONE-ACETAMINOPHEN 5-325 MG PO TABS: 1.0000 | ORAL_TABLET | Freq: Four times a day (QID) | ORAL | 0 refills | Status: DC | PRN

## 2017-06-17 MED ORDER — AMLODIPINE BESYLATE 10 MG PO TABS: 10.0000 mg | ORAL_TABLET | Freq: Every day | ORAL | 3 refills | Status: DC

## 2017-06-17 NOTE — Patient Instructions (Signed)
Drop down to 20 units of insulin every day. Check your blood sugars twice a day either before a meal or 2 hours after. Call and give your blood sugars and how they're doing the next couple weeks

## 2017-06-17 NOTE — Progress Notes (Signed)
   Subjective:    Patient ID: Joe Barry, male    DOB: 1933-09-28, 83 y.o.   MRN: [id number]  HPI He is here for an interval evaluation. He does have underlying diabetes and his daughter is helping take care of him. He is on all his oral medications however he is taking the insulin every other day. In the past he has had some issues with hypoglycemia. He continues to complain of leg swelling but does not keep his legs elevated as much as he should. He also complains of generalized aches and pains but mainly in the joints. He has tried over-the-counter NSAIDs without much success. He has tried tramadol but found it to be very minimally effective. His physical activity is very limited. He is not taking colchicine.   Review of Systems     Objective:   Physical Exam Alert and in no distress. Cardiac exam shows a regular rhythm without murmurs or gallops. Lungs are clear to auscultation. 3+ pitting edema is noted in his lower extremities.   A1c is 7.2     Assessment & Plan:  Diabetes mellitus without complication (HCC) - Plan: HgB A1c, CBC with Differential/Platelet, Comprehensive metabolic panel, Lipid panel, POCT UA - Microalbumin, VAS [place] ABI WITH/WO TBI, pioglitazone-metformin (ACTOPLUS MET) 15-850 MG tablet, Insulin Glargine (LANTUS SOLOSTAR) 100 UNIT/ML Solostar Pen  Need for influenza vaccination - Plan: Flu vaccine HIGH DOSE PF (Fluzone High dose)  PVD (peripheral vascular disease) (HCC) - Plan: furosemide (LASIX) 20 MG tablet  Arthritis associated with diabetes ([place]) - Plan: diclofenac (VOLTAREN) 75 MG EC tablet, HYDROcodone-acetaminophen (NORCO) 5-325 MG tablet  Hypertension associated with diabetes ([place]) - Plan: lisinopril (PRINIVIL,ZESTRIL) 40 MG tablet, hydrochlorothiazide (MICROZIDE) 12.5 MG capsule, furosemide (LASIX) 20 MG tablet, amLODipine (NORVASC) 10 MG tablet  Hyperlipidemia associated with type 2 diabetes mellitus ([place]) - Plan: lovastatin (MEVACOR) 40 MG tablet He  will continue on his present medication regimen. Encouraged him to keep his feet elevated as much as possible and trying get support hose on if possible. I want him to remain as physically active as possible although I will write for a wheelchair as he does have some mobility issues especially when he gets outside of the home. I will switch him to double tear and and see if can hydrocodone will help but mainly at night. Cautioned him about the use of codeine and risk for falls. I also discussed taking all of his diabetes medications and make sure these check his blood sugars twice per day either before meal or 2 hours after meal. I'm cutting back on the insulin. Since his A1c is 7.2 on every other day dosing which is not appropriate, I will cut back and see how his blood sugars stabilize. Over 45 minutes, greater than 50% spent in counseling and coordination of care.

## 2017-06-22 ENCOUNTER — Other Ambulatory Visit: Payer: Self-pay | Admitting: Family Medicine

## 2017-06-22 NOTE — Telephone Encounter (Signed)
This was already sent to [facility] but will resent to [facility]

## 2017-06-29 ENCOUNTER — Ambulatory Visit (HOSPITAL_COMMUNITY)
Admission: RE | Admit: 2017-06-29 | Discharge: 2017-06-29 | Disposition: A | Discharge status: Home / Self Care | Payer: Medicare Other | Source: Ambulatory Visit | Attending: Family Medicine | Admitting: Family Medicine

## 2017-06-29 ENCOUNTER — Other Ambulatory Visit: Payer: Self-pay | Admitting: Family Medicine

## 2017-06-29 ENCOUNTER — Ambulatory Visit (HOSPITAL_BASED_OUTPATIENT_CLINIC_OR_DEPARTMENT_OTHER)
Admission: RE | Admit: 2017-06-29 | Discharge: 2017-06-29 | Disposition: A | Discharge status: Home / Self Care | Payer: Medicare Other | Source: Ambulatory Visit | Attending: Family Medicine | Admitting: Family Medicine

## 2017-06-29 DIAGNOSIS — I739 Peripheral vascular disease, unspecified: Secondary | ICD-10-CM | POA: Diagnosis not present

## 2017-06-29 DIAGNOSIS — E119 Type 2 diabetes mellitus without complications: Secondary | ICD-10-CM

## 2017-06-29 DIAGNOSIS — R9389 Abnormal findings on diagnostic imaging of other specified body structures: Secondary | ICD-10-CM | POA: Diagnosis not present

## 2017-06-29 DIAGNOSIS — I708 Atherosclerosis of other arteries: Secondary | ICD-10-CM | POA: Insufficient documentation

## 2017-06-29 DIAGNOSIS — E1151 Type 2 diabetes mellitus with diabetic peripheral angiopathy without gangrene: Secondary | ICD-10-CM | POA: Insufficient documentation

## 2017-06-29 LAB — VAS US LOWER EXTREMITY ARTERIAL DUPLEX
LATIBDISTSYS: 95 cm/s
LATIBMIDSYS: 63 cm/s
LATIBPSV: 65 cm/s
LPERODISTSYS: -36 cm/s
LPEROMIDSYS: 71 cm/s
LPOPDPSV: -82 cm/s
LPTIBPROXSYS: 54 cm/s
LSFDPSV: -93 cm/s
LSFMPSV: -96 cm/s
LSFPPSV: 108 cm/s
Left popliteal prox sys PSV: 85 cm/s
RIGHT PERO MID SYS: 24 cm/s
RIGHT POPLITEAL DIST EDV: -6 cm/s
RIGHT POPLITEAL PROX EDV: 8 cm/s
RIGHT POST TIB MID DIA: 7 cm/s
RIGHT POST TIB MID SYS: 43 cm/s
RPOPDPSV: -54 cm/s
RTIBDISTDIA: 9 cm/s
RTIBDISTSYS: 45 cm/s
RTPOSTTIBDIA: 11 cm/s
RTSFADISTDIA: -9 cm/s
Right popliteal prox sys PSV: 44 cm/s
Right post tibial sys PSV: 40 cm/s
Right super femoral dist sys PSV: -52 cm/s
left post tibial dist sys: 26 cm/s
left post tibial mid sys: 107 cm/s

## 2017-06-29 NOTE — Progress Notes (Signed)
*  PRELIMINARY RESULTS* Vascular Ultrasound Lower Extremity Arterial Duplex has been completed.  Preliminary findings: Monophasic flow seen throughout the right lower extremity suggesting a more proximal stenosis.  Attempted to visualize right common iliac artery but obscured by bowel and gas.  Biphasic flow seen throughout left lower extremity.  Tempie Donning Armeda Plumb 06/29/2017, 1:00 PM

## 2017-06-30 ENCOUNTER — Other Ambulatory Visit: Payer: Self-pay

## 2017-06-30 DIAGNOSIS — I739 Peripheral vascular disease, unspecified: Secondary | ICD-10-CM

## 2017-07-02 ENCOUNTER — Other Ambulatory Visit: Payer: Self-pay | Admitting: Family Medicine

## 2017-07-02 DIAGNOSIS — E1159 Type 2 diabetes mellitus with other circulatory complications: Secondary | ICD-10-CM

## 2017-07-02 DIAGNOSIS — I152 Hypertension secondary to endocrine disorders: Secondary | ICD-10-CM

## 2017-07-02 DIAGNOSIS — I10 Essential (primary) hypertension: Principal | ICD-10-CM

## 2017-07-14 ENCOUNTER — Encounter: Payer: Medicare Other | Admitting: Vascular Surgery

## 2017-08-17 ENCOUNTER — Encounter: Payer: Medicare Other | Admitting: Vascular Surgery

## 2017-08-27 ENCOUNTER — Other Ambulatory Visit: Payer: Self-pay | Admitting: Family Medicine

## 2017-08-28 ENCOUNTER — Other Ambulatory Visit: Payer: Self-pay | Admitting: Family Medicine

## 2017-09-28 ENCOUNTER — Encounter: Payer: Self-pay | Admitting: Vascular Surgery

## 2017-09-28 ENCOUNTER — Ambulatory Visit (INDEPENDENT_AMBULATORY_CARE_PROVIDER_SITE_OTHER): Payer: Medicare Other | Admitting: Vascular Surgery

## 2017-09-28 VITALS — BP 155/80 | HR 90 | Resp 20 | Ht 64.0 in | Wt 200.0 lb

## 2017-09-28 DIAGNOSIS — M7989 Other specified soft tissue disorders: Secondary | ICD-10-CM | POA: Diagnosis not present

## 2017-09-28 NOTE — Progress Notes (Signed)
[facility]  Patient name: Joe Barry MRN: [id number] DOB: 1934/03/27 Sex: male  REASON FOR CONSULT: Evaluation of bilateral lower extremity swelling  HPI: Joe Barry is a 84 y.o. male, who is here today for evaluation of bilateral lower extremity swelling.  He is known to me from surgery in 2013.  He had presented with a huge false aneurysm extending across his entire pelvis from an old nonfunctional femoral to femoral bypass.  He had resection of this and repair of his common femoral arteries bilaterally with Dacron patches.  He subsequently developed a large lymphocele had this evacuated with a VAC dressing placed and eventual healing.  He is here today with his daughter.  He does minimal walking and is mostly in a wheelchair.  He has had no new groin complications.  He is seen today due to progressive swelling in both lower extremities.  He has no history of DVT.  He does have chronic knee pain from arthritic problems and degenerative disc disease  Past Medical History:  Diagnosis Date  . AAA (abdominal aortic aneurysm) ([place])   . Arthritis   . BPH (benign prostatic hypertrophy)   . Cataract   . Diabetes mellitus   . Dyslipidemia   . Hypertension    Dr. Matthew Folks,   . Neuromuscular disorder ([place])    carpal tunnel in right wrist  . No blood products   . Refusal of blood transfusions as patient is Jehovah's Witness     Family History  Problem Relation Age of Onset  . Cancer Mother        ovarian  . Diabetes Father     SOCIAL HISTORY: Social History   Socioeconomic History  . Marital status: Married    Spouse name: Not on file  . Number of children: Not on file  . Years of education: Not on file  . Highest education level: Not on file  Social Needs  . Financial resource strain: Not on file  . Food insecurity - worry: Not on file  . Food insecurity - inability: Not on file  . Transportation needs -  medical: Not on file  . Transportation needs - non-medical: Not on file  Occupational History  . Not on file  Tobacco Use  . Smoking status: Former Smoker    Types: Cigarettes    Last attempt to quit: 07/26/1972    Years since quitting: 45.2  . Smokeless tobacco: Never Used  Substance and Sexual Activity  . Alcohol use: Yes    Alcohol/week: 1.8 oz    Types: 3 Shots of liquor per week    Comment: every day or so  . Drug use: No  . Sexual activity: Not Currently  Other Topics Concern  . Not on file  Social History Narrative  . Not on file    No Known Allergies  Current Outpatient Medications  Medication Sig Dispense Refill  . lisinopril (PRINIVIL,ZESTRIL) 40 MG tablet Take 1 tablet (40 mg total) by mouth daily. 90 tablet 3  . lovastatin (MEVACOR) 40 MG tablet Take 1 tablet (40 mg total) by mouth at bedtime. 90 tablet 3  . amLODipine (NORVASC) 10 MG tablet Take 1 tablet (10 mg total) by mouth daily. 90 tablet 3  . amLODipine (NORVASC) 10 MG tablet TAKE 1 TABLET BY MOUTH DAILY 30 tablet 5  . Blood Glucose Monitoring Suppl (FORA V12 BLOOD GLUCOSE SYSTEM) DEVI Testing once a day    . colchicine 0.6 MG  tablet Take 0.6 mg by mouth daily as needed. For gout    . COMBIGAN 0.2-0.5 % ophthalmic solution INSTILL 1 DROP INTO BOTH EYES BID.  3  . diclofenac (VOLTAREN) 75 MG EC tablet Take 1 tablet (75 mg total) by mouth 2 (two) times daily. (Patient not taking: Reported on 09/28/2017) 30 tablet 0  . FORA V12 BLOOD GLUCOSE TEST test strip     . furosemide (LASIX) 20 MG tablet Take 1 tablet (20 mg total) by mouth daily. 90 tablet 3  . furosemide (LASIX) 20 MG tablet TAKE 1 TABLET BY MOUTH DAILY (Patient not taking: Reported on 09/28/2017) 90 tablet 3  . hydrochlorothiazide (MICROZIDE) 12.5 MG capsule Take 1 capsule (12.5 mg total) by mouth daily. 90 capsule 1  . hydrochlorothiazide (MICROZIDE) 12.5 MG capsule TAKE ONE CAPSULE BY MOUTH DAILY (Patient not taking: Reported on 09/28/2017) 30 capsule 5   . HYDROcodone-acetaminophen (NORCO) 5-325 MG tablet Take 1 tablet by mouth every 6 (six) hours as needed for moderate pain. 30 tablet 0  . Insulin Glargine (LANTUS SOLOSTAR) 100 UNIT/ML Solostar Pen Inject 20 Units into the skin daily at 10 pm. (Patient not taking: Reported on 09/28/2017) 15 mL 5  . Lancet Devices (LITE TOUCH LANCING DEVICE) MISC     . LANTUS SOLOSTAR 100 UNIT/ML Solostar Pen INJECT 0.3MLS INTO SKIN AT BEDTIME 15 mL 0  . lisinopril (PRINIVIL,ZESTRIL) 40 MG tablet TAKE 1 TABLET BY MOUTH EVERY DAY (Patient not taking: Reported on 09/28/2017) 30 tablet 5  . lovastatin (MEVACOR) 40 MG tablet TAKE 1 TABLET BY MOUTH EVERY NIGHT AT BEDTIME. (Patient not taking: Reported on 09/28/2017) 90 tablet 0  . LUMIGAN 0.01 % SOLN INSTILL 1 DROP INTO BOTH EYES HS.  3  . pioglitazone-metformin (ACTOPLUS MET) 15-850 MG tablet Take 1 tablet by mouth 2 (two) times daily with a meal. 180 tablet 1  . pioglitazone-metformin (ACTOPLUS MET) 15-850 MG tablet TAKE 1 TABLET BY MOUTH TWICE DAILY WITH MEALS (Patient not taking: Reported on 09/28/2017) 60 tablet 5  . traMADol (ULTRAM) 50 MG tablet TAKE 1 TABLET BY MOUTH EVERY 8 HOURS AS NEEDED FOR PAIN. (Patient not taking: Reported on 06/17/2017) 90 tablet 0   No current facility-administered medications for this visit.     REVIEW OF SYSTEMS:  '[X]'$  denotes positive finding, '[ ]'$  denotes negative finding Cardiac  Comments:  Chest pain or chest pressure:    Shortness of breath upon exertion:    Short of breath when lying flat:    Irregular heart rhythm:        Vascular    Pain in calf, thigh, or hip brought on by ambulation:    Pain in feet at night that wakes you up from your sleep:     Blood clot in your veins:    Leg swelling:  x       Pulmonary    Oxygen at home:    Productive cough:     Wheezing:         Neurologic    Sudden weakness in arms or legs:     Sudden numbness in arms or legs:     Sudden onset of difficulty speaking or slurred speech:      Temporary loss of vision in one eye:     Problems with dizziness:         Gastrointestinal    Blood in stool:     Vomited blood:         Genitourinary  Burning when urinating:     Blood in urine:        Psychiatric    Major depression:         Hematologic    Bleeding problems:    Problems with blood clotting too easily:        Skin    Rashes or ulcers:        Constitutional    Fever or chills:      PHYSICAL EXAM: Vitals:   09/28/17 1500 09/28/17 1502  BP: (!) 158/82 (!) 155/80  Pulse: 90   Resp: 20   SpO2: 96%   Weight: 200 lb (90.7 kg)   Height: '5\' 4"'$  (1.626 m)     GENERAL: The patient is a well-nourished male, in no acute distress. The vital signs are documented above. CARDIOVASCULAR: 2+ radial pulses.  I do not palpate femoral pulses.  He does have a great deal of obesity.  There are no false aneurysm.  No palpable distal pulses.  He does have significant edema bilaterally but no changes of venous hypertension.  Swelling extends onto his feet bilaterally and appears more consistent with lymphedema PULMONARY: There is good air exchange  ABDOMEN: Soft and non-tender  MUSCULOSKELETAL: There are no major deformities or cyanosis. NEUROLOGIC: No focal weakness or paresthesias are detected. SKIN: There are no ulcers or rashes noted. PSYCHIATRIC: The patient has a normal affect.  DATA:  None  MEDICAL ISSUES: No evidence of arterial insufficiency and no tissue loss.  He does have physical findings consistent with lymphedema.  This could be related to his prior groin surgery.  I discussed this with the patient and his daughter present.  Explained that this should not cause any risk for tissue loss.  He does have known moderate chronic arterial insufficiency but is asymptomatic from this.  He is not able to wear compression.  His daughter reports that this is been attempted in the past but not surprisingly he is been able unable to get this on her tolerate this.  He will  continue elevation when possible.  Will see [place] again on an as-needed basis   Rosetta Posner, MD [facility] Tel [phone] Pager [phone]

## 2017-10-20 ENCOUNTER — Other Ambulatory Visit: Payer: Self-pay | Admitting: Family Medicine

## 2017-11-29 ENCOUNTER — Other Ambulatory Visit: Payer: Self-pay | Admitting: Family Medicine

## 2018-03-23 ENCOUNTER — Other Ambulatory Visit: Payer: Self-pay | Admitting: Family Medicine

## 2018-03-23 DIAGNOSIS — E1159 Type 2 diabetes mellitus with other circulatory complications: Secondary | ICD-10-CM

## 2018-03-23 DIAGNOSIS — I152 Hypertension secondary to endocrine disorders: Secondary | ICD-10-CM

## 2018-03-23 DIAGNOSIS — I10 Essential (primary) hypertension: Principal | ICD-10-CM

## 2018-04-18 ENCOUNTER — Ambulatory Visit: Payer: Medicare Other | Admitting: Family Medicine

## 2018-04-18 ENCOUNTER — Encounter: Payer: Self-pay | Admitting: Family Medicine

## 2018-04-18 VITALS — BP 152/90 | HR 88 | Temp 97.9°F | Wt 197.2 lb

## 2018-04-18 DIAGNOSIS — I10 Essential (primary) hypertension: Secondary | ICD-10-CM

## 2018-04-18 DIAGNOSIS — E1169 Type 2 diabetes mellitus with other specified complication: Secondary | ICD-10-CM | POA: Diagnosis not present

## 2018-04-18 DIAGNOSIS — Z0000 Encounter for general adult medical examination without abnormal findings: Secondary | ICD-10-CM

## 2018-04-18 DIAGNOSIS — E11618 Type 2 diabetes mellitus with other diabetic arthropathy: Secondary | ICD-10-CM | POA: Diagnosis not present

## 2018-04-18 DIAGNOSIS — I739 Peripheral vascular disease, unspecified: Secondary | ICD-10-CM

## 2018-04-18 DIAGNOSIS — B351 Tinea unguium: Secondary | ICD-10-CM

## 2018-04-18 DIAGNOSIS — E118 Type 2 diabetes mellitus with unspecified complications: Secondary | ICD-10-CM | POA: Diagnosis not present

## 2018-04-18 DIAGNOSIS — E669 Obesity, unspecified: Secondary | ICD-10-CM | POA: Insufficient documentation

## 2018-04-18 DIAGNOSIS — I890 Lymphedema, not elsewhere classified: Secondary | ICD-10-CM

## 2018-04-18 DIAGNOSIS — Z794 Long term (current) use of insulin: Secondary | ICD-10-CM | POA: Diagnosis not present

## 2018-04-18 DIAGNOSIS — I152 Hypertension secondary to endocrine disorders: Secondary | ICD-10-CM

## 2018-04-18 DIAGNOSIS — E1159 Type 2 diabetes mellitus with other circulatory complications: Secondary | ICD-10-CM | POA: Diagnosis not present

## 2018-04-18 DIAGNOSIS — E785 Hyperlipidemia, unspecified: Secondary | ICD-10-CM

## 2018-04-18 LAB — POCT GLYCOSYLATED HEMOGLOBIN (HGB A1C): HEMOGLOBIN A1C: 8.8 % — AB (ref 4.0–5.6)

## 2018-04-18 MED ORDER — OLMESARTAN MEDOXOMIL 20 MG PO TABS: 20.0000 mg | ORAL_TABLET | Freq: Every day | ORAL | 3 refills | Status: DC

## 2018-04-18 NOTE — Patient Instructions (Signed)
Check your blood sugars twice a day either before a meal or 2 hours after meal.

## 2018-04-18 NOTE — Progress Notes (Signed)
Joe Barry is a 84 y.o. male who presents for annual wellness visit and follow-up on chronic medical conditions.  He has diabetes and presently is on Actos plus and taking roughly 30 units of Lantus.  He is not checking his blood sugars regularly.  His daughter aches that he is probably also missing doses of his Lantus.  He does use Meals on Wheels.  He does drink very rarely.  He does have underlying PVD and is seen Dr. Arbie CookeyEarly in the past for that.  He also has a history of lymphedema however getting him to wear support stockings would be very very difficult.  Continues on his blood pressure medication.  He is not on an ACE inhibitor as his daughter does not want him on one.  He is concerned about the possibility of angioedema.  Does have a previous history of AAA with a sleeve being placed.  He is overweight but his weight is relatively stable and not in the morbidly obese category.  Apparently his glucometer is not working properly.  He continues to complain of arthritic symptoms not responding to Tylenol.  He has occasionally taken codeine however it makes him dizzy  Immunizations and Health Maintenance Immunization History  Administered Date(s) Administered  . Influenza Split 06/20/2009, 08/24/2011, 07/17/2012  . Influenza, High Dose Seasonal PF 07/05/2013, 06/12/2014, 05/14/2015, 10/27/2016, 06/17/2017  . Pneumococcal Conjugate-13 11/25/2015  . Pneumococcal Polysaccharide-23 01/21/2012  . Tdap 01/21/2012   Health Maintenance Due  Topic Date Due  . OPHTHALMOLOGY EXAM  07/29/2016  . HEMOGLOBIN A1C  12/16/2017  . INFLUENZA VACCINE  04/14/2018    Last colonoscopy: over ten years Last PSA: may have been in lab Dentist: 3-4 years Ophtho: 6 months ago Exercise: walking around the house   Other doctors caring for patient include: Early .Ashley RoyaltyMatthews  Advanced Directives: Yes.  Copy asked for.    Depression screen:  See questionnaire below.     Depression screen [facility] 2/9 04/18/2018 04/03/2016  11/25/2015 01/21/2012  Decreased Interest 0 0 0 0  Down, Depressed, Hopeless 0 0 0 0  PHQ - 2 Score 0 0 0 0    Fall Screen: See Questionaire below.   Fall Risk  04/18/2018 04/03/2016 11/25/2015 01/21/2012  Falls in the past year? No No Yes -  Number falls in past yr: - - 2 or more -  Injury with Fall? - - No -  Risk for fall due to : - - - Impaired mobility    ADL screen:  See questionnaire below.  Functional Status Survey: Is the patient deaf or have difficulty hearing?: No Does the patient have difficulty seeing, even when wearing glasses/contacts?: No Does the patient have difficulty concentrating, remembering, or making decisions?: No Does the patient have difficulty walking or climbing stairs?: Yes Does the patient have difficulty dressing or bathing?: No Does the patient have difficulty doing errands alone such as visiting a doctor's office or shopping?: Yes   Review of Systems  Constitutional: -, -unexpected weight change, -anorexia, -fatigue Allergy: -sneezing, -itching, -congestion Dermatology: denies changing moles, rash, lumps ENT: -runny nose, -ear pain, -sore throat,  Cardiology:  -chest pain, -palpitations, -orthopnea, Respiratory: -cough, -shortness of breath, -dyspnea on exertion, -wheezing,  Gastroenterology: -abdominal pain, -nausea, -vomiting, -diarrhea, -constipation, -dysphagia Hematology: -bleeding or bruising problems Musculoskeletal: -arthralgias, -myalgias, -joint swelling, -back pain, - Ophthalmology: -vision changes,  Urology: -dysuria, -difficulty urinating,  -urinary frequency, -urgency, incontinence Neurology: -, -numbness, , -memory loss, -falls, -dizziness    PHYSICAL EXAM:  General Appearance: Alert, cooperative, no distress, appears stated age Head: Normocephalic, without obvious abnormality, atraumatic Eyes: PERRL, conjunctiva/corneas clear, EOM's intact, fundi benign Ears: Normal TM's and external ear canals Nose: Nares normal, mucosa  normal, no drainage or sinus   tenderness Throat: Lips, mucosa, and tongue normal; teeth and gums normal Neck: Supple, no lymphadenopathy, thyroid:no enlargement/tenderness/nodules; no carotid bruit or JVD Lungs: Clear to auscultation bilaterally without wheezes, rales or ronchi; respirations unlabored Heart: Regular rate and rhythm, S1 and S2 normal, no murmur, rub or gallop Abdomen: Soft, non-tender, nondistended, normoactive bowel sounds, no masses, no hepatosplenomegaly Extremities: No clubbing, cyanosis or edema Pulses: 2+ and symmetric all extremities Skin: Skin color, texture, turgor normal, no rashes or lesions.  Toenail thickening is noted. Lymph nodes: Cervical, supraclavicular, and axillary nodes normal Neurologic: CNII-XII intact, normal strength, sensation and gait; reflexes 2+ and symmetric throughout   Psych: Normal mood, affect, hygiene and grooming A1c is 8.8 ASSESSMENT/PLAN: Routine general medical examination at a health care facility  PVD (peripheral vascular disease) (HCC)  Arthritis associated with diabetes (HCC)  Hypertension associated with diabetes (HCC)  Hyperlipidemia associated with type 2 diabetes mellitus (HCC)  Lymphedema  Onychomycosis  Type 2 diabetes mellitus with complication, with long-term current use of insulin (HCC) - Plan: POCT glycosylated hemoglobin (Hb A1C)  Obesity (BMI 30-39.9) His daughter did not want him on lisinopril to the risk of angioedema and I will therefore switch to olmesartan.  Recommend he give himself the daily shots of insulin to see if this will help with his A1c.  Keep his legs elevated as much as possible.  Getting him to use support stockings to help with his lymphedema would be onerous to say the least.  He will continue on his other medications.  No therapy at the present time for the onychomycosis.  Both he and his daughter are comfortable with that.     Medicare Attestation I have personally reviewed: The  patient's medical and social history Their use of alcohol, tobacco or illicit drugs Their current medications and supplements The patient's functional ability including ADLs,fall risks, home safety risks, cognitive, and hearing and visual impairment Diet and physical activities Evidence for depression or mood disorders  The patient's weight, height, and BMI have been recorded in the chart.  I have made referrals, counseling, and provided education to the patient based on review of the above and I have provided the patient with a written personalized care plan for preventive services.     [name], MD   04/18/2018

## 2018-04-26 ENCOUNTER — Other Ambulatory Visit: Payer: Self-pay | Admitting: Family Medicine

## 2018-04-26 DIAGNOSIS — I10 Essential (primary) hypertension: Principal | ICD-10-CM

## 2018-04-26 DIAGNOSIS — I152 Hypertension secondary to endocrine disorders: Secondary | ICD-10-CM

## 2018-04-26 DIAGNOSIS — E1159 Type 2 diabetes mellitus with other circulatory complications: Secondary | ICD-10-CM

## 2018-05-28 ENCOUNTER — Other Ambulatory Visit: Payer: Self-pay | Admitting: Family Medicine

## 2018-05-28 DIAGNOSIS — E1159 Type 2 diabetes mellitus with other circulatory complications: Secondary | ICD-10-CM

## 2018-05-28 DIAGNOSIS — I10 Essential (primary) hypertension: Principal | ICD-10-CM

## 2018-07-15 ENCOUNTER — Other Ambulatory Visit: Payer: Self-pay | Admitting: Family Medicine

## 2018-07-15 DIAGNOSIS — I10 Essential (primary) hypertension: Principal | ICD-10-CM

## 2018-07-15 DIAGNOSIS — E1159 Type 2 diabetes mellitus with other circulatory complications: Secondary | ICD-10-CM

## 2018-12-21 ENCOUNTER — Other Ambulatory Visit: Payer: Self-pay | Admitting: Family Medicine

## 2019-03-30 ENCOUNTER — Other Ambulatory Visit: Payer: Self-pay | Admitting: Family Medicine

## 2019-03-30 DIAGNOSIS — E1159 Type 2 diabetes mellitus with other circulatory complications: Secondary | ICD-10-CM

## 2019-05-25 ENCOUNTER — Ambulatory Visit: Payer: Medicare Other | Admitting: Family Medicine

## 2019-06-28 ENCOUNTER — Other Ambulatory Visit: Payer: Self-pay | Admitting: Family Medicine

## 2019-06-28 DIAGNOSIS — E1159 Type 2 diabetes mellitus with other circulatory complications: Secondary | ICD-10-CM

## 2019-06-28 NOTE — Telephone Encounter (Signed)
Received a fax from [facility] for pt. Joe Barry for furosemide

## 2019-07-03 ENCOUNTER — Encounter: Payer: Self-pay | Admitting: Family Medicine

## 2019-07-03 ENCOUNTER — Other Ambulatory Visit: Payer: Self-pay

## 2019-07-03 ENCOUNTER — Ambulatory Visit: Payer: Medicare Other | Admitting: Family Medicine

## 2019-07-03 VITALS — BP 152/90 | HR 67 | Temp 96.0°F | Wt 188.2 lb

## 2019-07-03 DIAGNOSIS — E11618 Type 2 diabetes mellitus with other diabetic arthropathy: Secondary | ICD-10-CM

## 2019-07-03 DIAGNOSIS — B351 Tinea unguium: Secondary | ICD-10-CM

## 2019-07-03 DIAGNOSIS — Z0000 Encounter for general adult medical examination without abnormal findings: Secondary | ICD-10-CM

## 2019-07-03 DIAGNOSIS — E118 Type 2 diabetes mellitus with unspecified complications: Secondary | ICD-10-CM

## 2019-07-03 DIAGNOSIS — E1169 Type 2 diabetes mellitus with other specified complication: Secondary | ICD-10-CM | POA: Diagnosis not present

## 2019-07-03 DIAGNOSIS — Z23 Encounter for immunization: Secondary | ICD-10-CM

## 2019-07-03 DIAGNOSIS — I739 Peripheral vascular disease, unspecified: Secondary | ICD-10-CM

## 2019-07-03 DIAGNOSIS — E1159 Type 2 diabetes mellitus with other circulatory complications: Secondary | ICD-10-CM

## 2019-07-03 DIAGNOSIS — I10 Essential (primary) hypertension: Secondary | ICD-10-CM

## 2019-07-03 DIAGNOSIS — E785 Hyperlipidemia, unspecified: Secondary | ICD-10-CM

## 2019-07-03 DIAGNOSIS — I890 Lymphedema, not elsewhere classified: Secondary | ICD-10-CM

## 2019-07-03 LAB — POCT GLYCOSYLATED HEMOGLOBIN (HGB A1C): Hemoglobin A1C: 12.6 % — AB (ref 4.0–5.6)

## 2019-07-03 MED ORDER — LOVASTATIN 40 MG PO TABS: 40.0000 mg | ORAL_TABLET | Freq: Every day | ORAL | 3 refills | Status: DC

## 2019-07-03 MED ORDER — SITAGLIPTIN PHOSPHATE 50 MG PO TABS: 50.0000 mg | ORAL_TABLET | Freq: Every day | ORAL | 3 refills | Status: DC

## 2019-07-03 MED ORDER — ACCU-CHEK GUIDE VI STRP: ORAL_STRIP | 12 refills | Status: DC

## 2019-07-03 MED ORDER — HYDROCHLOROTHIAZIDE 12.5 MG PO CAPS: 12.5000 mg | ORAL_CAPSULE | Freq: Every day | ORAL | 3 refills | Status: DC

## 2019-07-03 MED ORDER — OLMESARTAN MEDOXOMIL 5 MG PO TABS: 5.0000 mg | ORAL_TABLET | Freq: Every day | ORAL | 3 refills | Status: DC

## 2019-07-03 MED ORDER — ACCU-CHEK MULTICLIX LANCETS MISC: 12 refills | Status: DC

## 2019-07-03 MED ORDER — FARXIGA 5 MG PO TABS: 5.0000 mg | ORAL_TABLET | Freq: Every day | ORAL | 1 refills | Status: DC

## 2019-07-03 NOTE — Progress Notes (Signed)
Joe Barry is a 85 y.o. male who presents for annual wellness visit,CPE and follow-up on chronic medical conditions.  He has not been using his insulin.  His eating habits are quite erratic and his daughter is concerned about hypoglycemia.  He admits to not want to check his sugars with any regularity.  He does have an eye appointment set up in the near future.  He has been seen in the past for peripheral vascular disease at this point no further intervention is really needed.  He also has lymphedema associated with this.  He also has difficulty with arthritis which interferes with his physical activities.  His daughter would like a home health visit to assess his needs especially with his ADLs.  He has no other concerns or complaints.  His daughter is with him.  Family and social history was also reviewed.   Immunizations and Health Maintenance Immunization History  Administered Date(s) Administered  . Fluad Quad(high Dose 65+) 07/03/2019  . Influenza Split 06/20/2009, 08/24/2011, 07/17/2012  . Influenza, High Dose Seasonal PF 07/05/2013, 06/12/2014, 05/14/2015, 10/27/2016, 06/17/2017  . Pneumococcal Conjugate-13 11/25/2015  . Pneumococcal Polysaccharide-23 01/21/2012  . Tdap 01/21/2012   Health Maintenance Due  Topic Date Due  . OPHTHALMOLOGY EXAM  07/29/2016  . FOOT EXAM  06/17/2018  . HEMOGLOBIN A1C  10/19/2018  . INFLUENZA VACCINE  04/15/2019    Last colonoscopy: over ten years Last PSA: unknown and no history Dentist: dentures  Ophtho: 2019 Exercise: none  Other doctors caring for patient include:N/A  Advanced Directives: not on file daughter is his DURABLE POWER OF ATTORNEY Does Patient Have a Medical Advance Directive?: No Would patient like information on creating a medical advance directive?: No - Patient declined  Depression screen:  See questionnaire below.     Depression screen [facility] 2/9 07/03/2019 04/18/2018 04/03/2016 11/25/2015 01/21/2012  Decreased Interest 0 0 0 0 0   Down, Depressed, Hopeless 0 0 0 0 0  PHQ - 2 Score 0 0 0 0 0    Fall Screen: See Questionaire below.   Fall Risk  07/03/2019 04/18/2018 04/03/2016 11/25/2015 01/21/2012  Falls in the past year? 1 No No Yes -  Number falls in past yr: 1 - - 2 or more -  Injury with Fall? 0 - - No -  Risk for fall due to : - - - - Impaired mobility    ADL screen:  See questionnaire below.  Functional Status Survey: Is the patient deaf or have difficulty hearing?: No Does the patient have difficulty seeing, even when wearing glasses/contacts?: No Does the patient have difficulty concentrating, remembering, or making decisions?: Yes(daughter helps) Does the patient have difficulty walking or climbing stairs?: Yes(daughters helps) Does the patient have difficulty dressing or bathing?: Yes(daughter helps) Does the patient have difficulty doing errands alone such as visiting a doctor's office or shopping?: Yes(daughters helps)   Review of Systems  Constitutional: -, -unexpected weight change, -anorexia, -fatigue Allergy: -sneezing, -itching, -congestion Dermatology: denies changing moles, rash, lumps ENT: -runny nose, -ear pain, -sore throat,  Cardiology:  -chest pain, -palpitations, -orthopnea, Respiratory: -cough, -shortness of breath, -dyspnea on exertion, -wheezing,  Gastroenterology: -abdominal pain, -nausea, -vomiting, -diarrhea, -constipation, -dysphagia Hematology: -bleeding or bruising problems Musculoskeletal: -arthralgias, -myalgias, -joint swelling, -back pain, - Ophthalmology: -vision changes,  Urology: -dysuria, -difficulty urinating,  -urinary frequency, -urgency, incontinence Neurology: -, -numbness, , -memory loss, -falls, -dizziness    PHYSICAL EXAM:   General Appearance: Alert, cooperative, no distress, appears stated age Head: Normocephalic,  without obvious abnormality, atraumatic Eyes: PERRL, conjunctiva/corneas clear, EOM's intact, Ears: Normal TM's and external ear  canals Nose: Nares normal, mucosa normal, no drainage or sinus   tenderness Throat: Lips, mucosa, and tongue normal; teeth and gums normal Neck: Supple, no lymphadenopathy, thyroid:no enlargement/tenderness/nodules; no carotid bruit or JVD Lungs: Clear to auscultation bilaterally without wheezes, rales or ronchi; respirations unlabored Heart: Regular rate and rhythm, S1 and S2 normal, no murmur, rub or gallop Abdomen: Soft, non-tender, nondistended, normoactive bowel sounds, no masses, no hepatosplenomegaly Extremities: No clubbing, cyanosis, 3+ pitting edema is noted.  Pulses were difficult to feel.  Skin: Skin color, texture, turgor normal, no rashes or lesions Lymph nodes: Cervical, supraclavicular, and axillary nodes normal Neurologic: CNII-XII intact, normal strength, sensation and gait; reflexes 2+ and symmetric throughout   Psych: Normal mood, affect, hygiene and grooming Hemoglobin A1c is 12.6  ASSESSMENT/PLAN: Routine general medical examination at a health care facility  PVD (peripheral vascular disease) ([place])  Hyperlipidemia associated with type 2 diabetes mellitus ([place]) - Plan: lovastatin (MEVACOR) 40 MG tablet, Lipid panel  Hypertension associated with diabetes ([place]) - Plan: olmesartan (BENICAR) 5 MG tablet, hydrochlorothiazide (MICROZIDE) 12.5 MG capsule, CBC with Differential/Platelet, Comprehensive metabolic panel  Type 2 diabetes mellitus with complications ([place]) - Plan: sitaGLIPtin (JANUVIA) 50 MG tablet, dapagliflozin propanediol (FARXIGA) 5 MG TABS tablet, CBC with Differential/Platelet, Comprehensive metabolic panel, Lipid panel, Lancets (ACCU-CHEK MULTICLIX) lancets, glucose blood (ACCU-CHEK GUIDE) test strip, POCT glycosylated hemoglobin (Hb A1C), CANCELED: POCT UA - Microalbumin  Arthritis associated with diabetes ([place])  Onychomycosis  Lymphedema  Need for influenza vaccination - Plan: Flu Vaccine QUAD High Dose(Fluad) I will add Januvia and Farxiga to his  regimen and stop the insulin.  I think is probably too dangerous to continue him on that especially with his erratic eating habits and not really checking his blood sugars with any regularity.  We will also have home health come by to assess his ADLs.  He will stop taking his Lasix.  Start him back on Benicar.  Medicare Attestation I have personally reviewed: The patient's medical and social history Their use of alcohol, tobacco or illicit drugs Their current medications and supplements The patient's functional ability including ADLs,fall risks, home safety risks, cognitive, and hearing and visual impairment Diet and physical activities Evidence for depression or mood disorders  The patient's weight, height, and BMI have been recorded in the chart.  I have made referrals, counseling, and provided education to the patient based on review of the above and I have provided the patient with a written personalized care plan for preventive services.     Jill Alexanders, MD   07/03/2019

## 2019-07-03 NOTE — Patient Instructions (Signed)
  Mr. Bosler , Thank you for taking time to come for your Medicare Wellness Visit. I appreciate your ongoing commitment to your health goals. Please review the following plan we discussed and let me know if I can assist you in the future.   These are the goals we discussed: With him and his daughter including stopping insulin, starting to other medications.  We will also get home health involved.   This is a list of the screening recommended for you and due dates:  Health Maintenance  Topic Date Due  . Eye exam for diabetics  07/29/2016  . Complete foot exam   06/17/2018  . Hemoglobin A1C  10/19/2018  . Flu Shot  04/15/2019  . Tetanus Vaccine  01/20/2022  . Pneumonia vaccines  Completed

## 2019-07-04 LAB — CBC WITH DIFFERENTIAL/PLATELET
Basophils Absolute: 0 10*3/uL (ref 0.0–0.2)
Basos: 1 %
EOS (ABSOLUTE): 0.1 10*3/uL (ref 0.0–0.4)
Eos: 3 %
Hematocrit: 40.3 % (ref 37.5–51.0)
Hemoglobin: 13.1 g/dL (ref 13.0–17.7)
Immature Grans (Abs): 0 10*3/uL (ref 0.0–0.1)
Immature Granulocytes: 0 %
Lymphocytes Absolute: 1.5 10*3/uL (ref 0.7–3.1)
Lymphs: 27 %
MCH: 29.6 pg (ref 26.6–33.0)
MCHC: 32.5 g/dL (ref 31.5–35.7)
MCV: 91 fL (ref 79–97)
Monocytes Absolute: 0.6 10*3/uL (ref 0.1–0.9)
Monocytes: 10 %
Neutrophils Absolute: 3.4 10*3/uL (ref 1.4–7.0)
Neutrophils: 59 %
Platelets: 237 10*3/uL (ref 150–450)
RBC: 4.42 x10E6/uL (ref 4.14–5.80)
RDW: 13.7 % (ref 11.6–15.4)
WBC: 5.7 10*3/uL (ref 3.4–10.8)

## 2019-07-04 LAB — COMPREHENSIVE METABOLIC PANEL
ALT: 13 IU/L (ref 0–44)
AST: 14 IU/L (ref 0–40)
Albumin/Globulin Ratio: 1.4 (ref 1.2–2.2)
Albumin: 4.4 g/dL (ref 3.6–4.6)
Alkaline Phosphatase: 93 IU/L (ref 39–117)
BUN/Creatinine Ratio: 22 (ref 10–24)
BUN: 21 mg/dL (ref 8–27)
Bilirubin Total: 0.4 mg/dL (ref 0.0–1.2)
CO2: 26 mmol/L (ref 20–29)
Calcium: 10.4 mg/dL — ABNORMAL HIGH (ref 8.6–10.2)
Chloride: 102 mmol/L (ref 96–106)
Creatinine, Ser: 0.96 mg/dL (ref 0.76–1.27)
GFR calc Af Amer: 84 mL/min/{1.73_m2} (ref >59)
GFR calc non Af Amer: 73 mL/min/{1.73_m2} (ref >59)
Globulin, Total: 3.1 g/dL (ref 1.5–4.5)
Glucose: 292 mg/dL — ABNORMAL HIGH (ref 65–99)
Potassium: 5 mmol/L (ref 3.5–5.2)
Sodium: 143 mmol/L (ref 134–144)
Total Protein: 7.5 g/dL (ref 6.0–8.5)

## 2019-07-04 LAB — LIPID PANEL
Chol/HDL Ratio: 2 ratio (ref 0.0–5.0)
Cholesterol, Total: 166 mg/dL (ref 100–199)
HDL: 84 mg/dL (ref >39)
LDL Chol Calc (NIH): 70 mg/dL (ref 0–99)
Triglycerides: 62 mg/dL (ref 0–149)
VLDL Cholesterol Cal: 12 mg/dL (ref 5–40)

## 2019-07-11 ENCOUNTER — Telehealth: Payer: Self-pay

## 2019-07-11 ENCOUNTER — Other Ambulatory Visit: Payer: Self-pay

## 2019-07-11 MED ORDER — ACCU-CHEK FASTCLIX LANCETS MISC: 1.0000 | Freq: Once | 1 refills | Status: DC

## 2019-07-11 NOTE — Telephone Encounter (Signed)
Sent in replacement. [place]

## 2019-07-11 NOTE — Telephone Encounter (Signed)
Received a fax from [employer]. For a refill on Accu-chek multiclix lancets #204. Was filled on 07/03/19 but note stats they are no longer able order that and needs to be changed to another accu chek lancet.

## 2019-07-19 ENCOUNTER — Other Ambulatory Visit: Payer: Self-pay | Admitting: Family Medicine

## 2019-07-19 DIAGNOSIS — E1169 Type 2 diabetes mellitus with other specified complication: Secondary | ICD-10-CM

## 2019-07-21 ENCOUNTER — Telehealth: Payer: Self-pay | Admitting: Family Medicine

## 2019-07-21 NOTE — Telephone Encounter (Signed)
P.A. FARXIGA 

## 2019-07-22 NOTE — Telephone Encounter (Signed)
P.A. denied pt needs trial of the following before Farxiga can be approved,  Invokamet, Invokamet XR, Invokana; AND  (1) of the following covered drugs: Jardiance, Synjardy, Synjardy XR.  Do you want to switch?

## 2019-07-23 MED ORDER — JARDIANCE 10 MG PO TABS: 10.0000 mg | ORAL_TABLET | Freq: Every day | ORAL | 5 refills | Status: DC

## 2019-07-23 NOTE — Telephone Encounter (Signed)
I switched him to [employer]

## 2019-07-24 ENCOUNTER — Telehealth: Payer: Self-pay | Admitting: Family Medicine

## 2019-07-24 NOTE — Telephone Encounter (Signed)
Daughter Danae Chen states hasn't heard anything from [facility].  Please check on this and let her know Thanks

## 2019-07-24 NOTE — Telephone Encounter (Signed)
Called daughter and informed of medication change.  Medication was supposed to go to [facility] not [facility], called & changed

## 2019-07-25 ENCOUNTER — Other Ambulatory Visit: Payer: Self-pay

## 2019-07-25 DIAGNOSIS — E11618 Type 2 diabetes mellitus with other diabetic arthropathy: Secondary | ICD-10-CM

## 2019-07-25 DIAGNOSIS — Z742 Need for assistance at home and no other household member able to render care: Secondary | ICD-10-CM

## 2019-07-25 NOTE — Telephone Encounter (Signed)
Done [facility] 

## 2019-07-27 ENCOUNTER — Telehealth: Payer: Self-pay | Admitting: Family Medicine

## 2019-07-27 NOTE — Telephone Encounter (Signed)
Left detailed message that you ok'ed verbal orders

## 2019-07-27 NOTE — Telephone Encounter (Signed)
Received at call from [facility]. She is requested verbal orders for this pt. She is requesting PT as follows:  2 a week for 3 weeks 2 a week for 1 week  1 a week for 1 week  Please advise her at [phone].

## 2019-07-27 NOTE — Telephone Encounter (Signed)
ok 

## 2019-07-28 ENCOUNTER — Other Ambulatory Visit: Payer: Self-pay | Admitting: Family Medicine

## 2019-07-28 DIAGNOSIS — E1159 Type 2 diabetes mellitus with other circulatory complications: Secondary | ICD-10-CM

## 2019-08-14 ENCOUNTER — Telehealth: Payer: Self-pay | Admitting: Family Medicine

## 2019-08-14 NOTE — Telephone Encounter (Signed)
Done [facility] 

## 2019-08-14 NOTE — Telephone Encounter (Signed)
Received a call from [facility] with [facility]. She would like verbal orders for a social work to come out and evaluate pt for assistance. Please advise her at [phone].

## 2019-08-14 NOTE — Telephone Encounter (Signed)
ok 

## 2019-09-26 ENCOUNTER — Other Ambulatory Visit: Payer: Self-pay | Admitting: Family Medicine

## 2019-09-26 ENCOUNTER — Other Ambulatory Visit: Payer: Self-pay | Admitting: Medical

## 2019-09-26 DIAGNOSIS — E1159 Type 2 diabetes mellitus with other circulatory complications: Secondary | ICD-10-CM

## 2019-11-09 ENCOUNTER — Ambulatory Visit: Payer: Medicare PPO | Attending: Internal Medicine

## 2019-11-09 DIAGNOSIS — Z23 Encounter for immunization: Secondary | ICD-10-CM

## 2019-11-09 NOTE — Progress Notes (Signed)
   Covid-19 Vaccination Clinic  Name:  Joe Barry    MRN: [id number] DOB: 1934-02-22  11/09/2019  Mr. Joe Barry was observed post Covid-19 immunization for 15 minutes without incidence. He was provided with Vaccine Information Sheet and instruction to access the V-Safe system.   Mr. Joe Barry was instructed to call 911 with any severe reactions post vaccine: Marland Kitchen Difficulty breathing  . Swelling of your face and throat  . A fast heartbeat  . A bad rash all over your body  . Dizziness and weakness    Immunizations Administered    Name Date Dose VIS Date Route   Pfizer COVID-19 Vaccine 11/09/2019  3:11 PM 0.3 mL 08/25/2019 Intramuscular   Manufacturer: [employer], [employer]   Lot: [id number]   NDC: [address]-8

## 2019-12-05 ENCOUNTER — Other Ambulatory Visit: Payer: Self-pay | Admitting: Family Medicine

## 2019-12-05 ENCOUNTER — Ambulatory Visit: Payer: Medicare PPO | Attending: Internal Medicine

## 2019-12-05 DIAGNOSIS — I152 Hypertension secondary to endocrine disorders: Secondary | ICD-10-CM

## 2019-12-05 DIAGNOSIS — Z23 Encounter for immunization: Secondary | ICD-10-CM

## 2019-12-05 DIAGNOSIS — E1159 Type 2 diabetes mellitus with other circulatory complications: Secondary | ICD-10-CM

## 2019-12-05 NOTE — Progress Notes (Signed)
   Covid-19 Vaccination Clinic  Name:  Joe Barry    MRN: [id number] DOB: 02-23-1934  12/05/2019  [name] was observed post Covid-19 immunization for 15 minutes without incident. He was provided with Vaccine Information Sheet and instruction to access the V-Safe system.   [name] was instructed to call 911 with any severe reactions post vaccine: Marland Kitchen Difficulty breathing  . Swelling of face and throat  . A fast heartbeat  . A bad rash all over body  . Dizziness and weakness   Immunizations Administered    Name Date Dose VIS Date Route   Pfizer COVID-19 Vaccine 12/05/2019 10:39 AM 0.3 mL 08/25/2019 Intramuscular   Manufacturer: [employer], [employer]   Lot: [id number]   NDC: [address]-8

## 2019-12-12 ENCOUNTER — Encounter (HOSPITAL_COMMUNITY): Payer: Self-pay | Admitting: Emergency Medicine

## 2019-12-12 ENCOUNTER — Emergency Department (HOSPITAL_COMMUNITY): Payer: Medicare PPO

## 2019-12-12 ENCOUNTER — Emergency Department (HOSPITAL_BASED_OUTPATIENT_CLINIC_OR_DEPARTMENT_OTHER): Payer: Medicare PPO

## 2019-12-12 ENCOUNTER — Emergency Department (HOSPITAL_COMMUNITY)
Admission: EM | Admit: 2019-12-12 | Discharge: 2019-12-12 | Disposition: A | Discharge status: Home / Self Care | Payer: Medicare PPO | Attending: Emergency Medicine | Admitting: Emergency Medicine

## 2019-12-12 ENCOUNTER — Other Ambulatory Visit: Payer: Self-pay

## 2019-12-12 DIAGNOSIS — M79609 Pain in unspecified limb: Secondary | ICD-10-CM | POA: Diagnosis not present

## 2019-12-12 DIAGNOSIS — R600 Localized edema: Secondary | ICD-10-CM | POA: Diagnosis not present

## 2019-12-12 DIAGNOSIS — I739 Peripheral vascular disease, unspecified: Secondary | ICD-10-CM | POA: Diagnosis not present

## 2019-12-12 DIAGNOSIS — S3992XA Unspecified injury of lower back, initial encounter: Secondary | ICD-10-CM | POA: Diagnosis not present

## 2019-12-12 DIAGNOSIS — Z96653 Presence of artificial knee joint, bilateral: Secondary | ICD-10-CM | POA: Insufficient documentation

## 2019-12-12 DIAGNOSIS — R609 Edema, unspecified: Secondary | ICD-10-CM | POA: Diagnosis present

## 2019-12-12 DIAGNOSIS — Z794 Long term (current) use of insulin: Secondary | ICD-10-CM | POA: Insufficient documentation

## 2019-12-12 DIAGNOSIS — I10 Essential (primary) hypertension: Secondary | ICD-10-CM | POA: Insufficient documentation

## 2019-12-12 DIAGNOSIS — R531 Weakness: Secondary | ICD-10-CM | POA: Insufficient documentation

## 2019-12-12 DIAGNOSIS — I890 Lymphedema, not elsewhere classified: Secondary | ICD-10-CM | POA: Diagnosis not present

## 2019-12-12 DIAGNOSIS — E119 Type 2 diabetes mellitus without complications: Secondary | ICD-10-CM | POA: Insufficient documentation

## 2019-12-12 LAB — CBC WITH DIFFERENTIAL/PLATELET
Abs Immature Granulocytes: 0.04 10*3/uL (ref 0.00–0.07)
Basophils Absolute: 0 10*3/uL (ref 0.0–0.1)
Basophils Relative: 0 %
Eosinophils Absolute: 0 10*3/uL (ref 0.0–0.5)
Eosinophils Relative: 0 %
HCT: 41.5 % (ref 39.0–52.0)
Hemoglobin: 13.3 g/dL (ref 13.0–17.0)
Immature Granulocytes: 1 %
Lymphocytes Relative: 11 %
Lymphs Abs: 0.7 10*3/uL (ref 0.7–4.0)
MCH: 30 pg (ref 26.0–34.0)
MCHC: 32 g/dL (ref 30.0–36.0)
MCV: 93.5 fL (ref 80.0–100.0)
Monocytes Absolute: 0.8 10*3/uL (ref 0.1–1.0)
Monocytes Relative: 13 %
Neutro Abs: 5 10*3/uL (ref 1.7–7.7)
Neutrophils Relative %: 75 %
Platelets: 265 10*3/uL (ref 150–400)
RBC: 4.44 MIL/uL (ref 4.22–5.81)
RDW: 15.5 % (ref 11.5–15.5)
WBC: 6.6 10*3/uL (ref 4.0–10.5)
nRBC: 0 % (ref 0.0–0.2)

## 2019-12-12 LAB — BASIC METABOLIC PANEL
Anion gap: 14 (ref 5–15)
BUN: 21 mg/dL (ref 8–23)
CO2: 23 mmol/L (ref 22–32)
Calcium: 9.6 mg/dL (ref 8.9–10.3)
Chloride: 101 mmol/L (ref 98–111)
Creatinine, Ser: 0.85 mg/dL (ref 0.61–1.24)
GFR calc Af Amer: >60 mL/min (ref >60)
GFR calc non Af Amer: >60 mL/min (ref >60)
Glucose, Bld: 205 mg/dL — ABNORMAL HIGH (ref 70–99)
Potassium: 4.6 mmol/L (ref 3.5–5.1)
Sodium: 138 mmol/L (ref 135–145)

## 2019-12-12 LAB — BRAIN NATRIURETIC PEPTIDE: B Natriuretic Peptide: 114.8 pg/mL — ABNORMAL HIGH (ref 0.0–100.0)

## 2019-12-12 MED ORDER — ACETAMINOPHEN 325 MG PO TABS
650.0000 mg | ORAL_TABLET | Freq: Once | ORAL | Status: AC
  Administered 2019-12-12: 14:00:00 650 mg via ORAL
  Filled 2019-12-12: qty 2

## 2019-12-12 NOTE — ED Triage Notes (Signed)
Pt arrives to ED with [facility] with complaints of increased weakness starting this morning and increased pain and swelling in the patient right leg. Patient states he lives alone and is unable to get to his walker. No CP, SOB, or other neuro deficits.

## 2019-12-12 NOTE — ED Provider Notes (Signed)
Physical Exam  BP (!) 156/88   Pulse 87   Temp 98.1 F (36.7 C) (Oral)   Resp 17   SpO2 97%   Physical Exam Vitals and nursing note reviewed.  Constitutional:      General: He is not in acute distress.    Appearance: He is well-developed. He is not diaphoretic.  HENT:     Head: Normocephalic and atraumatic.  Eyes:     General: No scleral icterus.    Conjunctiva/sclera: Conjunctivae normal.  Pulmonary:     Effort: Pulmonary effort is normal. No respiratory distress.  Musculoskeletal:     Cervical back: Normal range of motion.  Skin:    Findings: No rash.  Neurological:     Mental Status: He is alert.     ED Course/Procedures   Clinical Course as of Dec 11 1756  Tue Dec 12, 2019  1173 86 year old male with significant peripheral edema here with decreased ability to transfer from the chair that has been.  Baseline is ambulatory with walker.  Generally weak and increased pain in right leg.  Marked peripheral edema.  Getting labs chest x-ray and duplex lower extremity.   [MB]    Clinical Course User Index [MB] Terrilee Files, MD    Procedures  MDM   Care of patient assumed from PA Harris at 3:30 PM.  Agree with history, physical exam and plan.  See their note for further details.  Briefly, 86 y.o. male with PMH/PSH as below who presents with a chief complaint of right leg pain.  Daughter states that patient has chronic mobility issues secondary to bilateral lower extremity lymphedema.  Usually ambulates with a walker.  1 week ago slid out of his recliner onto the floor and has had worsening right leg pain since then.  Pain worsened today.  Patient has lymphedema noted on exam.  Denies any other complaints including no chest pain, back pain or shortness of breath.  Lab work including CBC, metabolic panel and BNP unremarkable.  Chest x-ray is unremarkable.  Past Medical History:  Diagnosis Date  . AAA (abdominal aortic aneurysm) (HCC)   . Arthritis   . BPH (benign  prostatic hypertrophy)   . Cataract   . Diabetes mellitus   . Dyslipidemia   . Hypertension    Dr. Romilda Joy,   . Neuromuscular disorder (HCC)    carpal tunnel in right wrist  . No blood products   . Refusal of blood transfusions as patient is Jehovah's Witness    Past Surgical History:  Procedure Laterality Date  . CARPAL TUNNEL RELEASE    . CHOLECYSTECTOMY    . COLONOSCOPY  2008  . EYE SURGERY     bilat cataract   . FALSE ANEURYSM REPAIR  07/16/2012   Procedure: REPAIR FALSE ANEURYSM;  Surgeon: Larina Earthly, MD;  Location: [facility] OR;  Service: Vascular;  Laterality: Bilateral;  false femoral aneurysm repair with femoral artery repair  . GAS/FLUID EXCHANGE  01/28/2012   Procedure: GAS/FLUID EXCHANGE;  Surgeon: Sherrie George, MD;  Location: [facility] OR;  Service: Ophthalmology;  Laterality: Right;  . I & D EXTREMITY  09/23/2012   Procedure: IRRIGATION AND DEBRIDEMENT EXTREMITY;  Surgeon: Larina Earthly, MD;  Location: [facility] OR;  Service: Vascular;  Laterality: Right;  Irrigation and Debridement prepubic and right groin wound  . PARS PLANA VITRECTOMY W/ REPAIR OF MACULAR HOLE  01/2012  . PARS PLANA VITRECTOMY W/ SCLERAL BUCKLE  01/28/2012   Procedure: PARS PLANA VITRECTOMY WITH  LASER FOR MACULAR HOLE;  Surgeon: Sherrie George, MD;  Location: [facility] OR;  Service: Ophthalmology;  Laterality: Right;  25 gauge ppv right eye   . SIGMOIDOSCOPY    . TOTAL KNEE ARTHROPLASTY  2009   Left      Current Plan: Obtain right lower extremity ultrasound to rule out DVT as well as x-ray of the lumbar spine. If negative, patient will be discharged home.  May need to consider home health needs or potential SNF placement as patient resides by himself.   MDM/ED Course: 3:57 PM X-ray of the lumbar spine any acute abnormalities.  Ultrasound is negative for DVT.  I spoke to patient and daughter at the bedside.  Daughter states that patient is not interested in SNF placement but they agree to have PT/OT evaluation and  potential home health. No need for medical admission at this time. PT/OT consult has been ordered and pending.  5:45 PM  Patient evaluated by PT/OT who recommends SNF placement.  I spoke to the family and patient again who again reiterate that they are not interested in facility placement.  Instead I will consult transitions of care unit as well as place an order for home health evaluation.  Patient and daughter agreeable to this plan.  Will discharge home for PCP follow-up.   Consults: PT/OT, SW/CM   Significant labs/images: DG Lumbar Spine Complete  Result Date: 12/12/2019 CLINICAL DATA:  Fall with weakness EXAM: LUMBAR SPINE - COMPLETE 4+ VIEW COMPARISON:  10/20/2006 FINDINGS: Metallic foreign body over the right iliac bone again noted. Five non rib-bearing lumbar type vertebra. Sagittal alignment is within normal limits. Vertebral body heights are maintained. Moderate diffuse degenerative change with diffuse disc space narrowing and bulky anterior osteophytes. Aortic atherosclerosis. IMPRESSION: Moderate diffuse degenerative changes. No acute osseous abnormality. Electronically Signed   By: Jasmine Pang M.D.   On: 12/12/2019 15:07   DG Chest Port 1 View  Result Date: 12/12/2019 CLINICAL DATA:  Worsening weakness and lower extremity swelling. EXAM: PORTABLE CHEST 1 VIEW COMPARISON:  04/30/2016 FINDINGS: Heart size is normal. There is aortic atherosclerotic calcification. Pulmonary vascularity is normal. No effusions. Both lungs are clear. The visualized skeletal structures are unremarkable. IMPRESSION: No active disease. Electronically Signed   By: Paulina Fusi M.D.   On: 12/12/2019 14:41   VAS [place] LOWER EXTREMITY VENOUS (DVT) (ONLY [facility] & [facility])  Result Date: 12/12/2019  Lower Venous DVTStudy Indications: Swelling, and Edema.  Performing Technologist: Blanch Media RVS  Examination Guidelines: A complete evaluation includes B-mode imaging, spectral Doppler, color Doppler, and power Doppler as  needed of all accessible portions of each vessel. Bilateral testing is considered an integral part of a complete examination. Limited examinations for reoccurring indications may be performed as noted. The reflux portion of the exam is performed with the patient in reverse Trendelenburg.  +---------+---------------+---------+-----------+----------+--------------+ RIGHT    CompressibilityPhasicitySpontaneityPropertiesThrombus Aging +---------+---------------+---------+-----------+----------+--------------+ CFV      Full           Yes      Yes                                 +---------+---------------+---------+-----------+----------+--------------+ SFJ      Full                                                        +---------+---------------+---------+-----------+----------+--------------+  FV Prox  Full                                                        +---------+---------------+---------+-----------+----------+--------------+ FV Mid   Full                                                        +---------+---------------+---------+-----------+----------+--------------+ FV DistalFull                                                        +---------+---------------+---------+-----------+----------+--------------+ PFV      Full                                                        +---------+---------------+---------+-----------+----------+--------------+ POP      Full           Yes      Yes                                 +---------+---------------+---------+-----------+----------+--------------+ PTV      Full                                                        +---------+---------------+---------+-----------+----------+--------------+ PERO     Full                                                        +---------+---------------+---------+-----------+----------+--------------+    +----+---------------+---------+-----------+----------+--------------+ LEFTCompressibilityPhasicitySpontaneityPropertiesThrombus Aging +----+---------------+---------+-----------+----------+--------------+ CFV Full           Yes      Yes                                 +----+---------------+---------+-----------+----------+--------------+     Summary: RIGHT: - There is no evidence of deep vein thrombosis in the lower extremity.  - No cystic structure found in the popliteal fossa.  LEFT: - No evidence of common femoral vein obstruction.  *See table(s) above for measurements and observations. Electronically signed by Deitra Mayo MD on 12/12/2019 at 4:56:55 PM.    Final      I personally reviewed and interpreted all labs.  The plan for this patient was discussed with Dr. Rex Kras, who voiced agreement and who oversaw evaluation and treatment of this patient.     Delia Heady, PA-C 12/12/19 1758    Little, Wenda Overland, MD 12/13/19 1537

## 2019-12-12 NOTE — ED Provider Notes (Signed)
Joe Barry Provider Note   CSN: [id number] Arrival date & time: 12/12/19  1216     History Chief Complaint  Patient presents with  . Weakness  . Leg Swelling    Joe Barry is a 86 y.o. male with a pmh of AAA, arthritis, hypertension, hyperlipidemia, chronic lymphedema of the lower extremities, PVD, bilateral osteoarthritis status post left total knee arthroplasty and diabetes who presents emergency Barry with a cc of r leg pain.  History given by the patient and his daughter who is at bedside.  The patient has chronic mobility issues secondary to severe bilateral lower extremity lymphedema which is chronic.  He lives alone.  Normally he is ambulatory with a walker.  1 week ago patient slid out of his recliner onto the floor and was unable to get up after the fall and had to scoot around to get to the phone and get someone to come and help him.  Since that time he has had to use his wheelchair more frequently.  His daughter states that yesterday he spent the day sitting in his wheelchair and using his legs to scoot himself around.  Today the patient was unable to get himself up out of his recliner because "my leg just would not work."  He complains of pain in his right leg.  His daughter believes it is secondary to him scooting around in his wheelchair. He denies back pain., chest pain, sob..   .HPI     Past Medical History:  Diagnosis Date  . AAA (abdominal aortic aneurysm) ([place])   . Arthritis   . BPH (benign prostatic hypertrophy)   . Cataract   . Diabetes mellitus   . Dyslipidemia   . Hypertension    Dr. Matthew Folks,   . Neuromuscular disorder ([place])    carpal tunnel in right wrist  . No blood products   . Refusal of blood transfusions as patient is Jehovah's Witness     Patient Active Problem List   Diagnosis Date Noted  . Lymphedema 04/18/2018  . Obesity (BMI 30-39.9) 04/18/2018  . Onychomycosis 04/03/2016  . Arthritis  associated with diabetes ([place]) 05/09/2014  . PVD (peripheral vascular disease) ([place]) 10/18/2012  . False aneurysm ([place]) 07/26/2012  . Hypertension associated with diabetes ([place]) 01/21/2012  . Type 2 diabetes mellitus with complications ([place]) 47/05/6282  . Hyperlipidemia associated with type 2 diabetes mellitus ([place]) 01/21/2012  . Macular hole of right eye 12/30/2011    Past Surgical History:  Procedure Laterality Date  . CARPAL TUNNEL RELEASE    . CHOLECYSTECTOMY    . COLONOSCOPY  2008  . EYE SURGERY     bilat cataract   . FALSE ANEURYSM REPAIR  07/16/2012   Procedure: REPAIR FALSE ANEURYSM;  Surgeon: Rosetta Posner, MD;  Location: [facility] OR;  Service: Vascular;  Laterality: Bilateral;  false femoral aneurysm repair with femoral artery repair  . GAS/FLUID EXCHANGE  01/28/2012   Procedure: GAS/FLUID EXCHANGE;  Surgeon: Hayden Pedro, MD;  Location: [facility];  Service: Ophthalmology;  Laterality: Right;  . I & D EXTREMITY  09/23/2012   Procedure: IRRIGATION AND DEBRIDEMENT EXTREMITY;  Surgeon: Rosetta Posner, MD;  Location: [facility] OR;  Service: Vascular;  Laterality: Right;  Irrigation and Debridement prepubic and right groin wound  . PARS PLANA VITRECTOMY W/ REPAIR OF MACULAR HOLE  01/2012  . PARS PLANA VITRECTOMY W/ SCLERAL BUCKLE  01/28/2012   Procedure: PARS PLANA VITRECTOMY WITH LASER FOR MACULAR HOLE;  Surgeon: Hayden Pedro, MD;  Location: [facility];  Service: Ophthalmology;  Laterality: Right;  25 gauge ppv right eye   . SIGMOIDOSCOPY    . TOTAL KNEE ARTHROPLASTY  2009   Left       Family History  Problem Relation Age of Onset  . Cancer Mother        ovarian  . Diabetes Father     Social History   Tobacco Use  . Smoking status: Former Smoker    Types: Cigarettes    Quit date: 07/26/1972    Years since quitting: 47.4  . Smokeless tobacco: Never Used  Substance Use Topics  . Alcohol use: Yes    Alcohol/week: 3.0 standard drinks    Types: 3 Shots of liquor per week    Comment: every  day or so  . Drug use: No    Home Medications Prior to Admission medications   Medication Sig Start Date End Date Taking? Authorizing Provider  acetaminophen (TYLENOL) 500 MG tablet Take 500 mg by mouth every 6 (six) hours as needed for moderate pain.   Yes [provider]  amLODipine (NORVASC) 10 MG tablet TAKE 1 TABLET BY MOUTH DAILY 12/05/19  Yes Denita Lung, MD  COMBIGAN 0.2-0.5 % ophthalmic solution Place 1 drop into both eyes every 12 (twelve) hours.  03/11/17  Yes [provider]  empagliflozin (JARDIANCE) 10 MG TABS tablet Take 10 mg by mouth daily before breakfast. 07/23/19  Yes Denita Lung, MD  hydrochlorothiazide (MICROZIDE) 12.5 MG capsule Take 1 capsule (12.5 mg total) by mouth daily. 07/03/19  Yes Denita Lung, MD  lovastatin (MEVACOR) 40 MG tablet Take 1 tablet (40 mg total) by mouth at bedtime. 07/03/19  Yes Denita Lung, MD  LUMIGAN 0.01 % SOLN Place 1 drop into both eyes at bedtime.  03/11/17  Yes [provider]  olmesartan (BENICAR) 5 MG tablet Take 1 tablet (5 mg total) by mouth daily. 07/03/19  Yes Denita Lung, MD  pioglitazone-metformin (ACTOPLUS MET) [phone] MG tablet TAKE 1 TABLET BY MOUTH TWICE DAILY WITH MEALS 12/05/19  Yes Denita Lung, MD  sitaGLIPtin (JANUVIA) 50 MG tablet Take 1 tablet (50 mg total) by mouth daily. 07/03/19  Yes Denita Lung, MD  amLODipine (NORVASC) 10 MG tablet Take 1 tablet (10 mg total) by mouth daily. 06/17/17   Denita Lung, MD  amLODipine (NORVASC) 10 MG tablet TAKE 1 TABLET BY MOUTH DAILY Patient not taking: Reported on 12/12/2019 05/30/18   Denita Lung, MD  Blood Glucose Monitoring Suppl (FORA V12 BLOOD GLUCOSE SYSTEM) DEVI Testing once a day 08/31/12   [provider]  colchicine 0.6 MG tablet Take 0.6 mg by mouth daily as needed. For gout 05/05/12 04/03/16  Denita Lung, MD  diclofenac (VOLTAREN) 75 MG EC tablet Take 1 tablet (75 mg total) by mouth 2 (two) times daily. Patient  not taking: Reported on 09/28/2017 06/17/17   Denita Lung, MD  glucose blood (ACCU-CHEK GUIDE) test strip Check glucose BID 07/03/19   Denita Lung, MD  HYDROcodone-acetaminophen (NORCO) 5-325 MG tablet Take 1 tablet by mouth every 6 (six) hours as needed for moderate pain. Patient not taking: Reported on 04/18/2018 06/17/17   Denita Lung, MD  Insulin Glargine (LANTUS SOLOSTAR) 100 UNIT/ML Solostar Pen Inject 20 Units into the skin daily at 10 pm. Patient not taking: Reported on 04/18/2018 06/17/17   Denita Lung, MD  Lancet Devices (LITE TOUCH LANCING  DEVICE) MISC  08/31/12   [provider]  pioglitazone-metformin (ACTOPLUS MET) 15-850 MG tablet Take 1 tablet by mouth 2 (two) times daily with a meal. 06/17/17   Denita Lung, MD  pioglitazone-metformin (ACTOPLUS MET) [phone] MG tablet TAKE 1 TABLET BY MOUTH TWICE DAILY WITH MEALS 07/18/18   Denita Lung, MD  traMADol (ULTRAM) 50 MG tablet TAKE 1 TABLET BY MOUTH EVERY 8 HOURS AS NEEDED FOR PAIN. Patient not taking: Reported on 06/17/2017 02/06/14   Denita Lung, MD    Allergies    Patient has no known allergies.  Review of Systems   Review of Systems Ten systems reviewed and are negative for acute change, except as noted in the HPI.   Physical Exam Updated Vital Signs BP (!) 176/90 (BP Location: Right Arm)   Pulse 80   Temp 98.1 F (36.7 C) (Oral)   Resp 20   SpO2 96%   Physical Exam Vitals and nursing note reviewed.  Constitutional:      General: He is not in acute distress.    Appearance: He is well-developed. He is not diaphoretic.  HENT:     Head: Normocephalic and atraumatic.  Eyes:     General: No scleral icterus.    Conjunctiva/sclera: Conjunctivae normal.  Cardiovascular:     Rate and Rhythm: Normal rate and regular rhythm.     Heart sounds: Normal heart sounds.  Pulmonary:     Effort: Pulmonary effort is normal. No respiratory distress.     Breath sounds: Normal breath sounds.  Abdominal:      Palpations: Abdomen is soft.     Tenderness: There is no abdominal tenderness.  Musculoskeletal:     Cervical back: Normal range of motion and neck supple.     Right lower leg: Edema present.     Left lower leg: Edema present.  Skin:    General: Skin is warm and dry.  Neurological:     Mental Status: He is alert.  Psychiatric:        Behavior: Behavior normal.     ED Results / Procedures / Treatments   Labs (all labs ordered are listed, but only abnormal results are displayed) Labs Reviewed  BASIC METABOLIC PANEL - Abnormal; Notable for the following components:      Result Value   Glucose, Bld 205 (*)    All other components within normal limits  BRAIN NATRIURETIC PEPTIDE - Abnormal; Notable for the following components:   B Natriuretic Peptide 114.8 (*)    All other components within normal limits  CBC WITH DIFFERENTIAL/PLATELET    EKG EKG Interpretation  Date/Time:  Tuesday December 12 2019 13:55:39 EDT Ventricular Rate:  87 PR Interval:    QRS Duration: 83 QT Interval:  343 QTC Calculation: 413 R Axis:   55 Text Interpretation: sinus rhythm Anterior infarct, old similar to prior 5/13 Confirmed by Aletta Edouard [phone]) on 12/12/2019 2:01:27 PM   Radiology DG Lumbar Spine Complete  Result Date: 12/12/2019 CLINICAL DATA:  Fall with weakness EXAM: LUMBAR SPINE - COMPLETE 4+ VIEW COMPARISON:  56/21/3086 FINDINGS: Metallic foreign body over the right iliac bone again noted. Five non rib-bearing lumbar type vertebra. Sagittal alignment is within normal limits. Vertebral body heights are maintained. Moderate diffuse degenerative change with diffuse disc space narrowing and bulky anterior osteophytes. Aortic atherosclerosis. IMPRESSION: Moderate diffuse degenerative changes. No acute osseous abnormality. Electronically Signed   By: Donavan Foil M.D.   On: 12/12/2019 15:07   DG Chest [facility]  1 View  Result Date: 12/12/2019 CLINICAL DATA:  Worsening weakness and lower extremity  swelling. EXAM: PORTABLE CHEST 1 VIEW COMPARISON:  04/30/2016 FINDINGS: Heart size is normal. There is aortic atherosclerotic calcification. Pulmonary vascularity is normal. No effusions. Both lungs are clear. The visualized skeletal structures are unremarkable. IMPRESSION: No active disease. Electronically Signed   By: Nelson Chimes M.D.   On: 12/12/2019 14:41   VAS [place] LOWER EXTREMITY VENOUS (DVT) (ONLY [facility] & [facility])  Result Date: 12/12/2019  Lower Venous DVTStudy Indications: Swelling, and Edema.  Performing Technologist: Abram Sander RVS  Examination Guidelines: A complete evaluation includes B-mode imaging, spectral Doppler, color Doppler, and power Doppler as needed of all accessible portions of each vessel. Bilateral testing is considered an integral part of a complete examination. Limited examinations for reoccurring indications may be performed as noted. The reflux portion of the exam is performed with the patient in reverse Trendelenburg.  +---------+---------------+---------+-----------+----------+--------------+ RIGHT    CompressibilityPhasicitySpontaneityPropertiesThrombus Aging +---------+---------------+---------+-----------+----------+--------------+ CFV      Full           Yes      Yes                                 +---------+---------------+---------+-----------+----------+--------------+ SFJ      Full                                                        +---------+---------------+---------+-----------+----------+--------------+ FV Prox  Full                                                        +---------+---------------+---------+-----------+----------+--------------+ FV Mid   Full                                                        +---------+---------------+---------+-----------+----------+--------------+ FV DistalFull                                                        +---------+---------------+---------+-----------+----------+--------------+  PFV      Full                                                        +---------+---------------+---------+-----------+----------+--------------+ POP      Full           Yes      Yes                                 +---------+---------------+---------+-----------+----------+--------------+ PTV      Full                                                        +---------+---------------+---------+-----------+----------+--------------+  PERO     Full                                                        +---------+---------------+---------+-----------+----------+--------------+   +----+---------------+---------+-----------+----------+--------------+ LEFTCompressibilityPhasicitySpontaneityPropertiesThrombus Aging +----+---------------+---------+-----------+----------+--------------+ CFV Full           Yes      Yes                                 +----+---------------+---------+-----------+----------+--------------+     Summary: RIGHT: - There is no evidence of deep vein thrombosis in the lower extremity.  - No cystic structure found in the popliteal fossa.  LEFT: - No evidence of common femoral vein obstruction.  *See table(s) above for measurements and observations. Electronically signed by Deitra Mayo MD on 12/12/2019 at 4:56:55 PM.    Final     Procedures Procedures (including critical care time)  Medications Ordered in ED Medications  acetaminophen (TYLENOL) tablet 650 mg (650 mg Oral Given 12/12/19 1407)    ED Course  I have reviewed the triage vital signs and the nursing notes.  Pertinent labs & imaging results that were available during my care of the patient were reviewed by me and considered in my medical decision making (see chart for details).  Clinical Course as of Dec 12 712  Tue Dec 12, 4239  7410 86 year old male with significant peripheral edema here with decreased ability to transfer from the chair that has been.  Baseline is ambulatory  with walker.  Generally weak and increased pain in right leg.  Marked peripheral edema.  Getting labs chest x-ray and duplex lower extremity.   [MB]    Clinical Course User Index [MB] Hayden Rasmussen, MD   MDM Rules/Calculators/A&P                     86 year old male here having difficulty standing out of his recliner.  He has chronic heavy lymphedema in the bilateral lower extremities.  Chronic knee pain and status post left TKA.  The differential diagnosis of weakness includes but is not limited to neurologic causes (GBS, myasthenia gravis, CVA, MS, ALS, transverse myelitis, spinal cord injury, CVA, botulism, ) and other causes: ACS, Arrhythmia, syncope, orthostatic hypotension, sepsis, hypoglycemia, electrolyte disturbance, hypothyroidism, respiratory failure, symptomatic anemia, dehydration, heat injury, polypharmacy, malignancy.  I suspect that this is musculoskeletal as patient was exerting himself with his legs.  Doubt rhabdo myelitis, myelopathy or radiculopathy.  Patient is tender along the posterior aspect of his legs which would certainly be indicative of muscle strain given he was using his legs to mobilize and pull himself around in his wheelchair yesterday.  I have a lower extremity venous Doppler pending.  Lumbar spine film pending.  Chest x-ray shows no acute abnormalities on my interpretation I have given signout to [facility] to assume care.  I suspect the patient will need PT eval and case management or social work intervention.  Final Clinical Impression(s) / ED Diagnoses Final diagnoses:  Chronic acquired lymphedema    Rx / DC Orders ED Discharge Orders    None       Margarita Mail, PA-C 12/13/19 [id number]    Hayden Rasmussen, MD 12/13/19 [phone]

## 2019-12-12 NOTE — Evaluation (Signed)
Physical Therapy Evaluation Patient Details Name: Joe Barry MRN: [id number] DOB: [birth year] Today's Date: 12/12/2019   History of Present Illness  Pt is an 86 y/o male presenting to the ED secondary to weakness and pain in RLE. Imaging of lumbar spine negative. Negative for DVT as well. PMH includes HTN, lymphedema of BLE, PVD, L TKA, and DM.   Clinical Impression  Pt presenting to ED secondary to problem above with deficits below. Pt requiring max A to sit at edge of stretcher this session. Very weak throughout LEs, especially RLE. Pt currently lives alone and feel he is at increased risk for falls. Feel he would benefit from SNF level therapies at d/c to increase independence and safety prior to return home. Will continue to follow acutely to maximize functional mobility independence and safety.     Follow Up Recommendations SNF;Supervision/Assistance - 24 hour    Equipment Recommendations  (hoyer lift and pad)    Recommendations for Other Services       Precautions / Restrictions Precautions Precautions: Fall Restrictions Weight Bearing Restrictions: No      Mobility  Bed Mobility Overal bed mobility: Needs Assistance Bed Mobility: Supine to Sit;Sit to Supine     Supine to sit: Max assist Sit to supine: Max assist   General bed mobility comments: Max A for LE and trunk assist throughout bed mobility. Increased time required to perform.  Unsafe to stand from higher stretcher height given deficits with +1 assist so further mobility deferred.   Transfers                    Ambulation/Gait                Stairs            Wheelchair Mobility    Modified Rankin (Stroke Patients Only)       Balance Overall balance assessment: Needs assistance Sitting-balance support: Bilateral upper extremity supported;Feet supported Sitting balance-Leahy Scale: Poor Sitting balance - Comments: Reliant on BUE support                                      Pertinent Vitals/Pain Pain Assessment: Faces Faces Pain Scale: Hurts even more Pain Location: R knee Pain Descriptors / Indicators: Guarding;Grimacing Pain Intervention(s): Limited activity within patient's tolerance;Monitored during session;Repositioned    Home Living Family/patient expects to be discharged to:: Private residence Living Arrangements: Alone Available Help at Discharge: Family;Available PRN/intermittently Type of Home: House Home Access: Stairs to enter   Entrance Stairs-Number of Steps: 1 Home Layout: One level Home Equipment: Walker - 2 wheels;Bedside commode;Wheelchair - manual      Prior Function Level of Independence: Independent with assistive device(s)         Comments: Has been using WC for mobility. Reports he was sponge bathing.      Hand Dominance        Extremity/Trunk Assessment   Upper Extremity Assessment Upper Extremity Assessment: Generalized weakness    Lower Extremity Assessment Lower Extremity Assessment: LLE deficits/detail;RLE deficits/detail RLE Deficits / Details: 2/5 throughout RLE. Reports pain in R knee as well.  LLE Deficits / Details: Grossly 3/5 throughout.     Cervical / Trunk Assessment Cervical / Trunk Assessment: Kyphotic  Communication   Communication: No difficulties  Cognition Arousal/Alertness: Awake/alert Behavior During Therapy: WFL for tasks assessed/performed Overall Cognitive Status: No family/caregiver present to determine baseline cognitive  functioning                                        General Comments      Exercises     Assessment/Plan    PT Assessment Patient needs continued PT services  PT Problem List Decreased strength;Decreased activity tolerance;Decreased balance;Decreased mobility;Decreased knowledge of use of DME;Decreased knowledge of precautions       PT Treatment Interventions Gait training;DME instruction;Functional mobility  training;Therapeutic activities;Therapeutic exercise;Balance training;Wheelchair mobility training;Patient/family education    PT Goals (Current goals can be found in the Care Plan section)  Acute Rehab PT Goals Patient Stated Goal: to go home PT Goal Formulation: With patient Time For Goal Achievement: 12/26/19 Potential to Achieve Goals: Fair    Frequency Min 2X/week   Barriers to discharge Decreased caregiver support      Co-evaluation               AM-PAC PT "6 Clicks" Mobility  Outcome Measure Help needed turning from your back to your side while in a flat bed without using bedrails?: A Lot Help needed moving from lying on your back to sitting on the side of a flat bed without using bedrails?: Total Help needed moving to and from a bed to a chair (including a wheelchair)?: Total Help needed standing up from a chair using your arms (e.g., wheelchair or bedside chair)?: Total Help needed to walk in hospital room?: Total Help needed climbing 3-5 steps with a railing? : Total 6 Click Score: 7    End of Session   Activity Tolerance: Patient tolerated treatment well Patient left: in bed;with call bell/phone within reach Nurse Communication: Mobility status PT Visit Diagnosis: Muscle weakness (generalized) (M62.81);Difficulty in walking, not elsewhere classified (R26.2)    Time: 1884-1660 PT Time Calculation (min) (ACUTE ONLY): 24 min   Charges:   PT Evaluation $PT Eval Moderate Complexity: 1 Mod PT Treatments $Therapeutic Activity: 8-22 mins        Lou Miner, DPT  Acute Rehabilitation Services  Pager: [phone] Office: [phone]   Rudean Hitt 12/12/2019, 5:02 PM

## 2019-12-12 NOTE — Progress Notes (Signed)
Lower extremity venous has been completed.   Preliminary results in CV Proc.   Blanch Media 12/12/2019 3:28 PM

## 2019-12-12 NOTE — Discharge Instructions (Addendum)
Continue home medications as previously prescribed. The case manager and social worker will contact you regarding home health. Return to the ER for any worsening symptoms, injuries or falls, numbness in arms or legs, chest pain or shortness of breath.

## 2019-12-12 NOTE — Care Management (Signed)
Patient was discharged from the ED prior the [facility] team being notified of [facility] consult. TOC team will attempt to reach out to patient and family tomorrow 3/31 to address TOC needs.

## 2019-12-13 ENCOUNTER — Telehealth: Payer: Self-pay | Admitting: Surgery

## 2019-12-13 NOTE — Telephone Encounter (Signed)
ED CM reached out to  patient's daughter Joe Barry to discuss [facility] recommendations.  Daughter states she has spoken with [facility] concerning SNF placement and is working on placement from home.  No further ED CM needs identified.

## 2019-12-18 ENCOUNTER — Ambulatory Visit: Payer: Medicare PPO | Admitting: Family Medicine

## 2019-12-18 ENCOUNTER — Encounter: Payer: Self-pay | Admitting: Family Medicine

## 2019-12-18 ENCOUNTER — Other Ambulatory Visit: Payer: Self-pay

## 2019-12-18 VITALS — Wt 188.0 lb

## 2019-12-18 DIAGNOSIS — I890 Lymphedema, not elsewhere classified: Secondary | ICD-10-CM

## 2019-12-18 DIAGNOSIS — Z742 Need for assistance at home and no other household member able to render care: Secondary | ICD-10-CM | POA: Diagnosis not present

## 2019-12-18 DIAGNOSIS — E11618 Type 2 diabetes mellitus with other diabetic arthropathy: Secondary | ICD-10-CM

## 2019-12-18 NOTE — Progress Notes (Signed)
   Subjective:    Patient ID: Joe Barry, male    DOB: [birth year], 86 y.o.   MRN: [id number]  HPI Documentation for virtual audio and video telecommunications through WebEx encounter: The patient was located at home. The provider was located in the office. The patient did consent to this visit and is aware of possible charges through their insurance for this visit. The other persons participating in this telemedicine service were none. Time spent on call was 5 minutes and in review of previous records >14 minutes total. This virtual service is not related to other E/M service within previous 7 days. He was seen in the emergency room on March 30 for evaluation of right knee pain.  He had a previous left DKA and now the right knee is giving him trouble interfering with his ability to mobilize.  Evaluation in emergency room made sure no other major medical problems were manifesting and nothing was found.  It was recommended that he get further evaluation for outpatient PT and general rehab.  Presently he is having no difficulty except as above.  His daughter who is a nurse states his blood pressure and blood sugars are in a good range.  He continues have difficulty with lower extremity edema.  Review of Systems     Objective:   Physical Exam  Alert and in no distress otherwise not examined.  He appeared normal.      Assessment & Plan:  Arthritis associated with diabetes (HCC)  Need for home health care  Lymphedema I will set up for home health evaluation to determine exactly what his needs are.

## 2019-12-19 ENCOUNTER — Telehealth: Payer: Self-pay

## 2019-12-19 NOTE — Telephone Encounter (Signed)
Office was advised KH 

## 2019-12-19 NOTE — Telephone Encounter (Signed)
ok 

## 2019-12-19 NOTE — Telephone Encounter (Signed)
[facility] is requesting to have a verbal for skilled nursing and P/T for Mr. Joe [name] Please advise if this is ok. KH

## 2019-12-22 ENCOUNTER — Telehealth: Payer: Self-pay | Admitting: Family Medicine

## 2019-12-22 DIAGNOSIS — M1711 Unilateral primary osteoarthritis, right knee: Secondary | ICD-10-CM | POA: Diagnosis not present

## 2019-12-22 DIAGNOSIS — E1159 Type 2 diabetes mellitus with other circulatory complications: Secondary | ICD-10-CM | POA: Diagnosis not present

## 2019-12-22 DIAGNOSIS — E7849 Other hyperlipidemia: Secondary | ICD-10-CM | POA: Diagnosis not present

## 2019-12-22 DIAGNOSIS — I890 Lymphedema, not elsewhere classified: Secondary | ICD-10-CM | POA: Diagnosis not present

## 2019-12-22 DIAGNOSIS — G8929 Other chronic pain: Secondary | ICD-10-CM | POA: Diagnosis not present

## 2019-12-22 DIAGNOSIS — E1169 Type 2 diabetes mellitus with other specified complication: Secondary | ICD-10-CM | POA: Diagnosis not present

## 2019-12-22 DIAGNOSIS — E11618 Type 2 diabetes mellitus with other diabetic arthropathy: Secondary | ICD-10-CM | POA: Diagnosis not present

## 2019-12-22 DIAGNOSIS — I10 Essential (primary) hypertension: Secondary | ICD-10-CM | POA: Diagnosis not present

## 2019-12-22 DIAGNOSIS — E1151 Type 2 diabetes mellitus with diabetic peripheral angiopathy without gangrene: Secondary | ICD-10-CM | POA: Diagnosis not present

## 2019-12-22 NOTE — Telephone Encounter (Signed)
Lurline Del  With [id number] [phone]   Want orders for  PT 3 times week for 2 weeks And twice a week for 6 weeks   Also wants script for hospital bed   Fax script to  [phone]

## 2019-12-23 NOTE — Telephone Encounter (Signed)
ok 

## 2019-12-25 DIAGNOSIS — E1169 Type 2 diabetes mellitus with other specified complication: Secondary | ICD-10-CM | POA: Diagnosis not present

## 2019-12-25 DIAGNOSIS — I10 Essential (primary) hypertension: Secondary | ICD-10-CM | POA: Diagnosis not present

## 2019-12-25 DIAGNOSIS — I890 Lymphedema, not elsewhere classified: Secondary | ICD-10-CM | POA: Diagnosis not present

## 2019-12-25 DIAGNOSIS — E7849 Other hyperlipidemia: Secondary | ICD-10-CM | POA: Diagnosis not present

## 2019-12-25 DIAGNOSIS — G8929 Other chronic pain: Secondary | ICD-10-CM | POA: Diagnosis not present

## 2019-12-25 DIAGNOSIS — E11618 Type 2 diabetes mellitus with other diabetic arthropathy: Secondary | ICD-10-CM | POA: Diagnosis not present

## 2019-12-25 DIAGNOSIS — M1711 Unilateral primary osteoarthritis, right knee: Secondary | ICD-10-CM | POA: Diagnosis not present

## 2019-12-25 DIAGNOSIS — E1151 Type 2 diabetes mellitus with diabetic peripheral angiopathy without gangrene: Secondary | ICD-10-CM | POA: Diagnosis not present

## 2019-12-25 DIAGNOSIS — E1159 Type 2 diabetes mellitus with other circulatory complications: Secondary | ICD-10-CM | POA: Diagnosis not present

## 2019-12-26 NOTE — Telephone Encounter (Signed)
Lvm TO GIVE OK AND FAXED OVER SCRIPT FOR BED. kh

## 2019-12-27 DIAGNOSIS — E1151 Type 2 diabetes mellitus with diabetic peripheral angiopathy without gangrene: Secondary | ICD-10-CM | POA: Diagnosis not present

## 2019-12-27 DIAGNOSIS — G8929 Other chronic pain: Secondary | ICD-10-CM | POA: Diagnosis not present

## 2019-12-27 DIAGNOSIS — M1711 Unilateral primary osteoarthritis, right knee: Secondary | ICD-10-CM | POA: Diagnosis not present

## 2019-12-27 DIAGNOSIS — E1169 Type 2 diabetes mellitus with other specified complication: Secondary | ICD-10-CM | POA: Diagnosis not present

## 2019-12-27 DIAGNOSIS — I890 Lymphedema, not elsewhere classified: Secondary | ICD-10-CM | POA: Diagnosis not present

## 2019-12-27 DIAGNOSIS — E11618 Type 2 diabetes mellitus with other diabetic arthropathy: Secondary | ICD-10-CM | POA: Diagnosis not present

## 2019-12-27 DIAGNOSIS — I10 Essential (primary) hypertension: Secondary | ICD-10-CM | POA: Diagnosis not present

## 2019-12-27 DIAGNOSIS — E1159 Type 2 diabetes mellitus with other circulatory complications: Secondary | ICD-10-CM | POA: Diagnosis not present

## 2019-12-27 DIAGNOSIS — E7849 Other hyperlipidemia: Secondary | ICD-10-CM | POA: Diagnosis not present

## 2019-12-28 DIAGNOSIS — E1151 Type 2 diabetes mellitus with diabetic peripheral angiopathy without gangrene: Secondary | ICD-10-CM | POA: Diagnosis not present

## 2019-12-28 DIAGNOSIS — I890 Lymphedema, not elsewhere classified: Secondary | ICD-10-CM | POA: Diagnosis not present

## 2019-12-28 DIAGNOSIS — E1169 Type 2 diabetes mellitus with other specified complication: Secondary | ICD-10-CM | POA: Diagnosis not present

## 2019-12-28 DIAGNOSIS — M1711 Unilateral primary osteoarthritis, right knee: Secondary | ICD-10-CM | POA: Diagnosis not present

## 2019-12-28 DIAGNOSIS — G8929 Other chronic pain: Secondary | ICD-10-CM | POA: Diagnosis not present

## 2019-12-28 DIAGNOSIS — I10 Essential (primary) hypertension: Secondary | ICD-10-CM | POA: Diagnosis not present

## 2019-12-28 DIAGNOSIS — E11618 Type 2 diabetes mellitus with other diabetic arthropathy: Secondary | ICD-10-CM | POA: Diagnosis not present

## 2019-12-28 DIAGNOSIS — E1159 Type 2 diabetes mellitus with other circulatory complications: Secondary | ICD-10-CM | POA: Diagnosis not present

## 2019-12-28 DIAGNOSIS — E7849 Other hyperlipidemia: Secondary | ICD-10-CM | POA: Diagnosis not present

## 2019-12-28 NOTE — Telephone Encounter (Signed)
Spoke to pt nurse at [facility] was advised and the rep at their location will call back to advise next step. KH

## 2019-12-28 NOTE — Telephone Encounter (Signed)
[name] with [facility] called and states out of Medical beds.

## 2019-12-29 DIAGNOSIS — I10 Essential (primary) hypertension: Secondary | ICD-10-CM | POA: Diagnosis not present

## 2019-12-29 DIAGNOSIS — G8929 Other chronic pain: Secondary | ICD-10-CM | POA: Diagnosis not present

## 2019-12-29 DIAGNOSIS — E7849 Other hyperlipidemia: Secondary | ICD-10-CM | POA: Diagnosis not present

## 2019-12-29 DIAGNOSIS — I890 Lymphedema, not elsewhere classified: Secondary | ICD-10-CM | POA: Diagnosis not present

## 2019-12-29 DIAGNOSIS — E11618 Type 2 diabetes mellitus with other diabetic arthropathy: Secondary | ICD-10-CM | POA: Diagnosis not present

## 2019-12-29 DIAGNOSIS — E1169 Type 2 diabetes mellitus with other specified complication: Secondary | ICD-10-CM | POA: Diagnosis not present

## 2019-12-29 DIAGNOSIS — E1151 Type 2 diabetes mellitus with diabetic peripheral angiopathy without gangrene: Secondary | ICD-10-CM | POA: Diagnosis not present

## 2019-12-29 DIAGNOSIS — E1159 Type 2 diabetes mellitus with other circulatory complications: Secondary | ICD-10-CM | POA: Diagnosis not present

## 2019-12-29 DIAGNOSIS — M1711 Unilateral primary osteoarthritis, right knee: Secondary | ICD-10-CM | POA: Diagnosis not present

## 2020-01-01 ENCOUNTER — Telehealth: Payer: Self-pay | Admitting: Family Medicine

## 2020-01-01 DIAGNOSIS — I10 Essential (primary) hypertension: Secondary | ICD-10-CM | POA: Diagnosis not present

## 2020-01-01 DIAGNOSIS — G8929 Other chronic pain: Secondary | ICD-10-CM | POA: Diagnosis not present

## 2020-01-01 DIAGNOSIS — M1711 Unilateral primary osteoarthritis, right knee: Secondary | ICD-10-CM | POA: Diagnosis not present

## 2020-01-01 DIAGNOSIS — E1169 Type 2 diabetes mellitus with other specified complication: Secondary | ICD-10-CM | POA: Diagnosis not present

## 2020-01-01 DIAGNOSIS — E1159 Type 2 diabetes mellitus with other circulatory complications: Secondary | ICD-10-CM | POA: Diagnosis not present

## 2020-01-01 DIAGNOSIS — E7849 Other hyperlipidemia: Secondary | ICD-10-CM | POA: Diagnosis not present

## 2020-01-01 DIAGNOSIS — E1151 Type 2 diabetes mellitus with diabetic peripheral angiopathy without gangrene: Secondary | ICD-10-CM | POA: Diagnosis not present

## 2020-01-01 DIAGNOSIS — E11618 Type 2 diabetes mellitus with other diabetic arthropathy: Secondary | ICD-10-CM | POA: Diagnosis not present

## 2020-01-01 DIAGNOSIS — I890 Lymphedema, not elsewhere classified: Secondary | ICD-10-CM | POA: Diagnosis not present

## 2020-01-01 NOTE — Telephone Encounter (Signed)
Joe Barry with [facility] wants to know what is the plan for the hospital bed?  Please call him [phone].

## 2020-01-02 DIAGNOSIS — E11618 Type 2 diabetes mellitus with other diabetic arthropathy: Secondary | ICD-10-CM | POA: Diagnosis not present

## 2020-01-02 DIAGNOSIS — I890 Lymphedema, not elsewhere classified: Secondary | ICD-10-CM | POA: Diagnosis not present

## 2020-01-02 DIAGNOSIS — E1151 Type 2 diabetes mellitus with diabetic peripheral angiopathy without gangrene: Secondary | ICD-10-CM | POA: Diagnosis not present

## 2020-01-02 DIAGNOSIS — E1159 Type 2 diabetes mellitus with other circulatory complications: Secondary | ICD-10-CM | POA: Diagnosis not present

## 2020-01-02 DIAGNOSIS — I10 Essential (primary) hypertension: Secondary | ICD-10-CM | POA: Diagnosis not present

## 2020-01-02 DIAGNOSIS — M1711 Unilateral primary osteoarthritis, right knee: Secondary | ICD-10-CM | POA: Diagnosis not present

## 2020-01-02 DIAGNOSIS — E7849 Other hyperlipidemia: Secondary | ICD-10-CM | POA: Diagnosis not present

## 2020-01-02 DIAGNOSIS — E1169 Type 2 diabetes mellitus with other specified complication: Secondary | ICD-10-CM | POA: Diagnosis not present

## 2020-01-02 DIAGNOSIS — G8929 Other chronic pain: Secondary | ICD-10-CM | POA: Diagnosis not present

## 2020-01-02 NOTE — Telephone Encounter (Signed)
Soke to [employer] and he advised as long as they have a order they will try an other medical supply store to see if hospital bed can be ordered. Script was sent over to [facility] 12-27-19. KH

## 2020-01-03 DIAGNOSIS — M1711 Unilateral primary osteoarthritis, right knee: Secondary | ICD-10-CM | POA: Diagnosis not present

## 2020-01-03 DIAGNOSIS — E1151 Type 2 diabetes mellitus with diabetic peripheral angiopathy without gangrene: Secondary | ICD-10-CM | POA: Diagnosis not present

## 2020-01-03 DIAGNOSIS — G8929 Other chronic pain: Secondary | ICD-10-CM | POA: Diagnosis not present

## 2020-01-03 DIAGNOSIS — E11618 Type 2 diabetes mellitus with other diabetic arthropathy: Secondary | ICD-10-CM | POA: Diagnosis not present

## 2020-01-03 DIAGNOSIS — E1169 Type 2 diabetes mellitus with other specified complication: Secondary | ICD-10-CM | POA: Diagnosis not present

## 2020-01-03 DIAGNOSIS — I10 Essential (primary) hypertension: Secondary | ICD-10-CM | POA: Diagnosis not present

## 2020-01-03 DIAGNOSIS — E7849 Other hyperlipidemia: Secondary | ICD-10-CM | POA: Diagnosis not present

## 2020-01-03 DIAGNOSIS — E1159 Type 2 diabetes mellitus with other circulatory complications: Secondary | ICD-10-CM | POA: Diagnosis not present

## 2020-01-03 DIAGNOSIS — I890 Lymphedema, not elsewhere classified: Secondary | ICD-10-CM | POA: Diagnosis not present

## 2020-01-05 DIAGNOSIS — E7849 Other hyperlipidemia: Secondary | ICD-10-CM | POA: Diagnosis not present

## 2020-01-05 DIAGNOSIS — E1151 Type 2 diabetes mellitus with diabetic peripheral angiopathy without gangrene: Secondary | ICD-10-CM | POA: Diagnosis not present

## 2020-01-05 DIAGNOSIS — E1169 Type 2 diabetes mellitus with other specified complication: Secondary | ICD-10-CM | POA: Diagnosis not present

## 2020-01-05 DIAGNOSIS — G8929 Other chronic pain: Secondary | ICD-10-CM | POA: Diagnosis not present

## 2020-01-05 DIAGNOSIS — I890 Lymphedema, not elsewhere classified: Secondary | ICD-10-CM | POA: Diagnosis not present

## 2020-01-05 DIAGNOSIS — M1711 Unilateral primary osteoarthritis, right knee: Secondary | ICD-10-CM | POA: Diagnosis not present

## 2020-01-05 DIAGNOSIS — E1159 Type 2 diabetes mellitus with other circulatory complications: Secondary | ICD-10-CM | POA: Diagnosis not present

## 2020-01-05 DIAGNOSIS — E11618 Type 2 diabetes mellitus with other diabetic arthropathy: Secondary | ICD-10-CM | POA: Diagnosis not present

## 2020-01-05 DIAGNOSIS — I10 Essential (primary) hypertension: Secondary | ICD-10-CM | POA: Diagnosis not present

## 2020-01-09 DIAGNOSIS — E1151 Type 2 diabetes mellitus with diabetic peripheral angiopathy without gangrene: Secondary | ICD-10-CM | POA: Diagnosis not present

## 2020-01-09 DIAGNOSIS — E1169 Type 2 diabetes mellitus with other specified complication: Secondary | ICD-10-CM | POA: Diagnosis not present

## 2020-01-09 DIAGNOSIS — E1159 Type 2 diabetes mellitus with other circulatory complications: Secondary | ICD-10-CM | POA: Diagnosis not present

## 2020-01-09 DIAGNOSIS — M1711 Unilateral primary osteoarthritis, right knee: Secondary | ICD-10-CM | POA: Diagnosis not present

## 2020-01-09 DIAGNOSIS — E7849 Other hyperlipidemia: Secondary | ICD-10-CM | POA: Diagnosis not present

## 2020-01-09 DIAGNOSIS — I10 Essential (primary) hypertension: Secondary | ICD-10-CM | POA: Diagnosis not present

## 2020-01-09 DIAGNOSIS — I890 Lymphedema, not elsewhere classified: Secondary | ICD-10-CM | POA: Diagnosis not present

## 2020-01-09 DIAGNOSIS — E11618 Type 2 diabetes mellitus with other diabetic arthropathy: Secondary | ICD-10-CM | POA: Diagnosis not present

## 2020-01-09 DIAGNOSIS — G8929 Other chronic pain: Secondary | ICD-10-CM | POA: Diagnosis not present

## 2020-01-10 DIAGNOSIS — G8929 Other chronic pain: Secondary | ICD-10-CM | POA: Diagnosis not present

## 2020-01-10 DIAGNOSIS — M1711 Unilateral primary osteoarthritis, right knee: Secondary | ICD-10-CM | POA: Diagnosis not present

## 2020-01-10 DIAGNOSIS — E7849 Other hyperlipidemia: Secondary | ICD-10-CM | POA: Diagnosis not present

## 2020-01-10 DIAGNOSIS — I890 Lymphedema, not elsewhere classified: Secondary | ICD-10-CM | POA: Diagnosis not present

## 2020-01-10 DIAGNOSIS — E1169 Type 2 diabetes mellitus with other specified complication: Secondary | ICD-10-CM | POA: Diagnosis not present

## 2020-01-10 DIAGNOSIS — E1159 Type 2 diabetes mellitus with other circulatory complications: Secondary | ICD-10-CM | POA: Diagnosis not present

## 2020-01-10 DIAGNOSIS — I10 Essential (primary) hypertension: Secondary | ICD-10-CM | POA: Diagnosis not present

## 2020-01-10 DIAGNOSIS — E1151 Type 2 diabetes mellitus with diabetic peripheral angiopathy without gangrene: Secondary | ICD-10-CM | POA: Diagnosis not present

## 2020-01-10 DIAGNOSIS — E11618 Type 2 diabetes mellitus with other diabetic arthropathy: Secondary | ICD-10-CM | POA: Diagnosis not present

## 2020-01-11 ENCOUNTER — Telehealth: Payer: Self-pay

## 2020-01-11 NOTE — Telephone Encounter (Signed)
Pt daughter will call back for a face to face appt Pt  Has forms in the brown folder to help with this appt and appt desk will need to noted . Thanks [employer]

## 2020-01-12 DIAGNOSIS — E11618 Type 2 diabetes mellitus with other diabetic arthropathy: Secondary | ICD-10-CM | POA: Diagnosis not present

## 2020-01-12 DIAGNOSIS — E7849 Other hyperlipidemia: Secondary | ICD-10-CM | POA: Diagnosis not present

## 2020-01-12 DIAGNOSIS — I890 Lymphedema, not elsewhere classified: Secondary | ICD-10-CM | POA: Diagnosis not present

## 2020-01-12 DIAGNOSIS — E1169 Type 2 diabetes mellitus with other specified complication: Secondary | ICD-10-CM | POA: Diagnosis not present

## 2020-01-12 DIAGNOSIS — M1711 Unilateral primary osteoarthritis, right knee: Secondary | ICD-10-CM | POA: Diagnosis not present

## 2020-01-12 DIAGNOSIS — G8929 Other chronic pain: Secondary | ICD-10-CM | POA: Diagnosis not present

## 2020-01-12 DIAGNOSIS — E1159 Type 2 diabetes mellitus with other circulatory complications: Secondary | ICD-10-CM | POA: Diagnosis not present

## 2020-01-12 DIAGNOSIS — I10 Essential (primary) hypertension: Secondary | ICD-10-CM | POA: Diagnosis not present

## 2020-01-12 DIAGNOSIS — E1151 Type 2 diabetes mellitus with diabetic peripheral angiopathy without gangrene: Secondary | ICD-10-CM | POA: Diagnosis not present

## 2020-01-15 DIAGNOSIS — E1151 Type 2 diabetes mellitus with diabetic peripheral angiopathy without gangrene: Secondary | ICD-10-CM | POA: Diagnosis not present

## 2020-01-15 DIAGNOSIS — G8929 Other chronic pain: Secondary | ICD-10-CM | POA: Diagnosis not present

## 2020-01-15 DIAGNOSIS — I890 Lymphedema, not elsewhere classified: Secondary | ICD-10-CM | POA: Diagnosis not present

## 2020-01-15 DIAGNOSIS — M1711 Unilateral primary osteoarthritis, right knee: Secondary | ICD-10-CM | POA: Diagnosis not present

## 2020-01-15 DIAGNOSIS — I10 Essential (primary) hypertension: Secondary | ICD-10-CM | POA: Diagnosis not present

## 2020-01-15 DIAGNOSIS — E1159 Type 2 diabetes mellitus with other circulatory complications: Secondary | ICD-10-CM | POA: Diagnosis not present

## 2020-01-15 DIAGNOSIS — E1169 Type 2 diabetes mellitus with other specified complication: Secondary | ICD-10-CM | POA: Diagnosis not present

## 2020-01-15 DIAGNOSIS — E7849 Other hyperlipidemia: Secondary | ICD-10-CM | POA: Diagnosis not present

## 2020-01-15 DIAGNOSIS — E11618 Type 2 diabetes mellitus with other diabetic arthropathy: Secondary | ICD-10-CM | POA: Diagnosis not present

## 2020-01-16 DIAGNOSIS — I890 Lymphedema, not elsewhere classified: Secondary | ICD-10-CM | POA: Diagnosis not present

## 2020-01-16 DIAGNOSIS — E11618 Type 2 diabetes mellitus with other diabetic arthropathy: Secondary | ICD-10-CM | POA: Diagnosis not present

## 2020-01-16 DIAGNOSIS — G8929 Other chronic pain: Secondary | ICD-10-CM | POA: Diagnosis not present

## 2020-01-16 DIAGNOSIS — E1169 Type 2 diabetes mellitus with other specified complication: Secondary | ICD-10-CM | POA: Diagnosis not present

## 2020-01-16 DIAGNOSIS — M1711 Unilateral primary osteoarthritis, right knee: Secondary | ICD-10-CM | POA: Diagnosis not present

## 2020-01-16 DIAGNOSIS — I10 Essential (primary) hypertension: Secondary | ICD-10-CM | POA: Diagnosis not present

## 2020-01-16 DIAGNOSIS — E7849 Other hyperlipidemia: Secondary | ICD-10-CM | POA: Diagnosis not present

## 2020-01-16 DIAGNOSIS — E1151 Type 2 diabetes mellitus with diabetic peripheral angiopathy without gangrene: Secondary | ICD-10-CM | POA: Diagnosis not present

## 2020-01-16 DIAGNOSIS — E1159 Type 2 diabetes mellitus with other circulatory complications: Secondary | ICD-10-CM | POA: Diagnosis not present

## 2020-01-17 ENCOUNTER — Telehealth: Payer: Self-pay | Admitting: Family Medicine

## 2020-01-17 DIAGNOSIS — E11618 Type 2 diabetes mellitus with other diabetic arthropathy: Secondary | ICD-10-CM | POA: Diagnosis not present

## 2020-01-17 DIAGNOSIS — I890 Lymphedema, not elsewhere classified: Secondary | ICD-10-CM | POA: Diagnosis not present

## 2020-01-17 DIAGNOSIS — E1151 Type 2 diabetes mellitus with diabetic peripheral angiopathy without gangrene: Secondary | ICD-10-CM | POA: Diagnosis not present

## 2020-01-17 DIAGNOSIS — G8929 Other chronic pain: Secondary | ICD-10-CM | POA: Diagnosis not present

## 2020-01-17 DIAGNOSIS — E7849 Other hyperlipidemia: Secondary | ICD-10-CM | POA: Diagnosis not present

## 2020-01-17 DIAGNOSIS — E1169 Type 2 diabetes mellitus with other specified complication: Secondary | ICD-10-CM | POA: Diagnosis not present

## 2020-01-17 DIAGNOSIS — I10 Essential (primary) hypertension: Secondary | ICD-10-CM | POA: Diagnosis not present

## 2020-01-17 DIAGNOSIS — M1711 Unilateral primary osteoarthritis, right knee: Secondary | ICD-10-CM | POA: Diagnosis not present

## 2020-01-17 DIAGNOSIS — E1159 Type 2 diabetes mellitus with other circulatory complications: Secondary | ICD-10-CM | POA: Diagnosis not present

## 2020-01-17 NOTE — Telephone Encounter (Signed)
I spoke to [place] concerning Mr. Hatchel They are having a virtual/video visit with you tomorrow concerning order for hospital bed They are working with [facility] [phone]  I called and spoke to Lawson Fiscal and she states that your notes have to state that he needs hospital bed  And diagnosis. She states that if PT hs been out and states he needs be that documentation and Diagnosis would possibly help with qualifying him for a bed She states that arthritis and diabetes does not qualify him for a bed

## 2020-01-18 ENCOUNTER — Telehealth: Payer: Medicare PPO | Admitting: Family Medicine

## 2020-01-18 ENCOUNTER — Other Ambulatory Visit: Payer: Self-pay

## 2020-01-18 ENCOUNTER — Encounter: Payer: Self-pay | Admitting: Family Medicine

## 2020-01-18 VITALS — BP 125/70 | HR 69 | Wt 188.0 lb

## 2020-01-18 DIAGNOSIS — E11618 Type 2 diabetes mellitus with other diabetic arthropathy: Secondary | ICD-10-CM

## 2020-01-18 DIAGNOSIS — I890 Lymphedema, not elsewhere classified: Secondary | ICD-10-CM | POA: Diagnosis not present

## 2020-01-18 DIAGNOSIS — I739 Peripheral vascular disease, unspecified: Secondary | ICD-10-CM | POA: Diagnosis not present

## 2020-01-18 DIAGNOSIS — Z7409 Other reduced mobility: Secondary | ICD-10-CM

## 2020-01-18 NOTE — Progress Notes (Signed)
   Subjective:    Patient ID: Joe Barry, male    DOB: 04/24/1934, 86 y.o.   MRN: [id number]  HPI Documentation for virtual audio and video telecommunications through [employer] encounter: The patient was located at home. The provider was located in the office. The patient did consent to this visit and is aware of possible charges through their insurance for this visit. The other persons participating in this telemedicine service was him and his daughter Time spent on call was 5 minutes and in review of previous records >10 minutes total. Today's visit is to assess his need for hospital bed.  He has been seen by physical therapy and their input indicates that he would be helped by having a bed.  He has a great deal of difficulty with mobility getting in and out of bed as well as using his Rollator.  Other factors are his peripheral vascular disease as well as chronic lymphedema making his mobility quite difficult.  He also has underlying diabetes as well as general arthritis. This virtual service is not related to other E/M service within previous 7 days.     Review of Systems     Objective:   Physical Exam Alert and in no distress otherwise not examined       Assessment & Plan:  Need for assistance due to reduced mobility  PVD (peripheral vascular disease) (HCC)  Arthritis associated with diabetes (HCC)  Lymphedema I think he would most definitely benefit from a hospital bed to help with his overall mobility.

## 2020-01-22 ENCOUNTER — Telehealth: Payer: Self-pay | Admitting: Family Medicine

## 2020-01-22 DIAGNOSIS — I890 Lymphedema, not elsewhere classified: Secondary | ICD-10-CM | POA: Diagnosis not present

## 2020-01-22 DIAGNOSIS — E1159 Type 2 diabetes mellitus with other circulatory complications: Secondary | ICD-10-CM | POA: Diagnosis not present

## 2020-01-22 DIAGNOSIS — E7849 Other hyperlipidemia: Secondary | ICD-10-CM | POA: Diagnosis not present

## 2020-01-22 DIAGNOSIS — G8929 Other chronic pain: Secondary | ICD-10-CM | POA: Diagnosis not present

## 2020-01-22 DIAGNOSIS — E11618 Type 2 diabetes mellitus with other diabetic arthropathy: Secondary | ICD-10-CM | POA: Diagnosis not present

## 2020-01-22 DIAGNOSIS — E1169 Type 2 diabetes mellitus with other specified complication: Secondary | ICD-10-CM | POA: Diagnosis not present

## 2020-01-22 DIAGNOSIS — M1711 Unilateral primary osteoarthritis, right knee: Secondary | ICD-10-CM | POA: Diagnosis not present

## 2020-01-22 DIAGNOSIS — Z7409 Other reduced mobility: Secondary | ICD-10-CM | POA: Diagnosis not present

## 2020-01-22 DIAGNOSIS — E1151 Type 2 diabetes mellitus with diabetic peripheral angiopathy without gangrene: Secondary | ICD-10-CM | POA: Diagnosis not present

## 2020-01-22 DIAGNOSIS — I739 Peripheral vascular disease, unspecified: Secondary | ICD-10-CM | POA: Diagnosis not present

## 2020-01-22 DIAGNOSIS — I10 Essential (primary) hypertension: Secondary | ICD-10-CM | POA: Diagnosis not present

## 2020-01-22 NOTE — Telephone Encounter (Signed)
[facility] called and states that pt had a fall over the weekened states that pt used his life alert and that EMS come and said he was ok no injury, she can be reached back at [phone]

## 2020-01-24 DIAGNOSIS — E1169 Type 2 diabetes mellitus with other specified complication: Secondary | ICD-10-CM | POA: Diagnosis not present

## 2020-01-24 DIAGNOSIS — E1159 Type 2 diabetes mellitus with other circulatory complications: Secondary | ICD-10-CM | POA: Diagnosis not present

## 2020-01-24 DIAGNOSIS — I10 Essential (primary) hypertension: Secondary | ICD-10-CM | POA: Diagnosis not present

## 2020-01-24 DIAGNOSIS — E11618 Type 2 diabetes mellitus with other diabetic arthropathy: Secondary | ICD-10-CM | POA: Diagnosis not present

## 2020-01-24 DIAGNOSIS — M1711 Unilateral primary osteoarthritis, right knee: Secondary | ICD-10-CM | POA: Diagnosis not present

## 2020-01-24 DIAGNOSIS — E1151 Type 2 diabetes mellitus with diabetic peripheral angiopathy without gangrene: Secondary | ICD-10-CM | POA: Diagnosis not present

## 2020-01-24 DIAGNOSIS — E7849 Other hyperlipidemia: Secondary | ICD-10-CM | POA: Diagnosis not present

## 2020-01-24 DIAGNOSIS — I890 Lymphedema, not elsewhere classified: Secondary | ICD-10-CM | POA: Diagnosis not present

## 2020-01-24 DIAGNOSIS — G8929 Other chronic pain: Secondary | ICD-10-CM | POA: Diagnosis not present

## 2020-01-25 DIAGNOSIS — E11618 Type 2 diabetes mellitus with other diabetic arthropathy: Secondary | ICD-10-CM | POA: Diagnosis not present

## 2020-01-25 DIAGNOSIS — E1159 Type 2 diabetes mellitus with other circulatory complications: Secondary | ICD-10-CM | POA: Diagnosis not present

## 2020-01-25 DIAGNOSIS — I10 Essential (primary) hypertension: Secondary | ICD-10-CM | POA: Diagnosis not present

## 2020-01-25 DIAGNOSIS — E1169 Type 2 diabetes mellitus with other specified complication: Secondary | ICD-10-CM | POA: Diagnosis not present

## 2020-01-25 DIAGNOSIS — I890 Lymphedema, not elsewhere classified: Secondary | ICD-10-CM | POA: Diagnosis not present

## 2020-01-25 DIAGNOSIS — E1151 Type 2 diabetes mellitus with diabetic peripheral angiopathy without gangrene: Secondary | ICD-10-CM | POA: Diagnosis not present

## 2020-01-25 DIAGNOSIS — G8929 Other chronic pain: Secondary | ICD-10-CM | POA: Diagnosis not present

## 2020-01-25 DIAGNOSIS — E7849 Other hyperlipidemia: Secondary | ICD-10-CM | POA: Diagnosis not present

## 2020-01-25 DIAGNOSIS — M1711 Unilateral primary osteoarthritis, right knee: Secondary | ICD-10-CM | POA: Diagnosis not present

## 2020-01-29 DIAGNOSIS — E1159 Type 2 diabetes mellitus with other circulatory complications: Secondary | ICD-10-CM | POA: Diagnosis not present

## 2020-01-29 DIAGNOSIS — G8929 Other chronic pain: Secondary | ICD-10-CM | POA: Diagnosis not present

## 2020-01-29 DIAGNOSIS — E1169 Type 2 diabetes mellitus with other specified complication: Secondary | ICD-10-CM | POA: Diagnosis not present

## 2020-01-29 DIAGNOSIS — E1151 Type 2 diabetes mellitus with diabetic peripheral angiopathy without gangrene: Secondary | ICD-10-CM | POA: Diagnosis not present

## 2020-01-29 DIAGNOSIS — E11618 Type 2 diabetes mellitus with other diabetic arthropathy: Secondary | ICD-10-CM | POA: Diagnosis not present

## 2020-01-29 DIAGNOSIS — E7849 Other hyperlipidemia: Secondary | ICD-10-CM | POA: Diagnosis not present

## 2020-01-29 DIAGNOSIS — M1711 Unilateral primary osteoarthritis, right knee: Secondary | ICD-10-CM | POA: Diagnosis not present

## 2020-01-29 DIAGNOSIS — I890 Lymphedema, not elsewhere classified: Secondary | ICD-10-CM | POA: Diagnosis not present

## 2020-01-29 DIAGNOSIS — I10 Essential (primary) hypertension: Secondary | ICD-10-CM | POA: Diagnosis not present

## 2020-01-30 DIAGNOSIS — E1169 Type 2 diabetes mellitus with other specified complication: Secondary | ICD-10-CM | POA: Diagnosis not present

## 2020-01-30 DIAGNOSIS — I10 Essential (primary) hypertension: Secondary | ICD-10-CM | POA: Diagnosis not present

## 2020-01-30 DIAGNOSIS — E11618 Type 2 diabetes mellitus with other diabetic arthropathy: Secondary | ICD-10-CM | POA: Diagnosis not present

## 2020-01-30 DIAGNOSIS — G8929 Other chronic pain: Secondary | ICD-10-CM | POA: Diagnosis not present

## 2020-01-30 DIAGNOSIS — E7849 Other hyperlipidemia: Secondary | ICD-10-CM | POA: Diagnosis not present

## 2020-01-30 DIAGNOSIS — M1711 Unilateral primary osteoarthritis, right knee: Secondary | ICD-10-CM | POA: Diagnosis not present

## 2020-01-30 DIAGNOSIS — E1151 Type 2 diabetes mellitus with diabetic peripheral angiopathy without gangrene: Secondary | ICD-10-CM | POA: Diagnosis not present

## 2020-01-30 DIAGNOSIS — E1159 Type 2 diabetes mellitus with other circulatory complications: Secondary | ICD-10-CM | POA: Diagnosis not present

## 2020-01-30 DIAGNOSIS — I890 Lymphedema, not elsewhere classified: Secondary | ICD-10-CM | POA: Diagnosis not present

## 2020-01-31 DIAGNOSIS — G8929 Other chronic pain: Secondary | ICD-10-CM | POA: Diagnosis not present

## 2020-01-31 DIAGNOSIS — E1169 Type 2 diabetes mellitus with other specified complication: Secondary | ICD-10-CM | POA: Diagnosis not present

## 2020-01-31 DIAGNOSIS — I890 Lymphedema, not elsewhere classified: Secondary | ICD-10-CM | POA: Diagnosis not present

## 2020-01-31 DIAGNOSIS — E1159 Type 2 diabetes mellitus with other circulatory complications: Secondary | ICD-10-CM | POA: Diagnosis not present

## 2020-01-31 DIAGNOSIS — M1711 Unilateral primary osteoarthritis, right knee: Secondary | ICD-10-CM | POA: Diagnosis not present

## 2020-01-31 DIAGNOSIS — I10 Essential (primary) hypertension: Secondary | ICD-10-CM | POA: Diagnosis not present

## 2020-01-31 DIAGNOSIS — E7849 Other hyperlipidemia: Secondary | ICD-10-CM | POA: Diagnosis not present

## 2020-01-31 DIAGNOSIS — E1151 Type 2 diabetes mellitus with diabetic peripheral angiopathy without gangrene: Secondary | ICD-10-CM | POA: Diagnosis not present

## 2020-01-31 DIAGNOSIS — E11618 Type 2 diabetes mellitus with other diabetic arthropathy: Secondary | ICD-10-CM | POA: Diagnosis not present

## 2020-02-05 DIAGNOSIS — G8929 Other chronic pain: Secondary | ICD-10-CM | POA: Diagnosis not present

## 2020-02-05 DIAGNOSIS — E11618 Type 2 diabetes mellitus with other diabetic arthropathy: Secondary | ICD-10-CM | POA: Diagnosis not present

## 2020-02-05 DIAGNOSIS — I10 Essential (primary) hypertension: Secondary | ICD-10-CM | POA: Diagnosis not present

## 2020-02-05 DIAGNOSIS — E1159 Type 2 diabetes mellitus with other circulatory complications: Secondary | ICD-10-CM | POA: Diagnosis not present

## 2020-02-05 DIAGNOSIS — I890 Lymphedema, not elsewhere classified: Secondary | ICD-10-CM | POA: Diagnosis not present

## 2020-02-05 DIAGNOSIS — E7849 Other hyperlipidemia: Secondary | ICD-10-CM | POA: Diagnosis not present

## 2020-02-05 DIAGNOSIS — E1169 Type 2 diabetes mellitus with other specified complication: Secondary | ICD-10-CM | POA: Diagnosis not present

## 2020-02-05 DIAGNOSIS — M1711 Unilateral primary osteoarthritis, right knee: Secondary | ICD-10-CM | POA: Diagnosis not present

## 2020-02-05 DIAGNOSIS — E1151 Type 2 diabetes mellitus with diabetic peripheral angiopathy without gangrene: Secondary | ICD-10-CM | POA: Diagnosis not present

## 2020-02-07 DIAGNOSIS — G8929 Other chronic pain: Secondary | ICD-10-CM | POA: Diagnosis not present

## 2020-02-07 DIAGNOSIS — E1169 Type 2 diabetes mellitus with other specified complication: Secondary | ICD-10-CM | POA: Diagnosis not present

## 2020-02-07 DIAGNOSIS — E1159 Type 2 diabetes mellitus with other circulatory complications: Secondary | ICD-10-CM | POA: Diagnosis not present

## 2020-02-07 DIAGNOSIS — E1151 Type 2 diabetes mellitus with diabetic peripheral angiopathy without gangrene: Secondary | ICD-10-CM | POA: Diagnosis not present

## 2020-02-07 DIAGNOSIS — M1711 Unilateral primary osteoarthritis, right knee: Secondary | ICD-10-CM | POA: Diagnosis not present

## 2020-02-07 DIAGNOSIS — I10 Essential (primary) hypertension: Secondary | ICD-10-CM | POA: Diagnosis not present

## 2020-02-07 DIAGNOSIS — I890 Lymphedema, not elsewhere classified: Secondary | ICD-10-CM | POA: Diagnosis not present

## 2020-02-07 DIAGNOSIS — E7849 Other hyperlipidemia: Secondary | ICD-10-CM | POA: Diagnosis not present

## 2020-02-07 DIAGNOSIS — E11618 Type 2 diabetes mellitus with other diabetic arthropathy: Secondary | ICD-10-CM | POA: Diagnosis not present

## 2020-02-15 DIAGNOSIS — E1169 Type 2 diabetes mellitus with other specified complication: Secondary | ICD-10-CM | POA: Diagnosis not present

## 2020-02-15 DIAGNOSIS — M1711 Unilateral primary osteoarthritis, right knee: Secondary | ICD-10-CM | POA: Diagnosis not present

## 2020-02-15 DIAGNOSIS — E1151 Type 2 diabetes mellitus with diabetic peripheral angiopathy without gangrene: Secondary | ICD-10-CM | POA: Diagnosis not present

## 2020-02-15 DIAGNOSIS — I10 Essential (primary) hypertension: Secondary | ICD-10-CM | POA: Diagnosis not present

## 2020-02-15 DIAGNOSIS — E7849 Other hyperlipidemia: Secondary | ICD-10-CM | POA: Diagnosis not present

## 2020-02-15 DIAGNOSIS — E11618 Type 2 diabetes mellitus with other diabetic arthropathy: Secondary | ICD-10-CM | POA: Diagnosis not present

## 2020-02-15 DIAGNOSIS — I890 Lymphedema, not elsewhere classified: Secondary | ICD-10-CM | POA: Diagnosis not present

## 2020-02-15 DIAGNOSIS — E1159 Type 2 diabetes mellitus with other circulatory complications: Secondary | ICD-10-CM | POA: Diagnosis not present

## 2020-02-15 DIAGNOSIS — G8929 Other chronic pain: Secondary | ICD-10-CM | POA: Diagnosis not present

## 2020-02-20 DIAGNOSIS — I890 Lymphedema, not elsewhere classified: Secondary | ICD-10-CM | POA: Diagnosis not present

## 2020-02-20 DIAGNOSIS — I10 Essential (primary) hypertension: Secondary | ICD-10-CM | POA: Diagnosis not present

## 2020-02-20 DIAGNOSIS — E1151 Type 2 diabetes mellitus with diabetic peripheral angiopathy without gangrene: Secondary | ICD-10-CM | POA: Diagnosis not present

## 2020-02-20 DIAGNOSIS — E1169 Type 2 diabetes mellitus with other specified complication: Secondary | ICD-10-CM | POA: Diagnosis not present

## 2020-02-20 DIAGNOSIS — M1711 Unilateral primary osteoarthritis, right knee: Secondary | ICD-10-CM | POA: Diagnosis not present

## 2020-02-20 DIAGNOSIS — E7849 Other hyperlipidemia: Secondary | ICD-10-CM | POA: Diagnosis not present

## 2020-02-20 DIAGNOSIS — E11618 Type 2 diabetes mellitus with other diabetic arthropathy: Secondary | ICD-10-CM | POA: Diagnosis not present

## 2020-02-20 DIAGNOSIS — G8929 Other chronic pain: Secondary | ICD-10-CM | POA: Diagnosis not present

## 2020-02-20 DIAGNOSIS — E1159 Type 2 diabetes mellitus with other circulatory complications: Secondary | ICD-10-CM | POA: Diagnosis not present

## 2020-02-22 DIAGNOSIS — E7849 Other hyperlipidemia: Secondary | ICD-10-CM | POA: Diagnosis not present

## 2020-02-22 DIAGNOSIS — E1169 Type 2 diabetes mellitus with other specified complication: Secondary | ICD-10-CM | POA: Diagnosis not present

## 2020-02-22 DIAGNOSIS — Z7409 Other reduced mobility: Secondary | ICD-10-CM | POA: Diagnosis not present

## 2020-02-22 DIAGNOSIS — I739 Peripheral vascular disease, unspecified: Secondary | ICD-10-CM | POA: Diagnosis not present

## 2020-02-22 DIAGNOSIS — E1151 Type 2 diabetes mellitus with diabetic peripheral angiopathy without gangrene: Secondary | ICD-10-CM | POA: Diagnosis not present

## 2020-02-22 DIAGNOSIS — E11618 Type 2 diabetes mellitus with other diabetic arthropathy: Secondary | ICD-10-CM | POA: Diagnosis not present

## 2020-02-22 DIAGNOSIS — I10 Essential (primary) hypertension: Secondary | ICD-10-CM | POA: Diagnosis not present

## 2020-02-22 DIAGNOSIS — G8929 Other chronic pain: Secondary | ICD-10-CM | POA: Diagnosis not present

## 2020-02-22 DIAGNOSIS — M1711 Unilateral primary osteoarthritis, right knee: Secondary | ICD-10-CM | POA: Diagnosis not present

## 2020-02-22 DIAGNOSIS — I890 Lymphedema, not elsewhere classified: Secondary | ICD-10-CM | POA: Diagnosis not present

## 2020-02-22 DIAGNOSIS — E1159 Type 2 diabetes mellitus with other circulatory complications: Secondary | ICD-10-CM | POA: Diagnosis not present

## 2020-02-27 DIAGNOSIS — E7849 Other hyperlipidemia: Secondary | ICD-10-CM | POA: Diagnosis not present

## 2020-02-27 DIAGNOSIS — I10 Essential (primary) hypertension: Secondary | ICD-10-CM | POA: Diagnosis not present

## 2020-02-27 DIAGNOSIS — I890 Lymphedema, not elsewhere classified: Secondary | ICD-10-CM | POA: Diagnosis not present

## 2020-02-27 DIAGNOSIS — E1169 Type 2 diabetes mellitus with other specified complication: Secondary | ICD-10-CM | POA: Diagnosis not present

## 2020-02-27 DIAGNOSIS — M1711 Unilateral primary osteoarthritis, right knee: Secondary | ICD-10-CM | POA: Diagnosis not present

## 2020-02-27 DIAGNOSIS — E1159 Type 2 diabetes mellitus with other circulatory complications: Secondary | ICD-10-CM | POA: Diagnosis not present

## 2020-02-27 DIAGNOSIS — G8929 Other chronic pain: Secondary | ICD-10-CM | POA: Diagnosis not present

## 2020-02-27 DIAGNOSIS — E1151 Type 2 diabetes mellitus with diabetic peripheral angiopathy without gangrene: Secondary | ICD-10-CM | POA: Diagnosis not present

## 2020-02-27 DIAGNOSIS — E11618 Type 2 diabetes mellitus with other diabetic arthropathy: Secondary | ICD-10-CM | POA: Diagnosis not present

## 2020-02-29 DIAGNOSIS — G8929 Other chronic pain: Secondary | ICD-10-CM | POA: Diagnosis not present

## 2020-02-29 DIAGNOSIS — M1711 Unilateral primary osteoarthritis, right knee: Secondary | ICD-10-CM | POA: Diagnosis not present

## 2020-02-29 DIAGNOSIS — E1169 Type 2 diabetes mellitus with other specified complication: Secondary | ICD-10-CM | POA: Diagnosis not present

## 2020-02-29 DIAGNOSIS — I10 Essential (primary) hypertension: Secondary | ICD-10-CM | POA: Diagnosis not present

## 2020-02-29 DIAGNOSIS — E1159 Type 2 diabetes mellitus with other circulatory complications: Secondary | ICD-10-CM | POA: Diagnosis not present

## 2020-02-29 DIAGNOSIS — E11618 Type 2 diabetes mellitus with other diabetic arthropathy: Secondary | ICD-10-CM | POA: Diagnosis not present

## 2020-02-29 DIAGNOSIS — I890 Lymphedema, not elsewhere classified: Secondary | ICD-10-CM | POA: Diagnosis not present

## 2020-02-29 DIAGNOSIS — E1151 Type 2 diabetes mellitus with diabetic peripheral angiopathy without gangrene: Secondary | ICD-10-CM | POA: Diagnosis not present

## 2020-02-29 DIAGNOSIS — E7849 Other hyperlipidemia: Secondary | ICD-10-CM | POA: Diagnosis not present

## 2020-03-05 DIAGNOSIS — E1159 Type 2 diabetes mellitus with other circulatory complications: Secondary | ICD-10-CM | POA: Diagnosis not present

## 2020-03-05 DIAGNOSIS — G8929 Other chronic pain: Secondary | ICD-10-CM | POA: Diagnosis not present

## 2020-03-05 DIAGNOSIS — E1151 Type 2 diabetes mellitus with diabetic peripheral angiopathy without gangrene: Secondary | ICD-10-CM | POA: Diagnosis not present

## 2020-03-05 DIAGNOSIS — E11618 Type 2 diabetes mellitus with other diabetic arthropathy: Secondary | ICD-10-CM | POA: Diagnosis not present

## 2020-03-05 DIAGNOSIS — M1711 Unilateral primary osteoarthritis, right knee: Secondary | ICD-10-CM | POA: Diagnosis not present

## 2020-03-05 DIAGNOSIS — I10 Essential (primary) hypertension: Secondary | ICD-10-CM | POA: Diagnosis not present

## 2020-03-05 DIAGNOSIS — I890 Lymphedema, not elsewhere classified: Secondary | ICD-10-CM | POA: Diagnosis not present

## 2020-03-05 DIAGNOSIS — E1169 Type 2 diabetes mellitus with other specified complication: Secondary | ICD-10-CM | POA: Diagnosis not present

## 2020-03-05 DIAGNOSIS — E7849 Other hyperlipidemia: Secondary | ICD-10-CM | POA: Diagnosis not present

## 2020-03-07 DIAGNOSIS — E1169 Type 2 diabetes mellitus with other specified complication: Secondary | ICD-10-CM | POA: Diagnosis not present

## 2020-03-07 DIAGNOSIS — I890 Lymphedema, not elsewhere classified: Secondary | ICD-10-CM | POA: Diagnosis not present

## 2020-03-07 DIAGNOSIS — E7849 Other hyperlipidemia: Secondary | ICD-10-CM | POA: Diagnosis not present

## 2020-03-07 DIAGNOSIS — E1151 Type 2 diabetes mellitus with diabetic peripheral angiopathy without gangrene: Secondary | ICD-10-CM | POA: Diagnosis not present

## 2020-03-07 DIAGNOSIS — E11618 Type 2 diabetes mellitus with other diabetic arthropathy: Secondary | ICD-10-CM | POA: Diagnosis not present

## 2020-03-07 DIAGNOSIS — M1711 Unilateral primary osteoarthritis, right knee: Secondary | ICD-10-CM | POA: Diagnosis not present

## 2020-03-07 DIAGNOSIS — E1159 Type 2 diabetes mellitus with other circulatory complications: Secondary | ICD-10-CM | POA: Diagnosis not present

## 2020-03-07 DIAGNOSIS — G8929 Other chronic pain: Secondary | ICD-10-CM | POA: Diagnosis not present

## 2020-03-07 DIAGNOSIS — I10 Essential (primary) hypertension: Secondary | ICD-10-CM | POA: Diagnosis not present

## 2020-03-12 DIAGNOSIS — I10 Essential (primary) hypertension: Secondary | ICD-10-CM | POA: Diagnosis not present

## 2020-03-12 DIAGNOSIS — M1711 Unilateral primary osteoarthritis, right knee: Secondary | ICD-10-CM | POA: Diagnosis not present

## 2020-03-12 DIAGNOSIS — E1151 Type 2 diabetes mellitus with diabetic peripheral angiopathy without gangrene: Secondary | ICD-10-CM | POA: Diagnosis not present

## 2020-03-12 DIAGNOSIS — E11618 Type 2 diabetes mellitus with other diabetic arthropathy: Secondary | ICD-10-CM | POA: Diagnosis not present

## 2020-03-12 DIAGNOSIS — E1159 Type 2 diabetes mellitus with other circulatory complications: Secondary | ICD-10-CM | POA: Diagnosis not present

## 2020-03-12 DIAGNOSIS — I890 Lymphedema, not elsewhere classified: Secondary | ICD-10-CM | POA: Diagnosis not present

## 2020-03-12 DIAGNOSIS — G8929 Other chronic pain: Secondary | ICD-10-CM | POA: Diagnosis not present

## 2020-03-12 DIAGNOSIS — E7849 Other hyperlipidemia: Secondary | ICD-10-CM | POA: Diagnosis not present

## 2020-03-12 DIAGNOSIS — E1169 Type 2 diabetes mellitus with other specified complication: Secondary | ICD-10-CM | POA: Diagnosis not present

## 2020-03-15 ENCOUNTER — Telehealth: Payer: Self-pay | Admitting: Family Medicine

## 2020-03-15 DIAGNOSIS — E1159 Type 2 diabetes mellitus with other circulatory complications: Secondary | ICD-10-CM | POA: Diagnosis not present

## 2020-03-15 DIAGNOSIS — E11618 Type 2 diabetes mellitus with other diabetic arthropathy: Secondary | ICD-10-CM | POA: Diagnosis not present

## 2020-03-15 DIAGNOSIS — I10 Essential (primary) hypertension: Secondary | ICD-10-CM | POA: Diagnosis not present

## 2020-03-15 DIAGNOSIS — M1711 Unilateral primary osteoarthritis, right knee: Secondary | ICD-10-CM | POA: Diagnosis not present

## 2020-03-15 DIAGNOSIS — E1151 Type 2 diabetes mellitus with diabetic peripheral angiopathy without gangrene: Secondary | ICD-10-CM | POA: Diagnosis not present

## 2020-03-15 DIAGNOSIS — G8929 Other chronic pain: Secondary | ICD-10-CM | POA: Diagnosis not present

## 2020-03-15 DIAGNOSIS — E1169 Type 2 diabetes mellitus with other specified complication: Secondary | ICD-10-CM | POA: Diagnosis not present

## 2020-03-15 DIAGNOSIS — I890 Lymphedema, not elsewhere classified: Secondary | ICD-10-CM | POA: Diagnosis not present

## 2020-03-15 DIAGNOSIS — E7849 Other hyperlipidemia: Secondary | ICD-10-CM | POA: Diagnosis not present

## 2020-03-15 NOTE — Telephone Encounter (Signed)
   Vernona Rieger with [facility] [phone]  Need orders to continue PT 2 times week for 2 weeks And 1 times a week for 2 weeks  Also needs order for nurse to evaluate him for edema  Ok to leave message

## 2020-03-15 NOTE — Telephone Encounter (Signed)
Ok

## 2020-03-19 DIAGNOSIS — E1151 Type 2 diabetes mellitus with diabetic peripheral angiopathy without gangrene: Secondary | ICD-10-CM | POA: Diagnosis not present

## 2020-03-19 DIAGNOSIS — G8929 Other chronic pain: Secondary | ICD-10-CM | POA: Diagnosis not present

## 2020-03-19 DIAGNOSIS — E7849 Other hyperlipidemia: Secondary | ICD-10-CM | POA: Diagnosis not present

## 2020-03-19 DIAGNOSIS — M1711 Unilateral primary osteoarthritis, right knee: Secondary | ICD-10-CM | POA: Diagnosis not present

## 2020-03-19 DIAGNOSIS — E1169 Type 2 diabetes mellitus with other specified complication: Secondary | ICD-10-CM | POA: Diagnosis not present

## 2020-03-19 DIAGNOSIS — I10 Essential (primary) hypertension: Secondary | ICD-10-CM | POA: Diagnosis not present

## 2020-03-19 DIAGNOSIS — E11618 Type 2 diabetes mellitus with other diabetic arthropathy: Secondary | ICD-10-CM | POA: Diagnosis not present

## 2020-03-19 DIAGNOSIS — I890 Lymphedema, not elsewhere classified: Secondary | ICD-10-CM | POA: Diagnosis not present

## 2020-03-19 DIAGNOSIS — E1159 Type 2 diabetes mellitus with other circulatory complications: Secondary | ICD-10-CM | POA: Diagnosis not present

## 2020-03-19 NOTE — Telephone Encounter (Signed)
Joe Barry was advised. KH 

## 2020-03-21 DIAGNOSIS — E11618 Type 2 diabetes mellitus with other diabetic arthropathy: Secondary | ICD-10-CM | POA: Diagnosis not present

## 2020-03-21 DIAGNOSIS — E1169 Type 2 diabetes mellitus with other specified complication: Secondary | ICD-10-CM | POA: Diagnosis not present

## 2020-03-21 DIAGNOSIS — G8929 Other chronic pain: Secondary | ICD-10-CM | POA: Diagnosis not present

## 2020-03-21 DIAGNOSIS — E1159 Type 2 diabetes mellitus with other circulatory complications: Secondary | ICD-10-CM | POA: Diagnosis not present

## 2020-03-21 DIAGNOSIS — I890 Lymphedema, not elsewhere classified: Secondary | ICD-10-CM | POA: Diagnosis not present

## 2020-03-21 DIAGNOSIS — E7849 Other hyperlipidemia: Secondary | ICD-10-CM | POA: Diagnosis not present

## 2020-03-21 DIAGNOSIS — I10 Essential (primary) hypertension: Secondary | ICD-10-CM | POA: Diagnosis not present

## 2020-03-21 DIAGNOSIS — M1711 Unilateral primary osteoarthritis, right knee: Secondary | ICD-10-CM | POA: Diagnosis not present

## 2020-03-21 DIAGNOSIS — E1151 Type 2 diabetes mellitus with diabetic peripheral angiopathy without gangrene: Secondary | ICD-10-CM | POA: Diagnosis not present

## 2020-03-23 DIAGNOSIS — I890 Lymphedema, not elsewhere classified: Secondary | ICD-10-CM | POA: Diagnosis not present

## 2020-03-23 DIAGNOSIS — I739 Peripheral vascular disease, unspecified: Secondary | ICD-10-CM | POA: Diagnosis not present

## 2020-03-23 DIAGNOSIS — Z7409 Other reduced mobility: Secondary | ICD-10-CM | POA: Diagnosis not present

## 2020-03-24 ENCOUNTER — Other Ambulatory Visit: Payer: Self-pay | Admitting: Family Medicine

## 2020-03-26 DIAGNOSIS — M1711 Unilateral primary osteoarthritis, right knee: Secondary | ICD-10-CM | POA: Diagnosis not present

## 2020-03-26 DIAGNOSIS — I10 Essential (primary) hypertension: Secondary | ICD-10-CM | POA: Diagnosis not present

## 2020-03-26 DIAGNOSIS — E11618 Type 2 diabetes mellitus with other diabetic arthropathy: Secondary | ICD-10-CM | POA: Diagnosis not present

## 2020-03-26 DIAGNOSIS — G8929 Other chronic pain: Secondary | ICD-10-CM | POA: Diagnosis not present

## 2020-03-26 DIAGNOSIS — I890 Lymphedema, not elsewhere classified: Secondary | ICD-10-CM | POA: Diagnosis not present

## 2020-03-26 DIAGNOSIS — E7849 Other hyperlipidemia: Secondary | ICD-10-CM | POA: Diagnosis not present

## 2020-03-26 DIAGNOSIS — E1159 Type 2 diabetes mellitus with other circulatory complications: Secondary | ICD-10-CM | POA: Diagnosis not present

## 2020-03-26 DIAGNOSIS — E1169 Type 2 diabetes mellitus with other specified complication: Secondary | ICD-10-CM | POA: Diagnosis not present

## 2020-03-26 DIAGNOSIS — E1151 Type 2 diabetes mellitus with diabetic peripheral angiopathy without gangrene: Secondary | ICD-10-CM | POA: Diagnosis not present

## 2020-03-28 DIAGNOSIS — E1151 Type 2 diabetes mellitus with diabetic peripheral angiopathy without gangrene: Secondary | ICD-10-CM | POA: Diagnosis not present

## 2020-03-28 DIAGNOSIS — I10 Essential (primary) hypertension: Secondary | ICD-10-CM | POA: Diagnosis not present

## 2020-03-28 DIAGNOSIS — E1159 Type 2 diabetes mellitus with other circulatory complications: Secondary | ICD-10-CM | POA: Diagnosis not present

## 2020-03-28 DIAGNOSIS — E7849 Other hyperlipidemia: Secondary | ICD-10-CM | POA: Diagnosis not present

## 2020-03-28 DIAGNOSIS — G8929 Other chronic pain: Secondary | ICD-10-CM | POA: Diagnosis not present

## 2020-03-28 DIAGNOSIS — E11618 Type 2 diabetes mellitus with other diabetic arthropathy: Secondary | ICD-10-CM | POA: Diagnosis not present

## 2020-03-28 DIAGNOSIS — I890 Lymphedema, not elsewhere classified: Secondary | ICD-10-CM | POA: Diagnosis not present

## 2020-03-28 DIAGNOSIS — E1169 Type 2 diabetes mellitus with other specified complication: Secondary | ICD-10-CM | POA: Diagnosis not present

## 2020-03-28 DIAGNOSIS — M1711 Unilateral primary osteoarthritis, right knee: Secondary | ICD-10-CM | POA: Diagnosis not present

## 2020-04-02 DIAGNOSIS — G8929 Other chronic pain: Secondary | ICD-10-CM | POA: Diagnosis not present

## 2020-04-02 DIAGNOSIS — I890 Lymphedema, not elsewhere classified: Secondary | ICD-10-CM | POA: Diagnosis not present

## 2020-04-02 DIAGNOSIS — E7849 Other hyperlipidemia: Secondary | ICD-10-CM | POA: Diagnosis not present

## 2020-04-02 DIAGNOSIS — I10 Essential (primary) hypertension: Secondary | ICD-10-CM | POA: Diagnosis not present

## 2020-04-02 DIAGNOSIS — E1159 Type 2 diabetes mellitus with other circulatory complications: Secondary | ICD-10-CM | POA: Diagnosis not present

## 2020-04-02 DIAGNOSIS — E1169 Type 2 diabetes mellitus with other specified complication: Secondary | ICD-10-CM | POA: Diagnosis not present

## 2020-04-02 DIAGNOSIS — E11618 Type 2 diabetes mellitus with other diabetic arthropathy: Secondary | ICD-10-CM | POA: Diagnosis not present

## 2020-04-02 DIAGNOSIS — M1711 Unilateral primary osteoarthritis, right knee: Secondary | ICD-10-CM | POA: Diagnosis not present

## 2020-04-02 DIAGNOSIS — E1151 Type 2 diabetes mellitus with diabetic peripheral angiopathy without gangrene: Secondary | ICD-10-CM | POA: Diagnosis not present

## 2020-04-09 DIAGNOSIS — E1169 Type 2 diabetes mellitus with other specified complication: Secondary | ICD-10-CM | POA: Diagnosis not present

## 2020-04-09 DIAGNOSIS — G8929 Other chronic pain: Secondary | ICD-10-CM | POA: Diagnosis not present

## 2020-04-09 DIAGNOSIS — I890 Lymphedema, not elsewhere classified: Secondary | ICD-10-CM | POA: Diagnosis not present

## 2020-04-09 DIAGNOSIS — E11618 Type 2 diabetes mellitus with other diabetic arthropathy: Secondary | ICD-10-CM | POA: Diagnosis not present

## 2020-04-09 DIAGNOSIS — E1159 Type 2 diabetes mellitus with other circulatory complications: Secondary | ICD-10-CM | POA: Diagnosis not present

## 2020-04-09 DIAGNOSIS — E1151 Type 2 diabetes mellitus with diabetic peripheral angiopathy without gangrene: Secondary | ICD-10-CM | POA: Diagnosis not present

## 2020-04-09 DIAGNOSIS — M1711 Unilateral primary osteoarthritis, right knee: Secondary | ICD-10-CM | POA: Diagnosis not present

## 2020-04-09 DIAGNOSIS — E7849 Other hyperlipidemia: Secondary | ICD-10-CM | POA: Diagnosis not present

## 2020-04-09 DIAGNOSIS — I10 Essential (primary) hypertension: Secondary | ICD-10-CM | POA: Diagnosis not present

## 2020-04-10 ENCOUNTER — Telehealth: Payer: Self-pay | Admitting: Family Medicine

## 2020-04-10 NOTE — Telephone Encounter (Signed)
Vernona Rieger from [facility] home health care called and wanted to let you know that they are D/c home health for pt she can be reached at [phone] with any questions

## 2020-04-23 ENCOUNTER — Other Ambulatory Visit: Payer: Self-pay | Admitting: Family Medicine

## 2020-04-23 DIAGNOSIS — I739 Peripheral vascular disease, unspecified: Secondary | ICD-10-CM | POA: Diagnosis not present

## 2020-04-23 DIAGNOSIS — I890 Lymphedema, not elsewhere classified: Secondary | ICD-10-CM | POA: Diagnosis not present

## 2020-04-23 DIAGNOSIS — I152 Hypertension secondary to endocrine disorders: Secondary | ICD-10-CM

## 2020-04-23 DIAGNOSIS — Z7409 Other reduced mobility: Secondary | ICD-10-CM | POA: Diagnosis not present

## 2020-05-24 DIAGNOSIS — I739 Peripheral vascular disease, unspecified: Secondary | ICD-10-CM | POA: Diagnosis not present

## 2020-05-24 DIAGNOSIS — Z7409 Other reduced mobility: Secondary | ICD-10-CM | POA: Diagnosis not present

## 2020-05-24 DIAGNOSIS — I890 Lymphedema, not elsewhere classified: Secondary | ICD-10-CM | POA: Diagnosis not present

## 2020-06-23 DIAGNOSIS — Z7409 Other reduced mobility: Secondary | ICD-10-CM | POA: Diagnosis not present

## 2020-06-23 DIAGNOSIS — I739 Peripheral vascular disease, unspecified: Secondary | ICD-10-CM | POA: Diagnosis not present

## 2020-06-23 DIAGNOSIS — I890 Lymphedema, not elsewhere classified: Secondary | ICD-10-CM | POA: Diagnosis not present

## 2020-07-09 ENCOUNTER — Ambulatory Visit (INDEPENDENT_AMBULATORY_CARE_PROVIDER_SITE_OTHER): Payer: Medicare PPO | Admitting: Family Medicine

## 2020-07-09 VITALS — BP 130/74 | HR 80 | Temp 98.4°F | Wt 192.0 lb

## 2020-07-09 DIAGNOSIS — I152 Hypertension secondary to endocrine disorders: Secondary | ICD-10-CM

## 2020-07-09 DIAGNOSIS — Z23 Encounter for immunization: Secondary | ICD-10-CM | POA: Diagnosis not present

## 2020-07-09 DIAGNOSIS — I729 Aneurysm of unspecified site: Secondary | ICD-10-CM | POA: Diagnosis not present

## 2020-07-09 DIAGNOSIS — I739 Peripheral vascular disease, unspecified: Secondary | ICD-10-CM | POA: Diagnosis not present

## 2020-07-09 DIAGNOSIS — E1169 Type 2 diabetes mellitus with other specified complication: Secondary | ICD-10-CM | POA: Diagnosis not present

## 2020-07-09 DIAGNOSIS — E669 Obesity, unspecified: Secondary | ICD-10-CM

## 2020-07-09 DIAGNOSIS — E1159 Type 2 diabetes mellitus with other circulatory complications: Secondary | ICD-10-CM | POA: Diagnosis not present

## 2020-07-09 DIAGNOSIS — I890 Lymphedema, not elsewhere classified: Secondary | ICD-10-CM

## 2020-07-09 DIAGNOSIS — B351 Tinea unguium: Secondary | ICD-10-CM

## 2020-07-09 DIAGNOSIS — E11618 Type 2 diabetes mellitus with other diabetic arthropathy: Secondary | ICD-10-CM

## 2020-07-09 DIAGNOSIS — H35341 Macular cyst, hole, or pseudohole, right eye: Secondary | ICD-10-CM

## 2020-07-09 DIAGNOSIS — E118 Type 2 diabetes mellitus with unspecified complications: Secondary | ICD-10-CM

## 2020-07-09 DIAGNOSIS — Z0000 Encounter for general adult medical examination without abnormal findings: Secondary | ICD-10-CM | POA: Diagnosis not present

## 2020-07-09 DIAGNOSIS — E785 Hyperlipidemia, unspecified: Secondary | ICD-10-CM | POA: Diagnosis not present

## 2020-07-09 LAB — POCT GLYCOSYLATED HEMOGLOBIN (HGB A1C): Hemoglobin A1C: 6.6 % — AB (ref 4.0–5.6)

## 2020-07-09 MED ORDER — EMPAGLIFLOZIN 10 MG PO TABS: ORAL_TABLET | ORAL | 1 refills | Status: DC

## 2020-07-09 MED ORDER — OLMESARTAN MEDOXOMIL 5 MG PO TABS: 5.0000 mg | ORAL_TABLET | Freq: Every day | ORAL | 3 refills | Status: DC

## 2020-07-09 MED ORDER — SITAGLIPTIN PHOSPHATE 50 MG PO TABS: 50.0000 mg | ORAL_TABLET | Freq: Every day | ORAL | 1 refills | Status: DC

## 2020-07-09 MED ORDER — HYDROCHLOROTHIAZIDE 12.5 MG PO CAPS: 12.5000 mg | ORAL_CAPSULE | Freq: Every day | ORAL | 3 refills | Status: DC

## 2020-07-09 MED ORDER — PIOGLITAZONE HCL-METFORMIN HCL 15-850 MG PO TABS: 1.0000 | ORAL_TABLET | Freq: Two times a day (BID) | ORAL | 1 refills | Status: DC

## 2020-07-09 MED ORDER — LOVASTATIN 40 MG PO TABS: 40.0000 mg | ORAL_TABLET | Freq: Every day | ORAL | 3 refills | Status: DC

## 2020-07-09 MED ORDER — AMLODIPINE BESYLATE 10 MG PO TABS: 10.0000 mg | ORAL_TABLET | Freq: Every day | ORAL | 3 refills | Status: DC

## 2020-07-09 NOTE — Patient Instructions (Signed)
  Mr. Joe Barry , Thank you for taking time to come for your Medicare Wellness Visit. I appreciate your ongoing commitment to your health goals. Please review the following plan we discussed and let me know if I can assist you in the future.   These are the goals we discussed: Goals   None     This is a list of the screening recommended for you and due dates:  Health Maintenance  Topic Date Due  . Eye exam for diabetics  09/17/2016  . Hemoglobin A1C  01/01/2020  . Complete foot exam   07/09/2021  . Tetanus Vaccine  01/20/2022  . Flu Shot  Completed  . COVID-19 Vaccine  Completed  . Pneumonia vaccines  Completed

## 2020-07-09 NOTE — Progress Notes (Signed)
Joe Barry is a 86 y.o. male who presents for annual wellness visit and follow-up on chronic medical conditions.  He is here with his daughter who takes primary care of him.  He does have underlying diabetes and does check his blood sugars regularly.  She states that they usually run around 130.  He did have 1 fall but states it was because he was clumsy.  He has not had an eye exam and will set this up with Dr. Bing Plume.  Does have a history of lymphedema and notes that he also has difficulty if he does not elevate his feet during the day.  He has had an ABI in the past which was negative.  He also has x-ray evidence of a false aneurysm.  He continues on amlodipine, HCTZ and olmesartan for his blood pressure.  He is using Jardiance, Januvia and Actos plus for his diabetes.  His arthritis keeps him relatively sedentary.  He also has a history of onychomycosis but is not on any medications for this.  Immunizations and Health Maintenance Immunization History  Administered Date(s) Administered  . Fluad Quad(high Dose 65+) 07/03/2019, 07/09/2020  . Influenza Split 06/20/2009, 08/24/2011, 07/17/2012  . Influenza, High Dose Seasonal PF 07/05/2013, 06/12/2014, 05/14/2015, 10/27/2016, 06/17/2017  . PFIZER SARS-COV-2 Vaccination 11/09/2019, 12/05/2019, 07/09/2020  . Pneumococcal Conjugate-13 11/25/2015  . Pneumococcal Polysaccharide-23 01/21/2012  . Tdap 01/21/2012   Health Maintenance Due  Topic Date Due  . OPHTHALMOLOGY EXAM  09/17/2016  . HEMOGLOBIN A1C  01/01/2020    Last colonoscopy: 2008 Last PSA: Unknown  Dentist: N/A Ophtho:Q year but delayed due to covid  Exercise: stretching 15- 20 min a day    Other doctors caring for patient include: N/A  Advanced Directives: Does Patient Have a Medical Advance Directive?: Yes Type of Advance Directive: Living will Does patient want to make changes to medical advance directive?: No - Patient declined  Depression screen:  See questionnaire below.      Depression screen [facility] 2/9 07/09/2020 07/03/2019 04/18/2018 04/03/2016 11/25/2015  Decreased Interest 0 0 0 0 0  Down, Depressed, Hopeless 0 0 0 0 0  PHQ - 2 Score 0 0 0 0 0    Fall Screen: See Questionaire below.   Fall Risk  07/09/2020 07/03/2019 04/18/2018 04/03/2016 11/25/2015  Falls in the past year? 1 1 No No Yes  Number falls in past yr: 0 1 - - 2 or more  Injury with Fall? 0 0 - - No  Risk for fall due to : - - - - -    ADL screen:  See questionnaire below.  Functional Status Survey: Is the patient deaf or have difficulty hearing?: No Does the patient have difficulty seeing, even when wearing glasses/contacts?: No Does the patient have difficulty concentrating, remembering, or making decisions?: No Does the patient have difficulty walking or climbing stairs?: Yes Does the patient have difficulty dressing or bathing?: No Does the patient have difficulty doing errands alone such as visiting a doctor's office or shopping?: Yes   Review of Systems  Constitutional: -, -unexpected weight change, -anorexia, -fatigue Allergy: -sneezing, -itching, -congestion Dermatology: denies changing moles, rash, lumps ENT: -runny nose, -ear pain, -sore throat,  Cardiology:  -chest pain, -palpitations, -orthopnea, Respiratory: -cough, -shortness of breath, -dyspnea on exertion, -wheezing,  Gastroenterology: -abdominal pain, -nausea, -vomiting, -diarrhea, -constipation, -dysphagia Hematology: -bleeding or bruising problems Musculoskeletal: -arthralgias, -myalgias, -joint swelling, -back pain, - Ophthalmology: -vision changes,  Urology: -dysuria, -difficulty urinating,  -urinary frequency, -urgency, incontinence Neurology: -, -  numbness, , -memory loss, -falls, -dizziness    PHYSICAL EXAM:   General Appearance: Alert, cooperative, no distress, appears stated age Head: Normocephalic, without obvious abnormality, atraumatic Eyes: PERRL, conjunctiva/corneas clear, EOM's intact,  Ears: Normal  TM's and external ear canals Nose: Nares normal, mucosa normal, no drainage or sinus   tenderness Throat: Lips, mucosa, and tongue normal; teeth and gums normal Neck: Supple, no lymphadenopathy, thyroid:no enlargement/tenderness/nodules; no carotid bruit or JVD Lungs: Clear to auscultation bilaterally without wheezes, rales or ronchi; respirations unlabored Heart: Regular rate and rhythm, S1 and S2 normal, no murmur, rub or gallop Extremities: No clubbing, cyanosis extensive edema is noted and pulses were not palpable.  Good capillary refill.  Skin: Skin color, texture, turgor normal, no rashes or lesions Lymph nodes: Cervical, supraclavicular, and axillary nodes normal Neurologic: CNII-XII intact, normal strength, sensation and gait; reflexes 2+ and symmetric throughout   Psych: Normal mood, affect, hygiene and grooming Hemoglobin A1c is 6.6. ASSESSMENT/PLAN: Type 2 diabetes mellitus with complications ([place]) - Plan: POCT glycosylated hemoglobin (Hb A1C), sitaGLIPtin (JANUVIA) 50 MG tablet, pioglitazone-metformin (ACTOPLUS MET) 15-850 MG tablet, empagliflozin (JARDIANCE) 10 MG TABS tablet  Immunization, viral disease - Plan: Pfizer SARS-COV-2 Vaccine  Need for influenza vaccination - Plan: Flu Vaccine QUAD High Dose(Fluad)  Macular hole of right eye  Hypertension associated with diabetes ([place]) - Plan: olmesartan (BENICAR) 5 MG tablet, hydrochlorothiazide (MICROZIDE) 12.5 MG capsule, amLODipine (NORVASC) 10 MG tablet  Hyperlipidemia associated with type 2 diabetes mellitus ([place]) - Plan: Lipid panel, lovastatin (MEVACOR) 40 MG tablet  Lymphedema  Obesity (BMI 30-39.9)  PVD (peripheral vascular disease) ([place])  False aneurysm (HCC)  Arthritis associated with diabetes ([place])  Onychomycosis He will continue on his present medication regimen.  He will set up an appointment to see ophthalmology.  No therapy for the onychomycosis.  Recommend he elevate his feet is much as possible.   Immunization recommendations discussed.  Colonoscopy recommendations reviewed.   Medicare Attestation I have personally reviewed: The patient's medical and social history Their use of alcohol, tobacco or illicit drugs Their current medications and supplements The patient's functional ability including ADLs,fall risks, home safety risks, cognitive, and hearing and visual impairment Diet and physical activities Evidence for depression or mood disorders  The patient's weight, height, and BMI have been recorded in the chart.  I have made referrals, counseling, and provided education to the patient based on review of the above and I have provided the patient with a written personalized care plan for preventive services.     Jill Alexanders, MD   07/09/2020

## 2020-07-10 ENCOUNTER — Telehealth: Payer: Self-pay

## 2020-07-10 LAB — LIPID PANEL
Chol/HDL Ratio: 1.6 ratio (ref 0.0–5.0)
Cholesterol, Total: 154 mg/dL (ref 100–199)
HDL: 97 mg/dL (ref >39)
LDL Chol Calc (NIH): 45 mg/dL (ref 0–99)
Triglycerides: 55 mg/dL (ref 0–149)
VLDL Cholesterol Cal: 12 mg/dL (ref 5–40)

## 2020-07-10 NOTE — Telephone Encounter (Signed)
yes

## 2020-07-10 NOTE — Telephone Encounter (Signed)
Pt daughter called to advise that pt has not been taking his metformin twice a day. Per his visit A1C was good and blood sugar readings have been good. Can he continue to just take it once per day since labs are fine [facility]

## 2020-07-11 NOTE — Telephone Encounter (Signed)
Pt daughter was advised. KH 

## 2020-07-24 DIAGNOSIS — Z7409 Other reduced mobility: Secondary | ICD-10-CM | POA: Diagnosis not present

## 2020-07-24 DIAGNOSIS — I739 Peripheral vascular disease, unspecified: Secondary | ICD-10-CM | POA: Diagnosis not present

## 2020-07-24 DIAGNOSIS — I890 Lymphedema, not elsewhere classified: Secondary | ICD-10-CM | POA: Diagnosis not present

## 2020-07-26 ENCOUNTER — Other Ambulatory Visit: Payer: Self-pay | Admitting: Family Medicine

## 2020-07-26 DIAGNOSIS — E118 Type 2 diabetes mellitus with unspecified complications: Secondary | ICD-10-CM

## 2020-08-23 DIAGNOSIS — Z7409 Other reduced mobility: Secondary | ICD-10-CM | POA: Diagnosis not present

## 2020-08-23 DIAGNOSIS — I739 Peripheral vascular disease, unspecified: Secondary | ICD-10-CM | POA: Diagnosis not present

## 2020-08-23 DIAGNOSIS — I890 Lymphedema, not elsewhere classified: Secondary | ICD-10-CM | POA: Diagnosis not present

## 2020-09-23 DIAGNOSIS — I739 Peripheral vascular disease, unspecified: Secondary | ICD-10-CM | POA: Diagnosis not present

## 2020-09-23 DIAGNOSIS — Z7409 Other reduced mobility: Secondary | ICD-10-CM | POA: Diagnosis not present

## 2020-09-23 DIAGNOSIS — I890 Lymphedema, not elsewhere classified: Secondary | ICD-10-CM | POA: Diagnosis not present

## 2020-10-15 ENCOUNTER — Other Ambulatory Visit: Payer: Self-pay | Admitting: Family Medicine

## 2020-10-15 ENCOUNTER — Ambulatory Visit: Payer: Medicare PPO | Admitting: Family Medicine

## 2020-10-15 ENCOUNTER — Encounter: Payer: Self-pay | Admitting: Family Medicine

## 2020-10-15 ENCOUNTER — Other Ambulatory Visit: Payer: Self-pay

## 2020-10-15 VITALS — BP 152/80 | HR 90 | Temp 98.4°F | Wt 185.2 lb

## 2020-10-15 DIAGNOSIS — L980 Pyogenic granuloma: Secondary | ICD-10-CM

## 2020-10-15 DIAGNOSIS — L989 Disorder of the skin and subcutaneous tissue, unspecified: Secondary | ICD-10-CM | POA: Diagnosis not present

## 2020-10-15 NOTE — Progress Notes (Signed)
   Subjective:    Patient ID: Joe Barry, male    DOB: 1934-06-08, 87 y.o.   MRN: [id number]  HPI He injured his left elbow approximately several weeks ago causing it to bleed.  The bleeding is slowly diminished but still every time he bangs it into something it will bleed.  He also has seen a growth coming from that area.   Review of Systems     Objective:   Physical Exam  Exam of the left elbow does show a pedunculated 1-1/2 cm well appearing lesion with recent bleeding.  It is not warm hot or tender.      Assessment & Plan:  Skin lesion of left upper extremity The lesion was injected with Xylocaine and epinephrine and removed sent for pathology.  The base was hyfrecated with electrocautery.  He tolerated the procedure well.

## 2020-10-21 HISTORY — PX: SKIN BIOPSY: SHX1

## 2020-10-24 DIAGNOSIS — I890 Lymphedema, not elsewhere classified: Secondary | ICD-10-CM | POA: Diagnosis not present

## 2020-10-24 DIAGNOSIS — I739 Peripheral vascular disease, unspecified: Secondary | ICD-10-CM | POA: Diagnosis not present

## 2020-10-24 DIAGNOSIS — Z7409 Other reduced mobility: Secondary | ICD-10-CM | POA: Diagnosis not present

## 2020-11-21 DIAGNOSIS — I890 Lymphedema, not elsewhere classified: Secondary | ICD-10-CM | POA: Diagnosis not present

## 2020-11-21 DIAGNOSIS — I739 Peripheral vascular disease, unspecified: Secondary | ICD-10-CM | POA: Diagnosis not present

## 2020-11-21 DIAGNOSIS — Z7409 Other reduced mobility: Secondary | ICD-10-CM | POA: Diagnosis not present

## 2020-12-22 DIAGNOSIS — Z7409 Other reduced mobility: Secondary | ICD-10-CM | POA: Diagnosis not present

## 2020-12-22 DIAGNOSIS — I890 Lymphedema, not elsewhere classified: Secondary | ICD-10-CM | POA: Diagnosis not present

## 2020-12-22 DIAGNOSIS — I739 Peripheral vascular disease, unspecified: Secondary | ICD-10-CM | POA: Diagnosis not present

## 2021-01-02 ENCOUNTER — Other Ambulatory Visit: Payer: Self-pay | Admitting: Family Medicine

## 2021-01-02 DIAGNOSIS — E118 Type 2 diabetes mellitus with unspecified complications: Secondary | ICD-10-CM

## 2021-01-07 ENCOUNTER — Ambulatory Visit: Payer: Self-pay | Admitting: Family Medicine

## 2021-01-20 ENCOUNTER — Ambulatory Visit: Payer: Self-pay | Admitting: Family Medicine

## 2021-01-21 DIAGNOSIS — Z7409 Other reduced mobility: Secondary | ICD-10-CM | POA: Diagnosis not present

## 2021-01-21 DIAGNOSIS — I890 Lymphedema, not elsewhere classified: Secondary | ICD-10-CM | POA: Diagnosis not present

## 2021-01-21 DIAGNOSIS — I739 Peripheral vascular disease, unspecified: Secondary | ICD-10-CM | POA: Diagnosis not present

## 2021-01-28 ENCOUNTER — Other Ambulatory Visit: Payer: Self-pay

## 2021-01-28 ENCOUNTER — Ambulatory Visit (INDEPENDENT_AMBULATORY_CARE_PROVIDER_SITE_OTHER): Payer: Medicare PPO | Admitting: Family Medicine

## 2021-01-28 VITALS — BP 126/74 | HR 79 | Temp 97.1°F

## 2021-01-28 DIAGNOSIS — I152 Hypertension secondary to endocrine disorders: Secondary | ICD-10-CM

## 2021-01-28 DIAGNOSIS — I890 Lymphedema, not elsewhere classified: Secondary | ICD-10-CM

## 2021-01-28 DIAGNOSIS — E785 Hyperlipidemia, unspecified: Secondary | ICD-10-CM | POA: Diagnosis not present

## 2021-01-28 DIAGNOSIS — E11618 Type 2 diabetes mellitus with other diabetic arthropathy: Secondary | ICD-10-CM

## 2021-01-28 DIAGNOSIS — E1169 Type 2 diabetes mellitus with other specified complication: Secondary | ICD-10-CM | POA: Diagnosis not present

## 2021-01-28 DIAGNOSIS — E669 Obesity, unspecified: Secondary | ICD-10-CM | POA: Diagnosis not present

## 2021-01-28 DIAGNOSIS — E118 Type 2 diabetes mellitus with unspecified complications: Secondary | ICD-10-CM

## 2021-01-28 DIAGNOSIS — E1159 Type 2 diabetes mellitus with other circulatory complications: Secondary | ICD-10-CM | POA: Diagnosis not present

## 2021-01-28 LAB — POCT GLYCOSYLATED HEMOGLOBIN (HGB A1C): Hemoglobin A1C: 7.1 % — AB (ref 4.0–5.6)

## 2021-01-28 NOTE — Progress Notes (Signed)
Subjective:    Patient ID: Joe Barry, male    DOB: 30-Jul-1934, 87 y.o.   MRN: [id number]  Joe Barry is a 87 y.o. male who presents for follow-up of Type 2 diabetes mellitus. His daughter is his primary caregiver and make sure that he takes his medications and gets his insulin.  She checks his blood sugars. Home blood sugar records: meter record , 130 avg. , fasting  Current symptoms/problems include none at this time. Daily foot checks: yes   Any foot concerns: dry skin and sweeling Exercise: staying active indoors Diet: good He continues on Januvia, Jardiance, Actos plus and doing well on that.  Continues on amlodipine and is supposed to be on olmesartan but apparently is not taking it at the present time.  He feels that he is taking enough medications at the present time and is holding off on adding that back into the system.  He does complain of some difficulty with hand pain and would like x-rays to see what is going on.  He has been using some tramadol for that in the past as well as Voltaren. The following portions of the patient's history were reviewed and updated as appropriate: allergies, current medications, past medical history, past social history and problem list.  ROS as in subjective above.     Objective:    Physical Exam Alert and in no distress otherwise not examined. Exam of both hands does show some arthritic changes in his digits.  Good motion of the fingers and wrist. Hemoglobin A1c is 7.1  Lab Review Diabetic Labs Latest Ref Rng & Units 07/09/2020 12/12/2019 07/03/2019 04/18/2018 06/17/2017  HbA1c 4.0 - 5.6 % 6.6(A) - 12.6(A) 8.8(A) 7.2  Microalbumin mg/dL - - - - -  Micro/Creat Ratio - - - - - -  Chol 100 - 199 mg/dL 326 - 712 - 458  HDL >09 mg/dL 97 - 84 - 96  Calc LDL 0 - 99 mg/dL 45 - 70 - 38  Triglycerides 0 - 149 mg/dL 55 - 62 - 983  Creatinine 0.61 - 1.24 mg/dL - 3.82 5.05 - 3.97(Q)   BP/Weight 10/15/2020 07/09/2020 01/18/2020 12/18/2019 12/12/2019   Systolic BP 152 130 125 - 176  Diastolic BP 80 74 70 - 90  Wt. (Lbs) 185.2 192 188 188 -  BMI 31.79 32.96 32.27 32.27 -   Foot/eye exam completion dates Latest Ref Rng & Units 07/09/2020 06/17/2017  Eye Exam No Retinopathy - -  Foot Form Completion - Done Done    Joe Barry  reports that he quit smoking about 48 years ago. His smoking use included cigarettes. He has never used smokeless tobacco. He reports current alcohol use of about 3.0 standard drinks of alcohol per week. He reports that he does not use drugs.     Assessment & Plan:    Type 2 diabetes mellitus with complications (HCC) - Plan: POCT glycosylated hemoglobin (Hb A1C), CBC with Differential/Platelet, Comprehensive metabolic panel, Lipid panel, POCT UA - Microalbumin  Hypertension associated with diabetes (HCC) - Plan: CBC with Differential/Platelet, Comprehensive metabolic panel  Hyperlipidemia associated with type 2 diabetes mellitus (HCC) - Plan: Lipid panel  Lymphedema  Obesity (BMI 30-39.9)  Arthritis associated with diabetes (HCC) - Plan: DG Hand Complete Left, DG Hand Complete Right   1. Rx changes: none 2. Education: Reviewed 'ABCs' of diabetes management (respective goals in parentheses):  A1C (<7), blood pressure (<130/80), and cholesterol (LDL <100). 3. Compliance at present is estimated to be good. Efforts  to improve compliance (if necessary) will be directed at increased exercise.  As tolerated 4. Follow up: 4 months

## 2021-01-30 LAB — LIPID PANEL
Chol/HDL Ratio: 1.7 ratio (ref 0.0–5.0)
Cholesterol, Total: 167 mg/dL (ref 100–199)
HDL: 98 mg/dL (ref >39)
LDL Chol Calc (NIH): 59 mg/dL (ref 0–99)
Triglycerides: 46 mg/dL (ref 0–149)
VLDL Cholesterol Cal: 10 mg/dL (ref 5–40)

## 2021-01-30 LAB — CBC WITH DIFFERENTIAL/PLATELET
Basophils Absolute: 0 10*3/uL (ref 0.0–0.2)
Basos: 0 %
EOS (ABSOLUTE): 0.1 10*3/uL (ref 0.0–0.4)
Eos: 2 %
Hematocrit: 39.3 % (ref 37.5–51.0)
Hemoglobin: 12.7 g/dL — ABNORMAL LOW (ref 13.0–17.7)
Immature Grans (Abs): 0 10*3/uL (ref 0.0–0.1)
Immature Granulocytes: 0 %
Lymphocytes Absolute: 1.2 10*3/uL (ref 0.7–3.1)
Lymphs: 26 %
MCH: 29.1 pg (ref 26.6–33.0)
MCHC: 32.3 g/dL (ref 31.5–35.7)
MCV: 90 fL (ref 79–97)
Monocytes Absolute: 0.5 10*3/uL (ref 0.1–0.9)
Monocytes: 10 %
Neutrophils Absolute: 2.8 10*3/uL (ref 1.4–7.0)
Neutrophils: 62 %
Platelets: 242 10*3/uL (ref 150–450)
RBC: 4.37 x10E6/uL (ref 4.14–5.80)
RDW: 14.9 % (ref 11.6–15.4)
WBC: 4.5 10*3/uL (ref 3.4–10.8)

## 2021-01-30 LAB — COMPREHENSIVE METABOLIC PANEL
ALT: 19 IU/L (ref 0–44)
AST: 19 IU/L (ref 0–40)
Albumin/Globulin Ratio: 1.1 — ABNORMAL LOW (ref 1.2–2.2)
Albumin: 4.2 g/dL (ref 3.6–4.6)
Alkaline Phosphatase: 85 IU/L (ref 44–121)
BUN/Creatinine Ratio: 18 (ref 10–24)
BUN: 17 mg/dL (ref 8–27)
Bilirubin Total: 0.5 mg/dL (ref 0.0–1.2)
CO2: 22 mmol/L (ref 20–29)
Calcium: 9.6 mg/dL (ref 8.6–10.2)
Chloride: 103 mmol/L (ref 96–106)
Creatinine, Ser: 0.94 mg/dL (ref 0.76–1.27)
Globulin, Total: 3.7 g/dL (ref 1.5–4.5)
Glucose: 133 mg/dL — ABNORMAL HIGH (ref 65–99)
Potassium: 4.9 mmol/L (ref 3.5–5.2)
Sodium: 140 mmol/L (ref 134–144)
Total Protein: 7.9 g/dL (ref 6.0–8.5)
eGFR: 79 mL/min/{1.73_m2} (ref >59)

## 2021-02-03 ENCOUNTER — Telehealth: Payer: Self-pay | Admitting: Family Medicine

## 2021-02-03 NOTE — Telephone Encounter (Signed)
Ok . KH 

## 2021-02-03 NOTE — Telephone Encounter (Signed)
Received a fax back in response to a continuity of care records request sent to [facility]. Facility states pt has not been seen since 2018.

## 2021-04-07 ENCOUNTER — Other Ambulatory Visit: Payer: Self-pay | Admitting: Family Medicine

## 2021-04-07 DIAGNOSIS — E118 Type 2 diabetes mellitus with unspecified complications: Secondary | ICD-10-CM

## 2021-04-08 ENCOUNTER — Other Ambulatory Visit: Payer: Self-pay | Admitting: Family Medicine

## 2021-04-08 DIAGNOSIS — E118 Type 2 diabetes mellitus with unspecified complications: Secondary | ICD-10-CM

## 2021-04-09 ENCOUNTER — Other Ambulatory Visit: Payer: Self-pay | Admitting: Family Medicine

## 2021-04-09 DIAGNOSIS — E118 Type 2 diabetes mellitus with unspecified complications: Secondary | ICD-10-CM

## 2021-04-16 ENCOUNTER — Other Ambulatory Visit: Payer: Self-pay | Admitting: Family Medicine

## 2021-04-16 DIAGNOSIS — E118 Type 2 diabetes mellitus with unspecified complications: Secondary | ICD-10-CM

## 2021-04-29 ENCOUNTER — Encounter: Payer: Self-pay | Admitting: Family Medicine

## 2021-04-29 ENCOUNTER — Ambulatory Visit: Payer: Medicare PPO | Admitting: Family Medicine

## 2021-04-29 ENCOUNTER — Other Ambulatory Visit: Payer: Self-pay

## 2021-04-29 VITALS — BP 158/82 | HR 78 | Temp 97.4°F | Ht 65.0 in | Wt 178.2 lb

## 2021-04-29 DIAGNOSIS — R399 Unspecified symptoms and signs involving the genitourinary system: Secondary | ICD-10-CM

## 2021-04-29 DIAGNOSIS — N3001 Acute cystitis with hematuria: Secondary | ICD-10-CM | POA: Diagnosis not present

## 2021-04-29 DIAGNOSIS — E118 Type 2 diabetes mellitus with unspecified complications: Secondary | ICD-10-CM | POA: Diagnosis not present

## 2021-04-29 LAB — POCT URINALYSIS DIP (PROADVANTAGE DEVICE)
Bilirubin, UA: NEGATIVE
Glucose, UA: 500 mg/dL — AB
Nitrite, UA: NEGATIVE
Protein Ur, POC: 30 mg/dL — AB
Specific Gravity, Urine: 1.025
Urobilinogen, Ur: 0.2
pH, UA: 6 (ref 5.0–8.0)

## 2021-04-29 MED ORDER — SULFAMETHOXAZOLE-TRIMETHOPRIM 800-160 MG PO TABS: 1.0000 | ORAL_TABLET | Freq: Two times a day (BID) | ORAL | 0 refills | Status: DC

## 2021-04-29 NOTE — Progress Notes (Signed)
   Subjective:    Patient ID: Joe Barry, male    DOB: [birth year], 87 y.o.   MRN: [id number]  HPI He complains of urinary frequency, urgency, dysuria and occasional incontinence over the last week or so.  Difficult to assess a true timeline.  No fever or chills.  He does have underlying diabetes.   Review of Systems     Objective:   Physical Exam Alert and in no distress.  No abdominal tenderness is noted.  Urine microscopic showed WBCs :TNTC       Assessment & Plan:   UTI symptoms - Plan: POCT Urinalysis DIP (Proadvantage Device)  Acute cystitis with hematuria - Plan: sulfamethoxazole-trimethoprim (BACTRIM DS) 800-160 MG tablet, Urine Culture  Type 2 diabetes mellitus with complications [facility]) His daughter will bring in a urine specimen in 2 weeks for recheck.

## 2021-05-01 LAB — URINE CULTURE

## 2021-05-14 ENCOUNTER — Other Ambulatory Visit: Payer: Self-pay

## 2021-05-14 ENCOUNTER — Other Ambulatory Visit (INDEPENDENT_AMBULATORY_CARE_PROVIDER_SITE_OTHER): Payer: Medicare PPO

## 2021-05-14 DIAGNOSIS — R399 Unspecified symptoms and signs involving the genitourinary system: Secondary | ICD-10-CM | POA: Diagnosis not present

## 2021-05-14 LAB — POCT URINALYSIS DIP (PROADVANTAGE DEVICE)
Bilirubin, UA: NEGATIVE
Glucose, UA: >=1000 mg/dL — AB
Nitrite, UA: NEGATIVE
Specific Gravity, Urine: 1.015
Urobilinogen, Ur: NEGATIVE
pH, UA: 5.5 (ref 5.0–8.0)

## 2021-05-16 LAB — URINE CULTURE

## 2021-06-24 ENCOUNTER — Other Ambulatory Visit: Payer: Medicare PPO

## 2021-06-27 ENCOUNTER — Other Ambulatory Visit: Payer: Self-pay | Admitting: Family Medicine

## 2021-06-27 DIAGNOSIS — E118 Type 2 diabetes mellitus with unspecified complications: Secondary | ICD-10-CM

## 2021-07-15 ENCOUNTER — Telehealth (INDEPENDENT_AMBULATORY_CARE_PROVIDER_SITE_OTHER): Payer: Medicare PPO | Admitting: Family Medicine

## 2021-07-15 ENCOUNTER — Other Ambulatory Visit: Payer: Self-pay

## 2021-07-15 DIAGNOSIS — J209 Acute bronchitis, unspecified: Secondary | ICD-10-CM | POA: Diagnosis not present

## 2021-07-15 MED ORDER — AZITHROMYCIN 500 MG PO TABS: 500.0000 mg | ORAL_TABLET | Freq: Every day | ORAL | 0 refills | Status: DC

## 2021-07-15 NOTE — Progress Notes (Signed)
   Subjective:    Patient ID: AGAMJOT KILGALLON, male    DOB: 1934-03-14, 87 y.o.   MRN: [id number]  HPI Documentation for virtual audio and video telecommunications through [employer] encounter:  The patient was located at home. 2 patient identifiers used.  The provider was located in the office. The patient did consent to this visit and is aware of possible charges through their insurance for this visit. The other persons participating in this telemedicine service was his daughter Time spent on call was 5 minutes and in review of previous records >14 minutes total for counseling and coordination of care. This virtual service is not related to other E/M service within previous 7 days.  He states that approximately 10 days ago he started developing sore throat, malaise, weakness with myalgias and now is having difficulty with productive cough.  The symptoms are not clearing up.  He did get COVID testing it was negative.  His daughter is a Engineer, civil (consulting) and says that his lungs do show evidence of rhonchi.  Review of Systems     Objective:   Physical Exam Alert and in no distress.  Rhonchi noted by his daughter who is a Engineer, civil (consulting).  He does not appear tachypneic.       Assessment & Plan:  Acute bronchitis, unspecified organism - Plan: azithromycin (ZITHROMAX) 500 MG tablet I explained that I thought this was a secondary bacterial infection and worth treating.  Explained that the medication will work for about the next 7 days and if there is any questions after that call.  They were comfortable with that.

## 2021-07-25 ENCOUNTER — Ambulatory Visit (INDEPENDENT_AMBULATORY_CARE_PROVIDER_SITE_OTHER): Payer: Medicare PPO

## 2021-07-25 VITALS — Ht 65.0 in | Wt 160.0 lb

## 2021-07-25 DIAGNOSIS — Z0000 Encounter for general adult medical examination without abnormal findings: Secondary | ICD-10-CM

## 2021-07-25 NOTE — Patient Instructions (Signed)
Joe Barry , Thank you for taking time to come for your Medicare Wellness Visit. I appreciate your ongoing commitment to your health goals. Please review the following plan we discussed and let me know if I can assist you in the future.   Screening recommendations/referrals: Colonoscopy: not required Recommended yearly ophthalmology/optometry visit for glaucoma screening and checkup Recommended yearly dental visit for hygiene and checkup  Vaccinations: Influenza vaccine: due Pneumococcal vaccine: completed 11/25/2015 Tdap vaccine: completed 01/21/2012, due 01/20/2022 Shingles vaccine: discussed   Covid-19:  07/09/2020, 12/05/2019, 11/09/2019  Advanced directives: Please bring a copy of your POA (Power of Attorney) and/or Living Will to your next appointment.   Conditions/risks identified: none  Next appointment: Follow up in one year for your annual wellness visit.   Preventive Care 60 Years and Older, Male Preventive care refers to lifestyle choices and visits with your health care provider that can promote health and wellness. What does preventive care include? A yearly physical exam. This is also called an annual well check. Dental exams once or twice a year. Routine eye exams. Ask your health care provider how often you should have your eyes checked. Personal lifestyle choices, including: Daily care of your teeth and gums. Regular physical activity. Eating a healthy diet. Avoiding tobacco and drug use. Limiting alcohol use. Practicing safe sex. Taking low doses of aspirin every day. Taking vitamin and mineral supplements as recommended by your health care provider. What happens during an annual well check? The services and screenings done by your health care provider during your annual well check will depend on your age, overall health, lifestyle risk factors, and family history of disease. Counseling  Your health care provider may ask you questions about your: Alcohol  use. Tobacco use. Drug use. Emotional well-being. Home and relationship well-being. Sexual activity. Eating habits. History of falls. Memory and ability to understand (cognition). Work and work Astronomer. Screening  You may have the following tests or measurements: Height, weight, and BMI. Blood pressure. Lipid and cholesterol levels. These may be checked every 5 years, or more frequently if you are over 38 years old. Skin check. Lung cancer screening. You may have this screening every year starting at age 58 if you have a 30-pack-year history of smoking and currently smoke or have quit within the past 15 years. Fecal occult blood test (FOBT) of the stool. You may have this test every year starting at age 50. Flexible sigmoidoscopy or colonoscopy. You may have a sigmoidoscopy every 5 years or a colonoscopy every 10 years starting at age 50. Prostate cancer screening. Recommendations will vary depending on your family history and other risks. Hepatitis C blood test. Hepatitis B blood test. Sexually transmitted disease (STD) testing. Diabetes screening. This is done by checking your blood sugar (glucose) after you have not eaten for a while (fasting). You may have this done every 1-3 years. Abdominal aortic aneurysm (AAA) screening. You may need this if you are a current or former smoker. Osteoporosis. You may be screened starting at age 39 if you are at high risk. Talk with your health care provider about your test results, treatment options, and if necessary, the need for more tests. Vaccines  Your health care provider may recommend certain vaccines, such as: Influenza vaccine. This is recommended every year. Tetanus, diphtheria, and acellular pertussis (Tdap, Td) vaccine. You may need a Td booster every 10 years. Zoster vaccine. You may need this after age 46. Pneumococcal 13-valent conjugate (PCV13) vaccine. One dose is recommended after  age 60. Pneumococcal polysaccharide  (PPSV23) vaccine. One dose is recommended after age 66. Talk to your health care provider about which screenings and vaccines you need and how often you need them. This information is not intended to replace advice given to you by your health care provider. Make sure you discuss any questions you have with your health care provider. Document Released: 09/27/2015 Document Revised: 05/20/2016 Document Reviewed: 07/02/2015 Elsevier Interactive Patient Education  2017 [place] Prevention in the Home Falls can cause injuries. They can happen to people of all ages. There are many things you can do to make your home safe and to help prevent falls. What can I do on the outside of my home? Regularly fix the edges of walkways and driveways and fix any cracks. Remove anything that might make you trip as you walk through a door, such as a raised step or threshold. Trim any bushes or trees on the path to your home. Use bright outdoor lighting. Clear any walking paths of anything that might make someone trip, such as rocks or tools. Regularly check to see if handrails are loose or broken. Make sure that both sides of any steps have handrails. Any raised decks and porches should have guardrails on the edges. Have any leaves, snow, or ice cleared regularly. Use sand or salt on walking paths during winter. Clean up any spills in your garage right away. This includes oil or grease spills. What can I do in the bathroom? Use night lights. Install grab bars by the toilet and in the tub and shower. Do not use towel bars as grab bars. Use non-skid mats or decals in the tub or shower. If you need to sit down in the shower, use a plastic, non-slip stool. Keep the floor dry. Clean up any water that spills on the floor as soon as it happens. Remove soap buildup in the tub or shower regularly. Attach bath mats securely with double-sided non-slip rug tape. Do not have throw rugs and other things on the  floor that can make you trip. What can I do in the bedroom? Use night lights. Make sure that you have a light by your bed that is easy to reach. Do not use any sheets or blankets that are too big for your bed. They should not hang down onto the floor. Have a firm chair that has side arms. You can use this for support while you get dressed. Do not have throw rugs and other things on the floor that can make you trip. What can I do in the kitchen? Clean up any spills right away. Avoid walking on wet floors. Keep items that you use a lot in easy-to-reach places. If you need to reach something above you, use a strong step stool that has a grab bar. Keep electrical cords out of the way. Do not use floor polish or wax that makes floors slippery. If you must use wax, use non-skid floor wax. Do not have throw rugs and other things on the floor that can make you trip. What can I do with my stairs? Do not leave any items on the stairs. Make sure that there are handrails on both sides of the stairs and use them. Fix handrails that are broken or loose. Make sure that handrails are as long as the stairways. Check any carpeting to make sure that it is firmly attached to the stairs. Fix any carpet that is loose or worn. Avoid having throw rugs  at the top or bottom of the stairs. If you do have throw rugs, attach them to the floor with carpet tape. Make sure that you have a light switch at the top of the stairs and the bottom of the stairs. If you do not have them, ask someone to add them for you. What else can I do to help prevent falls? Wear shoes that: Do not have high heels. Have rubber bottoms. Are comfortable and fit you well. Are closed at the toe. Do not wear sandals. If you use a stepladder: Make sure that it is fully opened. Do not climb a closed stepladder. Make sure that both sides of the stepladder are locked into place. Ask someone to hold it for you, if possible. Clearly mark and make  sure that you can see: Any grab bars or handrails. First and last steps. Where the edge of each step is. Use tools that help you move around (mobility aids) if they are needed. These include: Canes. Walkers. Scooters. Crutches. Turn on the lights when you go into a dark area. Replace any light bulbs as soon as they burn out. Set up your furniture so you have a clear path. Avoid moving your furniture around. If any of your floors are uneven, fix them. If there are any pets around you, be aware of where they are. Review your medicines with your doctor. Some medicines can make you feel dizzy. This can increase your chance of falling. Ask your doctor what other things that you can do to help prevent falls. This information is not intended to replace advice given to you by your health care provider. Make sure you discuss any questions you have with your health care provider. Document Released: 06/27/2009 Document Revised: 02/06/2016 Document Reviewed: 10/05/2014 Elsevier Interactive Patient Education  2017 [employer].

## 2021-07-25 NOTE — Progress Notes (Signed)
I connected with  [name] today via telehealth video enabled device and verified that I am speaking with the correct person using two identifiers.   Location: Patient: home Provider: work  Persons participating in virtual visit: Deandrae Wajda, Danae Chen (daughter), Glenna Durand LPN  I discussed the limitations, risks, security and privacy concerns of performing an evaluation and management service by video and the availability of in person appointments. The patient expressed understanding and agreed to proceed.   Some vital signs may be absent or patient reported.     Subjective:   NATANEL SNAVELY is a 87 y.o. male who presents for Medicare Annual/Subsequent preventive examination.  Review of Systems     Cardiac Risk Factors include: advanced age (>42mn, >>73women);diabetes mellitus;dyslipidemia;hypertension;male gender;sedentary lifestyle     Objective:    Today's Vitals   07/25/21 1159 07/25/21 1200  Weight: 160 lb (72.6 kg)   Height: _0  (1.651 m)   PainSc:  2    Body mass index is 26.63 kg/m.  Advanced Directives 07/25/2021 07/09/2020 12/12/2019 07/03/2019 04/18/2018 09/22/2012 07/16/2012  Does Patient Have a Medical Advance Directive? Yes Yes Yes No Yes Patient has advance directive, copy not in chart Patient does not have advance directive;Patient would not like information  Type of AScientist, forensicPower of AWiley FordLiving will Living will HHotevilla-BacaviLiving will - - Living will;Healthcare Power of Attorney -  Does patient want to make changes to medical advance directive? - No - Patient declined No - Patient declined - No - Patient declined - -  Copy of HWarm Riverin Chart? No - copy requested - - - - - -  Would patient like information on creating a medical advance directive? - - - No - Patient declined - - -  Pre-existing out of facility DNR order (yellow form or pink MOST form) - - - - - - No    Current Medications  (verified) Outpatient Encounter Medications as of 07/25/2021  Medication Sig   Accu-Chek FastClix Lancets MISC USE AS DIRECTED   acetaminophen (TYLENOL) 500 MG tablet Take 500 mg by mouth every 6 (six) hours as needed for moderate pain.   amLODipine (NORVASC) 10 MG tablet Take 1 tablet (10 mg total) by mouth daily.   Blood Glucose Monitoring Suppl (FORA V12 BLOOD GLUCOSE SYSTEM) DEVI Testing once a day   COMBIGAN 0.2-0.5 % ophthalmic solution Place 1 drop into both eyes every 12 (twelve) hours.    glucose blood (ACCU-CHEK GUIDE) test strip CHECK BLOOD GLUCOSE TWICE DAILY   hydrochlorothiazide (MICROZIDE) 12.5 MG capsule Take 1 capsule (12.5 mg total) by mouth daily.   ibuprofen (ADVIL) 200 MG tablet Take 200 mg by mouth every 6 (six) hours as needed.   JANUVIA 50 MG tablet TAKE 1 TABLET(50 MG) BY MOUTH DAILY   JARDIANCE 10 MG TABS tablet TAKE 1 TABLET BY MOUTH DAILY BEFORE BREAKFAST   Lancet Devices (LITE TOUCH LANCING DEVICE) MISC    lovastatin (MEVACOR) 40 MG tablet Take 1 tablet (40 mg total) by mouth at bedtime.   LUMIGAN 0.01 % SOLN Place 1 drop into both eyes at bedtime.    pioglitazone-metformin (ACTOPLUS MET) 15-850 MG tablet Take 1 tablet by mouth 2 (two) times daily with a meal.   azithromycin (ZITHROMAX) 500 MG tablet Take 1 tablet (500 mg total) by mouth daily. (Patient not taking: Reported on 07/25/2021)   colchicine 0.6 MG tablet Take 0.6 mg by mouth daily as needed. For gout (  Patient not taking: No sig reported)   diclofenac (VOLTAREN) 75 MG EC tablet Take 1 tablet (75 mg total) by mouth 2 (two) times daily. (Patient not taking: No sig reported)   HYDROcodone-acetaminophen (NORCO) 5-325 MG tablet Take 1 tablet by mouth every 6 (six) hours as needed for moderate pain. (Patient not taking: No sig reported)   Insulin Glargine (LANTUS SOLOSTAR) 100 UNIT/ML Solostar Pen Inject 20 Units into the skin daily at 10 pm. (Patient not taking: No sig reported)   olmesartan (BENICAR) 5 MG  tablet Take 1 tablet (5 mg total) by mouth daily. (Patient not taking: No sig reported)   traMADol (ULTRAM) 50 MG tablet TAKE 1 TABLET BY MOUTH EVERY 8 HOURS AS NEEDED FOR PAIN. (Patient not taking: No sig reported)   No facility-administered encounter medications on file as of 07/25/2021.    Allergies (verified) Patient has no known allergies.   History: Past Medical History:  Diagnosis Date   AAA (abdominal aortic aneurysm)    Arthritis    BPH (benign prostatic hypertrophy)    Cataract    Diabetes mellitus    Dyslipidemia    Hypertension    Dr. Matthew Folks,    Neuromuscular disorder [facility])    carpal tunnel in right wrist   No blood products    Refusal of blood transfusions as patient is Jehovah's Witness    Past Surgical History:  Procedure Laterality Date   CARPAL TUNNEL RELEASE     CHOLECYSTECTOMY     COLONOSCOPY  2008   EYE SURGERY     bilat cataract    FALSE ANEURYSM REPAIR  07/16/2012   Procedure: REPAIR FALSE ANEURYSM;  Surgeon: Rosetta Posner, MD;  Location: [facility];  Service: Vascular;  Laterality: Bilateral;  false femoral aneurysm repair with femoral artery repair   GAS/FLUID EXCHANGE  01/28/2012   Procedure: GAS/FLUID EXCHANGE;  Surgeon: Hayden Pedro, MD;  Location: [place];  Service: Ophthalmology;  Laterality: Right;   I & D EXTREMITY  09/23/2012   Procedure: IRRIGATION AND DEBRIDEMENT EXTREMITY;  Surgeon: Rosetta Posner, MD;  Location: [place];  Service: Vascular;  Laterality: Right;  Irrigation and Debridement prepubic and right groin wound   PARS PLANA VITRECTOMY W/ REPAIR OF MACULAR HOLE  01/2012   PARS PLANA VITRECTOMY W/ SCLERAL BUCKLE  01/28/2012   Procedure: PARS PLANA VITRECTOMY WITH LASER FOR MACULAR HOLE;  Surgeon: Hayden Pedro, MD;  Location: [place];  Service: Ophthalmology;  Laterality: Right;  25 gauge ppv right eye    SIGMOIDOSCOPY     SKIN BIOPSY Left 10/21/2020   pyogenic granuloma ulcerated    TOTAL KNEE ARTHROPLASTY  2009   Left   Family History   Problem Relation Age of Onset   Cancer Mother        ovarian   Diabetes Father    Social History   Socioeconomic History   Marital status: Widowed    Spouse name: Not on file   Number of children: Not on file   Years of education: Not on file   Highest education level: Not on file  Occupational History   Not on file  Tobacco Use   Smoking status: Former    Types: Cigarettes    Quit date: 07/26/1972    Years since quitting: 49.0   Smokeless tobacco: Never  Vaping Use   Vaping Use: Never used  Substance and Sexual Activity   Alcohol use: Yes    Alcohol/week: 3.0 standard drinks  Types: 3 Shots of liquor per week    Comment: every day or so   Drug use: No   Sexual activity: Not Currently  Other Topics Concern   Not on file  Social History Narrative   Not on file   Social Determinants of Health   Financial Resource Strain: Low Risk    Difficulty of Paying Living Expenses: Not hard at all  Food Insecurity: No Food Insecurity   Worried About Charity fundraiser in the Last Year: Never true   [place] in the Last Year: Never true  Transportation Needs: No Transportation Needs   Lack of Transportation (Medical): No   Lack of Transportation (Non-Medical): No  Physical Activity: Inactive   Days of Exercise per Week: 0 days   Minutes of Exercise per Session: 0 min  Stress: No Stress Concern Present   Feeling of Stress : Not at all  Social Connections: Not on file    Tobacco Counseling Counseling given: Not Answered   Clinical Intake:  Pre-visit preparation completed: Yes  Pain : 0-10 Pain Score: 2  Pain Type: Chronic pain Pain Location: Generalized Pain Descriptors / Indicators: Aching Pain Onset: More than a month ago Pain Frequency: Constant     Nutritional Status: BMI 25 -29 Overweight Nutritional Risks: None Diabetes: Yes  How often do you need to have someone help you when you read instructions, pamphlets, or other written materials from  your doctor or pharmacy?: 1 - Never  Diabetic? Yes Nutrition Risk Assessment:  Has the patient had any N/V/D within the last 2 months?  No  Does the patient have any non-healing wounds?  No  Has the patient had any unintentional weight loss or weight gain?  No   Diabetes:  Is the patient diabetic?  Yes  If diabetic, was a CBG obtained today?  No  Did the patient bring in their glucometer from home?  No  How often do you monitor your CBG's? Every other day.   Financial Strains and Diabetes Management:  Are you having any financial strains with the device, your supplies or your medication? No .  Does the patient want to be seen by Chronic Care Management for management of their diabetes?  No  Would the patient like to be referred to a Nutritionist or for Diabetic Management?  No   Diabetic Exams:  Diabetic Eye Exam: Overdue for diabetic eye exam. Pt has been advised about the importance in completing this exam. Patient advised to call and schedule an eye exam. Diabetic Foot Exam: Overdue, Pt has been advised about the importance in completing this exam. Pt is scheduled for diabetic foot exam on next appointment.   Interpreter Needed?: No  Information entered by :: NAllen LPN   Activities of Daily Living In your present state of health, do you have any difficulty performing the following activities: 07/25/2021  Hearing? N  Vision? N  Difficulty concentrating or making decisions? N  Walking or climbing stairs? Y  Dressing or bathing? Y  Doing errands, shopping? Y  Preparing Food and eating ? N  Using the Toilet? N  In the past six months, have you accidently leaked urine? Y  Do you have problems with loss of bowel control? N  Managing your Medications? Y  Managing your Finances? Y  Housekeeping or managing your Housekeeping? N  Some recent data might be hidden    Patient Care Team: Denita Lung, MD as PCP - General (Family Medicine)  Indicate any recent Medical  Services you may have received from other than [facility] providers in the past year (date may be approximate).     Assessment:   This is a routine wellness examination for [place].  Hearing/Vision screen No results found.  Dietary issues and exercise activities discussed: Current Exercise Habits: The patient does not participate in regular exercise at present   Goals Addressed             This Visit's Progress    Patient Stated       07/25/2021, no goals       Depression Screen PHQ 2/9 Scores 07/25/2021 07/15/2021 07/09/2020 07/03/2019 04/18/2018 04/03/2016 11/25/2015  PHQ - 2 Score 0 0 0 0 0 0 0    Fall Risk Fall Risk  07/25/2021 01/28/2021 07/09/2020 07/03/2019 04/18/2018  Falls in the past year? _0 No  Comment lost balance - - - -  Number falls in past yr: 0 0 0 1 -  Injury with Fall? 0 1 0 0 -  Risk for fall due to : Impaired balance/gait;Impaired mobility;Medication side effect Impaired mobility - - -  Follow up Falls evaluation completed;Education provided;Falls prevention discussed Falls evaluation completed - - -    FALL RISK PREVENTION PERTAINING TO THE HOME:  Any stairs in or around the home? No  If so, are there any without handrails? N/a Home free of loose throw rugs in walkways, pet beds, electrical cords, etc? Yes  Adequate lighting in your home to reduce risk of falls? Yes   ASSISTIVE DEVICES UTILIZED TO PREVENT FALLS:  Life alert? Yes  Use of a cane, walker or w/c? Yes  Grab bars in the bathroom? Yes  Shower chair or bench in shower? Yes  Elevated toilet seat or a handicapped toilet? Yes   TIMED UP AND GO:  Was the test performed? No .      Cognitive Function:     6CIT Screen 07/25/2021  What Year? 0 points  What month? 0 points  What time? 3 points  Count back from 20 4 points  Months in reverse 4 points  Repeat phrase 10 points  Total Score 21    Immunizations Immunization History  Administered Date(s) Administered   Fluad Quad(high  Dose 65+) 07/03/2019, 07/09/2020   Influenza Split 06/20/2009, 08/24/2011, 07/17/2012   Influenza, High Dose Seasonal PF 07/05/2013, 06/12/2014, 05/14/2015, 10/27/2016, 06/17/2017   PFIZER(Purple Top)SARS-COV-2 Vaccination 11/09/2019, 12/05/2019, 07/09/2020   Pneumococcal Conjugate-13 11/25/2015   Pneumococcal Polysaccharide-23 01/21/2012   Tdap 01/21/2012    TDAP status: Up to date  Flu Vaccine status: Due, Education has been provided regarding the importance of this vaccine. Advised may receive this vaccine at local pharmacy or Health Dept. Aware to provide a copy of the vaccination record if obtained from local pharmacy or Health Dept. Verbalized acceptance and understanding.  Pneumococcal vaccine status: Up to date  Covid-19 vaccine status: Completed vaccines  Qualifies for Shingles Vaccine? Yes   Zostavax completed No   Shingrix Completed?: No.    Education has been provided regarding the importance of this vaccine. Patient has been advised to call insurance company to determine out of pocket expense if they have not yet received this vaccine. Advised may also receive vaccine at local pharmacy or Health Dept. Verbalized acceptance and understanding.  Screening Tests Health Maintenance  Topic Date Due   Zoster Vaccines- Shingrix (1 of 2) Never done   OPHTHALMOLOGY EXAM  07/29/2016   COVID-19 Vaccine (4 - Booster  for Pfizer series) 09/03/2020   INFLUENZA VACCINE  04/14/2021   FOOT EXAM  07/09/2021   HEMOGLOBIN A1C  07/31/2021   TETANUS/TDAP  01/20/2022   Pneumonia Vaccine 60+ Years old  Completed   HPV VACCINES  Aged Out    Health Maintenance  Health Maintenance Due  Topic Date Due   Zoster Vaccines- Shingrix (1 of 2) Never done   OPHTHALMOLOGY EXAM  07/29/2016   COVID-19 Vaccine (4 - Booster for Pfizer series) 09/03/2020   INFLUENZA VACCINE  04/14/2021   FOOT EXAM  07/09/2021    Colorectal cancer screening: No longer required.   Lung Cancer Screening: (Low Dose CT  Chest recommended if Age 42-80 years, 30 pack-year currently smoking OR have quit w/in 15years.) does not qualify.   Lung Cancer Screening Referral: no  Additional Screening:  Hepatitis C Screening: does not qualify;  Vision Screening: Recommended annual ophthalmology exams for early detection of glaucoma and other disorders of the eye. Is the patient up to date with their annual eye exam?  No  Who is the provider or what is the name of the office in which the patient attends annual eye exams? Can't remember name If pt is not established with a provider, would they like to be referred to a provider to establish care? No .   Dental Screening: Recommended annual dental exams for proper oral hygiene  Community Resource Referral / Chronic Care Management: CRR required this visit?  No   CCM required this visit?  No      Plan:     I have personally reviewed and noted the following in the patient's chart:   Medical and social history Use of alcohol, tobacco or illicit drugs  Current medications and supplements including opioid prescriptions. Patient is not currently taking opioid prescriptions. Functional ability and status Nutritional status Physical activity Advanced directives List of other physicians Hospitalizations, surgeries, and ER visits in previous 12 months Vitals Screenings to include cognitive, depression, and falls Referrals and appointments  In addition, I have reviewed and discussed with patient certain preventive protocols, quality metrics, and best practice recommendations. A written personalized care plan for preventive services as well as general preventive health recommendations were provided to patient.     Kellie Simmering, LPN   11/05/7979   Nurse Notes: none

## 2021-07-31 ENCOUNTER — Other Ambulatory Visit: Payer: Self-pay | Admitting: Family Medicine

## 2021-07-31 DIAGNOSIS — E118 Type 2 diabetes mellitus with unspecified complications: Secondary | ICD-10-CM

## 2021-08-13 ENCOUNTER — Other Ambulatory Visit: Payer: Self-pay | Admitting: Family Medicine

## 2021-08-13 DIAGNOSIS — E118 Type 2 diabetes mellitus with unspecified complications: Secondary | ICD-10-CM

## 2021-09-03 ENCOUNTER — Encounter (HOSPITAL_COMMUNITY): Payer: Self-pay

## 2021-09-03 ENCOUNTER — Emergency Department (HOSPITAL_COMMUNITY)
Admission: EM | Admit: 2021-09-03 | Discharge: 2021-09-04 | Disposition: A | Discharge status: Home / Self Care | Payer: Medicare PPO | Attending: Emergency Medicine | Admitting: Emergency Medicine

## 2021-09-03 ENCOUNTER — Other Ambulatory Visit: Payer: Self-pay

## 2021-09-03 ENCOUNTER — Emergency Department (HOSPITAL_COMMUNITY): Payer: Medicare PPO

## 2021-09-03 DIAGNOSIS — T59811A Toxic effect of smoke, accidental (unintentional), initial encounter: Secondary | ICD-10-CM | POA: Insufficient documentation

## 2021-09-03 DIAGNOSIS — Z96652 Presence of left artificial knee joint: Secondary | ICD-10-CM | POA: Diagnosis not present

## 2021-09-03 DIAGNOSIS — Z87891 Personal history of nicotine dependence: Secondary | ICD-10-CM | POA: Insufficient documentation

## 2021-09-03 DIAGNOSIS — R000 Tachycardia, unspecified: Secondary | ICD-10-CM | POA: Diagnosis not present

## 2021-09-03 DIAGNOSIS — Z79899 Other long term (current) drug therapy: Secondary | ICD-10-CM | POA: Diagnosis not present

## 2021-09-03 DIAGNOSIS — T5891XA Toxic effect of carbon monoxide from unspecified source, accidental (unintentional), initial encounter: Secondary | ICD-10-CM | POA: Diagnosis not present

## 2021-09-03 DIAGNOSIS — J705 Respiratory conditions due to smoke inhalation: Secondary | ICD-10-CM | POA: Insufficient documentation

## 2021-09-03 DIAGNOSIS — Z20822 Contact with and (suspected) exposure to covid-19: Secondary | ICD-10-CM | POA: Insufficient documentation

## 2021-09-03 DIAGNOSIS — Z7984 Long term (current) use of oral hypoglycemic drugs: Secondary | ICD-10-CM | POA: Insufficient documentation

## 2021-09-03 DIAGNOSIS — R0602 Shortness of breath: Secondary | ICD-10-CM | POA: Insufficient documentation

## 2021-09-03 DIAGNOSIS — R0902 Hypoxemia: Secondary | ICD-10-CM | POA: Diagnosis not present

## 2021-09-03 DIAGNOSIS — E119 Type 2 diabetes mellitus without complications: Secondary | ICD-10-CM | POA: Insufficient documentation

## 2021-09-03 DIAGNOSIS — I10 Essential (primary) hypertension: Secondary | ICD-10-CM | POA: Insufficient documentation

## 2021-09-03 DIAGNOSIS — R918 Other nonspecific abnormal finding of lung field: Secondary | ICD-10-CM | POA: Diagnosis not present

## 2021-09-03 DIAGNOSIS — R609 Edema, unspecified: Secondary | ICD-10-CM | POA: Diagnosis not present

## 2021-09-03 DIAGNOSIS — T59814A Toxic effect of smoke, undetermined, initial encounter: Secondary | ICD-10-CM | POA: Diagnosis not present

## 2021-09-03 DIAGNOSIS — G4489 Other headache syndrome: Secondary | ICD-10-CM | POA: Diagnosis not present

## 2021-09-03 MED ORDER — IPRATROPIUM-ALBUTEROL 0.5-2.5 (3) MG/3ML IN SOLN
3.0000 mL | Freq: Once | RESPIRATORY_TRACT | Status: AC
  Administered 2021-09-04: 3 mL via RESPIRATORY_TRACT
  Filled 2021-09-03: qty 3

## 2021-09-03 NOTE — ED Notes (Signed)
Radiology at bedside

## 2021-09-03 NOTE — ED Notes (Signed)
Provider at bedside
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
## 2021-09-04 ENCOUNTER — Telehealth: Payer: Self-pay

## 2021-09-04 LAB — COOXEMETRY PANEL
Carboxyhemoglobin: 3 % — ABNORMAL HIGH (ref 0.5–1.5)
Methemoglobin: 0.8 % (ref 0.0–1.5)
O2 Saturation: 84.1 %
Total hemoglobin: 11.8 g/dL — ABNORMAL LOW (ref 12.0–16.0)

## 2021-09-04 LAB — BASIC METABOLIC PANEL
Anion gap: 14 (ref 5–15)
BUN: 18 mg/dL (ref 8–23)
CO2: 22 mmol/L (ref 22–32)
Calcium: 8.9 mg/dL (ref 8.9–10.3)
Chloride: 103 mmol/L (ref 98–111)
Creatinine, Ser: 0.97 mg/dL (ref 0.61–1.24)
GFR, Estimated: >60 mL/min (ref >60)
Glucose, Bld: 154 mg/dL — ABNORMAL HIGH (ref 70–99)
Potassium: 4.3 mmol/L (ref 3.5–5.1)
Sodium: 139 mmol/L (ref 135–145)

## 2021-09-04 LAB — CBC WITH DIFFERENTIAL/PLATELET
Abs Immature Granulocytes: 0.04 10*3/uL (ref 0.00–0.07)
Basophils Absolute: 0 10*3/uL (ref 0.0–0.1)
Basophils Relative: 0 %
Eosinophils Absolute: 0 10*3/uL (ref 0.0–0.5)
Eosinophils Relative: 0 %
HCT: 39.4 % (ref 39.0–52.0)
Hemoglobin: 13.1 g/dL (ref 13.0–17.0)
Immature Granulocytes: 0 %
Lymphocytes Relative: 10 %
Lymphs Abs: 0.9 10*3/uL (ref 0.7–4.0)
MCH: 31.3 pg (ref 26.0–34.0)
MCHC: 33.2 g/dL (ref 30.0–36.0)
MCV: 94.3 fL (ref 80.0–100.0)
Monocytes Absolute: 0.7 10*3/uL (ref 0.1–1.0)
Monocytes Relative: 8 %
Neutro Abs: 7.4 10*3/uL (ref 1.7–7.7)
Neutrophils Relative %: 82 %
Platelets: 257 10*3/uL (ref 150–400)
RBC: 4.18 MIL/uL — ABNORMAL LOW (ref 4.22–5.81)
RDW: 15.3 % (ref 11.5–15.5)
WBC: 9.1 10*3/uL (ref 4.0–10.5)
nRBC: 0 % (ref 0.0–0.2)

## 2021-09-04 LAB — RESP PANEL BY RT-PCR (FLU A&B, COVID) ARPGX2
Influenza A by PCR: NEGATIVE
Influenza B by PCR: NEGATIVE
SARS Coronavirus 2 by RT PCR: NEGATIVE

## 2021-09-04 LAB — CARBOXYHEMOGLOBIN - COOX: Carboxyhemoglobin: 18.8 % (ref 0.5–1.5)

## 2021-09-04 MED ORDER — IPRATROPIUM-ALBUTEROL 0.5-2.5 (3) MG/3ML IN SOLN
3.0000 mL | Freq: Once | RESPIRATORY_TRACT | Status: AC
  Administered 2021-09-04: 03:00:00 3 mL via RESPIRATORY_TRACT
  Filled 2021-09-04: qty 3

## 2021-09-04 MED ORDER — IPRATROPIUM-ALBUTEROL 0.5-2.5 (3) MG/3ML IN SOLN
3.0000 mL | Freq: Once | RESPIRATORY_TRACT | Status: AC
  Administered 2021-09-04: 05:00:00 3 mL via RESPIRATORY_TRACT
  Filled 2021-09-04: qty 3

## 2021-09-04 MED ORDER — DEXAMETHASONE 4 MG PO TABS
10.0000 mg | ORAL_TABLET | Freq: Once | ORAL | Status: AC
  Administered 2021-09-04: 05:00:00 10 mg via ORAL
  Filled 2021-09-04: qty 3

## 2021-09-04 MED ORDER — ALBUTEROL SULFATE HFA 108 (90 BASE) MCG/ACT IN AERS: 2.0000 | INHALATION_SPRAY | RESPIRATORY_TRACT | 0 refills | Status: DC | PRN

## 2021-09-04 NOTE — ED Notes (Signed)
Provider at bedside

## 2021-09-04 NOTE — Discharge Instructions (Addendum)
Use the inhaler as needed if you are having any problems with being short of breath, wheezing, or coughing.

## 2021-09-04 NOTE — ED Notes (Signed)
Pt placed on bedpan at this time to have a bm

## 2021-09-04 NOTE — ED Notes (Signed)
Attempted to ambulate pt with a walker and daughter at side. Pt was physically unable to do so. He was unsteady on his feet and almost feel. During this process pt O2 sats maintained 95-96%. Per daughter he has a difficult time and that it was probably due to him laying in the bed and getting stiff. When asked how would she get him home she stated that she had a wheelchair in the car and could get him home. Provider made aware as well
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
## 2021-09-04 NOTE — ED Notes (Signed)
Per provider pt needed to be on 100% O2. Pt placed on a NRB 15lpm at this time

## 2021-09-10 ENCOUNTER — Ambulatory Visit: Payer: Medicare PPO | Admitting: Family Medicine

## 2021-09-10 ENCOUNTER — Other Ambulatory Visit: Payer: Self-pay

## 2021-09-10 ENCOUNTER — Encounter: Payer: Self-pay | Admitting: Family Medicine

## 2021-09-10 VITALS — BP 122/72 | HR 79 | Temp 98.1°F

## 2021-09-10 DIAGNOSIS — J189 Pneumonia, unspecified organism: Secondary | ICD-10-CM

## 2021-09-10 DIAGNOSIS — T59811A Toxic effect of smoke, accidental (unintentional), initial encounter: Secondary | ICD-10-CM | POA: Diagnosis not present

## 2021-09-10 MED ORDER — AZITHROMYCIN 500 MG PO TABS: 500.0000 mg | ORAL_TABLET | Freq: Every day | ORAL | 0 refills | Status: DC
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
## 2021-09-25 ENCOUNTER — Other Ambulatory Visit: Payer: Self-pay | Admitting: Family Medicine

## 2021-09-25 DIAGNOSIS — E1159 Type 2 diabetes mellitus with other circulatory complications: Secondary | ICD-10-CM

## 2021-09-25 DIAGNOSIS — E1169 Type 2 diabetes mellitus with other specified complication: Secondary | ICD-10-CM

## 2021-09-25 DIAGNOSIS — E785 Hyperlipidemia, unspecified: Secondary | ICD-10-CM

## 2021-11-18 ENCOUNTER — Ambulatory Visit (INDEPENDENT_AMBULATORY_CARE_PROVIDER_SITE_OTHER): Payer: Medicare PPO | Admitting: Medical

## 2021-11-18 VITALS — BP 104/64 | HR 72 | Temp 97.1°F | Wt 172.0 lb

## 2021-11-18 DIAGNOSIS — E118 Type 2 diabetes mellitus with unspecified complications: Secondary | ICD-10-CM | POA: Diagnosis not present

## 2021-11-18 DIAGNOSIS — T50905A Adverse effect of unspecified drugs, medicaments and biological substances, initial encounter: Secondary | ICD-10-CM

## 2021-11-18 DIAGNOSIS — R350 Frequency of micturition: Secondary | ICD-10-CM | POA: Diagnosis not present

## 2021-11-18 DIAGNOSIS — R32 Unspecified urinary incontinence: Secondary | ICD-10-CM | POA: Diagnosis not present

## 2021-11-18 LAB — POCT URINALYSIS DIP (PROADVANTAGE DEVICE)
Bilirubin, UA: NEGATIVE
Glucose, UA: >=1000 mg/dL — AB
Nitrite, UA: NEGATIVE
Protein Ur, POC: NEGATIVE mg/dL
Specific Gravity, Urine: 1.02
Urobilinogen, Ur: NEGATIVE
pH, UA: 6 (ref 5.0–8.0)

## 2021-11-18 LAB — POCT GLYCOSYLATED HEMOGLOBIN (HGB A1C): Hemoglobin A1C: 7.3 % — AB (ref 4.0–5.6)

## 2021-11-18 NOTE — Progress Notes (Signed)
Subjective: ? Joe Barry is a 88 y.o. male who presents for ?Chief Complaint  ?Patient presents with  ? Urinary Tract Infection  ?  Possible uti, back pain, increase urination  since saturday  ?   ?Here today with caregiver.  His daughter is his caregiver. ? ?Here for complaints of urinary frequency for several weeks or more.  Sometimes gets discomfort in the belly or back.  No burning with urination.  No blood in the urine.  No odor in urine.  He does wear depends due to urinary incontinence.  No penile irritation redness or discharge.  Sometimes he has testicle discomfort. ? ?No fever body aches or chills.  No nausea or vomiting. ? ?Blood sugars have been running in the 140-160 range fasting.  Currently taking Actoplus MET, Januvia, and Jardiance.  Vania Rea was started sometime in the fall 2022.  He was here sometime in recent months for urinary tract infection ? ?He denies change in urinary stream or dribbling.   ? ?No other aggravating or relieving factors.   ? ?No other c/o. ? ?Past Medical History:  ?Diagnosis Date  ? AAA (abdominal aortic aneurysm)   ? Arthritis   ? BPH (benign prostatic hypertrophy)   ? Cataract   ? Diabetes mellitus   ? Dyslipidemia   ? Hypertension   ? Dr. Matthew Folks,   ? Neuromuscular disorder ([place])   ? carpal tunnel in right wrist  ? No blood products   ? Refusal of blood transfusions as patient is Jehovah's Witness   ? ?Current Outpatient Medications on File Prior to Visit  ?Medication Sig Dispense Refill  ? acetaminophen (TYLENOL) 500 MG tablet Take 500 mg by mouth every 6 (six) hours as needed for moderate pain.    ? amLODipine (NORVASC) 10 MG tablet Take 1 tablet (10 mg total) by mouth daily. 90 tablet 3  ? COMBIGAN 0.2-0.5 % ophthalmic solution Place 1 drop into both eyes every 12 (twelve) hours.   3  ? glucose blood (ACCU-CHEK GUIDE) test strip CHECK BLOOD GLUCOSE TWICE DAILY 200 strip 1  ? hydrochlorothiazide (MICROZIDE) 12.5 MG capsule TAKE 1 CAPSULE(12.5 MG) BY MOUTH DAILY 90  capsule 1  ? ibuprofen (ADVIL) 200 MG tablet Take 200 mg by mouth every 6 (six) hours as needed for mild pain.    ? JANUVIA 50 MG tablet TAKE 1 TABLET(50 MG) BY MOUTH DAILY (Patient taking differently: Take 50 mg by mouth daily.) 90 tablet 0  ? JARDIANCE 10 MG TABS tablet TAKE 1 TABLET BY MOUTH DAILY BEFORE BREAKFAST (Patient taking differently: 10 mg daily.) 90 tablet 0  ? LUMIGAN 0.01 % SOLN Place 1 drop into both eyes at bedtime.   3  ? pioglitazone-metformin (ACTOPLUS MET) 15-850 MG tablet Take 1 tablet by mouth 2 (two) times daily with a meal. 180 tablet 1  ? Accu-Chek FastClix Lancets MISC USE AS DIRECTED 102 each 0  ? Blood Glucose Monitoring Suppl (FORA V12 BLOOD GLUCOSE SYSTEM) [place] Testing once a day    ? colchicine 0.6 MG tablet Take 0.6 mg by mouth daily as needed. For gout (Patient not taking: No sig reported)    ? Lancet Devices (LITE TOUCH LANCING DEVICE) MISC     ? lovastatin (MEVACOR) 40 MG tablet TAKE 1 TABLET(40 MG) BY MOUTH AT BEDTIME 90 tablet 1  ? ?No current facility-administered medications on file prior to visit.  ? ? ? ?The following portions of the patient's history were reviewed and updated as appropriate: allergies, current  medications, past family history, past medical history, past social history, past surgical history and problem list. ? ?ROS ?Otherwise as in subjective above ? ?Objective: ?BP 104/64   Pulse 72   Temp (!) 97.1 ?F (36.2 ?C)   Wt 172 lb (78 kg)   BMI 28.62 kg/m?  ? ?General appearance: alert, no distress, well developed, well nourished, African-American, seated in wheelchair ?Abdomen: +bs, soft, non tender, non distended, no masses, no hepatomegaly, no splenomegaly ?Back nontender  ?Pulses: 2+ radial pulses, 2+ pedal pulses, normal cap refill ?Ext: no edema ?GU: Uncircumcised, no obvious erythema or discharge, nontender, no mass or swelling ? ? ? ?Assessment: ?Encounter Diagnoses  ?Name Primary?  ? Type 2 diabetes mellitus with complications ([place]) Yes  ? Urinary  incontinence, unspecified type   ? Urinary frequency   ? Adverse effect of drug, initial encounter   ? ? ? ?Plan: ?We discussed exam findings, concerns.  Urinalysis reviewed. ? ?I will send urine for culture, but the accuracy is still questionable given that he wears depends and there could be some contamination ? ?Interestingly his symptoms have been going on in recent months and his Vania Rea was started in her recent months.  I suspect Jardiance could be causing some of his problems ? ?They want to come off of that medicine for the time being.  He will continue Actoplus MET and Januvia but will stop Jardiance for the time being.  He was on insulin at 1 point but his daughter said that he did not want to use it at times.  If we start this back she will have to be the one giving it to him daily.  They are reluctant to do that ? ?Continue to work on good hygiene and good water intake. ? ?Continue glucose monitoring. ? ? ?Joe Barry was seen today for urinary tract infection. ? ?Diagnoses and all orders for this visit: ? ?Type 2 diabetes mellitus with complications ([place]) ?-     HgB A1c ? ?Urinary incontinence, unspecified type ?-     Urine Culture ?-     POCT Urinalysis DIP (Proadvantage Device) ? ?Urinary frequency ?-     Urine Culture ?-     POCT Urinalysis DIP (Proadvantage Device) ? ?Adverse effect of drug, initial encounter ? ? ? ?Follow up: pending labs ?

## 2021-11-20 LAB — URINE CULTURE

## 2021-11-21 ENCOUNTER — Other Ambulatory Visit: Payer: Self-pay | Admitting: Medical

## 2021-11-21 MED ORDER — CIPROFLOXACIN HCL 500 MG PO TABS: 500.0000 mg | ORAL_TABLET | Freq: Two times a day (BID) | ORAL | 0 refills | Status: AC

## 2021-12-02 ENCOUNTER — Emergency Department (HOSPITAL_COMMUNITY): Payer: Medicare PPO

## 2021-12-02 ENCOUNTER — Ambulatory Visit: Payer: Medicare PPO | Admitting: Family Medicine

## 2021-12-02 ENCOUNTER — Emergency Department (HOSPITAL_COMMUNITY)
Admission: EM | Admit: 2021-12-02 | Discharge: 2021-12-02 | Disposition: A | Discharge status: Home / Self Care | Payer: Medicare PPO | Attending: Emergency Medicine | Admitting: Emergency Medicine

## 2021-12-02 ENCOUNTER — Other Ambulatory Visit: Payer: Self-pay

## 2021-12-02 DIAGNOSIS — Z79899 Other long term (current) drug therapy: Secondary | ICD-10-CM | POA: Diagnosis not present

## 2021-12-02 DIAGNOSIS — R109 Unspecified abdominal pain: Secondary | ICD-10-CM | POA: Diagnosis not present

## 2021-12-02 DIAGNOSIS — N281 Cyst of kidney, acquired: Secondary | ICD-10-CM | POA: Diagnosis not present

## 2021-12-02 DIAGNOSIS — N400 Enlarged prostate without lower urinary tract symptoms: Secondary | ICD-10-CM | POA: Diagnosis not present

## 2021-12-02 DIAGNOSIS — R1084 Generalized abdominal pain: Secondary | ICD-10-CM | POA: Diagnosis not present

## 2021-12-02 DIAGNOSIS — R339 Retention of urine, unspecified: Secondary | ICD-10-CM | POA: Diagnosis not present

## 2021-12-02 DIAGNOSIS — R338 Other retention of urine: Secondary | ICD-10-CM

## 2021-12-02 DIAGNOSIS — R102 Pelvic and perineal pain: Secondary | ICD-10-CM

## 2021-12-02 DIAGNOSIS — R1030 Lower abdominal pain, unspecified: Secondary | ICD-10-CM | POA: Diagnosis not present

## 2021-12-02 DIAGNOSIS — K5730 Diverticulosis of large intestine without perforation or abscess without bleeding: Secondary | ICD-10-CM | POA: Diagnosis not present

## 2021-12-02 DIAGNOSIS — N1330 Unspecified hydronephrosis: Secondary | ICD-10-CM | POA: Diagnosis not present

## 2021-12-02 LAB — URINALYSIS, ROUTINE W REFLEX MICROSCOPIC
Bilirubin Urine: NEGATIVE
Glucose, UA: NEGATIVE mg/dL
Ketones, ur: NEGATIVE mg/dL
Leukocytes,Ua: NEGATIVE
Nitrite: NEGATIVE
Protein, ur: NEGATIVE mg/dL
RBC / HPF: >50 RBC/hpf — ABNORMAL HIGH (ref 0–5)
Specific Gravity, Urine: 1.021 (ref 1.005–1.030)
pH: 5 (ref 5.0–8.0)

## 2021-12-02 LAB — CBC
HCT: 39.7 % (ref 39.0–52.0)
Hemoglobin: 12.6 g/dL — ABNORMAL LOW (ref 13.0–17.0)
MCH: 29.4 pg (ref 26.0–34.0)
MCHC: 31.7 g/dL (ref 30.0–36.0)
MCV: 92.5 fL (ref 80.0–100.0)
Platelets: 229 10*3/uL (ref 150–400)
RBC: 4.29 MIL/uL (ref 4.22–5.81)
RDW: 15.5 % (ref 11.5–15.5)
WBC: 7.2 10*3/uL (ref 4.0–10.5)
nRBC: 0 % (ref 0.0–0.2)

## 2021-12-02 LAB — COMPREHENSIVE METABOLIC PANEL
ALT: 11 U/L (ref 0–44)
AST: 22 U/L (ref 15–41)
Albumin: 3.9 g/dL (ref 3.5–5.0)
Alkaline Phosphatase: 60 U/L (ref 38–126)
Anion gap: 13 (ref 5–15)
BUN: 24 mg/dL — ABNORMAL HIGH (ref 8–23)
CO2: 23 mmol/L (ref 22–32)
Calcium: 9.4 mg/dL (ref 8.9–10.3)
Chloride: 98 mmol/L (ref 98–111)
Creatinine, Ser: 0.98 mg/dL (ref 0.61–1.24)
GFR, Estimated: >60 mL/min (ref >60)
Glucose, Bld: 187 mg/dL — ABNORMAL HIGH (ref 70–99)
Potassium: 4.3 mmol/L (ref 3.5–5.1)
Sodium: 134 mmol/L — ABNORMAL LOW (ref 135–145)
Total Bilirubin: 1.3 mg/dL — ABNORMAL HIGH (ref 0.3–1.2)
Total Protein: 8 g/dL (ref 6.5–8.1)

## 2021-12-02 LAB — LIPASE, BLOOD: Lipase: 45 U/L (ref 11–51)

## 2021-12-02 MED ORDER — ACETAMINOPHEN 325 MG PO TABS
650.0000 mg | ORAL_TABLET | Freq: Once | ORAL | Status: AC
  Administered 2021-12-02: 650 mg via ORAL
  Filled 2021-12-02: qty 2

## 2021-12-02 MED ORDER — TAMSULOSIN HCL 0.4 MG PO CAPS: 0.4000 mg | ORAL_CAPSULE | Freq: Every day | ORAL | 0 refills | Status: AC

## 2021-12-02 MED ORDER — IOHEXOL 300 MG/ML  SOLN
100.0000 mL | Freq: Once | INTRAMUSCULAR | Status: AC | PRN
  Administered 2021-12-02: 100 mL via INTRAVENOUS

## 2021-12-02 NOTE — Discharge Instructions (Signed)
Take Flomax as directed until you see urology later this week. ?Return for fevers, uncontrolled pain, no urine output in your Foley bag or new concerns. ?
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
## 2021-12-02 NOTE — ED Provider Notes (Signed)
Pt seen by Dr Jodi Mourning.  Please see his note.  UA not suggestive of UTI.  Pt stable for discharge, outpatient follow up with urology. ?  ?Joe Dibbles, MD ?12/04/21 [phone] ? ?
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
## 2021-12-05 ENCOUNTER — Other Ambulatory Visit: Payer: Self-pay | Admitting: Family Medicine

## 2021-12-05 DIAGNOSIS — E1159 Type 2 diabetes mellitus with other circulatory complications: Secondary | ICD-10-CM

## 2021-12-11 DIAGNOSIS — R338 Other retention of urine: Secondary | ICD-10-CM | POA: Diagnosis not present

## 2021-12-11 DIAGNOSIS — N401 Enlarged prostate with lower urinary tract symptoms: Secondary | ICD-10-CM | POA: Diagnosis not present

## 2021-12-23 ENCOUNTER — Telehealth: Payer: Self-pay

## 2021-12-23 ENCOUNTER — Other Ambulatory Visit: Payer: Self-pay

## 2021-12-23 DIAGNOSIS — R350 Frequency of micturition: Secondary | ICD-10-CM

## 2021-12-23 DIAGNOSIS — R32 Unspecified urinary incontinence: Secondary | ICD-10-CM

## 2021-12-23 DIAGNOSIS — Z466 Encounter for fitting and adjustment of urinary device: Secondary | ICD-10-CM

## 2021-12-23 NOTE — Telephone Encounter (Signed)
Pt is requesting a home health nurse to help with pt cath. Please advise if this is ok to do. Thanks KH ?

## 2021-12-23 NOTE — Telephone Encounter (Signed)
Order was placed. KH °

## 2021-12-28 ENCOUNTER — Other Ambulatory Visit: Payer: Self-pay | Admitting: Family Medicine

## 2021-12-28 DIAGNOSIS — E118 Type 2 diabetes mellitus with unspecified complications: Secondary | ICD-10-CM

## 2021-12-31 DIAGNOSIS — R338 Other retention of urine: Secondary | ICD-10-CM | POA: Diagnosis not present

## 2021-12-31 DIAGNOSIS — N3000 Acute cystitis without hematuria: Secondary | ICD-10-CM | POA: Diagnosis not present

## 2022-01-14 DIAGNOSIS — N3000 Acute cystitis without hematuria: Secondary | ICD-10-CM | POA: Diagnosis not present

## 2022-01-14 DIAGNOSIS — R338 Other retention of urine: Secondary | ICD-10-CM | POA: Diagnosis not present

## 2022-01-29 DIAGNOSIS — R338 Other retention of urine: Secondary | ICD-10-CM | POA: Diagnosis not present

## 2022-01-29 DIAGNOSIS — N3000 Acute cystitis without hematuria: Secondary | ICD-10-CM | POA: Diagnosis not present

## 2022-02-03 ENCOUNTER — Other Ambulatory Visit: Payer: Self-pay | Admitting: Urology

## 2022-02-20 ENCOUNTER — Other Ambulatory Visit: Payer: Self-pay | Admitting: Family Medicine

## 2022-02-20 DIAGNOSIS — E118 Type 2 diabetes mellitus with unspecified complications: Secondary | ICD-10-CM

## 2022-02-24 NOTE — Patient Instructions (Signed)
DUE TO COVID-19 ONLY TWO VISITORS  (aged 86 and older)  ARE ALLOWED TO COME WITH YOU AND STAY IN THE WAITING ROOM ONLY DURING PRE OP AND PROCEDURE.   **NO VISITORS ARE ALLOWED IN THE SHORT STAY AREA OR RECOVERY ROOM!!**  IF YOU WILL BE ADMITTED INTO THE HOSPITAL YOU ARE ALLOWED ONLY FOUR SUPPORT PEOPLE DURING VISITATION HOURS ONLY (7 AM -8PM)   The support person(s) must pass our screening, gel in and out, and wear a mask at all times, including in the patient's room. Patients must also wear a mask when staff or their support person are in the room. Visitors GUEST BADGE MUST BE WORN VISIBLY  One adult visitor may remain with you overnight and MUST be in the room by 8 P.M.     Your procedure is scheduled on: 03/11/22   Report to [facility] Main Entrance    Report to admitting at: 7:30 AM   Call this number if you have problems the morning of surgery [phone]   Do not eat food :After Midnight.   After Midnight you may have the following liquids until : 6:30 AM DAY OF SURGERY  Water Black Coffee (sugar ok, NO MILK/CREAM OR CREAMERS)  Tea (sugar ok, NO MILK/CREAM OR CREAMERS) regular and decaf                             Plain Jell-O (NO RED)                                           Fruit ices (not with fruit pulp, NO RED)                                     Popsicles (NO RED)                                                                  Juice: apple, WHITE grape, WHITE cranberry Sports drinks like Gatorade (NO RED) Clear broth(vegetable,chicken,beef)  FOLLOW BOWEL PREP AND ANY ADDITIONAL PRE OP INSTRUCTIONS YOU RECEIVED FROM YOUR SURGEON'S OFFICE!!!   Oral Hygiene is also important to reduce your risk of infection.                                    Remember - BRUSH YOUR TEETH THE MORNING OF SURGERY WITH YOUR REGULAR TOOTHPASTE   Do NOT smoke after Midnight   Take these medicines the morning of surgery with A SIP OF WATER: amlodipine.Tylenol as needed.  How to  Manage Your Diabetes Before and After Surgery  Why is it important to control my blood sugar before and after surgery? Improving blood sugar levels before and after surgery helps healing and can limit problems. A way of improving blood sugar control is eating a healthy diet by:  Eating less sugar and carbohydrates  Increasing activity/exercise  Talking with your doctor about reaching your blood sugar goals High blood sugars (greater than 180  mg/dL) can raise your risk of infections and slow your recovery, so you will need to focus on controlling your diabetes during the weeks before surgery. Make sure that the doctor who takes care of your diabetes knows about your planned surgery including the date and location.  How do I manage my blood sugar before surgery? Check your blood sugar at least 4 times a day, starting 2 days before surgery, to make sure that the level is not too high or low. Check your blood sugar the morning of your surgery when you wake up and every 2 hours until you get to the Short Stay unit. If your blood sugar is less than 70 mg/dL, you will need to treat for low blood sugar: Do not take insulin. Treat a low blood sugar (less than 70 mg/dL) with  cup of clear juice (cranberry or apple), 4 glucose tablets, OR glucose gel. Recheck blood sugar in 15 minutes after treatment (to make sure it is greater than 70 mg/dL). If your blood sugar is not greater than 70 mg/dL on recheck, call [phone] for further instructions. Report your blood sugar to the short stay nurse when you get to Short Stay.  If you are admitted to the hospital after surgery: Your blood sugar will be checked by the staff and you will probably be given insulin after surgery (instead of oral diabetes medicines) to make sure you have good blood sugar levels. The goal for blood sugar control after surgery is 80-180 mg/dL.   WHAT DO I DO ABOUT MY DIABETES MEDICATION?  Do not take oral diabetes medicines  (pills) the morning of surgery.  THE DAY BEFORE SURGERY, take [place] and pioglitazone as usual.       THE MORNING OF SURGERY, take   units of         insulin.  The day of surgery, DO NOT TAKE ANY ORAL DIABETIC MEDICATIONS DAY OF YOUR SURGERY  Bring CPAP mask and tubing day of surgery.                              You may not have any metal on your body including hair pins, jewelry, and body piercing             Do not wear lotions, powders, perfumes/cologne, or deodorant              Men may shave face and neck.   Do not bring valuables to the hospital. [facility] IS NOT             RESPONSIBLE   FOR VALUABLES.   Contacts, dentures or bridgework may not be worn into surgery.   Bring small overnight bag day of surgery.   DO NOT BRING YOUR HOME MEDICATIONS TO THE HOSPITAL. PHARMACY WILL DISPENSE MEDICATIONS LISTED ON YOUR MEDICATION LIST TO YOU DURING YOUR ADMISSION IN THE HOSPITAL!    Patients discharged on the day of surgery will not be allowed to drive home.  Someone NEEDS to stay with you for the first 24 hours after anesthesia.   Special Instructions: Bring a copy of your healthcare power of attorney and living will documents         the day of surgery if you haven't scanned them before.              Please read over the following fact sheets you were given: IF YOU HAVE QUESTIONS ABOUT YOUR PRE-OP INSTRUCTIONS  PLEASE CALL [phone]     [facility] Before surgery, you can play an important role.  Because skin is not sterile, your skin needs to be as free of germs as possible.  You can reduce the number of germs on your skin by washing with CHG (chlorahexidine gluconate) soap before surgery.  CHG is an antiseptic cleaner which kills germs and bonds with the skin to continue killing germs even after washing. Please DO NOT use if you have an allergy to CHG or antibacterial soaps.  If your skin becomes reddened/irritated stop using the CHG and inform your  nurse when you arrive at Short Stay. Do not shave (including legs and underarms) for at least 48 hours prior to the first CHG shower.  You may shave your face/neck. Please follow these instructions carefully:  1.  Shower with CHG Soap the night before surgery and the  morning of Surgery.  2.  If you choose to wash your hair, wash your hair first as usual with your  normal  shampoo.  3.  After you shampoo, rinse your hair and body thoroughly to remove the  shampoo.                           4.  Use CHG as you would any other liquid soap.  You can apply chg directly  to the skin and wash                       Gently with a scrungie or clean washcloth.  5.  Apply the CHG Soap to your body ONLY FROM THE NECK DOWN.   Do not use on face/ open                           Wound or open sores. Avoid contact with eyes, ears mouth and genitals (private parts).                       Wash face,  Genitals (private parts) with your normal soap.             6.  Wash thoroughly, paying special attention to the area where your surgery  will be performed.  7.  Thoroughly rinse your body with warm water from the neck down.  8.  DO NOT shower/wash with your normal soap after using and rinsing off  the CHG Soap.                9.  Pat yourself dry with a clean towel.            10.  Wear clean pajamas.            11.  Place clean sheets on your bed the night of your first shower and do not  sleep with pets. Day of Surgery : Do not apply any lotions/deodorants the morning of surgery.  Please wear clean clothes to the hospital/surgery center.  FAILURE TO FOLLOW THESE INSTRUCTIONS MAY RESULT IN THE CANCELLATION OF YOUR SURGERY PATIENT SIGNATURE_________________________________  NURSE SIGNATURE__________________________________  ________________________________________________________________________

## 2022-02-25 ENCOUNTER — Encounter (HOSPITAL_COMMUNITY): Payer: Self-pay

## 2022-02-25 ENCOUNTER — Other Ambulatory Visit: Payer: Self-pay

## 2022-02-25 ENCOUNTER — Encounter (HOSPITAL_COMMUNITY)
Admission: RE | Admit: 2022-02-25 | Discharge: 2022-02-25 | Disposition: A | Discharge status: Home / Self Care | Payer: Medicare PPO | Source: Ambulatory Visit | Attending: Urology | Admitting: Urology

## 2022-02-25 VITALS — BP 132/69 | HR 92 | Temp 98.2°F | Ht 65.0 in | Wt 160.0 lb

## 2022-02-25 DIAGNOSIS — Z01818 Encounter for other preprocedural examination: Secondary | ICD-10-CM | POA: Insufficient documentation

## 2022-02-25 DIAGNOSIS — E1159 Type 2 diabetes mellitus with other circulatory complications: Secondary | ICD-10-CM | POA: Insufficient documentation

## 2022-02-25 DIAGNOSIS — I152 Hypertension secondary to endocrine disorders: Secondary | ICD-10-CM | POA: Diagnosis not present

## 2022-02-25 DIAGNOSIS — E118 Type 2 diabetes mellitus with unspecified complications: Secondary | ICD-10-CM

## 2022-02-25 LAB — BASIC METABOLIC PANEL
Anion gap: 9 (ref 5–15)
BUN: 20 mg/dL (ref 8–23)
CO2: 24 mmol/L (ref 22–32)
Calcium: 9.1 mg/dL (ref 8.9–10.3)
Chloride: 102 mmol/L (ref 98–111)
Creatinine, Ser: 1.04 mg/dL (ref 0.61–1.24)
GFR, Estimated: >60 mL/min (ref >60)
Glucose, Bld: 144 mg/dL — ABNORMAL HIGH (ref 70–99)
Potassium: 4.1 mmol/L (ref 3.5–5.1)
Sodium: 135 mmol/L (ref 135–145)

## 2022-02-25 LAB — HEMOGLOBIN A1C
Hgb A1c MFr Bld: 7.4 % — ABNORMAL HIGH (ref 4.8–5.6)
Mean Plasma Glucose: 165.68 mg/dL

## 2022-02-25 LAB — GLUCOSE, CAPILLARY: Glucose-Capillary: 145 mg/dL — ABNORMAL HIGH (ref 70–99)

## 2022-02-25 LAB — CBC
HCT: 36.7 % — ABNORMAL LOW (ref 39.0–52.0)
Hemoglobin: 11.9 g/dL — ABNORMAL LOW (ref 13.0–17.0)
MCH: 29.6 pg (ref 26.0–34.0)
MCHC: 32.4 g/dL (ref 30.0–36.0)
MCV: 91.3 fL (ref 80.0–100.0)
Platelets: 270 10*3/uL (ref 150–400)
RBC: 4.02 MIL/uL — ABNORMAL LOW (ref 4.22–5.81)
RDW: 16.3 % — ABNORMAL HIGH (ref 11.5–15.5)
WBC: 5.1 10*3/uL (ref 4.0–10.5)
nRBC: 0 % (ref 0.0–0.2)

## 2022-02-25 LAB — NO BLOOD PRODUCTS

## 2022-02-25 NOTE — Progress Notes (Signed)
Pt. Refused blood products. 

## 2022-02-25 NOTE — Progress Notes (Addendum)
For Short Stay: COVID SWAB appointment date: Date of COVID positive in last 90 days:  Bowel Prep reminder:   For Anesthesia: PCP - Dr. [name]. Cardiologist -   Chest x-ray - 09/03/21 EKG -  Stress Test -  ECHO - 04/30/16 Cardiac Cath -  Pacemaker/ICD device last checked: Pacemaker orders received: Device Rep notified:  Spinal Cord Stimulator:  Sleep Study -  CPAP -   Fasting Blood Sugar - N/A Checks Blood Sugar __0___ times a day Date and result of last Hgb A1c-7.3: 11/18/21  Blood Thinner Instructions: Aspirin Instructions: Last Dose:  Activity level: Can go up a flight of stairs and activities of daily living without stopping and without chest pain and/or shortness of breath   Able to exercise without chest pain and/or shortness of breath   Unable to go up a flight of stairs without chest pain and/or shortness of breath     Anesthesia review: Hx: HTN,DIA., AAA  Patient denies shortness of breath, fever, cough and chest pain at PAT appointment   Patient verbalized understanding of instructions that were given to them at the PAT appointment. Patient was also instructed that they will need to review over the PAT instructions again at home before surgery.

## 2022-03-10 MED ORDER — GENTAMICIN SULFATE 40 MG/ML IJ SOLN
5.0000 mg/kg | INTRAVENOUS | Status: AC
  Administered 2022-03-11: 360 mg via INTRAVENOUS
  Filled 2022-03-10: qty 9

## 2022-03-11 ENCOUNTER — Other Ambulatory Visit: Payer: Self-pay

## 2022-03-11 ENCOUNTER — Encounter (HOSPITAL_COMMUNITY)
Admission: RE | Disposition: A | Discharge status: Home / Self Care | Payer: Self-pay | Source: Ambulatory Visit | Attending: Urology

## 2022-03-11 ENCOUNTER — Observation Stay (HOSPITAL_COMMUNITY)
Admission: RE | Admit: 2022-03-11 | Discharge: 2022-03-12 | Disposition: A | Discharge status: Home / Self Care | Payer: Medicare PPO | Source: Ambulatory Visit | Attending: Urology | Admitting: Urology

## 2022-03-11 ENCOUNTER — Ambulatory Visit (HOSPITAL_COMMUNITY): Payer: Medicare PPO | Admitting: Physician Assistant

## 2022-03-11 ENCOUNTER — Ambulatory Visit (HOSPITAL_BASED_OUTPATIENT_CLINIC_OR_DEPARTMENT_OTHER): Payer: Medicare PPO | Admitting: Anesthesiology

## 2022-03-11 ENCOUNTER — Encounter (HOSPITAL_COMMUNITY): Payer: Self-pay | Admitting: Urology

## 2022-03-11 DIAGNOSIS — E119 Type 2 diabetes mellitus without complications: Secondary | ICD-10-CM

## 2022-03-11 DIAGNOSIS — R338 Other retention of urine: Secondary | ICD-10-CM

## 2022-03-11 DIAGNOSIS — Z7984 Long term (current) use of oral hypoglycemic drugs: Secondary | ICD-10-CM

## 2022-03-11 DIAGNOSIS — I10 Essential (primary) hypertension: Secondary | ICD-10-CM

## 2022-03-11 DIAGNOSIS — Z87891 Personal history of nicotine dependence: Secondary | ICD-10-CM | POA: Diagnosis not present

## 2022-03-11 DIAGNOSIS — N403 Nodular prostate with lower urinary tract symptoms: Secondary | ICD-10-CM | POA: Diagnosis not present

## 2022-03-11 DIAGNOSIS — N401 Enlarged prostate with lower urinary tract symptoms: Secondary | ICD-10-CM

## 2022-03-11 DIAGNOSIS — Z96652 Presence of left artificial knee joint: Secondary | ICD-10-CM | POA: Insufficient documentation

## 2022-03-11 DIAGNOSIS — N411 Chronic prostatitis: Secondary | ICD-10-CM | POA: Insufficient documentation

## 2022-03-11 DIAGNOSIS — Z01818 Encounter for other preprocedural examination: Secondary | ICD-10-CM

## 2022-03-11 DIAGNOSIS — N400 Enlarged prostate without lower urinary tract symptoms: Secondary | ICD-10-CM | POA: Diagnosis present

## 2022-03-11 HISTORY — PX: TRANSURETHRAL RESECTION OF PROSTATE: SHX73

## 2022-03-11 LAB — GLUCOSE, CAPILLARY
Glucose-Capillary: 197 mg/dL — ABNORMAL HIGH (ref 70–99)
Glucose-Capillary: 138 mg/dL — ABNORMAL HIGH (ref 70–99)
Glucose-Capillary: 360 mg/dL — ABNORMAL HIGH (ref 70–99)
Glucose-Capillary: 219 mg/dL — ABNORMAL HIGH (ref 70–99)

## 2022-03-11 LAB — HEMOGLOBIN AND HEMATOCRIT, BLOOD
HCT: 32 % — ABNORMAL LOW (ref 39.0–52.0)
Hemoglobin: 10.4 g/dL — ABNORMAL LOW (ref 13.0–17.0)

## 2022-03-11 SURGERY — TURP (TRANSURETHRAL RESECTION OF PROSTATE)
Anesthesia: General

## 2022-03-11 MED ORDER — AMLODIPINE BESYLATE 10 MG PO TABS
10.0000 mg | ORAL_TABLET | Freq: Every day | ORAL | Status: DC
  Administered 2022-03-12: 10 mg via ORAL
  Filled 2022-03-11: qty 1

## 2022-03-11 MED ORDER — SODIUM CHLORIDE 0.9 % IR SOLN
Status: DC | PRN
  Administered 2022-03-11: 27000 mL via INTRAVESICAL

## 2022-03-11 MED ORDER — ONDANSETRON HCL 4 MG/2ML IJ SOLN
INTRAMUSCULAR | Status: DC | PRN
  Administered 2022-03-11: 4 mg via INTRAVENOUS

## 2022-03-11 MED ORDER — PROPOFOL 10 MG/ML IV BOLUS
INTRAVENOUS | Status: AC
  Filled 2022-03-11: qty 20

## 2022-03-11 MED ORDER — FENTANYL CITRATE (PF) 100 MCG/2ML IJ SOLN
INTRAMUSCULAR | Status: AC
  Filled 2022-03-11: qty 2

## 2022-03-11 MED ORDER — SODIUM CHLORIDE 0.9 % IV SOLN: INTRAVENOUS | Status: DC

## 2022-03-11 MED ORDER — INSULIN ASPART 100 UNIT/ML IJ SOLN
0.0000 [IU] | Freq: Three times a day (TID) | INTRAMUSCULAR | Status: DC
  Administered 2022-03-11: 15 [IU] via SUBCUTANEOUS
  Administered 2022-03-12: 2 [IU] via SUBCUTANEOUS
  Administered 2022-03-12: 3 [IU] via SUBCUTANEOUS

## 2022-03-11 MED ORDER — LIDOCAINE HCL (PF) 2 % IJ SOLN
INTRAMUSCULAR | Status: AC
  Filled 2022-03-11: qty 5

## 2022-03-11 MED ORDER — ORAL CARE MOUTH RINSE: 15.0000 mL | Freq: Once | OROMUCOSAL | Status: AC

## 2022-03-11 MED ORDER — FLUCONAZOLE 100MG IVPB
100.0000 mg | INTRAVENOUS | Status: DC
  Administered 2022-03-11: 100 mg via INTRAVENOUS
  Filled 2022-03-11: qty 50

## 2022-03-11 MED ORDER — DEXAMETHASONE SODIUM PHOSPHATE 10 MG/ML IJ SOLN
INTRAMUSCULAR | Status: AC
Start: 2022-03-11 — End: ?
  Filled 2022-03-11: qty 1

## 2022-03-11 MED ORDER — LACTATED RINGERS IV SOLN: INTRAVENOUS | Status: DC

## 2022-03-11 MED ORDER — EPHEDRINE 5 MG/ML INJ
INTRAVENOUS | Status: AC
  Filled 2022-03-11: qty 5

## 2022-03-11 MED ORDER — ONDANSETRON HCL 4 MG/2ML IJ SOLN: 4.0000 mg | Freq: Once | INTRAMUSCULAR | Status: DC | PRN

## 2022-03-11 MED ORDER — HYDROCHLOROTHIAZIDE 12.5 MG PO CAPS
12.5000 mg | ORAL_CAPSULE | Freq: Every day | ORAL | Status: DC
  Filled 2022-03-11: qty 1

## 2022-03-11 MED ORDER — SUGAMMADEX SODIUM 200 MG/2ML IV SOLN
INTRAVENOUS | Status: DC | PRN
  Administered 2022-03-11: 200 mg via INTRAVENOUS

## 2022-03-11 MED ORDER — ACETAMINOPHEN 500 MG PO TABS
1000.0000 mg | ORAL_TABLET | Freq: Four times a day (QID) | ORAL | Status: AC
  Administered 2022-03-11 – 2022-03-12 (×4): 1000 mg via ORAL
  Filled 2022-03-11 (×4): qty 2

## 2022-03-11 MED ORDER — LIDOCAINE HCL (CARDIAC) PF 100 MG/5ML IV SOSY
PREFILLED_SYRINGE | INTRAVENOUS | Status: DC | PRN
  Administered 2022-03-11: 50 mg via INTRAVENOUS

## 2022-03-11 MED ORDER — ROCURONIUM BROMIDE 100 MG/10ML IV SOLN
INTRAVENOUS | Status: DC | PRN
  Administered 2022-03-11: 40 mg via INTRAVENOUS

## 2022-03-11 MED ORDER — ONDANSETRON HCL 4 MG/2ML IJ SOLN
INTRAMUSCULAR | Status: AC
  Filled 2022-03-11: qty 2

## 2022-03-11 MED ORDER — OXYCODONE HCL 5 MG/5ML PO SOLN: 5.0000 mg | Freq: Once | ORAL | Status: DC | PRN

## 2022-03-11 MED ORDER — SODIUM CHLORIDE 0.9 % IR SOLN
3000.0000 mL | Status: DC
  Administered 2022-03-11 – 2022-03-12 (×4): 3000 mL

## 2022-03-11 MED ORDER — OXYCODONE HCL 5 MG PO TABS: 5.0000 mg | ORAL_TABLET | Freq: Once | ORAL | Status: DC | PRN

## 2022-03-11 MED ORDER — AMISULPRIDE (ANTIEMETIC) 5 MG/2ML IV SOLN: 10.0000 mg | Freq: Once | INTRAVENOUS | Status: DC | PRN

## 2022-03-11 MED ORDER — HYDROCHLOROTHIAZIDE 12.5 MG PO TABS
12.5000 mg | ORAL_TABLET | Freq: Every day | ORAL | Status: DC
  Administered 2022-03-12: 12.5 mg via ORAL
  Filled 2022-03-11: qty 1

## 2022-03-11 MED ORDER — SENNOSIDES-DOCUSATE SODIUM 8.6-50 MG PO TABS
1.0000 | ORAL_TABLET | Freq: Two times a day (BID) | ORAL | Status: DC
  Administered 2022-03-11 – 2022-03-12 (×2): 1 via ORAL
  Filled 2022-03-11 (×2): qty 1

## 2022-03-11 MED ORDER — FENTANYL CITRATE (PF) 100 MCG/2ML IJ SOLN
INTRAMUSCULAR | Status: DC | PRN
  Administered 2022-03-11 (×3): 25 ug via INTRAVENOUS

## 2022-03-11 MED ORDER — FENTANYL CITRATE PF 50 MCG/ML IJ SOSY: 25.0000 ug | PREFILLED_SYRINGE | INTRAMUSCULAR | Status: DC | PRN

## 2022-03-11 MED ORDER — DEXAMETHASONE SODIUM PHOSPHATE 10 MG/ML IJ SOLN
INTRAMUSCULAR | Status: DC | PRN
  Administered 2022-03-11: 8 mg via INTRAVENOUS

## 2022-03-11 MED ORDER — PROPOFOL 10 MG/ML IV BOLUS
INTRAVENOUS | Status: DC | PRN
  Administered 2022-03-11: 150 mg via INTRAVENOUS
  Administered 2022-03-11 (×2): 50 mg via INTRAVENOUS

## 2022-03-11 MED ORDER — CHLORHEXIDINE GLUCONATE 0.12 % MT SOLN
15.0000 mL | Freq: Once | OROMUCOSAL | Status: AC
  Administered 2022-03-11: 15 mL via OROMUCOSAL

## 2022-03-11 MED ORDER — INSULIN ASPART 100 UNIT/ML IJ SOLN
0.0000 [IU] | Freq: Every day | INTRAMUSCULAR | Status: DC
  Administered 2022-03-11: 2 [IU] via SUBCUTANEOUS

## 2022-03-11 MED ORDER — EPHEDRINE SULFATE-NACL 50-0.9 MG/10ML-% IV SOSY
PREFILLED_SYRINGE | INTRAVENOUS | Status: DC | PRN
  Administered 2022-03-11: 10 mg via INTRAVENOUS

## 2022-03-11 MED ORDER — INSULIN ASPART 100 UNIT/ML IJ SOLN
4.0000 [IU] | Freq: Three times a day (TID) | INTRAMUSCULAR | Status: DC
  Administered 2022-03-11 – 2022-03-12 (×3): 4 [IU] via SUBCUTANEOUS

## 2022-03-11 MED ORDER — OXYCODONE HCL 5 MG PO TABS: 5.0000 mg | ORAL_TABLET | ORAL | Status: DC | PRN

## 2022-03-11 MED ORDER — FINASTERIDE 5 MG PO TABS
5.0000 mg | ORAL_TABLET | Freq: Every day | ORAL | Status: DC
  Administered 2022-03-11 – 2022-03-12 (×2): 5 mg via ORAL
  Filled 2022-03-11 (×2): qty 1

## 2022-03-11 MED ORDER — HYDROMORPHONE HCL 1 MG/ML IJ SOLN: 0.5000 mg | INTRAMUSCULAR | Status: DC | PRN

## 2022-03-11 SURGICAL SUPPLY — 22 items
BAG URINE DRAIN 2000ML AR STRL (UROLOGICAL SUPPLIES) ×2 IMPLANT
BAG URO CATCHER STRL LF (MISCELLANEOUS) ×2 IMPLANT
CATH FOLEY 3WAY 30CC 24FR (CATHETERS) ×2
CATH URTH STD 24FR FL 3W 2 (CATHETERS) IMPLANT
DRAPE FOOT SWITCH (DRAPES) ×2 IMPLANT
ELECT REM PT RETURN 15FT ADLT (MISCELLANEOUS) IMPLANT
GLOVE SURG LX 7.5 STRW (GLOVE) ×1
GLOVE SURG LX STRL 7.5 STRW (GLOVE) ×1 IMPLANT
GOWN SRG XL LVL 4 BRTHBL STRL (GOWNS) ×1 IMPLANT
GOWN STRL NON-REIN XL LVL4 (GOWNS) ×2
GUIDEWIRE STR DUAL SENSOR (WIRE) ×1 IMPLANT
HOLDER FOLEY CATH W/STRAP (MISCELLANEOUS) ×1 IMPLANT
IV CATH 14GX2 1/4 (CATHETERS) ×1 IMPLANT
KIT TURNOVER KIT A (KITS) ×1 IMPLANT
LOOP CUT BIPOLAR 24F LRG (ELECTROSURGICAL) ×2 IMPLANT
MANIFOLD NEPTUNE II (INSTRUMENTS) ×2 IMPLANT
PACK CYSTO (CUSTOM PROCEDURE TRAY) ×2 IMPLANT
SYR 30ML LL (SYRINGE) ×2 IMPLANT
SYR TOOMEY IRRIG 70ML (MISCELLANEOUS) ×2
SYRINGE TOOMEY IRRIG 70ML (MISCELLANEOUS) ×1 IMPLANT
TUBING CONNECTING 10 (TUBING) ×2 IMPLANT
TUBING UROLOGY SET (TUBING) ×2 IMPLANT

## 2022-03-11 NOTE — Anesthesia Postprocedure Evaluation (Signed)
Anesthesia Post Note  Patient: KENON DELASHMIT  Procedure(s) Performed: TRANSURETHRAL RESECTION OF THE PROSTATE (TURP)     Patient location during evaluation: PACU Anesthesia Type: General Level of consciousness: awake and alert, patient cooperative and oriented Pain management: pain level controlled Vital Signs Assessment: post-procedure vital signs reviewed and stable Respiratory status: spontaneous breathing, nonlabored ventilation and respiratory function stable Cardiovascular status: blood pressure returned to baseline and stable Postop Assessment: no apparent nausea or vomiting Anesthetic complications: no   No notable events documented.  Last Vitals:  Vitals:   03/11/22 1315 03/11/22 1416  BP: 131/70 139/81  Pulse: 92 90  Resp: 14 16  Temp:  36.7 C  SpO2: 93% 97%    Last Pain:  Vitals:   03/11/22 1416  TempSrc: Oral  PainSc:                  Laythan Hayter,E. Raeya Merritts
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
## 2022-03-11 NOTE — Plan of Care (Signed)
  Problem: Education: Goal: Knowledge of General Education information will improve Description: Including pain rating scale, medication(s)/side effects and non-pharmacologic comfort measures Outcome: Progressing   Problem: Elimination: Goal: Will not experience complications related to urinary retention Outcome: Progressing   Problem: Pain Managment: Goal: General experience of comfort will improve Outcome: Progressing   

## 2022-03-11 NOTE — Anesthesia Preprocedure Evaluation (Signed)
Anesthesia Evaluation  Patient identified by MRN, date of birth, ID band Patient awake    Reviewed: Allergy & Precautions, NPO status , Patient's Chart, lab work & pertinent test results  History of Anesthesia Complications Negative for: history of anesthetic complications  Airway Mallampati: I  TM Distance: >3 FB Neck ROM: Full    Dental  (+) Edentulous Upper, Edentulous Lower   Pulmonary neg pulmonary ROS, former smoker,    Pulmonary exam normal        Cardiovascular hypertension, Pt. on medications + Peripheral Vascular Disease  Normal cardiovascular exam   Echo 2017: EF 55-60%, g1dd   Neuro/Psych negative neurological ROS     GI/Hepatic negative GI ROS, Neg liver ROS,   Endo/Other  diabetes  Renal/GU negative Renal ROS   BPH, urinary retention    Musculoskeletal  (+) Arthritis ,   Abdominal   Peds  Hematology  (+) Blood dyscrasia, anemia , JEHOVAH'S WITNESS  Anesthesia Other Findings   Reproductive/Obstetrics                            Anesthesia Physical Anesthesia Plan  ASA: 3  Anesthesia Plan: General   Post-op Pain Management: Toradol IV (intra-op)* and Ofirmev IV (intra-op)*   Induction: Intravenous  PONV Risk Score and Plan: 2 and Ondansetron, Dexamethasone, Midazolam and Treatment may vary due to age or medical condition  Airway Management Planned: LMA  Additional Equipment: None  Intra-op Plan:   Post-operative Plan: Extubation in OR  Informed Consent: I have reviewed the patients History and Physical, chart, labs and discussed the procedure including the risks, benefits and alternatives for the proposed anesthesia with the patient or authorized representative who has indicated his/her understanding and acceptance.     Dental advisory given  Plan Discussed with:   Anesthesia Plan Comments:        Anesthesia Quick Evaluation

## 2022-03-11 NOTE — Discharge Instructions (Signed)
1 - You may have urinary urgency (bladder spasms) and bloody urine on / off for up to 3 weeks. This is normal. ° °2 - Call MD or go to ER for fever >102, severe pain / nausea / vomiting not relieved by medications, or acute change in medical status ° °

## 2022-03-11 NOTE — Anesthesia Procedure Notes (Signed)
Procedure Name: Intubation Date/Time: 03/11/2022 10:06 AM  Performed by: Lind Covert, CRNAPre-anesthesia Checklist: Patient identified, Emergency Drugs available, Suction available, Patient being monitored and Timeout performed Patient Re-evaluated:Patient Re-evaluated prior to induction Oxygen Delivery Method: Circle system utilized Preoxygenation: Pre-oxygenation with 100% oxygen Induction Type: IV induction Ventilation: Mask ventilation without difficulty Laryngoscope Size: Mac and 4 Grade View: Grade II Tube type: Oral Tube size: 7.5 (Attempted LMA 4 x 2 unable to retrieve CO2, Large amount of oral secretions suctioned. Decision to convert to ETT.) mm Number of attempts: 1 Airway Equipment and Method: Stylet Placement Confirmation: ETT inserted through vocal cords under direct vision, positive ETCO2 and breath sounds checked- equal and bilateral Secured at: 22 cm Tube secured with: Tape Dental Injury: Teeth and Oropharynx as per pre-operative assessment  Comments: Unable to get  LMA 4 to seat. Decision to convert to ETT.

## 2022-03-11 NOTE — Transfer of Care (Signed)
Immediate Anesthesia Transfer of Care Note  Patient: Joe Barry  Procedure(s) Performed: TRANSURETHRAL RESECTION OF THE PROSTATE (TURP)  Patient Location: PACU  Anesthesia Type:General  Level of Consciousness: awake, alert , oriented and patient cooperative  Airway & Oxygen Therapy: Patient Spontanous Breathing and Patient connected to face mask oxygen  Post-op Assessment: Report given to RN, Post -op Vital signs reviewed and stable and Patient moving all extremities  Post vital signs: Reviewed and stable  Last Vitals:  Vitals Value Taken Time  BP 128/87 03/11/22 1142  Temp    Pulse 83 03/11/22 1144  Resp 19 03/11/22 1144  SpO2 100 % 03/11/22 1144  Vitals shown include unvalidated device data.  Last Pain:  Vitals:   03/11/22 0803  TempSrc:   PainSc: 0-No pain         Complications: No notable events documented.

## 2022-03-11 NOTE — Brief Op Note (Signed)
03/11/2022  11:30 AM  PATIENT:  Joe Barry  88 y.o. male  PRE-OPERATIVE DIAGNOSIS:  PROSTATIC HYPERTROPHY, RETENTION  POST-OPERATIVE DIAGNOSIS:  PROSTATIC HYPERTROPHY, RETENTION  PROCEDURE:  Procedure(s) with comments: TRANSURETHRAL RESECTION OF THE PROSTATE (TURP) (N/A) - 75 MINS  SURGEON:  Surgeon(s) and Role:    Sebastian Ache, MD - Primary  PHYSICIAN ASSISTANT:   ASSISTANTS: none   ANESTHESIA:   general  EBL:    BLOOD ADMINISTERED:none  DRAINS:  3 way foley to NS irrigation    LOCAL MEDICATIONS USED:  NONE  SPECIMEN:  Source of Specimen:  prostate chips  DISPOSITION OF SPECIMEN:   path (gross only)  COUNTS:  YES  TOURNIQUET:  * No tourniquets in log *  DICTATION: .Other Dictation: Dictation Number [id number]  PLAN OF CARE: Admit for overnight observation  PATIENT DISPOSITION:  PACU - hemodynamically stable.   Delay start of Pharmacological VTE agent (>24hrs) due to surgical blood loss or risk of bleeding: yes

## 2022-03-12 ENCOUNTER — Encounter (HOSPITAL_COMMUNITY): Payer: Self-pay | Admitting: Urology

## 2022-03-12 DIAGNOSIS — N401 Enlarged prostate with lower urinary tract symptoms: Secondary | ICD-10-CM | POA: Diagnosis not present

## 2022-03-12 DIAGNOSIS — N411 Chronic prostatitis: Secondary | ICD-10-CM | POA: Diagnosis not present

## 2022-03-12 DIAGNOSIS — Z87891 Personal history of nicotine dependence: Secondary | ICD-10-CM | POA: Diagnosis not present

## 2022-03-12 DIAGNOSIS — Z96652 Presence of left artificial knee joint: Secondary | ICD-10-CM | POA: Diagnosis not present

## 2022-03-12 DIAGNOSIS — R338 Other retention of urine: Secondary | ICD-10-CM | POA: Diagnosis not present

## 2022-03-12 DIAGNOSIS — E119 Type 2 diabetes mellitus without complications: Secondary | ICD-10-CM | POA: Diagnosis not present

## 2022-03-12 DIAGNOSIS — I10 Essential (primary) hypertension: Secondary | ICD-10-CM | POA: Diagnosis not present

## 2022-03-12 LAB — BASIC METABOLIC PANEL
Anion gap: 7 (ref 5–15)
BUN: 18 mg/dL (ref 8–23)
CO2: 25 mmol/L (ref 22–32)
Calcium: 8.5 mg/dL — ABNORMAL LOW (ref 8.9–10.3)
Chloride: 106 mmol/L (ref 98–111)
Creatinine, Ser: 0.93 mg/dL (ref 0.61–1.24)
GFR, Estimated: >60 mL/min (ref >60)
Glucose, Bld: 160 mg/dL — ABNORMAL HIGH (ref 70–99)
Potassium: 4.5 mmol/L (ref 3.5–5.1)
Sodium: 138 mmol/L (ref 135–145)

## 2022-03-12 LAB — GLUCOSE, CAPILLARY
Glucose-Capillary: 148 mg/dL — ABNORMAL HIGH (ref 70–99)
Glucose-Capillary: 180 mg/dL — ABNORMAL HIGH (ref 70–99)

## 2022-03-12 LAB — HEMOGLOBIN AND HEMATOCRIT, BLOOD
HCT: 30.1 % — ABNORMAL LOW (ref 39.0–52.0)
Hemoglobin: 9.8 g/dL — ABNORMAL LOW (ref 13.0–17.0)

## 2022-03-12 LAB — SURGICAL PATHOLOGY

## 2022-03-12 MED ORDER — CHLORHEXIDINE GLUCONATE CLOTH 2 % EX PADS
6.0000 | MEDICATED_PAD | Freq: Every day | CUTANEOUS | Status: DC
  Administered 2022-03-12: 6 via TOPICAL

## 2022-03-12 MED ORDER — CEPHALEXIN 500 MG PO CAPS: 500.0000 mg | ORAL_CAPSULE | Freq: Two times a day (BID) | ORAL | 0 refills | Status: AC

## 2022-03-12 MED ORDER — HYDROCODONE-ACETAMINOPHEN 5-325 MG PO TABS: 1.0000 | ORAL_TABLET | Freq: Four times a day (QID) | ORAL | 0 refills | Status: DC | PRN

## 2022-03-12 NOTE — Plan of Care (Signed)

## 2022-03-12 NOTE — Discharge Summary (Signed)
Physician Discharge Summary  Patient ID: Joe Barry MRN: [id number] DOB/AGE: [birth year] 88 y.o.  Admit date: 03/11/2022 Discharge date: 03/12/2022  Admission Diagnoses: Prostatic Hypertrophy with Urinary Retention  Discharge Diagnoses:  Principal Problem:   Prostatic hyperplasia   Discharged Condition: fair  Hospital Course: Pt undwent large volume transurethral resection of prostate on 03/11/22, the day of admission, without acute complication. He was observed overnight on gentle bladder irrigation which was weaned to off. Hgb POD 1 9.8, pain controlled, ambulatory with assistance as per baseline, and felt to be adequate for discharges with foley. He will have trial of void in office next week.   Consults: None  Significant Diagnostic Studies: labs: as per above  Treatments: surgery: as per above  Discharge Exam: Blood pressure 133/75, pulse 82, temperature 98.3 F (36.8 C), temperature source Oral, resp. rate 18, height $RemoveBe'5\' 5"'JiWaZzScQ$  (1.651 m), weight 72.6 kg, SpO2 100 %. General appearance: alert, cooperative, and some fraily, at baselien, AOx3 and pleasant Eyes: negative Nose: Nares normal. Septum midline. Mucosa normal. No drainage or sinus tenderness. Throat: lips, mucosa, and tongue normal; teeth and gums normal Back: symmetric, no curvature. ROM normal. No CVA tenderness. Resp: non-labored on room air.  Cardio: Nl rate GI: stable moderate obesity with scars w/o hernias.  Male genitalia: normal, 3 way foley in place with irrigation port plugged and light pink urine without clots.  Extremities: stable LE edema to knee.   Disposition:    Allergies as of 03/12/2022       Reactions   Other    Pt. Refused blood products        Medication List     STOP taking these medications    tamsulosin 0.4 MG Caps capsule Commonly known as: FLOMAX       TAKE these medications    Accu-Chek FastClix Lancets Misc USE AS DIRECTED   Accu-Chek Guide test strip Generic drug:  glucose blood CHECK BLOOD GLUCOSE TWICE DAILY   acetaminophen 500 MG tablet Commonly known as: TYLENOL Take 500 mg by mouth every 6 (six) hours as needed for moderate pain.   amLODipine 10 MG tablet Commonly known as: NORVASC TAKE 1 TABLET(10 MG) BY MOUTH DAILY   cephALEXin 500 MG capsule Commonly known as: KEFLEX Take 1 capsule (500 mg total) by mouth 2 (two) times daily for 3 days. Begin day before next Urology appointment to prevent infection.   finasteride 5 MG tablet Commonly known as: PROSCAR Take 5 mg by mouth daily.   FORA V12 Blood Glucose System Devi Testing once a day   hydrochlorothiazide 12.5 MG capsule Commonly known as: MICROZIDE TAKE 1 CAPSULE(12.5 MG) BY MOUTH DAILY   HYDROcodone-acetaminophen 5-325 MG tablet Commonly known as: NORCO/VICODIN Take 1 tablet by mouth every 6 (six) hours as needed for severe pain (post-operatively).   ibuprofen 200 MG tablet Commonly known as: ADVIL Take 200 mg by mouth every 6 (six) hours as needed for mild pain.   Januvia 50 MG tablet Generic drug: sitaGLIPtin TAKE 1 TABLET(50 MG) BY MOUTH DAILY   Lite Touch Lancing Device Misc   lovastatin 40 MG tablet Commonly known as: MEVACOR TAKE 1 TABLET(40 MG) BY MOUTH AT BEDTIME   pioglitazone-metformin 15-850 MG tablet Commonly known as: ACTOPLUS MET TAKE 1 TABLET BY MOUTH TWICE DAILY WITH A MEAL        Follow-up Information     Alexis Frock, MD Follow up on 03/16/2022.   Specialty: Urology Why: at 10 AM for MD visit and catheter  removal. Contact information: [place] [address] [phone]                 Signed: Alexis Frock 03/12/2022, 12:35 PM
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
## 2022-03-12 NOTE — Progress Notes (Signed)
  Transition of Care [facility]) Screening Note   Patient Details  Name: Joe Barry Date of Birth: 1934-07-22   Transition of [facility]) CM/SW Contact:    Lanier Clam, RN Phone Number: 03/12/2022, 11:58 AM    Transition of Care Department [facility]) has reviewed patient and no TOC needs have been identified at this time. We will continue to monitor patient advancement through interdisciplinary progression rounds. If new patient transition needs arise, please place a TOC consult.

## 2022-03-12 NOTE — Progress Notes (Signed)
Patient and his daughter given discharge, medication, follow up, and foley catheter care instructions, verbalized understanding, IV removed, personal belongings with patient, family to transport home

## 2022-05-14 ENCOUNTER — Other Ambulatory Visit: Payer: Self-pay | Admitting: Family Medicine

## 2022-05-14 DIAGNOSIS — E118 Type 2 diabetes mellitus with unspecified complications: Secondary | ICD-10-CM

## 2022-05-20 ENCOUNTER — Encounter: Payer: Self-pay | Admitting: Internal Medicine

## 2022-05-21 ENCOUNTER — Other Ambulatory Visit: Payer: Self-pay | Admitting: Family Medicine

## 2022-05-21 DIAGNOSIS — E1169 Type 2 diabetes mellitus with other specified complication: Secondary | ICD-10-CM

## 2022-05-21 DIAGNOSIS — I152 Hypertension secondary to endocrine disorders: Secondary | ICD-10-CM

## 2022-05-21 DIAGNOSIS — E118 Type 2 diabetes mellitus with unspecified complications: Secondary | ICD-10-CM

## 2022-06-23 ENCOUNTER — Encounter: Payer: Self-pay | Admitting: Internal Medicine

## 2022-07-06 ENCOUNTER — Encounter: Payer: Self-pay | Admitting: Internal Medicine

## 2022-07-07 DIAGNOSIS — R338 Other retention of urine: Secondary | ICD-10-CM | POA: Diagnosis not present

## 2022-07-13 ENCOUNTER — Ambulatory Visit: Payer: Medicare PPO | Admitting: Family Medicine

## 2022-07-13 ENCOUNTER — Other Ambulatory Visit: Payer: Self-pay

## 2022-07-13 ENCOUNTER — Encounter: Payer: Self-pay | Admitting: Family Medicine

## 2022-07-13 VITALS — BP 124/80 | HR 78 | Temp 98.5°F

## 2022-07-13 DIAGNOSIS — Z23 Encounter for immunization: Secondary | ICD-10-CM

## 2022-07-13 DIAGNOSIS — R42 Dizziness and giddiness: Secondary | ICD-10-CM

## 2022-07-13 MED ORDER — BLOOD GLUCOSE MONITOR KIT: PACK | 0 refills | Status: AC

## 2022-07-13 NOTE — Progress Notes (Signed)
   Subjective:    Patient ID: Joe Barry, male    DOB: 28-Nov-1933, 88 y.o.   MRN: [id number]  HPI He states that he is having difficulty with intermittent dizziness that he has had since he has had his prostate surgery.  There is no associated blurred vision, double vision, nausea, vomiting, fever, chills, coughing.  He has not noted any heart rate changes, food has no effect.  Position of head has no effect.  Apparently his blood sugars and blood pressure has been good.  His daughter does check this.   Review of Systems     Objective:   Physical Exam Alert and in no distress. Tympanic membranes and canals are normal. Pharyngeal area is normal. Neck is supple without adenopathy or thyromegaly. Cardiac exam shows a regular sinus rhythm without murmurs or gallops. Lungs are clear to auscultation.        Assessment & Plan:  Dizziness - Plan: CBC with Differential/Platelet, Comprehensive metabolic panel  Need for influenza vaccination - Plan: CANCELED: Flu vaccine HIGH DOSE PF (Fluzone High dose)  Need for COVID-19 vaccine - Plan: Pfizer Fall 2023 Covid-19 Vaccine 68yrs and older Etiology of his dizziness is unclear.  I will do some basic blood work and go from there.

## 2022-07-14 LAB — COMPREHENSIVE METABOLIC PANEL
ALT: 9 IU/L (ref 0–44)
AST: 13 IU/L (ref 0–40)
Albumin/Globulin Ratio: 1.4 (ref 1.2–2.2)
Albumin: 4.2 g/dL (ref 3.7–4.7)
Alkaline Phosphatase: 88 IU/L (ref 44–121)
BUN/Creatinine Ratio: 24 (ref 10–24)
BUN: 24 mg/dL (ref 8–27)
Bilirubin Total: 0.5 mg/dL (ref 0.0–1.2)
CO2: 23 mmol/L (ref 20–29)
Calcium: 9.5 mg/dL (ref 8.6–10.2)
Chloride: 99 mmol/L (ref 96–106)
Creatinine, Ser: 1.01 mg/dL (ref 0.76–1.27)
Globulin, Total: 2.9 g/dL (ref 1.5–4.5)
Glucose: 134 mg/dL — ABNORMAL HIGH (ref 70–99)
Potassium: 4.8 mmol/L (ref 3.5–5.2)
Sodium: 137 mmol/L (ref 134–144)
Total Protein: 7.1 g/dL (ref 6.0–8.5)
eGFR: 72 mL/min/{1.73_m2} (ref >59)

## 2022-07-14 LAB — CBC WITH DIFFERENTIAL/PLATELET
Basophils Absolute: 0 10*3/uL (ref 0.0–0.2)
Basos: 1 %
EOS (ABSOLUTE): 0.1 10*3/uL (ref 0.0–0.4)
Eos: 2 %
Hematocrit: 35.4 % — ABNORMAL LOW (ref 37.5–51.0)
Hemoglobin: 11.7 g/dL — ABNORMAL LOW (ref 13.0–17.7)
Immature Grans (Abs): 0 10*3/uL (ref 0.0–0.1)
Immature Granulocytes: 0 %
Lymphocytes Absolute: 1.3 10*3/uL (ref 0.7–3.1)
Lymphs: 28 %
MCH: 30.4 pg (ref 26.6–33.0)
MCHC: 33.1 g/dL (ref 31.5–35.7)
MCV: 92 fL (ref 79–97)
Monocytes Absolute: 0.8 10*3/uL (ref 0.1–0.9)
Monocytes: 17 %
Neutrophils Absolute: 2.3 10*3/uL (ref 1.4–7.0)
Neutrophils: 52 %
Platelets: 233 10*3/uL (ref 150–450)
RBC: 3.85 x10E6/uL — ABNORMAL LOW (ref 4.14–5.80)
RDW: 14.5 % (ref 11.6–15.4)
WBC: 4.6 10*3/uL (ref 3.4–10.8)

## 2022-07-31 ENCOUNTER — Ambulatory Visit: Payer: Medicare PPO

## 2022-08-14 ENCOUNTER — Ambulatory Visit: Payer: Medicare PPO

## 2022-08-19 ENCOUNTER — Other Ambulatory Visit: Payer: Self-pay | Admitting: Family Medicine

## 2022-08-19 DIAGNOSIS — E1159 Type 2 diabetes mellitus with other circulatory complications: Secondary | ICD-10-CM

## 2022-08-19 DIAGNOSIS — E118 Type 2 diabetes mellitus with unspecified complications: Secondary | ICD-10-CM

## 2022-08-19 DIAGNOSIS — E1169 Type 2 diabetes mellitus with other specified complication: Secondary | ICD-10-CM

## 2022-08-19 NOTE — Telephone Encounter (Signed)
Called patient's daughter Ciaran Begay and advised her that patient is due for DM follow up. She agreed to call back tomorrow to schedule appointment after speaking with patient.

## 2022-09-11 ENCOUNTER — Ambulatory Visit (INDEPENDENT_AMBULATORY_CARE_PROVIDER_SITE_OTHER): Payer: Medicare PPO

## 2022-09-11 VITALS — Ht 64.0 in | Wt 170.0 lb

## 2022-09-11 DIAGNOSIS — Z0000 Encounter for general adult medical examination without abnormal findings: Secondary | ICD-10-CM

## 2022-09-11 NOTE — Patient Instructions (Signed)
Mr. Joe Barry , Thank you for taking time to come for your Medicare Wellness Visit. I appreciate your ongoing commitment to your health goals. Please review the following plan we discussed and let me know if I can assist you in the future.   These are the goals we discussed:  Goals      Patient Stated     07/25/2021, no goals     Patient Stated     09/11/2022, no goals        This is a list of the screening recommended for you and due dates:  Health Maintenance  Topic Date Due   Zoster (Shingles) Vaccine (1 of 2) Never done   Eye exam for diabetics  07/29/2016   Complete foot exam   07/09/2021   DTaP/Tdap/Td vaccine (2 - Td or Tdap) 01/20/2022   Hemoglobin A1C  08/27/2022   Medicare Annual Wellness Visit  09/12/2023   Pneumonia Vaccine  Completed   Flu Shot  Completed   COVID-19 Vaccine  Completed   HPV Vaccine  Aged Out    Advanced directives: Please bring a copy of your POA (Power of [place]) and/or Living Will to your next appointment.   Conditions/risks identified: none  Next appointment: Follow up in one year for your annual wellness visit.   Preventive Care 86 Years and Older, Male  Preventive care refers to lifestyle choices and visits with your health care provider that can promote health and wellness. What does preventive care include? A yearly physical exam. This is also called an annual well check. Dental exams once or twice a year. Routine eye exams. Ask your health care provider how often you should have your eyes checked. Personal lifestyle choices, including: Daily care of your teeth and gums. Regular physical activity. Eating a healthy diet. Avoiding tobacco and drug use. Limiting alcohol use. Practicing safe sex. Taking low doses of aspirin every day. Taking vitamin and mineral supplements as recommended by your health care provider. What happens during an annual well check? The services and screenings done by your health care provider during your  annual well check will depend on your age, overall health, lifestyle risk factors, and family history of disease. Counseling  Your health care provider may ask you questions about your: Alcohol use. Tobacco use. Drug use. Emotional well-being. Home and relationship well-being. Sexual activity. Eating habits. History of falls. Memory and ability to understand (cognition). Work and work Astronomer. Screening  You may have the following tests or measurements: Height, weight, and BMI. Blood pressure. Lipid and cholesterol levels. These may be checked every 5 years, or more frequently if you are over 54 years old. Skin check. Lung cancer screening. You may have this screening every year starting at age 29 if you have a 30-pack-year history of smoking and currently smoke or have quit within the past 15 years. Fecal occult blood test (FOBT) of the stool. You may have this test every year starting at age 41. Flexible sigmoidoscopy or colonoscopy. You may have a sigmoidoscopy every 5 years or a colonoscopy every 10 years starting at age 61. Prostate cancer screening. Recommendations will vary depending on your family history and other risks. Hepatitis C blood test. Hepatitis B blood test. Sexually transmitted disease (STD) testing. Diabetes screening. This is done by checking your blood sugar (glucose) after you have not eaten for a while (fasting). You may have this done every 1-3 years. Abdominal aortic aneurysm (AAA) screening. You may need this if you are a current  or former smoker. Osteoporosis. You may be screened starting at age 72 if you are at high risk. Talk with your health care provider about your test results, treatment options, and if necessary, the need for more tests. Vaccines  Your health care provider may recommend certain vaccines, such as: Influenza vaccine. This is recommended every year. Tetanus, diphtheria, and acellular pertussis (Tdap, Td) vaccine. You may need a Td  booster every 10 years. Zoster vaccine. You may need this after age 30. Pneumococcal 13-valent conjugate (PCV13) vaccine. One dose is recommended after age 7. Pneumococcal polysaccharide (PPSV23) vaccine. One dose is recommended after age 29. Talk to your health care provider about which screenings and vaccines you need and how often you need them. This information is not intended to replace advice given to you by your health care provider. Make sure you discuss any questions you have with your health care provider. Document Released: 09/27/2015 Document Revised: 05/20/2016 Document Reviewed: 07/02/2015 Elsevier Interactive Patient Education  2017 [employer].  Fall Prevention in the Home Falls can cause injuries. They can happen to people of all ages. There are many things you can do to make your home safe and to help prevent falls. What can I do on the outside of my home? Regularly fix the edges of walkways and driveways and fix any cracks. Remove anything that might make you trip as you walk through a door, such as a raised step or threshold. Trim any bushes or trees on the path to your home. Use bright outdoor lighting. Clear any walking paths of anything that might make someone trip, such as rocks or tools. Regularly check to see if handrails are loose or broken. Make sure that both sides of any steps have handrails. Any raised decks and porches should have guardrails on the edges. Have any leaves, snow, or ice cleared regularly. Use sand or salt on walking paths during winter. Clean up any spills in your garage right away. This includes oil or grease spills. What can I do in the bathroom? Use night lights. Install grab bars by the toilet and in the tub and shower. Do not use towel bars as grab bars. Use non-skid mats or decals in the tub or shower. If you need to sit down in the shower, use a plastic, non-slip stool. Keep the floor dry. Clean up any water that spills on the floor  as soon as it happens. Remove soap buildup in the tub or shower regularly. Attach bath mats securely with double-sided non-slip rug tape. Do not have throw rugs and other things on the floor that can make you trip. What can I do in the bedroom? Use night lights. Make sure that you have a light by your bed that is easy to reach. Do not use any sheets or blankets that are too big for your bed. They should not hang down onto the floor. Have a firm chair that has side arms. You can use this for support while you get dressed. Do not have throw rugs and other things on the floor that can make you trip. What can I do in the kitchen? Clean up any spills right away. Avoid walking on wet floors. Keep items that you use a lot in easy-to-reach places. If you need to reach something above you, use a strong step stool that has a grab bar. Keep electrical cords out of the way. Do not use floor polish or wax that makes floors slippery. If you must use wax,  use non-skid floor wax. Do not have throw rugs and other things on the floor that can make you trip. What can I do with my stairs? Do not leave any items on the stairs. Make sure that there are handrails on both sides of the stairs and use them. Fix handrails that are broken or loose. Make sure that handrails are as long as the stairways. Check any carpeting to make sure that it is firmly attached to the stairs. Fix any carpet that is loose or worn. Avoid having throw rugs at the top or bottom of the stairs. If you do have throw rugs, attach them to the floor with carpet tape. Make sure that you have a light switch at the top of the stairs and the bottom of the stairs. If you do not have them, ask someone to add them for you. What else can I do to help prevent falls? Wear shoes that: Do not have high heels. Have rubber bottoms. Are comfortable and fit you well. Are closed at the toe. Do not wear sandals. If you use a stepladder: Make sure that it is  fully opened. Do not climb a closed stepladder. Make sure that both sides of the stepladder are locked into place. Ask someone to hold it for you, if possible. Clearly mark and make sure that you can see: Any grab bars or handrails. First and last steps. Where the edge of each step is. Use tools that help you move around (mobility aids) if they are needed. These include: Canes. Walkers. Scooters. Crutches. Turn on the lights when you go into a dark area. Replace any light bulbs as soon as they burn out. Set up your furniture so you have a clear path. Avoid moving your furniture around. If any of your floors are uneven, fix them. If there are any pets around you, be aware of where they are. Review your medicines with your doctor. Some medicines can make you feel dizzy. This can increase your chance of falling. Ask your doctor what other things that you can do to help prevent falls. This information is not intended to replace advice given to you by your health care provider. Make sure you discuss any questions you have with your health care provider. Document Released: 06/27/2009 Document Revised: 02/06/2016 Document Reviewed: 10/05/2014 Elsevier Interactive Patient Education  2017 [employer].

## 2022-09-11 NOTE — Progress Notes (Signed)
I connected with Joe Barry today by telephone and verified that I am speaking with the correct person using two identifiers. Location patient: home Location provider: work Persons participating in the virtual visit: Joe Barry, Danae Chen (daughter), Glenna Durand LPN   I discussed the limitations, risks, security and privacy concerns of performing an evaluation and management service by telephone and the availability of in person appointments. I also discussed with the patient that there may be a patient responsible charge related to this service. The patient expressed understanding and verbally consented to this telephonic visit.    Interactive audio and video telecommunications were attempted between this provider and patient, however failed, due to patient having technical difficulties OR patient did not have access to video capability.  We continued and completed visit with audio only.     Vital signs may be patient reported or missing.  Subjective:   Joe Barry is a 88 y.o. male who presents for Medicare Annual/Subsequent preventive examination.  Review of Systems     Cardiac Risk Factors include: advanced age (>25mn, >>35women);diabetes mellitus;dyslipidemia;hypertension;male gender     Objective:    Today's Vitals   09/11/22 0842  Weight: 170 lb (77.1 kg)  Height: _0  (1.626 m)  PainSc: 10-Worst pain ever   Body mass index is 29.18 kg/m.     09/11/2022    8:47 AM 03/12/2022    3:00 AM 02/25/2022   11:11 AM 09/03/2021   11:48 PM 07/25/2021   12:06 PM 07/09/2020    2:00 PM 12/12/2019   12:21 PM  Advanced Directives  Does Patient Have a Medical Advance Directive? Yes No No No Yes Yes Yes  Type of [place] will    [place] will Living will [place] will  Does patient want to make changes to medical advance directive?      No - Patient declined No - Patient declined   Copy of [place] Chart? No - copy requested    No - copy requested    Would patient like information on creating a medical advance directive?  No - Patient declined  No - Patient declined       Current Medications (verified) Outpatient Encounter Medications as of 09/11/2022  Medication Sig   Accu-Chek FastClix Lancets MISC USE AS DIRECTED   acetaminophen (TYLENOL) 500 MG tablet Take 500 mg by mouth every 6 (six) hours as needed for moderate pain.   amLODipine (NORVASC) 10 MG tablet TAKE 1 TABLET(10 MG) BY MOUTH DAILY   blood glucose meter kit and supplies KIT Dispense based on patient and insurance preference. Use up to four times daily as directed.   Blood Glucose Monitoring Suppl (FORA V12 BLOOD GLUCOSE SYSTEM) DEVI Testing once a day   finasteride (PROSCAR) 5 MG tablet Take 5 mg by mouth daily.   glucose blood (ACCU-CHEK GUIDE) test strip CHECK BLOOD GLUCOSE TWICE DAILY   hydrochlorothiazide (MICROZIDE) 12.5 MG capsule TAKE 1 CAPSULE(12.5 MG) BY MOUTH DAILY   ibuprofen (ADVIL) 200 MG tablet Take 200 mg by mouth every 6 (six) hours as needed for mild pain.   JANUVIA 50 MG tablet TAKE 1 TABLET(50 MG) BY MOUTH DAILY   Lancet Devices (LITE TOUCH LANCING DEVICE) MISC    lovastatin (MEVACOR) 40 MG tablet TAKE 1 TABLET(40 MG) BY MOUTH AT BEDTIME   MYRBETRIQ 50 MG TB24 tablet Take 50 mg by mouth daily.   pioglitazone-metformin (ACTOPLUS MET) 15-850 MG  tablet TAKE 1 TABLET BY MOUTH TWICE DAILY WITH A MEAL   HYDROcodone-acetaminophen (NORCO/VICODIN) 5-325 MG tablet Take 1 tablet by mouth every 6 (six) hours as needed for severe pain (post-operatively). (Patient not taking: Reported on 07/13/2022)   No facility-administered encounter medications on file as of 09/11/2022.    Allergies (verified) Other   History: Past Medical History:  Diagnosis Date   AAA (abdominal aortic aneurysm) ([place])    Arthritis    BPH (benign prostatic hypertrophy)    Cataract    Diabetes  mellitus    Dyslipidemia    Hypertension    Dr. Matthew Folks,    Neuromuscular disorder [facility])    carpal tunnel in right wrist   No blood products    Refusal of blood transfusions as patient is Jehovah's Witness    Past Surgical History:  Procedure Laterality Date   CARPAL TUNNEL RELEASE     CHOLECYSTECTOMY     COLONOSCOPY  2008   EYE SURGERY     bilat cataract    FALSE ANEURYSM REPAIR  07/16/2012   Procedure: REPAIR FALSE ANEURYSM;  Surgeon: Rosetta Posner, MD;  Location: [facility] OR;  Service: Vascular;  Laterality: Bilateral;  false femoral aneurysm repair with femoral artery repair   GAS/FLUID EXCHANGE  01/28/2012   Procedure: GAS/FLUID EXCHANGE;  Surgeon: Hayden Pedro, MD;  Location: [place];  Service: Ophthalmology;  Laterality: Right;   I & D EXTREMITY  09/23/2012   Procedure: IRRIGATION AND DEBRIDEMENT EXTREMITY;  Surgeon: Rosetta Posner, MD;  Location: [place];  Service: Vascular;  Laterality: Right;  Irrigation and Debridement prepubic and right groin wound   PARS PLANA VITRECTOMY W/ REPAIR OF MACULAR HOLE  01/2012   PARS PLANA VITRECTOMY W/ SCLERAL BUCKLE  01/28/2012   Procedure: PARS PLANA VITRECTOMY WITH LASER FOR MACULAR HOLE;  Surgeon: Hayden Pedro, MD;  Location: [place];  Service: Ophthalmology;  Laterality: Right;  25 gauge ppv right eye    SIGMOIDOSCOPY     SKIN BIOPSY Left 10/21/2020   pyogenic granuloma ulcerated    TOTAL KNEE ARTHROPLASTY  2009   Left   TRANSURETHRAL RESECTION OF PROSTATE N/A 03/11/2022   Procedure: TRANSURETHRAL RESECTION OF THE PROSTATE (TURP);  Surgeon: Alexis Frock, MD;  Location: [facility];  Service: Urology;  Laterality: N/A;  75 MINS   Family History  Problem Relation Age of Onset   Cancer Mother        ovarian   Diabetes Father    Social History   Socioeconomic History   Marital status: Widowed    Spouse name: Not on file   Number of children: Not on file   Years of education: Not on file   Highest education level: Not on file  Occupational  History   Not on file  Tobacco Use   Smoking status: Former    Types: Cigarettes    Quit date: 07/26/1972    Years since quitting: 50.1   Smokeless tobacco: Never  Vaping Use   Vaping Use: Never used  Substance and Sexual Activity   Alcohol use: Yes    Alcohol/week: 3.0 standard drinks of alcohol    Types: 3 Shots of liquor per week    Comment: every day or so   Drug use: No   Sexual activity: Not Currently  Other Topics Concern   Not on file  Social History Narrative   Not on file   Social Determinants of Health   Financial Resource Strain: Low Risk  (09/11/2022)  Overall Financial Resource Strain (CARDIA)    Difficulty of Paying Living Expenses: Not hard at all  Food Insecurity: No Food Insecurity (09/11/2022)   Hunger Vital Sign    Worried About Running Out of Food in the Last Year: Never true    Ran Out of Food in the Last Year: Never true  Transportation Needs: No Transportation Needs (09/11/2022)   PRAPARE - Hydrologist (Medical): No    Lack of Transportation (Non-Medical): No  Physical Activity: Inactive (09/11/2022)   Exercise Vital Sign    Days of Exercise per Week: 0 days    Minutes of Exercise per Session: 0 min  Stress: No Stress Concern Present (09/11/2022)   [place]    Feeling of Stress : Not at all  Social Connections: Not on file    Tobacco Counseling Counseling given: Not Answered   Clinical Intake:  Pre-visit preparation completed: Yes  Pain : 0-10 Pain Score: 10-Worst pain ever Pain Type: Chronic pain Pain Location: Hand (knees) Pain Orientation: Left, Right Pain Descriptors / Indicators: Aching Pain Onset: More than a month ago Pain Frequency: Intermittent     Nutritional Status: BMI 25 -29 Overweight Nutritional Risks: None Diabetes: Yes  How often do you need to have someone help you when you read instructions, pamphlets, or other  written materials from your doctor or pharmacy?: 3 - Sometimes  Diabetic?yes Nutrition Risk Assessment:  Has the patient had any N/V/D within the last 2 months?  No  Does the patient have any non-healing wounds?  No  Has the patient had any unintentional weight loss or weight gain?  No   Diabetes:  Is the patient diabetic?  Yes  If diabetic, was a CBG obtained today?  No  Did the patient bring in their glucometer from home?  No  How often do you monitor your CBG's? Every other day.   Financial Strains and Diabetes Management:  Are you having any financial strains with the device, your supplies or your medication? No .  Does the patient want to be seen by Chronic Care Management for management of their diabetes?  No  Would the patient like to be referred to a Nutritionist or for Diabetic Management?  No   Diabetic Exams:  Diabetic Eye Exam: Overdue for diabetic eye exam. Pt has been advised about the importance in completing this exam. Patient advised to call and schedule an eye exam. Diabetic Foot Exam: Overdue, Pt has been advised about the importance in completing this exam. Pt is scheduled for diabetic foot exam on next appointment.   Interpreter Needed?: No  Information entered by :: NAllen LPN   Activities of Daily Living    09/11/2022    8:50 AM 03/11/2022    4:00 PM  In your present state of health, do you have any difficulty performing the following activities:  Hearing? 0 0  Vision? 0 0  Difficulty concentrating or making decisions? 1 0  Walking or climbing stairs? 1 1  Dressing or bathing? 0 0  Doing errands, shopping? 1 0  Preparing Food and eating ? N   Using the Toilet? N   In the past six months, have you accidently leaked urine? Y   Do you have problems with loss of bowel control? N   Managing your Medications? Y   Managing your Finances? N   Housekeeping or managing your Housekeeping? N     Patient Care Team:  Denita Lung, MD as PCP - General  (Family Medicine)  Indicate any recent Medical Services you may have received from other than [facility] providers in the past year (date may be approximate).     Assessment:   This is a routine wellness examination for Montie.  Hearing/Vision screen Vision Screening - Comments:: No regular eye exams,  Dietary issues and exercise activities discussed: Current Exercise Habits: The patient does not participate in regular exercise at present   Goals Addressed             This Visit's Progress    Patient Stated       09/11/2022, no goals       Depression Screen    09/11/2022    8:49 AM 07/25/2021   12:08 PM 07/15/2021    9:22 AM 07/09/2020    2:03 PM 07/03/2019    2:03 PM 04/18/2018    9:23 AM 04/03/2016    8:45 AM  PHQ 2/9 Scores  PHQ - 2 Score 0 0 0 0 0 0 0  PHQ- 9 Score 1          Fall Risk    09/11/2022    8:48 AM 07/25/2021   12:07 PM 01/28/2021   11:44 AM 07/09/2020    2:03 PM 07/03/2019    2:03 PM  Fall Risk   Falls in the past year? _0 Comment slipped out of chair lost balance     Number falls in past yr: 1 0 0 0 1  Injury with Fall? 0 0 1 0 0  Risk for fall due to : Impaired balance/gait;Impaired mobility;Medication side effect Impaired balance/gait;Impaired mobility;Medication side effect Impaired mobility    Follow up Falls evaluation completed;Education provided;Falls prevention discussed Falls evaluation completed;Education provided;Falls prevention discussed Falls evaluation completed      FALL RISK PREVENTION PERTAINING TO THE HOME:  Any stairs in or around the home? No  If so, are there any without handrails? N/a Home free of loose throw rugs in walkways, pet beds, electrical cords, etc? Yes  Adequate lighting in your home to reduce risk of falls? Yes   ASSISTIVE DEVICES UTILIZED TO PREVENT FALLS:  Life alert? No  Use of a cane, walker or w/c? Yes  Grab bars in the bathroom? Yes  Shower chair or bench in shower? Yes  Elevated toilet seat  or a handicapped toilet? Yes   TIMED UP AND GO:  Was the test performed? No .      Cognitive Function:        09/11/2022    8:51 AM 07/25/2021   12:10 PM  6CIT Screen  What Year? 4 points 0 points  What month? 0 points 0 points  What time? 0 points 3 points  Count back from 20 4 points 4 points  Months in reverse 4 points 4 points  Repeat phrase 10 points 10 points  Total Score 22 points 21 points    Immunizations Immunization History  Administered Date(s) Administered   COVID-19, mRNA, vaccine(Comirnaty)12 years and older 07/13/2022   Fluad Quad(high Dose 65+) 07/03/2019, 07/09/2020, 07/13/2022   Influenza Split 06/20/2009, 08/24/2011, 07/17/2012   Influenza, High Dose Seasonal PF 07/05/2013, 06/12/2014, 05/14/2015, 10/27/2016, 06/17/2017   PFIZER(Purple Top)SARS-COV-2 Vaccination 11/09/2019, 12/05/2019, 07/09/2020   Pneumococcal Conjugate-13 11/25/2015   Pneumococcal Polysaccharide-23 01/21/2012   Tdap 01/21/2012    TDAP status: Due, Education has been provided regarding the importance of this vaccine. Advised may receive this vaccine at local  pharmacy or Health Dept. Aware to provide a copy of the vaccination record if obtained from local pharmacy or Health Dept. Verbalized acceptance and understanding.  Flu Vaccine status: Up to date  Pneumococcal vaccine status: Up to date  Covid-19 vaccine status: Completed vaccines  Qualifies for Shingles Vaccine? Yes   Zostavax completed No   Shingrix Completed?: No.    Education has been provided regarding the importance of this vaccine. Patient has been advised to call insurance company to determine out of pocket expense if they have not yet received this vaccine. Advised may also receive vaccine at local pharmacy or Health Dept. Verbalized acceptance and understanding.  Screening Tests Health Maintenance  Topic Date Due   Zoster Vaccines- Shingrix (1 of 2) Never done   OPHTHALMOLOGY EXAM  07/29/2016   FOOT EXAM   07/09/2021   DTaP/Tdap/Td (2 - Td or Tdap) 01/20/2022   Medicare Annual Wellness (AWV)  07/25/2022   HEMOGLOBIN A1C  08/27/2022   Pneumonia Vaccine 43+ Years old  Completed   INFLUENZA VACCINE  Completed   COVID-19 Vaccine  Completed   HPV VACCINES  Aged Out    Health Maintenance  Health Maintenance Due  Topic Date Due   Zoster Vaccines- Shingrix (1 of 2) Never done   OPHTHALMOLOGY EXAM  07/29/2016   FOOT EXAM  07/09/2021   DTaP/Tdap/Td (2 - Td or Tdap) 01/20/2022   Medicare Annual Wellness (AWV)  07/25/2022   HEMOGLOBIN A1C  08/27/2022    Colorectal cancer screening: No longer required.   Lung Cancer Screening: (Low Dose CT Chest recommended if Age 84-80 years, 30 pack-year currently smoking OR have quit w/in 15years.) does not qualify.   Lung Cancer Screening Referral: no  Additional Screening:  Hepatitis C Screening: does not qualify;   Vision Screening: Recommended annual ophthalmology exams for early detection of glaucoma and other disorders of the eye. Is the patient up to date with their annual eye exam?  No  Who is the provider or what is the name of the office in which the patient attends annual eye exams? none If pt is not established with a provider, would they like to be referred to a provider to establish care? No .   Dental Screening: Recommended annual dental exams for proper oral hygiene  Community Resource Referral / Chronic Care Management: CRR required this visit?  No   CCM required this visit?  No      Plan:     I have personally reviewed and noted the following in the patient's chart:   Medical and social history Use of alcohol, tobacco or illicit drugs  Current medications and supplements including opioid prescriptions. Patient is not currently taking opioid prescriptions. Functional ability and status Nutritional status Physical activity Advanced directives List of other physicians Hospitalizations, surgeries, and ER visits in previous  12 months Vitals Screenings to include cognitive, depression, and falls Referrals and appointments  In addition, I have reviewed and discussed with patient certain preventive protocols, quality metrics, and best practice recommendations. A written personalized care plan for preventive services as well as general preventive health recommendations were provided to patient.     Kellie Simmering, LPN   38/06/1750   Nurse Notes: none  Due to this being a virtual visit, the after visit summary with patients personalized plan was offered to patient via mail or my-chart.  Patient would like to access on my-chart

## 2022-09-17 ENCOUNTER — Telehealth: Payer: Medicare PPO | Admitting: Family Medicine

## 2022-09-17 ENCOUNTER — Encounter: Payer: Self-pay | Admitting: Family Medicine

## 2022-09-17 DIAGNOSIS — J209 Acute bronchitis, unspecified: Secondary | ICD-10-CM

## 2022-09-17 MED ORDER — AZITHROMYCIN 500 MG PO TABS: 500.0000 mg | ORAL_TABLET | Freq: Every day | ORAL | 0 refills | Status: DC

## 2022-09-17 NOTE — Progress Notes (Signed)
   Subjective:    Patient ID: Joe Barry, male    DOB: 10/31/1933, 89 y.o.   MRN: [id number]  HPI Documentation for virtual audio and video telecommunications through [place] encounter: The patient was located at home. 2 patient identifiers used.  The provider was located in the office. The patient did consent to this visit and is aware of possible charges through their insurance for this visit. The other persons participating in this telemedicine service was with his daughter time spent on call was 5 minutes and in review of previous records >15  minutes total for counseling and coordination of care. This virtual service is not related to other E/M service within previous 7 days.  He apparently has had difficulty over the last several weeks with nasal congestion, rhinorrhea and coughing.  The coughing is quite loose and intermittently productive.  No fever.  No sore throat or earache.  Review of Systems     Objective:   Physical Exam Alert and in no distress.  He was observed coughing that does sound quite wet.       Assessment & Plan:  Acute bronchitis, unspecified organism - Plan: azithromycin (ZITHROMAX) 500 MG tablet I think is reasonable to go ahead and treat it since it has been going on for 2 weeks.  Explained that the medicine lasts at least a week and to give it that long before we make a call as to whether it is doing any good or not.  He and his daughters understand this.

## 2022-11-02 ENCOUNTER — Other Ambulatory Visit: Payer: Self-pay | Admitting: Family Medicine

## 2022-11-02 DIAGNOSIS — E1159 Type 2 diabetes mellitus with other circulatory complications: Secondary | ICD-10-CM

## 2022-12-30 ENCOUNTER — Other Ambulatory Visit: Payer: Self-pay | Admitting: Family Medicine

## 2022-12-30 DIAGNOSIS — I152 Hypertension secondary to endocrine disorders: Secondary | ICD-10-CM

## 2022-12-30 DIAGNOSIS — E1169 Type 2 diabetes mellitus with other specified complication: Secondary | ICD-10-CM

## 2022-12-30 NOTE — Telephone Encounter (Signed)
Patient is due for follow up. I called patient's daughter and scheduled appointment for 01/06/2023. Courtesy refill sent to pharmacy.

## 2023-01-06 ENCOUNTER — Ambulatory Visit: Payer: Medicare PPO | Admitting: Family Medicine

## 2023-01-12 ENCOUNTER — Ambulatory Visit: Payer: Medicare PPO | Admitting: Family Medicine

## 2023-01-12 DIAGNOSIS — E1169 Type 2 diabetes mellitus with other specified complication: Secondary | ICD-10-CM

## 2023-01-12 DIAGNOSIS — E669 Obesity, unspecified: Secondary | ICD-10-CM

## 2023-01-12 DIAGNOSIS — I152 Hypertension secondary to endocrine disorders: Secondary | ICD-10-CM

## 2023-01-12 DIAGNOSIS — I739 Peripheral vascular disease, unspecified: Secondary | ICD-10-CM

## 2023-01-12 DIAGNOSIS — E118 Type 2 diabetes mellitus with unspecified complications: Secondary | ICD-10-CM

## 2023-01-14 ENCOUNTER — Ambulatory Visit: Payer: Medicare PPO | Admitting: Family Medicine

## 2023-01-14 ENCOUNTER — Encounter: Payer: Self-pay | Admitting: Family Medicine

## 2023-01-14 VITALS — BP 152/74 | HR 76 | Temp 97.6°F | Resp 16 | Wt 179.0 lb

## 2023-01-14 DIAGNOSIS — E1159 Type 2 diabetes mellitus with other circulatory complications: Secondary | ICD-10-CM | POA: Diagnosis not present

## 2023-01-14 DIAGNOSIS — E118 Type 2 diabetes mellitus with unspecified complications: Secondary | ICD-10-CM | POA: Diagnosis not present

## 2023-01-14 DIAGNOSIS — E1169 Type 2 diabetes mellitus with other specified complication: Secondary | ICD-10-CM | POA: Diagnosis not present

## 2023-01-14 DIAGNOSIS — I152 Hypertension secondary to endocrine disorders: Secondary | ICD-10-CM | POA: Diagnosis not present

## 2023-01-14 DIAGNOSIS — B351 Tinea unguium: Secondary | ICD-10-CM

## 2023-01-14 DIAGNOSIS — I890 Lymphedema, not elsewhere classified: Secondary | ICD-10-CM | POA: Diagnosis not present

## 2023-01-14 DIAGNOSIS — E669 Obesity, unspecified: Secondary | ICD-10-CM

## 2023-01-14 DIAGNOSIS — E785 Hyperlipidemia, unspecified: Secondary | ICD-10-CM

## 2023-01-14 DIAGNOSIS — E11618 Type 2 diabetes mellitus with other diabetic arthropathy: Secondary | ICD-10-CM

## 2023-01-14 LAB — POCT GLYCOSYLATED HEMOGLOBIN (HGB A1C): Hemoglobin A1C: 6.9 % — AB (ref 4.0–5.6)

## 2023-01-14 NOTE — Progress Notes (Signed)
  Subjective:    Patient ID: Joe Barry, male    DOB: Jun 13, 1934, 89 y.o.   MRN: [id number]  Joe Barry is a 89 y.o. male who presents for follow-up of Type 2 diabetes mellitus.  Home blood sugar records:  blood sugars are not checked Current symptoms/problems include polyuria and numbness in fingers  and have been stable. Daily foot checks: yes   Any foot concerns: no How often blood sugars checked: rarely Exercise: The patient does not participate in regular exercise at present. Diet: regular He continues on amlodipine and HCTZ.  He is doing okay on that.  He is also taking finasteride and Myrbetriq to help with his bladder symptoms.  He continues on Januvia and Actos plus.  He has tried Jardiance in the past but it was causing difficulty.  He continues on the core without a problem.  Continues have difficulty with lymphedema.He does have onychomycosis and will eventually need to see podiatry.  He does complain of difficulty with arthritis especially in the knees.  He has taken intermittent doses of Tylenol and NSAID. The following portions of the patient's history were reviewed and updated as appropriate: allergies, current medications, past medical history, past social history and problem list.  ROS as in subjective above.     Objective:    Physical Exam Alert and in no distress lower extremities do show extensive edema. Hemoglobin A1c is 6.9. Blood pressure (!) 152/74, pulse 76, temperature 97.6 F (36.4 C), temperature source Oral, resp. rate 16, weight 179 lb (81.2 kg), SpO2 97 %.  Lab Review    Latest Ref Rng & Units 01/14/2023   12:05 PM 07/13/2022    9:33 AM 03/12/2022    4:36 AM 02/25/2022   11:35 AM 12/02/2021   11:45 AM  Diabetic Labs  HbA1c 4.0 - 5.6 % 6.9    7.4    Creatinine 0.76 - 1.27 mg/dL  4.09  8.11  9.14  7.82       01/14/2023   11:50 AM 01/14/2023   11:41 AM 09/11/2022    8:42 AM 07/13/2022    9:05 AM 03/12/2022   10:17 AM  BP/Weight  Systolic BP 152 142   124 133  Diastolic BP 74 76  80 75  Wt. (Lbs)  179 170    BMI  30.73 kg/m2 29.18 kg/m2        07/09/2020    1:45 PM 06/17/2017    4:00 PM  Foot/eye exam completion dates  Foot Form Completion Done Done    [place]  reports that he quit smoking about 50 years ago. His smoking use included cigarettes. He has never used smokeless tobacco. He reports current alcohol use of about 3.0 standard drinks of alcohol per week. He reports that he does not use drugs.     Assessment & Plan:    Type 2 diabetes mellitus with complications (HCC) - Plan: POCT glycosylated hemoglobin (Hb A1C)  Hypertension associated with diabetes (HCC)  Hyperlipidemia associated with type 2 diabetes mellitus (HCC)  Arthritis associated with diabetes (HCC)  Onychomycosis  Lymphedema  Obesity (BMI 30-39.9) Recommend to Tylenol 3 times per day as needed for the pain and then can add 2 Aleve twice per day as needed for the discomfort.  Discussed the fact that he needs to keep his legs elevated is much as possible to help with the edema.  The A1c of 6.9 is really pretty good all things considered.

## 2023-02-05 ENCOUNTER — Other Ambulatory Visit: Payer: Self-pay | Admitting: Family Medicine

## 2023-02-05 DIAGNOSIS — I152 Hypertension secondary to endocrine disorders: Secondary | ICD-10-CM

## 2023-02-05 DIAGNOSIS — E118 Type 2 diabetes mellitus with unspecified complications: Secondary | ICD-10-CM

## 2023-02-10 ENCOUNTER — Other Ambulatory Visit: Payer: Self-pay | Admitting: Family Medicine

## 2023-02-10 DIAGNOSIS — E1169 Type 2 diabetes mellitus with other specified complication: Secondary | ICD-10-CM

## 2023-02-19 ENCOUNTER — Ambulatory Visit: Payer: Medicare PPO | Admitting: Medical

## 2023-02-19 VITALS — BP 120/70 | HR 81 | Temp 97.5°F

## 2023-02-19 DIAGNOSIS — E118 Type 2 diabetes mellitus with unspecified complications: Secondary | ICD-10-CM

## 2023-02-19 DIAGNOSIS — S81802A Unspecified open wound, left lower leg, initial encounter: Secondary | ICD-10-CM

## 2023-02-19 DIAGNOSIS — I890 Lymphedema, not elsewhere classified: Secondary | ICD-10-CM | POA: Diagnosis not present

## 2023-02-19 DIAGNOSIS — E785 Hyperlipidemia, unspecified: Secondary | ICD-10-CM | POA: Diagnosis not present

## 2023-02-19 DIAGNOSIS — E1169 Type 2 diabetes mellitus with other specified complication: Secondary | ICD-10-CM | POA: Diagnosis not present

## 2023-02-19 DIAGNOSIS — I739 Peripheral vascular disease, unspecified: Secondary | ICD-10-CM | POA: Diagnosis not present

## 2023-02-19 MED ORDER — AMOXICILLIN-POT CLAVULANATE 875-125 MG PO TABS: 1.0000 | ORAL_TABLET | Freq: Two times a day (BID) | ORAL | 0 refills | Status: DC

## 2023-02-19 NOTE — Progress Notes (Signed)
Subjective:  Joe Barry is a 89 y.o. male who presents for Chief Complaint  Patient presents with   wound on leg    Wound on left leg. Hit it going into store been treating at home since May. Smell started yesterday     Here for wound of left leg.  Here with daughter.  Lives in [place]  Medical history includes diabetes, peripheral vascular disease, lymphedema, HTN, hyperlipidemia, AAA, arthritis, BPH, prior femoral artery vascular surgery.  He initially bumped his leg on May/10/2022.  His daughter was pushing him in wheelchair, and his left lower leg got  Had bleeding and gash wound initially.  Daughter has been helping to treat this at home for the past month with OTC remedies, but in past few days starting to get odor.  He denies fever, body aches or chills.    No other aggravating or relieving factors.    No other c/o.  Past Medical History:  Diagnosis Date   AAA (abdominal aortic aneurysm) (HCC)    Arthritis    BPH (benign prostatic hypertrophy)    Cataract    Diabetes mellitus    Dyslipidemia    Hypertension    Dr. Romilda Joy,    Neuromuscular disorder [facility])    carpal tunnel in right wrist   No blood products    Refusal of blood transfusions as patient is Jehovah's Witness    Current Outpatient Medications on File Prior to Visit  Medication Sig Dispense Refill   acetaminophen (TYLENOL) 500 MG tablet Take 500 mg by mouth every 6 (six) hours as needed for moderate pain.     amLODipine (NORVASC) 10 MG tablet TAKE 1 TABLET(10 MG) BY MOUTH DAILY 90 tablet 1   finasteride (PROSCAR) 5 MG tablet Take 5 mg by mouth daily.     hydrochlorothiazide (MICROZIDE) 12.5 MG capsule TAKE 1 CAPSULE(12.5 MG) BY MOUTH DAILY 90 capsule 0   ibuprofen (ADVIL) 200 MG tablet Take 200 mg by mouth every 6 (six) hours as needed for mild pain.     JANUVIA 50 MG tablet TAKE 1 TABLET(50 MG) BY MOUTH DAILY 90 tablet 0   lovastatin (MEVACOR) 40 MG tablet TAKE 1 TABLET(40 MG) BY MOUTH AT BEDTIME 30  tablet 3   MYRBETRIQ 50 MG TB24 tablet Take 50 mg by mouth daily.     pioglitazone-metformin (ACTOPLUS MET) 15-850 MG tablet TAKE 1 TABLET BY MOUTH TWICE DAILY WITH A MEAL (Patient taking differently: Take 1 tablet by mouth daily.) 180 tablet 0   Accu-Chek FastClix Lancets MISC USE AS DIRECTED 102 each 0   blood glucose meter kit and supplies KIT Dispense based on patient and insurance preference. Use up to four times daily as directed. 1 each 0   Blood Glucose Monitoring Suppl (FORA V12 BLOOD GLUCOSE SYSTEM) DEVI Testing once a day     glucose blood (ACCU-CHEK GUIDE) test strip CHECK BLOOD GLUCOSE TWICE DAILY 200 strip 1   Lancet Devices (LITE TOUCH LANCING DEVICE) MISC      No current facility-administered medications on file prior to visit.   Past Surgical History:  Procedure Laterality Date   CARPAL TUNNEL RELEASE     CHOLECYSTECTOMY     COLONOSCOPY  2008   EYE SURGERY     bilat cataract    FALSE ANEURYSM REPAIR  07/16/2012   Procedure: REPAIR FALSE ANEURYSM;  Surgeon: Larina Earthly, MD;  Location: [facility] OR;  Service: Vascular;  Laterality: Bilateral;  false femoral aneurysm repair with femoral artery repair  GAS/FLUID EXCHANGE  01/28/2012   Procedure: GAS/FLUID EXCHANGE;  Surgeon: Sherrie George, MD;  Location: [facility] OR;  Service: Ophthalmology;  Laterality: Right;   I & D EXTREMITY  09/23/2012   Procedure: IRRIGATION AND DEBRIDEMENT EXTREMITY;  Surgeon: Larina Earthly, MD;  Location: [facility] OR;  Service: Vascular;  Laterality: Right;  Irrigation and Debridement prepubic and right groin wound   PARS PLANA VITRECTOMY W/ REPAIR OF MACULAR HOLE  01/2012   PARS PLANA VITRECTOMY W/ SCLERAL BUCKLE  01/28/2012   Procedure: PARS PLANA VITRECTOMY WITH LASER FOR MACULAR HOLE;  Surgeon: Sherrie George, MD;  Location: [facility] OR;  Service: Ophthalmology;  Laterality: Right;  25 gauge ppv right eye    SIGMOIDOSCOPY     SKIN BIOPSY Left 10/21/2020   pyogenic granuloma ulcerated    TOTAL KNEE ARTHROPLASTY  2009    Left   TRANSURETHRAL RESECTION OF PROSTATE N/A 03/11/2022   Procedure: TRANSURETHRAL RESECTION OF THE PROSTATE (TURP);  Surgeon: Sebastian Ache, MD;  Location: [facility] ORS;  Service: Urology;  Laterality: N/A;  75 MINS     The following portions of the patient's history were reviewed and updated as appropriate: allergies, current medications, past family history, past medical history, past social history, past surgical history and problem list.  ROS Otherwise as in subjective above    Objective: BP 120/70   Pulse 81   Temp (!) 97.5 F (36.4 C)   General appearance: alert, no distress, well developed, well nourished Left lower anterior leg mid shaft region with ulcerated wound, 7cm long x 2.5 cm wide x approx 8-45mm deep at least.  Most whitish yellow sloughing tissues present with some serous drainage and mild odor.  inferior and more medial to this is superficial wound approx 3 cm x 2 cm.  Neither wound with warmth or fluctuance.  There is slight erythema of the larger wound around the perimeter of the wound, mild induration surrounding wound as well. He has generalized thickened skin of lower extremities, with dark brown/purple coloration for chronic vascular disease and large extremities from lymphedema.    Legs nontender today Pulses 1+ pedal pulses bilat Seated in wheelchair    Assessment: Encounter Diagnoses  Name Primary?   Wound of left lower extremity, initial encounter Yes   Lymphedema    PVD (peripheral vascular disease) (HCC)    Type 2 diabetes mellitus with complications (HCC)    Hyperlipidemia associated with type 2 diabetes mellitus (HCC)      Plan: Discussed findings, underlying history.  Go for xray today to help eval for any bone findings  Please go to [facility] for your left lower leg xray.   Their hours are 8am - 4:30 pm Monday - Friday.  Take your insurance card with you.  [facility] [phone]  [address] [place], [place]  [address]   Begin antibiotic Augmentin twice daily for 10 days   Continue with the home remedy you have been using daily.   Referral placed today: [facility] Address: [address], [place], [place] [address] Phone: [phone]  Appointment next Friday, February 26, 2023, 10am, arrive at least 15 minutes early with insurance information   If any worsening in the meantime, such as pain, worse redness, pus drainage, fever, or other then get re-evaluated right away.  Dia was seen today for wound on leg.  Diagnoses and all orders for this visit:  Wound of left lower extremity, initial encounter -  DG Tibia/Fibula Left; Future  Lymphedema -     DG Tibia/Fibula Left; Future  PVD (peripheral vascular disease) (HCC) -     DG Tibia/Fibula Left; Future  Type 2 diabetes mellitus with complications (HCC) -     DG Tibia/Fibula Left; Future  Hyperlipidemia associated with type 2 diabetes mellitus (HCC) -     DG Tibia/Fibula Left; Future  Other orders -     amoxicillin-clavulanate (AUGMENTIN) 875-125 MG tablet; Take 1 tablet by mouth 2 (two) times daily.    Follow up: pending xray, referral

## 2023-02-19 NOTE — Patient Instructions (Signed)
Discussed findings, underlying history.  Go for xray today to help eval for any bone findings  Please go to [facility] for your left lower leg xray.   Their hours are 8am - 4:30 pm Monday - Friday.  Take your insurance card with you.  [facility] [phone]  [address] [place], [place] [address]   Begin antibiotic Augmentin twice daily for 10 days   Continue with the home remedy you have been using daily.   Referral placed today: [facility] Address: [address], [place], [place] [address] Phone: [phone]  Appointment next Friday, February 26, 2023, 10am, arrive at least 15 minutes early with insurance information   If any worsening in the meantime, such as pain, worse redness, pus drainage, fever, or other then get re-evaluated right away.

## 2023-02-22 ENCOUNTER — Ambulatory Visit
Admission: RE | Admit: 2023-02-22 | Discharge: 2023-02-22 | Disposition: A | Discharge status: Home / Self Care | Payer: Medicare PPO | Source: Ambulatory Visit | Attending: Medical | Admitting: Medical

## 2023-02-22 DIAGNOSIS — S81802A Unspecified open wound, left lower leg, initial encounter: Secondary | ICD-10-CM

## 2023-02-22 DIAGNOSIS — I739 Peripheral vascular disease, unspecified: Secondary | ICD-10-CM

## 2023-02-22 DIAGNOSIS — E1169 Type 2 diabetes mellitus with other specified complication: Secondary | ICD-10-CM

## 2023-02-22 DIAGNOSIS — I890 Lymphedema, not elsewhere classified: Secondary | ICD-10-CM

## 2023-02-22 DIAGNOSIS — L97829 Non-pressure chronic ulcer of other part of left lower leg with unspecified severity: Secondary | ICD-10-CM | POA: Diagnosis not present

## 2023-02-22 DIAGNOSIS — E118 Type 2 diabetes mellitus with unspecified complications: Secondary | ICD-10-CM

## 2023-02-26 ENCOUNTER — Encounter: Payer: Medicare PPO | Attending: Physician Assistant | Admitting: Physician Assistant

## 2023-02-26 DIAGNOSIS — W228XXA Striking against or struck by other objects, initial encounter: Secondary | ICD-10-CM | POA: Diagnosis not present

## 2023-02-26 DIAGNOSIS — L97822 Non-pressure chronic ulcer of other part of left lower leg with fat layer exposed: Secondary | ICD-10-CM | POA: Insufficient documentation

## 2023-02-26 DIAGNOSIS — E11622 Type 2 diabetes mellitus with other skin ulcer: Secondary | ICD-10-CM | POA: Insufficient documentation

## 2023-02-26 DIAGNOSIS — Z7984 Long term (current) use of oral hypoglycemic drugs: Secondary | ICD-10-CM | POA: Diagnosis not present

## 2023-02-26 DIAGNOSIS — E1151 Type 2 diabetes mellitus with diabetic peripheral angiopathy without gangrene: Secondary | ICD-10-CM | POA: Insufficient documentation

## 2023-02-26 DIAGNOSIS — S81812A Laceration without foreign body, left lower leg, initial encounter: Secondary | ICD-10-CM | POA: Insufficient documentation

## 2023-02-26 DIAGNOSIS — L97922 Non-pressure chronic ulcer of unspecified part of left lower leg with fat layer exposed: Secondary | ICD-10-CM | POA: Insufficient documentation

## 2023-02-26 DIAGNOSIS — I10 Essential (primary) hypertension: Secondary | ICD-10-CM | POA: Diagnosis not present

## 2023-02-26 DIAGNOSIS — I890 Lymphedema, not elsewhere classified: Secondary | ICD-10-CM | POA: Diagnosis not present

## 2023-02-26 DIAGNOSIS — S81802A Unspecified open wound, left lower leg, initial encounter: Secondary | ICD-10-CM | POA: Diagnosis not present

## 2023-02-28 NOTE — Progress Notes (Signed)
BOND, GOLDFINE ([id number]) [id number] Page 1 of 10 Visit Report for 02/26/2023 Chief Complaint Document Details Patient Name: Date of Service: Joe Barry [facility] R. 02/26/2023 10:00 A M Medical Record Number: [id number] Patient Account Number: [id number] Date of Birth/Sex: Treating RN: 22-Jun-1934 (89 y.o. M) Primary Care Provider: [name] Other Clinician: Referring Provider: Treating Provider/Extender: [name] in Treatment: 0 Information Obtained from: Patient Chief Complaint Left LE Ulcers Electronic Signature(s) Signed: 02/26/2023 11:01:50 AM By: [name] PA-C Entered By: [name] on 02/26/2023 11:01:49 -------------------------------------------------------------------------------- Debridement Details Patient Name: Date of Service: Joe Barry [name] 02/26/2023 10:00 A M Medical Record Number: [id number] Patient Account Number: [id number] Date of Birth/Sex: Treating RN: [birth year] (89 y.o. [name] Primary Care Provider: [name] Other Clinician: Referring Provider: Treating Provider/Extender: [name] in Treatment: 0 Debridement Performed for Assessment: Wound #1 Left,Proximal,Midline Lower Leg Performed By: Physician [name], PA-C Debridement Type: Debridement Level of Consciousness (Pre-procedure): Awake and Alert Pre-procedure Verification/Time Out Yes - 11:10 Taken: Pain Control: Lidocaine 4% T opical Solution Percent of Wound Bed Debrided: 100% T Area Debrided (cm): otal 10.79 Tissue and other material debrided: Viable, Non-Viable, Slough, Subcutaneous, Slough Level: Skin/Subcutaneous Tissue Debridement Description: Excisional Instrument: Curette Bleeding: Moderate Hemostasis Achieved: Pressure Response to Treatment: Procedure was tolerated well Level of Consciousness (Post- Awake and Alert procedure): FREDY, CHIRINOS ([id number])  [phone].pdf Page 2 of 10 Post Debridement Measurements of Total Wound Length: (cm) 5.5 Width: (cm) 2.5 Depth: (cm) 1 Volume: (cm) 10.799 Character of Wound/Ulcer Post Debridement: Stable Post Procedure Diagnosis Same as Pre-procedure Electronic Signature(s) Signed: 02/26/2023 1:44:39 PM By: [name] PA-C Signed: 02/26/2023 1:57:56 PM By: [name] Entered By: [name] on 02/26/2023 11:13:39 -------------------------------------------------------------------------------- HPI Details Patient Name: Date of Service: Joe Barry [name] 02/26/2023 10:00 A M Medical Record Number: [id number] Patient Account Number: [id number] Date of Birth/Sex: Treating RN: [birth year] (89 y.o. M) Primary Care Provider: [name] Other Clinician: Referring Provider: Treating Provider/Extender: [name] in Treatment: 0 History of Present Illness HPI Description: 02-26-2023 upon evaluation today patient presents for initial inspection here in our clinic concerning issues that he has been having with a wound on his left anterior lower leg. This actually began on 01-14-2023 when the patient was getting out of the car and subsequently they were turning the wheelchair around to move around the door and got too close to the edge of the door and the corner at the bottom actually cut his leg. This because of his lymphedema has been a very hard wound to heal following. Fortunately there does not appear to be any signs of infection systemically which is good news. With that being said even locally I do not see any signs of infection at this point. I do believe that he is making fairly good progress all things considered although I think that we definitely need to clean some this out and get it moving in the right direction. Patient has been using Medih[employer] or rather his daughter has at home she is a Engineer, civil (consulting) she works in the ICU office seen her previously as  well as a patient. Subsequently after it was apparent that this was not getting better they did go on 7 June to [place] [facility] and at that point they actually put the patient on Augmentin which she started on 02-19-2023 and is still continuing that through current. His most recent hemoglobin A1c was  6.9 on 01-16-2023. He did have an x-ray done of the leg but this has not been released yet it still pending a reading. Currently could not tolerate the ABI screening but he seems to have good perfusion in regard to his leg I do not know that we go to gotten a lot of a good reading anyway when it comes to the ABIs even if we were able to do the screening due to the amount of lymphedema that he has with associated swelling. Patient does have a history of lymphedema, diabetes mellitus type 2, he is on oral hypoglycemic agents. He also has a history of hypertension and peripheral vascular disease although his last ABI check in 2018 was normal according to his daughter and he has seen vascular again at that time but has not had any recent ABIs performed. Electronic Signature(s) Signed: 02/26/2023 11:46:40 AM By: [name] PA-C Entered By: [name] on 02/26/2023 11:46:40 [name] ([id number]) [id number] Page 3 of 10 -------------------------------------------------------------------------------- Physical Exam Details Patient Name: Date of Service: Joe Barry [place] R. 02/26/2023 10:00 A M Medical Record Number: [id number] Patient Account Number: [id number] Date of Birth/Sex: Treating RN: 11-Mar-1934 (89 y.o. M) Primary Care Provider: [name] Other Clinician: Referring Provider: Treating Provider/Extender: [name] in Treatment: 0 Constitutional patient is hypertensive.. pulse regular and within target range for patient.Marland Kitchen respirations regular, non-labored and within target range for patient.Marland Kitchen temperature within target range for  patient.. Well-nourished and well-hydrated in no acute distress. Eyes conjunctiva clear no eyelid edema noted. pupils equal round and reactive to light and accommodation. Ears, Nose, Mouth, and Throat no gross abnormality of ear auricles or external auditory canals. normal hearing noted during conversation. mucus membranes moist. Respiratory normal breathing without difficulty. Cardiovascular Patient does have bilateral nonpitting edema secondary to lymphedema. Because of this is also difficult to get palpable pulses although Doppler wise he had good pulses. He also has good capillary refill.. Musculoskeletal normal gait and posture. no significant deformity or arthritic changes, no loss or range of motion, no clubbing. Psychiatric this patient is able to make decisions and demonstrates good insight into disease process. Alert and Oriented x 3. pleasant and cooperative. Notes Upon inspection patient's wound bed actually showed signs of pretty good granulation and almost small amount of the area although there was a significant mount of slough buildup in the majority of the wound. I discussed this with the patient today and I do believe that we need to get things moving in a much better direction as far as healing is concerned going forward. In order to do this we do need to clean this out and I discussed that with him today. He subsequently did agree to the debridement and we were able to perform debridement clearway some of the necrotic debris in fact postdebridement the wound bed actually appears to be doing much better and I am very pleased he seems to have good arterial perfusion with some bleeding noted even with this debridement today which was not terribly aggressive due to discomfort. Electronic Signature(s) Signed: 02/26/2023 11:47:48 AM By: [name] PA-C Entered By: [name] on 02/26/2023  11:47:48 -------------------------------------------------------------------------------- Physician Orders Details Patient Name: Date of Service: Joe Barry [name] 02/26/2023 10:00 A M Medical Record Number: [id number] Patient Account Number: [id number] Date of Birth/Sex: Treating RN: Jan 21, 1934 (89 y.o. [name] Primary Care Provider: [name] Other Clinician: Referring Provider: Treating Provider/Extender: Coralie Carpen, Henderson R (  [id number]) [id number] Page 4 of 10 Weeks in Treatment: 0 Verbal / Phone Orders: No Diagnosis Coding ICD-10 Coding Code Description [phone] Laceration without foreign body, left lower leg, initial encounter [phone] Non-pressure chronic ulcer of other part of left lower leg with fat layer exposed I89.0 Lymphedema, not elsewhere classified E11.622 Type 2 diabetes mellitus with other skin ulcer Z79.84 Long term (current) use of oral hypoglycemic drugs I10 Essential (primary) hypertension I73.89 Other specified peripheral vascular diseases Follow-up Appointments Return Appointment in 1 week. Nurse Visit as needed - to check wound and re-wrap left lower leg Bathing/ Shower/ Hygiene May shower with wound dressing protected with water repellent cover or cast protector. - pt advised to not get wrap wet No tub bath. Anesthetic (Use 'Patient Medications' Section for Anesthetic Order Entry) Lidocaine applied to wound bed Edema Control - Lymphedema / Segmental Compressive Device / Other UrgoK2 LITE - size large used Elevate, Exercise Daily and A void Standing for Long Periods of Time. Elevate leg(s) parallel to the floor when sitting. DO YOUR BEST to sleep in the bed at night. DO NOT sleep in your recliner. Long hours of sitting in a recliner leads to swelling of the legs and/or potential wounds on your backside. Other: - discussed juxtalite for legs in future once swelling is  down Medications-Please add to medication list. ntibiotics - continue taking previously prescribed oral antibiotics P.O. A Wound Treatment Wound #1 - Lower Leg Wound Laterality: Left, Midline, Proximal Cleanser: Soap and Water 2 x Per Week/30 Days Discharge Instructions: Gently cleanse wound with antibacterial soap, rinse and pat dry prior to dressing wounds Prim Dressing: Silvercel Small 2x2 (in/in) 2 x Per Week/30 Days ary Discharge Instructions: Apply Silvercel Small 2x2 (in/in) as instructed Secondary Dressing: Zetuvit Plus 4x8 (in/in) 2 x Per Week/30 Days Compression Wrap: Urgo K2 Lite, two layer compression system, large 2 x Per Week/30 Days Wound #2 - Lower Leg Wound Laterality: Left, Medial, Distal Cleanser: Soap and Water 2 x Per Week/30 Days Discharge Instructions: Gently cleanse wound with antibacterial soap, rinse and pat dry prior to dressing wounds Prim Dressing: Silvercel Small 2x2 (in/in) 2 x Per Week/30 Days ary Discharge Instructions: Apply Silvercel Small 2x2 (in/in) as instructed Secondary Dressing: Zetuvit Plus 4x8 (in/in) 2 x Per Week/30 Days Compression Wrap: Urgo K2 Lite, two layer compression system, large 2 x Per Week/30 Days Electronic Signature(s) Signed: 02/26/2023 1:44:39 PM By: [name] PA-C Signed: 02/26/2023 1:57:56 PM By: [name] Previous Signature: 02/26/2023 12:15:01 PM Version By: [name] Entered By: [name] on 02/26/2023 13:38:45 [name] ([id number]) [id number] Page 5 of 10 -------------------------------------------------------------------------------- Problem List Details Patient Name: Date of Service: Joe Barry [facility] R. 02/26/2023 10:00 A M Medical Record Number: [id number] Patient Account Number: [id number] Date of Birth/Sex: Treating RN: 11/23/1933 (89 y.o. M) Primary Care Provider: [name] Other Clinician: Referring Provider: Treating Provider/Extender: [name] in Treatment: 0 Active Problems ICD-10 Encounter Code Description Active Date MDM Diagnosis S81.812A Laceration without foreign body, left lower leg, initial encounter 02/26/2023 No Yes L97.822 Non-pressure chronic ulcer of other part of left lower leg with fat layer exposed6/14/2024 No Yes I89.0 Lymphedema, not elsewhere classified 02/26/2023 No Yes E11.622 Type 2 diabetes mellitus with other skin ulcer 02/26/2023 No Yes Z79.84 Long term (current) use of oral hypoglycemic drugs 02/26/2023 No Yes I10 Essential (primary) hypertension 02/26/2023 No Yes I73.89 Other specified peripheral vascular diseases 02/26/2023 No Yes Inactive Problems Resolved Problems Electronic Signature(s) Signed: 02/26/2023  11:01:17 AM By: [name] PA-C Entered By: [name] on 02/26/2023 11:01:17 [name] ([id number]) [id number] Page 6 of 10 -------------------------------------------------------------------------------- Progress Note Details Patient Name: Date of Service: Joe Barry [facility] R. 02/26/2023 10:00 A M Medical Record Number: [id number] Patient Account Number: [id number] Date of Birth/Sex: Treating RN: [birth year] (89 y.o. M) Primary Care Provider: [name] Other Clinician: Referring Provider: Treating Provider/Extender: [name] in Treatment: 0 Subjective Chief Complaint Information obtained from Patient Left LE Ulcers History of Present Illness (HPI) 02-26-2023 upon evaluation today patient presents for initial inspection here in our clinic concerning issues that he has been having with a wound on his left anterior lower leg. This actually began on 01-14-2023 when the patient was getting out of the car and subsequently they were turning the wheelchair around to move around the door and got too close to the edge of the door and the corner at the bottom actually cut his leg. This because of his lymphedema has been a very hard  wound to heal following. Fortunately there does not appear to be any signs of infection systemically which is good news. With that being said even locally I do not see any signs of infection at this point. I do believe that he is making fairly good progress all things considered although I think that we definitely need to clean some this out and get it moving in the right direction. Patient has been using Medih[employer] or rather his daughter has at home she is a Engineer, civil (consulting) she works in the ICU office seen her previously as well as a patient. Subsequently after it was apparent that this was not getting better they did go on 7 June to [place] [facility] and at that point they actually put the patient on Augmentin which she started on 02-19-2023 and is still continuing that through current. His most recent hemoglobin A1c was 6.9 on 01-16-2023. He did have an x-ray done of the leg but this has not been released yet it still pending a reading. Currently could not tolerate the ABI screening but he seems to have good perfusion in regard to his leg I do not know that we go to gotten a lot of a good reading anyway when it comes to the ABIs even if we were able to do the screening due to the amount of lymphedema that he has with associated swelling. Patient does have a history of lymphedema, diabetes mellitus type 2, he is on oral hypoglycemic agents. He also has a history of hypertension and peripheral vascular disease although his last ABI check in 2018 was normal according to his daughter and he has seen vascular again at that time but has not had any recent ABIs performed. Patient History Information obtained from Patient, Caregiver. Allergies No Known Drug Allergies Social History Former smoker - quit 1973, Alcohol Use - Rarely, Drug Use - No History, Caffeine Use - Daily. Medical History Eyes Patient has history of Cataracts Hematologic/Lymphatic Patient has history of Lymphedema Cardiovascular Patient  has history of Hypertension, Peripheral Venous Disease Endocrine Patient has history of Type II Diabetes Patient is treated with Oral Agents. Blood sugar is not tested. Review of Systems (ROS) Constitutional Symptoms (General Health) Denies complaints or symptoms of Fatigue, Fever, Chills, Marked Weight Change. Ear/Nose/Mouth/Throat Denies complaints or symptoms of Difficult clearing ears, Sinusitis. Respiratory Denies complaints or symptoms of Chronic or frequent coughs, Shortness of Breath. Gastrointestinal Denies complaints or symptoms of Frequent diarrhea, Nausea, Vomiting.  Genitourinary Denies complaints or symptoms of Kidney failure/ Dialysis, Incontinence/dribbling. Immunological Denies complaints or symptoms of Hives, Itching. Integumentary (Skin) Denies complaints or symptoms of Wounds, Bleeding or bruising tendency, Breakdown, Swelling. Musculoskeletal Complains or has symptoms of Muscle Weakness - genral weakness, arthritis ETHERIDGE, WHITMARSH ([id number]) [id number] Page 7 of 10 Neurologic Denies complaints or symptoms of Numbness/parasthesias, Focal/Weakness. Psychiatric Denies complaints or symptoms of Anxiety, Claustrophobia. Objective Constitutional patient is hypertensive.. pulse regular and within target range for patient.Marland Kitchen respirations regular, non-labored and within target range for patient.Marland Kitchen temperature within target range for patient.. Well-nourished and well-hydrated in no acute distress. Vitals Time Taken: 10:18 AM, Height: 65 in, Source: Stated, Weight: 180 lbs, Source: Stated, BMI: 30, Temperature: 97.6 F, Pulse: 85 bpm, Respiratory Rate: 18 breaths/min, Blood Pressure: 175/80 mmHg. Eyes conjunctiva clear no eyelid edema noted. pupils equal round and reactive to light and accommodation. Ears, Nose, Mouth, and Throat no gross abnormality of ear auricles or external auditory canals. normal hearing noted during conversation. mucus  membranes moist. Respiratory normal breathing without difficulty. Cardiovascular Patient does have bilateral nonpitting edema secondary to lymphedema. Because of this is also difficult to get palpable pulses although Doppler wise he had good pulses. He also has good capillary refill.. Musculoskeletal normal gait and posture. no significant deformity or arthritic changes, no loss or range of motion, no clubbing. Psychiatric this patient is able to make decisions and demonstrates good insight into disease process. Alert and Oriented x 3. pleasant and cooperative. General Notes: Upon inspection patient's wound bed actually showed signs of pretty good granulation and almost small amount of the area although there was a significant mount of slough buildup in the majority of the wound. I discussed this with the patient today and I do believe that we need to get things moving in a much better direction as far as healing is concerned going forward. In order to do this we do need to clean this out and I discussed that with him today. He subsequently did agree to the debridement and we were able to perform debridement clearway some of the necrotic debris in fact postdebridement the wound bed actually appears to be doing much better and I am very pleased he seems to have good arterial perfusion with some bleeding noted even with this debridement today which was not terribly aggressive due to discomfort. Integumentary (Hair, Skin) Wound #1 status is Open. Original cause of wound was Trauma. The date acquired was: 01/14/2023. The wound is located on the Left,Proximal,Midline Lower Leg. The wound measures 5.5cm length x 2.5cm width x 0.3cm depth; 10.799cm^2 area and 3.24cm^3 volume. There is Fat Layer (Subcutaneous Tissue) exposed. There is no tunneling or undermining noted. There is a medium amount of serosanguineous drainage noted. There is small (1-33%) pink granulation within the wound bed. There is a large  (67-100%) amount of necrotic tissue within the wound bed. Wound #2 status is Open. Original cause of wound was Gradually Appeared. The date acquired was: 01/28/2023. The wound is located on the Left,Distal,Medial Lower Leg. The wound measures 5.5cm length x 3cm width x 0.1cm depth; 12.959cm^2 area and 1.296cm^3 volume. There is no tunneling or undermining noted. There is a medium amount of serosanguineous drainage noted. There is large (67-100%) pink, pale granulation within the wound bed. There is a small (1- 33%) amount of necrotic tissue within the wound bed including Adherent Slough. Assessment Active Problems ICD-10 Laceration without foreign body, left lower leg, initial encounter Non-pressure chronic ulcer of other part of  left lower leg with fat layer exposed Lymphedema, not elsewhere classified Type 2 diabetes mellitus with other skin ulcer Long term (current) use of oral hypoglycemic drugs Essential (primary) hypertension Other specified peripheral vascular diseases Procedures Wound #1 Pre-procedure diagnosis of Wound #1 is a Trauma, Other located on the Left,Proximal,Midline Lower Leg . There was a Excisional Skin/Subcutaneous Tissue Debridement with a total area of 10.79 sq cm performed by [name], PA-C. With the following instrument(s): Curette to remove Viable and Non-Viable tissue/material. Material removed includes Subcutaneous Tissue and Slough and after achieving pain control using Lidocaine 4% Topical Solution. No specimens DONOVYN, ALTERMATT ([id number]) [id number] Page 8 of 10 were taken. A time out was conducted at 11:10, prior to the start of the procedure. A Moderate amount of bleeding was controlled with Pressure. The procedure was tolerated well. Post Debridement Measurements: 5.5cm length x 2.5cm width x 1cm depth; 10.799cm^3 volume. Character of Wound/Ulcer Post Debridement is stable. Post procedure Diagnosis Wound #1: Same as  Pre-Procedure Plan 1. Based on what I am seeing I am good recommend currently that we go ahead and utilize a silver alginate dressing in the base of the wound to try to help clean this up. This is because of the amount of drainage I think is good to be preferable over going with something such as Iodoflex which may help clean up better I do not think we do as well as far as the drainage is concerned absorbing what needs to be done here. 2. I am good recommend as well that the patient should initiate treatment here with a Urgo K2 lite compression wrap in order to see what we can do with that as far as getting his edema under control. 3. I did discuss possibility of lymphedema pumps as well as the possibility of the juxta fit compression wraps. Both of which I think could be beneficial for him considering his longstanding lymphedema. Nonetheless the wraps of the first order business in my opinion the pumps will be next depending on how things progress. We will see patient back for reevaluation in 1 week here in the clinic. If anything worsens or changes patient will contact our office for additional recommendations. Electronic Signature(s) Signed: 02/26/2023 11:48:57 AM By: [name] PA-C Entered By: [name] on 02/26/2023 11:48:57 -------------------------------------------------------------------------------- ROS/PFSH Details Patient Name: Date of Service: Joe Barry [name] 02/26/2023 10:00 A M Medical Record Number: [id number] Patient Account Number: [id number] Date of Birth/Sex: Treating RN: [birth year] (89 y.o. [name] Primary Care Provider: [name] Other Clinician: Referring Provider: Treating Provider/Extender: [name] in Treatment: 0 Information Obtained From Patient Caregiver Constitutional Symptoms (General Health) Complaints and Symptoms: Negative for: Fatigue; Fever; Chills; Marked Weight Change Ear/Nose/Mouth/Throat Complaints  and Symptoms: Negative for: Difficult clearing ears; Sinusitis Respiratory Complaints and Symptoms: Negative for: Chronic or frequent coughs; Shortness of Breath Gastrointestinal Complaints and Symptoms: Negative for: Frequent diarrhea; Nausea; Vomiting Genitourinary Complaints and Symptoms: Negative for: Kidney failure/ Dialysis; Incontinence/dribbling AUGUSTA, PROPSON ([id number]) [id number] Page 9 of 10 Immunological Complaints and Symptoms: Negative for: Hives; Itching Integumentary (Skin) Complaints and Symptoms: Negative for: Wounds; Bleeding or bruising tendency; Breakdown; Swelling Musculoskeletal Complaints and Symptoms: Positive for: Muscle Weakness - genral weakness Review of System Notes: arthritis Neurologic Complaints and Symptoms: Negative for: Numbness/parasthesias; Focal/Weakness Psychiatric Complaints and Symptoms: Negative for: Anxiety; Claustrophobia Eyes Medical History: Positive for: Cataracts Hematologic/Lymphatic Medical History: Positive for: Lymphedema Cardiovascular Medical History: Positive for: Hypertension; Peripheral Venous Disease Endocrine  Medical History: Positive for: Type II Diabetes Time with diabetes: 50 years Treated with: Oral agents Blood sugar tested every day: No Oncologic HBO Extended History Items Eyes: Cataracts Immunizations Pneumococcal Vaccine: Received Pneumococcal Vaccination: No Implantable Devices None Family and Social History Former smoker - quit 1973; Alcohol Use: Rarely; Drug Use: No History; Caffeine Use: Daily Electronic Signature(s) Signed: 02/26/2023 1:44:39 PM By: [name] PA-C Signed: 02/26/2023 1:57:56 PM By: [name] Entered By: [name] on 02/26/2023 10:23:24 WILFORD, STRAYER ([id number]) [id number] Page 10 of 10 -------------------------------------------------------------------------------- SuperBill Details Patient Name:  Date of Service: Joe Barry [facility] R. 02/26/2023 Medical Record Number: [id number] Patient Account Number: [id number] Date of Birth/Sex: Treating RN: 08-07-1934 (89 y.o. M) Primary Care Provider: [name] Other Clinician: Referring Provider: Treating Provider/Extender: [name] in Treatment: 0 Diagnosis Coding ICD-10 Codes Code Description [phone] Laceration without foreign body, left lower leg, initial encounter L97.822 Non-pressure chronic ulcer of other part of left lower leg with fat layer exposed I89.0 Lymphedema, not elsewhere classified E11.622 Type 2 diabetes mellitus with other skin ulcer Z79.84 Long term (current) use of oral hypoglycemic drugs I10 Essential (primary) hypertension I73.89 Other specified peripheral vascular diseases Facility Procedures : CPT4 Code: 13086578 Description: 99213 - WOUND CARE VISIT-LEV 3 EST PT Modifier: Quantity: 1 : CPT4 Code: 46962952 Description: 11042 - DEB SUBQ TISSUE 20 SQ CM/< ICD-10 Diagnosis Description L97.822 Non-pressure chronic ulcer of other part of left lower leg with fat layer expos Modifier: ed Quantity: 1 Physician Procedures : CPT4 Code Description Modifier [id number] WC PHYS LEVEL 3 NEW PT 25 ICD-10 Diagnosis Description S81.812A Laceration without foreign body, left lower leg, initial encounter L97.822 Non-pressure chronic ulcer of other part of left lower leg with fat layer  exposed I89.0 Lymphedema, not elsewhere classified E11.622 Type 2 diabetes mellitus with other skin ulcer Quantity: 1 : [id number] 11042 - WC PHYS SUBQ TISS 20 SQ CM ICD-10 Diagnosis Description L97.822 Non-pressure chronic ulcer of other part of left lower leg with fat layer exposed Quantity: 1 Electronic Signature(s) Signed: 02/26/2023 1:39:21 PM By: [name] Signed: 02/26/2023 1:44:39 PM By: [name] PA-C Previous Signature: 02/26/2023 11:49:15 AM Version By: [name] PA-C Entered By: [name] on  02/26/2023 13:39:21

## 2023-02-28 NOTE — Progress Notes (Signed)
KYHEEM, BOULOS ([id number]) [id number] Page 1 of 5 Visit Report for 02/26/2023 Abuse Risk Screen Details Patient Name: Date of Service: Joe Barry [facility] [name] 02/26/2023 10:00 A M Medical Record Number: [id number] Patient Account Number: [id number] Date of Birth/Sex: Treating RN: 1933-12-06 (89 y.o. [name] Primary Care Ladoris Lythgoe: [name] Other Clinician: Referring Naquisha Whitehair: Treating Fani Rotondo/Extender: [name] in Treatment: 0 Abuse Risk Screen Items Answer ABUSE RISK SCREEN: Has anyone close to you tried to hurt or harm you recentlyo No Do you feel uncomfortable with anyone in your familyo No Has anyone forced you do things that you didnt want to doo No Electronic Signature(s) Signed: 02/26/2023 1:57:56 PM By: [name] Entered By: [name] on 02/26/2023 10:23:35 -------------------------------------------------------------------------------- Activities of Daily Living Details Patient Name: Date of Service: Joe Barry [place] [name] 02/26/2023 10:00 A M Medical Record Number: [id number] Patient Account Number: [id number] Date of Birth/Sex: Treating RN: 08-Jan-1934 (89 y.o. [name] Primary Care Iokepa Geffre: [name] Other Clinician: Referring Taylyn Brame: Treating Jaydence Arnesen/Extender: [name] in Treatment: 0 Activities of Daily Living Items Answer Activities of Daily Living (Please select one for each item) Drive Automobile Not Able T Medications ake Need Assistance Use T elephone Completely Able Care for Appearance Need Assistance Use T oilet Need Assistance Bath / Shower Need Assistance Dress Self Need Assistance Feed Self Completely Able Walk Need Assistance Get In / Out Bed Need Assistance Housework Need Assistance Joe Barry, Joe Barry ([id number]) [id number] Page 2 of 5 Prepare Meals Need Assistance Handle Money Completely Able Shop  for Self Need Assistance Electronic Signature(s) Signed: 02/26/2023 1:57:56 PM By: [name] Entered By: [name] on 02/26/2023 10:24:09 -------------------------------------------------------------------------------- Education Screening Details Patient Name: Date of Service: Joe Barry [facility] [name] 02/26/2023 10:00 A M Medical Record Number: [id number] Patient Account Number: [id number] Date of Birth/Sex: Treating RN: Mar 17, 1934 (89 y.o. [name] Primary Care Butler Vegh: [name] Other Clinician: Referring Beryl Balz: Treating Kijana Estock/Extender: [name] in Treatment: 0 Learning Preferences/Education Level/Primary Language Learning Preference: Explanation, Demonstration, Video, Communication Board, Printed Material Preferred Language: English Cognitive Barrier Language Barrier: No Translator Needed: No Memory Deficit: No Emotional Barrier: No Cultural/Religious Beliefs Affecting Medical Care: No Physical Barrier Impaired Vision: No Impaired Hearing: No Decreased Hand dexterity: No Knowledge/Comprehension Knowledge Level: Medium Comprehension Level: Medium Ability to understand written instructions: Medium Ability to understand verbal instructions: Medium Motivation Anxiety Level: Calm Cooperation: Cooperative Education Importance: Acknowledges Need Interest in Health Problems: Asks Questions Perception: Coherent Willingness to Engage in Self-Management High Activities: Readiness to Engage in Self-Management High Activities: Electronic Signature(s) Signed: 02/26/2023 1:57:56 PM By: [name] Entered By: [name] on 02/26/2023 10:24:26 Joe Barry, Joe Barry ([id number]) [phone] [id number] Page 3 of 5 -------------------------------------------------------------------------------- Fall Risk Assessment Details Patient Name: Date of Service: Joe Barry [facility] [name] 02/26/2023 10:00 A M Medical Record  Number: [id number] Patient Account Number: [id number] Date of Birth/Sex: Treating RN: 09-Feb-1934 (89 y.o. [name] Primary Care Sheneika Walstad: [name] Other Clinician: Referring Joe Barry Joe Barry: Treating Jahmel Flannagan/Extender: [name] in Treatment: 0 Fall Risk Assessment Items Have you had 2 or more falls in the last 12 monthso 0 No Have you had any fall that resulted in injury in the last 12 monthso 0 No FALLS RISK SCREEN History of falling - immediate or within 3 months 0 No Secondary diagnosis (Do you have 2 or more medical diagnoseso) 0 No Ambulatory aid None/bed  rest/wheelchair/nurse 0 Yes Crutches/cane/walker 0 No Furniture 0 No Intravenous therapy Access/Saline/Heparin Lock 0 No Gait/Transferring Normal/ bed rest/ wheelchair 0 Yes Weak (short steps with or without shuffle, stooped but able to lift head while walking, may seek 10 Yes support from furniture) Impaired (short steps with shuffle, may have difficulty arising from chair, head down, impaired 20 Yes balance) Mental Status Oriented to own ability 0 Yes Electronic Signature(s) Signed: 02/26/2023 1:57:56 PM By: [name] Entered By: [name] on 02/26/2023 10:24:54 -------------------------------------------------------------------------------- Foot Assessment Details Patient Name: Date of Service: Joe Barry [facility] [name] 02/26/2023 10:00 A M Medical Record Number: [id number] Patient Account Number: [id number] Date of Birth/Sex: Treating RN: [birth year] (89 y.o. [name] Primary Care Terius Jacuinde: [name] Other Clinician: Referring Nivedita Mirabella: Treating Keerthi Hazell/Extender: [name] in Treatment: 0 Foot Assessment Items Site Locations Joe Barry, Joe Barry ([id number]) [id number] Page 4 of 5 + = Sensation present, - = Sensation absent, C = Callus, U = Ulcer Barry = Redness, W = Warmth, M = Maceration, PU = Pre-ulcerative  lesion F = Fissure, S = Swelling, D = Dryness Assessment Right: Left: Other Deformity: No No Prior Foot Ulcer: No No Prior Amputation: No No Charcot Joint: No No Ambulatory Status: Ambulatory With Help Assistance Device: Walker Gait: Surveyor, mining) Signed: 02/26/2023 1:57:56 PM By: [name] Entered By: [name] on 02/26/2023 10:31:50 -------------------------------------------------------------------------------- Nutrition Risk Screening Details Patient Name: Date of Service: Joe Barry [facility] [name] 02/26/2023 10:00 A M Medical Record Number: [id number] Patient Account Number: [id number] Date of Birth/Sex: Treating RN: April 21, 1934 (89 y.o. [name] Primary Care Keandra Medero: [name] Other Clinician: Referring Suzzane Quilter: Treating Blaine Guiffre/Extender: [name] in Treatment: 0 Height (in): 65 Weight (lbs): 180 Body Mass Index (BMI): 30 Nutrition Risk Screening Items Score Screening NUTRITION RISK SCREEN: I have an illness or condition that made me change the kind and/or amount of food I eat 0 No I eat fewer than two meals per day 0 No I eat few fruits and vegetables, or milk products 0 No I have three or more drinks of beer, liquor or wine almost every day 0 No I have tooth or mouth problems that make it hard for me to eat 0 No Joe Barry, Joe Barry ([id number]) [id number] [id number] Page 5 of 5 I don't always have enough money to buy the food I need 0 No I eat alone most of the time 0 No I take three or more different prescribed or over-the-counter drugs a day 0 No Without wanting to, I have lost or gained 10 pounds in the last six months 0 No I am not always physically able to shop, cook and/or feed myself 0 No Nutrition Protocols Good Risk Protocol 0 No interventions needed Moderate Risk Protocol High Risk Proctocol Risk Level: Good Risk Score: 0 Electronic Signature(s) Signed: 02/26/2023 1:57:56  PM By: [name] Entered By: [name] on 02/26/2023 10:25:07

## 2023-02-28 NOTE — Progress Notes (Signed)
Joe Barry ([id number]) [id number] Page 1 of 10 Visit Report for 02/26/2023 Allergy List Details Patient Name: Date of Service: Joe Barry [facility] R. 02/26/2023 10:00 A M Medical Record Number: [id number] Patient Account Number: [id number] Date of Birth/Sex: Treating RN: 1934/06/24 (89 y.o. Joe Barry Primary Care [name]: Joe Barry Other Clinician: Referring Joe Barry: Treating Joe Barry/Extender: Joe Barry in Treatment: 0 Allergies Active Allergies No Known Drug Allergies Allergy Notes Electronic Signature(s) Signed: 02/26/2023 1:57:56 PM By: Joe Barry Entered By: Joe Barry on 02/26/2023 10:19:52 -------------------------------------------------------------------------------- Arrival Information Details Patient Name: Date of Service: Joe Barry [name] 02/26/2023 10:00 A M Medical Record Number: [id number] Patient Account Number: [id number] Date of Birth/Sex: Treating RN: 04/28/1934 (89 y.o. Joe Barry Primary Care Joe Barry: Joe Barry Other Clinician: Referring Joe Barry: Treating Joe Barry/Extender: Joe Barry in Treatment: 0 Visit Information Patient Arrived: Wheel Chair Arrival Time: 10:11 Accompanied By: self Transfer Assistance: EasyPivot Patient Lift Patient Identification Verified: Yes Secondary Verification Process Completed: Yes Electronic Signature(s) Signed: 02/26/2023 1:57:56 PM By: Joe Barry Entered By: Joe Barry on 02/26/2023 10:17:33 Joe Barry ([id number]) [id number] Page 2 of 10 -------------------------------------------------------------------------------- Clinic Level of Care Assessment Details Patient Name: Date of Service: Joe Barry Joe Barry R. 02/26/2023 10:00 A M Medical Record Number: [id number] Patient Account Number: [id number] Date of Birth/Sex: Treating RN: [birth year] (89 y.o. Joe Barry Primary  Care Joe Barry: Joe Barry Other Clinician: Referring [name]: Treating Joe Barry/Extender: Joe Barry in Treatment: 0 Clinic Level of Care Assessment Items TOOL 1 Quantity Score []  - 0 Use when EandM and Procedure is performed on INITIAL visit ASSESSMENTS - Nursing Assessment / Reassessment X- 1 20 General Physical Exam (combine w/ comprehensive assessment (listed just below) when performed on new pt. evals) X- 1 25 Comprehensive Assessment (HX, ROS, Risk Assessments, Wounds Hx, etc.) ASSESSMENTS - Wound and Skin Assessment / Reassessment []  - 0 Dermatologic / Skin Assessment (not related to wound area) ASSESSMENTS - Ostomy and/or Continence Assessment and Care []  - 0 Incontinence Assessment and Management []  - 0 Ostomy Care Assessment and Management (repouching, etc.) PROCESS - Coordination of Care X - Simple Patient / Family Education for ongoing care 1 15 []  - 0 Complex (extensive) Patient / Family Education for ongoing care X- 1 10 Staff obtains Chiropractor, Records, T Results / Process Orders est []  - 0 Staff telephones HHA, Nursing Homes / Clarify orders / etc []  - 0 Routine Transfer to another Facility (non-emergent condition) []  - 0 Routine Hospital Admission (non-emergent condition) X- 1 15 New Admissions / Manufacturing engineer / Ordering NPWT Apligraf, etc. , []  - 0 Emergency Hospital Admission (emergent condition) PROCESS - Special Needs []  - 0 Pediatric / Minor Patient Management []  - 0 Isolation Patient Management []  - 0 Hearing / Language / Visual special needs []  - 0 Assessment of Community assistance (transportation, D/C planning, etc.) []  - 0 Additional assistance / Altered mentation []  - 0 Support Surface(s) Assessment (bed, cushion, seat, etc.) INTERVENTIONS - Miscellaneous []  - 0 External ear exam []  - 0 Patient Transfer (multiple staff / Nurse, adult / Similar devices) []  - 0 Simple Staple / Suture removal (25 or  less) []  - 0 Complex Staple / Suture removal (26 or more) []  - 0 Hypo/Hyperglycemic Management (do not check if billed separately) []  - 0 Ankle / Brachial Index (ABI) - do not check if billed separately [name], Joe Barry ([id number]) [id number] Page 3 of 10  Has the patient been seen at the hospital within the last three years: Yes Total Score: 85 Level Of Care: New/Established - Level 3 Electronic Signature(s) Signed: 02/26/2023 1:57:56 PM By: Joe Barry Entered By: Joe Barry on 02/26/2023 13:39:06 -------------------------------------------------------------------------------- Encounter Discharge Information Details Patient Name: Date of Service: Joe Barry [name] 02/26/2023 10:00 A M Medical Record Number: [id number] Patient Account Number: [id number] Date of Birth/Sex: Treating RN: September 21, 1933 (89 y.o. Joe Barry Primary Care Joe Barry: Joe Barry Other Clinician: Referring Joe Barry: Treating Joe Barry/Extender: Joe Barry in Treatment: 0 Encounter Discharge Information Items Post Procedure Vitals Discharge Condition: Stable Temperature (F): 97.6 Ambulatory Status: Wheelchair Pulse (bpm): 85 Discharge Destination: Home Respiratory Rate (breaths/min): 18 Transportation: Private Auto Blood Pressure (mmHg): 175/80 Accompanied By: daughter Schedule Follow-up Appointment: Yes Clinical Summary of Care: Electronic Signature(s) Signed: 02/26/2023 1:43:20 PM By: Joe Barry Entered By: Joe Barry on 02/26/2023 13:43:20 -------------------------------------------------------------------------------- Lower Extremity Assessment Details Patient Name: Date of Service: Joe Barry [facility] R. 02/26/2023 10:00 A M Medical Record Number: [id number] Patient Account Number: [id number] Date of Birth/Sex: Treating RN: [birth year] (89 y.o. Joe Barry Primary Care Joe Barry: Joe Barry Other Clinician: Referring  Laurice Iglesia: Treating Joe Barry/Extender: Joe Barry in Treatment: 0 Edema Assessment Assessed: [Left: No] [Right: No] Edema: [Left: Yes] [Right: Yes] Calf Left: Right: Point of Measurement: 30 cm From Medial Instep 52.5 cm 50 cm MICKAL, BUSSEN ([id number]) [id number] Page 4 of 10 Ankle Left: Right: Point of Measurement: 11 cm From Medial Instep 32.9 cm 33.9 cm Vascular Assessment Pulses: Dorsalis Pedis Doppler Audible: [Left:Yes] [Right:Yes] Posterior Tibial Doppler Audible: [Left:Yes] [Right:Yes] Notes pt unable to tolerate ABI test and requested I stop due to pain Electronic Signature(s) Signed: 02/26/2023 1:57:56 PM By: Joe Barry Entered By: Joe Barry on 02/26/2023 10:38:15 -------------------------------------------------------------------------------- Multi Wound Chart Details Patient Name: Date of Service: Joe Barry [name] 02/26/2023 10:00 A M Medical Record Number: [id number] Patient Account Number: [id number] Date of Birth/Sex: Treating RN: [birth year] (89 y.o. Joe Barry Primary Care Kanai Hilger: Joe Barry Other Clinician: Referring Maureena Dabbs: Treating Blayde Bacigalupi/Extender: Joe Barry in Treatment: 0 Vital Signs Height(in): 65 Pulse(bpm): 85 Weight(lbs): 180 Blood Pressure(mmHg): 175/80 Body Mass Index(BMI): 30 Temperature(F): 97.6 Respiratory Rate(breaths/min): 18 [1:Photos:] [N/A:N/A] Left, Proximal, Midline Lower Leg Left, Distal, Medial Lower Leg N/A Wound Location: Trauma Gradually Appeared N/A Wounding Event: Trauma, Other Lymphedema N/A Primary Etiology: Cataracts, Lymphedema, Cataracts, Lymphedema, N/A Comorbid History: Hypertension, Peripheral Venous Hypertension, Peripheral Venous Disease, Type II Diabetes Disease, Type II Diabetes 01/14/2023 01/28/2023 N/A Date Acquired: 0 0 N/A Weeks of Treatment: Open Open N/A Wound Status: No No N/A Wound  Recurrence: 5.5x2.5x0.3 5.5x3x0.1 N/A Measurements L x W x D (cm) 10.799 12.959 N/A A (cm) : rea 3.24 1.296 N/A Volume (cm) : Full Thickness Without Exposed Partial Thickness N/A Classification: MAKAIL, ESSA ([id number]) [id number] Page 5 of 10 Support Structures Medium Medium N/A Exudate A mount: Serosanguineous Serosanguineous N/A Exudate Type: red, brown red, brown N/A Exudate Color: Small (1-33%) Large (67-100%) N/A Granulation A mount: Pink Pink, Pale N/A Granulation Quality: Large (67-100%) Small (1-33%) N/A Necrotic A mount: Fat Layer (Subcutaneous Tissue): Yes N/A N/A Exposed Structures: None None N/A Epithelialization: Treatment Notes Electronic Signature(s) Signed: 02/26/2023 1:57:56 PM By: Joe Barry Entered By: Joe Barry on 02/26/2023 11:05:24 -------------------------------------------------------------------------------- Multi-Disciplinary Care Plan Details Patient Name: Date of Service: Joe Barry [name] 02/26/2023 10:00 A M Medical Record Number: [id number] Patient Account  Number: [id number] Date of Birth/Sex: Treating RN: [birth year] (89 y.o. Joe Barry Primary Care Coretta Leisey: Joe Barry Other Clinician: Referring Harout Scheurich: Treating Alaina Donati/Extender: Joe Barry in Treatment: 0 Active Inactive Orientation to the Wound Care Program Nursing Diagnoses: Knowledge deficit related to the wound healing Barry program Goals: Patient/caregiver will verbalize understanding of the Wound Healing Barry Program Date Initiated: 02/26/2023 Target Resolution Date: 03/05/2023 Goal Status: Active Interventions: Provide education on orientation to the wound Barry Notes: Venous Leg Ulcer Nursing Diagnoses: Knowledge deficit related to disease process and management Potential for venous Insuffiency (use before diagnosis confirmed) Goals: Patient will maintain optimal edema control Date  Initiated: 02/26/2023 Target Resolution Date: 04/23/2023 Goal Status: Active Patient/caregiver will verbalize understanding of disease process and disease management Date Initiated: 02/26/2023 Date Inactivated: 02/26/2023 Target Resolution Date: 02/26/2023 Goal Status: Met Interventions: Assess peripheral edema status every visit. Compression as ordered LAD, PLANE ([id number]) [id number] Page 6 of 10 Treatment Activities: Therapeutic compression applied : 02/26/2023 Notes: Wound/Skin Impairment Nursing Diagnoses: Impaired tissue integrity Knowledge deficit related to ulceration/compromised skin integrity Goals: Ulcer/skin breakdown will have a volume reduction of 30% by week 4 Date Initiated: 02/26/2023 Target Resolution Date: 03/26/2023 Goal Status: Active Ulcer/skin breakdown will have a volume reduction of 50% by week 8 Date Initiated: 02/26/2023 Target Resolution Date: 04/23/2023 Goal Status: Active Ulcer/skin breakdown will have a volume reduction of 80% by week 12 Date Initiated: 02/26/2023 Target Resolution Date: 05/21/2023 Goal Status: Active Ulcer/skin breakdown will heal within 14 weeks Date Initiated: 02/26/2023 Target Resolution Date: 06/04/2023 Goal Status: Active Interventions: Assess patient/caregiver ability to obtain necessary supplies Assess patient/caregiver ability to perform ulcer/skin care regimen upon admission and as needed Assess ulceration(s) every visit Provide education on ulcer and skin care Treatment Activities: Skin care regimen initiated : 02/26/2023 Notes: Electronic Signature(s) Signed: 02/26/2023 1:42:06 PM By: Joe Barry Entered By: Joe Barry on 02/26/2023 13:42:06 -------------------------------------------------------------------------------- Pain Assessment Details Patient Name: Date of Service: Joe Barry [name] 02/26/2023 10:00 A M Medical Record Number: [id number] Patient Account Number: [id number] Date  of Birth/Sex: Treating RN: 01-Dec-1933 (89 y.o. Joe Barry Primary Care Kimarie Coor: Joe Barry Other Clinician: Referring Tila Millirons: Treating Marsa Matteo/Extender: Joe Barry in Treatment: 0 Active Problems Location of Pain Severity and Description of Pain Patient Has Paino No Site Locations Rate the pain. CAROLINE, NORMILE ([id number]) [id number] Page 7 of 10 Rate the pain. Current Pain Level: 0 Pain Management and Medication Current Pain Management: Electronic Signature(s) Signed: 02/26/2023 1:57:56 PM By: Joe Barry Entered By: Joe Barry on 02/26/2023 10:17:44 -------------------------------------------------------------------------------- Patient/Caregiver Education Details Patient Name: Date of Service: Delynn Flavin R. 6/14/2024andnbsp10:00 A M Medical Record Number: [id number] Patient Account Number: [id number] Date of Birth/Gender: Treating RN: 1934-02-06 (89 y.o. Joe Barry Primary Care Physician: Joe Barry Other Clinician: Referring Physician: Treating Physician/Extender: Joe Barry in Treatment: 0 Education Assessment Education Provided To: Patient and Caregiver Education Topics Provided Venous: Handouts: Controlling Swelling with Compression Stockings Methods: Explain/Verbal Responses: State content correctly Wound Debridement: Handouts: Wound Debridement Methods: Explain/Verbal Responses: State content correctly Wound/Skin Impairment: Handouts: Caring for Your Ulcer Methods: Explain/Verbal Responses: State content correctly Electronic Signature(s) Signed: 02/26/2023 1:57:56 PM By: Cletus Gash, Lennis R6/14/2024 1:57:56 PM By: Joe Barry Signed: ([id number]) [id number] Page 8 of 10 Entered By: Joe Barry on 02/26/2023 13:42:26 -------------------------------------------------------------------------------- Wound  Assessment Details Patient Name: Date of Service: [facility] R. 02/26/2023 10:00 A M Medical Record Number:  [id number] Patient Account Number: [id number] Date of Birth/Sex: Treating RN: 1934-08-10 (89 y.o. Joe Barry Primary Care Siana Panameno: Joe Barry Other Clinician: Referring Ariany Kesselman: Treating Joley Utecht/Extender: Joe Barry in Treatment: 0 Wound Status Wound Number: 1 Primary Trauma, Other Etiology: Wound Location: Left, Proximal, Midline Lower Leg Wound Open Wounding Event: Trauma Status: Date Acquired: 01/14/2023 Comorbid Cataracts, Lymphedema, Hypertension, Peripheral Venous Weeks Of Treatment: 0 History: Disease, Type II Diabetes Clustered Wound: No Photos Wound Measurements Length: (cm) 5.5 Width: (cm) 2.5 Depth: (cm) 0.3 Area: (cm) 10.799 Volume: (cm) 3.24 % Reduction in Area: % Reduction in Volume: Epithelialization: None Tunneling: No Undermining: No Wound Description Classification: Full Thickness Without Exposed Suppo Exudate Amount: Medium Exudate Type: Serosanguineous Exudate Color: red, brown rt Structures Foul Odor After Cleansing: No Slough/Fibrino Yes Wound Bed Granulation Amount: Small (1-33%) Exposed Structure Granulation Quality: Pink Fat Layer (Subcutaneous Tissue) Exposed: Yes Necrotic Amount: Large (67-100%) Electronic Signature(s) Signed: 02/26/2023 1:57:56 PM By: Joe Barry Entered By: Joe Barry on 02/26/2023 10:47:10 [name] ([id number]) [id number] Page 9 of 10 -------------------------------------------------------------------------------- Wound Assessment Details Patient Name: Date of Service: Joe Barry [facility] R. 02/26/2023 10:00 A M Medical Record Number: [id number] Patient Account Number: [id number] Date of Birth/Sex: Treating RN: Jun 17, 1934 (89 y.o. Joe Barry Primary Care Emilio Baylock: Joe Barry Other Clinician: Referring Siraj Dermody: Treating  Rushton Early/Extender: Joe Barry in Treatment: 0 Wound Status Wound Number: 2 Primary Lymphedema Etiology: Wound Location: Left, Distal, Medial Lower Leg Wound Open Wounding Event: Gradually Appeared Status: Date Acquired: 01/28/2023 Comorbid Cataracts, Lymphedema, Hypertension, Peripheral Venous Weeks Of Treatment: 0 History: Disease, Type II Diabetes Clustered Wound: No Photos Wound Measurements Length: (cm) 5.5 Width: (cm) 3 Depth: (cm) 0.1 Area: (cm) 12.959 Volume: (cm) 1.296 % Reduction in Area: % Reduction in Volume: Epithelialization: None Tunneling: No Undermining: No Wound Description Classification: Partial Thickness Exudate Amount: Medium Exudate Type: Serosanguineous Exudate Color: red, brown Foul Odor After Cleansing: No Slough/Fibrino Yes Wound Bed Granulation Amount: Large (67-100%) Granulation Quality: Pink, Pale Necrotic Amount: Small (1-33%) Necrotic Quality: Adherent Slough Treatment Notes Wound #2 (Lower Leg) Wound Laterality: Left, Medial, Distal Cleanser Soap and Water Discharge Instruction: Gently cleanse wound with antibacterial soap, rinse and pat dry prior to dressing wounds Peri-Wound Care Topical YUREN, EBERTS ([id number]) [id number] Page 10 of 10 Primary Dressing Silvercel Small 2x2 (in/in) Discharge Instruction: Apply Silvercel Small 2x2 (in/in) as instructed Secondary Dressing Zetuvit Plus 4x8 (in/in) Secured With Compression Wrap Urgo K2 Lite, two layer compression system, large Compression Stockings Add-Ons Electronic Signature(s) Signed: 02/26/2023 1:57:56 PM By: Joe Barry Entered By: Joe Barry on 02/26/2023 10:48:38 -------------------------------------------------------------------------------- Vitals Details Patient Name: Date of Service: Joe Barry [name] 02/26/2023 10:00 A M Medical Record Number: [id number] Patient Account Number: [id number] Date of  Birth/Sex: Treating RN: 08-13-1934 (89 y.o. Joe Barry Primary Care Luma Clopper: Joe Barry Other Clinician: Referring Malary Aylesworth: Treating Akeyla Molden/Extender: Joe Barry in Treatment: 0 Vital Signs Time Taken: 10:18 Temperature (F): 97.6 Height (in): 65 Pulse (bpm): 85 Source: Stated Respiratory Rate (breaths/min): 18 Weight (lbs): 180 Blood Pressure (mmHg): 175/80 Source: Stated Reference Range: 80 - 120 mg / dl Body Mass Index (BMI): 30 Electronic Signature(s) Signed: 02/26/2023 1:57:56 PM By: Joe Barry Entered By: Joe Barry on 02/26/2023 10:19:26

## 2023-03-02 DIAGNOSIS — L97922 Non-pressure chronic ulcer of unspecified part of left lower leg with fat layer exposed: Secondary | ICD-10-CM | POA: Diagnosis not present

## 2023-03-02 DIAGNOSIS — E11622 Type 2 diabetes mellitus with other skin ulcer: Secondary | ICD-10-CM | POA: Diagnosis not present

## 2023-03-02 DIAGNOSIS — I890 Lymphedema, not elsewhere classified: Secondary | ICD-10-CM | POA: Diagnosis not present

## 2023-03-02 DIAGNOSIS — Z7984 Long term (current) use of oral hypoglycemic drugs: Secondary | ICD-10-CM | POA: Diagnosis not present

## 2023-03-02 DIAGNOSIS — I10 Essential (primary) hypertension: Secondary | ICD-10-CM | POA: Diagnosis not present

## 2023-03-02 DIAGNOSIS — E1151 Type 2 diabetes mellitus with diabetic peripheral angiopathy without gangrene: Secondary | ICD-10-CM | POA: Diagnosis not present

## 2023-03-02 DIAGNOSIS — L97822 Non-pressure chronic ulcer of other part of left lower leg with fat layer exposed: Secondary | ICD-10-CM | POA: Diagnosis not present

## 2023-03-02 DIAGNOSIS — S81812A Laceration without foreign body, left lower leg, initial encounter: Secondary | ICD-10-CM | POA: Diagnosis not present

## 2023-03-03 ENCOUNTER — Telehealth: Payer: Self-pay | Admitting: Family Medicine

## 2023-03-04 NOTE — Progress Notes (Signed)
SCOTTI, LARACUENTE ([id number]) [id number] Page 1 of 5 Visit Report for 03/02/2023 Arrival Information Details Patient Name: Date of Service: Joe Barry [facility] R. 03/02/2023 11:00 A M Medical Record Number: [id number] Patient Account Number: [id number] Date of Birth/Sex: Treating RN: [birth year] (89 y.o. Joe Barry Primary Care Cameran Ahmed: [name] Other Clinician: Referring Lukisha Procida: Treating Laporsche Hoeger/Extender: Kallie Edward in Treatment: 0 Visit Information History Since Last Visit Added or deleted any medications: No Patient Arrived: Wheel Chair Has Dressing in Place as Prescribed: Yes Arrival Time: 11:10 Has Compression in Place as Prescribed: Yes Accompanied By: daughter Pain Present Now: No Transfer Assistance: EasyPivot Patient Lift Patient Identification Verified: Yes Secondary Verification Process Completed: Yes Patient Requires Transmission-Based Precautions: No Patient Has Alerts: No Electronic Signature(s) Signed: 03/04/2023 4:40:27 PM By: Midge Aver MSN RN CNS WTA Entered By: Midge Aver on 03/02/2023 11:20:32 -------------------------------------------------------------------------------- Clinic Level of Care Assessment Details Patient Name: Date of Service: Joe Barry [facility] R. 03/02/2023 11:00 A M Medical Record Number: [id number] Patient Account Number: [id number] Date of Birth/Sex: Treating RN: 1933-12-24 (89 y.o. Joe Barry Primary Care Aidee Latimore: [name] Other Clinician: Referring Garan Frappier: Treating Benett Swoyer/Extender: Kallie Edward in Treatment: 0 Clinic Level of Care Assessment Items TOOL 1 Quantity Score []  - 0 Use when EandM and Procedure is performed on INITIAL visit ASSESSMENTS - Nursing Assessment / Reassessment []  - 0 General Physical Exam (combine w/ comprehensive assessment (listed just below) when performed on new pt. evals) []  - 0 Comprehensive Assessment (HX, ROS, Risk  Assessments, Wounds Hx, etc.) ASSESSMENTS - Wound and Skin Assessment / Reassessment []  - 0 Dermatologic / Skin Assessment (not related to wound area) ASSESSMENTS - Ostomy and/or Continence Assessment and Care JACIEON, SHORTINO ([id number]) [id number] Page 2 of 5 []  - 0 Incontinence Assessment and Management []  - 0 Ostomy Care Assessment and Management (repouching, etc.) PROCESS - Coordination of Care []  - 0 Simple Patient / Family Education for ongoing care []  - 0 Complex (extensive) Patient / Family Education for ongoing care []  - 0 Staff obtains Chiropractor, Records, T Results / Process Orders est []  - 0 Staff telephones HHA, Nursing Homes / Clarify orders / etc []  - 0 Routine Transfer to another Facility (non-emergent condition) []  - 0 Routine Hospital Admission (non-emergent condition) []  - 0 New Admissions / Manufacturing engineer / Ordering NPWT Apligraf, etc. , []  - 0 Emergency Hospital Admission (emergent condition) PROCESS - Special Needs []  - 0 Pediatric / Minor Patient Management []  - 0 Isolation Patient Management []  - 0 Hearing / Language / Visual special needs []  - 0 Assessment of Community assistance (transportation, D/C planning, etc.) []  - 0 Additional assistance / Altered mentation []  - 0 Support Surface(s) Assessment (bed, cushion, seat, etc.) INTERVENTIONS - Miscellaneous []  - 0 External ear exam []  - 0 Patient Transfer (multiple staff / Nurse, adult / Similar devices) []  - 0 Simple Staple / Suture removal (25 or less) []  - 0 Complex Staple / Suture removal (26 or more) []  - 0 Hypo/Hyperglycemic Management (do not check if billed separately) []  - 0 Ankle / Brachial Index (ABI) - do not check if billed separately Has the patient been seen at the hospital within the last three years: Yes Total Score: 0 Level Of Care: ____ Electronic Signature(s) Signed: 03/04/2023 4:40:27 PM By: Midge Aver MSN RN CNS WTA Entered By:  Midge Aver on 03/02/2023 11:26:17 -------------------------------------------------------------------------------- Compression Therapy Details Patient Name: Date of Service: TA  [name] 03/02/2023 11:00 A M Medical Record Number: [id number] Patient Account Number: [id number] Date of Birth/Sex: Treating RN: 08-27-1934 (89 y.o. Joe Barry Primary Care Davonn Flanery: [name] Other Clinician: Referring Ernesto Zukowski: Treating Jawana Reagor/Extender: Kallie Edward in Treatment: 0 Compression Therapy Performed for Wound Assessment: Wound #1 Left,Proximal,Midline Lower Leg Performed By: Clinician Midge Aver, RN Compression Type: [address] FLYNN, FREZZA ([id number]) [id number] Page 3 of 5 Electronic Signature(s) Signed: 03/02/2023 11:21:10 AM By: Midge Aver MSN RN CNS WTA Entered By: Midge Aver on 03/02/2023 11:21:10 -------------------------------------------------------------------------------- Compression Therapy Details Patient Name: Date of Service: Joe Barry [name] 03/02/2023 11:00 A M Medical Record Number: [id number] Patient Account Number: [id number] Date of Birth/Sex: Treating RN: [birth year] (89 y.o. Joe Barry Primary Care Nishka Heide: [name] Other Clinician: Referring Deloy Archey: Treating Nobel Brar/Extender: Kallie Edward in Treatment: 0 Compression Therapy Performed for Wound Assessment: Wound #2 Left,Distal,Medial Lower Leg Performed By: Clinician Midge Aver, RN Compression Type: Three Emergency planning/management officer) Signed: 03/02/2023 11:22:10 AM By: Midge Aver MSN RN CNS WTA Entered By: Midge Aver on 03/02/2023 11:22:10 -------------------------------------------------------------------------------- Encounter Discharge Information Details Patient Name: Date of Service: Joe Barry [name] 03/02/2023 11:00 A M Medical Record Number: [id number] Patient Account Number: [id number] Date of  Birth/Sex: Treating RN: [birth year] (89 y.o. Joe Barry Primary Care Deen Deguia: [name] Other Clinician: Referring Geri Hepler: Treating Samanvitha Germany/Extender: Kallie Edward in Treatment: 0 Encounter Discharge Information Items Discharge Condition: Stable Ambulatory Status: Wheelchair Discharge Destination: Home Transportation: Private Auto Accompanied By: daughter Schedule Follow-up Appointment: Yes Clinical Summary of Care: Electronic Signature(s) Signed: 03/04/2023 4:40:27 PM By: Midge Aver MSN RN CNS WTA Previous Signature: 03/02/2023 11:23:31 AM Version By: Midge Aver MSN RN CNS WTA Entered By: Midge Aver on 03/02/2023 11:26:05 TAMEKA, FRANKFORT ([id number]) [id number] Page 4 of 5 -------------------------------------------------------------------------------- Wound Assessment Details Patient Name: Date of Service: Joe Barry [facility] R. 03/02/2023 11:00 A M Medical Record Number: [id number] Patient Account Number: [id number] Date of Birth/Sex: Treating RN: 04/23/1934 (89 y.o. Joe Barry Primary Care Luwana Butrick: [name] Other Clinician: Referring Emmalia Heyboer: Treating Myking Sar/Extender: Kallie Edward in Treatment: 0 Wound Status Wound Number: 1 Primary Etiology: Trauma, Other Wound Location: Left, Proximal, Midline Lower Leg Wound Status: Open Wounding Event: Trauma Date Acquired: 01/14/2023 Weeks Of Treatment: 0 Clustered Wound: No Wound Measurements Length: (cm) 5.5 Width: (cm) 2.5 Depth: (cm) 0.3 Area: (cm) 10.799 Volume: (cm) 3.24 % Reduction in Area: 0% % Reduction in Volume: 0% Wound Description Classification: Full Thickness Without Exposed Support Exudate Amount: Medium Exudate Type: Serosanguineous Exudate Color: red, brown Structures Treatment Notes Wound #1 (Lower Leg) Wound Laterality: Left, Midline, Proximal Cleanser Soap and Water Discharge Instruction: Gently cleanse wound with  antibacterial soap, rinse and pat dry prior to dressing wounds Peri-Wound Care Topical Primary Dressing Silvercel Small 2x2 (in/in) Discharge Instruction: Apply Silvercel Small 2x2 (in/in) as instructed Secondary Dressing Zetuvit Plus 4x8 (in/in) Secured With Compression Wrap Urgo K2 Lite, two layer compression system, large Compression Stockings Add-Ons Electronic Signature(s) Signed: 03/04/2023 4:40:27 PM By: Midge Aver MSN RN CNS WTA Entered By: Midge Aver on 03/02/2023 11:20:51 [name] ([id number]) [phone].pdf Page 5 of 5 -------------------------------------------------------------------------------- Wound Assessment Details Patient Name: Date of Service: Joe Barry [facility] R. 03/02/2023 11:00 A M Medical Record Number: [id number] Patient Account Number: [id number] Date of Birth/Sex: Treating RN: 04-Oct-1933 (89 y.o. Joe Barry Primary Care Alylah Blakney: Susann Givens,  Taryn Other Clinician: Referring Chidubem Chaires: Treating Freddie Nghiem/Extender: Kallie Edward in Treatment: 0 Wound Status Wound Number: 2 Primary Etiology: Lymphedema Wound Location: Left, Distal, Medial Lower Leg Wound Status: Open Wounding Event: Gradually Appeared Date Acquired: 01/28/2023 Weeks Of Treatment: 0 Clustered Wound: No Wound Measurements Length: (cm) 5.5 Width: (cm) 3 Depth: (cm) 0.1 Area: (cm) 12.959 Volume: (cm) 1.296 % Reduction in Area: 0% % Reduction in Volume: 0% Wound Description Classification: Partial Thickness Exudate Amount: Medium Exudate Type: Serosanguineous Exudate Color: red, brown Treatment Notes Wound #2 (Lower Leg) Wound Laterality: Left, Medial, Distal Cleanser Soap and Water Discharge Instruction: Gently cleanse wound with antibacterial soap, rinse and pat dry prior to dressing wounds Peri-Wound Care Topical Primary Dressing Silvercel Small 2x2 (in/in) Discharge Instruction: Apply Silvercel Small 2x2 (in/in) as  instructed Secondary Dressing Zetuvit Plus 4x8 (in/in) Secured With Compression Wrap Urgo K2 Lite, two layer compression system, large Compression Stockings Add-Ons Electronic Signature(s) Signed: 03/04/2023 4:40:27 PM By: Midge Aver MSN RN CNS WTA Entered By: Midge Aver on 03/02/2023 11:20:51

## 2023-03-05 NOTE — Telephone Encounter (Signed)
Error

## 2023-03-05 NOTE — Progress Notes (Signed)
KALUB, MORILLO ([id number]) [id number] Page 1 of 2 Visit Report for 03/02/2023 Physician Orders Details Patient Name: Date of Service: Joe Barry [facility] R. 03/02/2023 11:00 A M Medical Record Number: [id number] Patient Account Number: [id number] Date of Birth/Sex: Treating RN: [birth year] (89 y.o. [name] Primary Care Provider: [name] Other Clinician: Referring Provider: Treating Provider/Extender: Kallie Edward in Treatment: 0 Verbal / Phone Orders: No Diagnosis Coding Follow-up Appointments Return Appointment in 1 week. Nurse Visit as needed - to check wound and re-wrap left lower leg Bathing/ Shower/ Hygiene May shower with wound dressing protected with water repellent cover or cast protector. - pt advised to not get wrap wet No tub bath. Anesthetic (Use 'Patient Medications' Section for Anesthetic Order Entry) Lidocaine applied to wound bed Edema Control - Lymphedema / Segmental Compressive Device / Other UrgoK2 LITE - size large used Elevate, Exercise Daily and A void Standing for Long Periods of Time. Elevate leg(s) parallel to the floor when sitting. DO YOUR BEST to sleep in the bed at night. DO NOT sleep in your recliner. Long hours of sitting in a recliner leads to swelling of the legs and/or potential wounds on your backside. Other: - discussed juxtalite for legs in future once swelling is down Medications-Please add to medication list. ntibiotics - continue taking previously prescribed oral antibiotics P.O. A Wound Treatment Wound #1 - Lower Leg Wound Laterality: Left, Midline, Proximal Cleanser: Soap and Water 2 x Per Week/30 Days Discharge Instructions: Gently cleanse wound with antibacterial soap, rinse and pat dry prior to dressing wounds Prim Dressing: Silvercel Small 2x2 (in/in) 2 x Per Week/30 Days ary Discharge Instructions: Apply Silvercel Small 2x2 (in/in) as instructed Secondary Dressing:  Zetuvit Plus 4x8 (in/in) 2 x Per Week/30 Days Compression Wrap: Urgo K2 Lite, two layer compression system, large 2 x Per Week/30 Days Wound #2 - Lower Leg Wound Laterality: Left, Medial, Distal Cleanser: Soap and Water 2 x Per Week/30 Days Discharge Instructions: Gently cleanse wound with antibacterial soap, rinse and pat dry prior to dressing wounds Prim Dressing: Silvercel Small 2x2 (in/in) 2 x Per Week/30 Days ary Discharge Instructions: Apply Silvercel Small 2x2 (in/in) as instructed Secondary Dressing: Zetuvit Plus 4x8 (in/in) 2 x Per Week/30 Days Compression Wrap: Urgo K2 Lite, two layer compression system, large 2 x Per Week/30 Days Electronic Signature(s) Signed: 03/02/2023 11:22:48 AM By: Midge Aver MSN RN CNS [address] [place] ([id number]) [phone].pdf Page 2 of 2 Signed: 03/04/2023 5:52:14 PM By: [name] PA-C Entered By: Midge Aver on 03/02/2023 11:22:48 -------------------------------------------------------------------------------- SuperBill Details Patient Name: Date of Service: Joe Barry [name] 03/02/2023 Medical Record Number: [id number] Patient Account Number: [id number] Date of Birth/Sex: Treating RN: August 06, 1934 (89 y.o. [name] Primary Care Provider: [name] Other Clinician: Referring Provider: Treating Provider/Extender: Kallie Edward in Treatment: 0 Diagnosis Coding ICD-10 Codes Code Description [phone] Laceration without foreign body, left lower leg, initial encounter L97.822 Non-pressure chronic ulcer of other part of left lower leg with fat layer exposed I89.0 Lymphedema, not elsewhere classified E11.622 Type 2 diabetes mellitus with other skin ulcer Z79.84 Long term (current) use of oral hypoglycemic drugs I10 Essential (primary) hypertension I73.89 Other specified peripheral vascular diseases Facility Procedures : CPT4 Code: 63016010 Description: (Facility Use Only) [phone] - APPLY  MULTLAY COMPRS LWR LT LEG Modifier: Quantity: 1 Electronic Signature(s) Signed: 03/02/2023 11:26:24 AM By: Midge Aver MSN RN CNS WTA Signed: 03/04/2023 5:52:14 PM By: [name] PA-C  Previous Signature: 03/02/2023 11:23:48 AM Version By: Midge Aver MSN RN CNS WTA Entered By: Midge Aver on 03/02/2023 11:26:24

## 2023-03-07 NOTE — Progress Notes (Signed)
Please call his daughter and him.  Thankfully the x-ray did not show any signs of infection to the bone.  I did not get the results right away and I went on vacation last week so I am unfortunately not seeing the results till after the fact.  I did review the wound clinic notes and I am glad he got in so quickly.

## 2023-03-09 ENCOUNTER — Encounter: Payer: Medicare PPO | Admitting: Physician Assistant

## 2023-03-09 DIAGNOSIS — L97922 Non-pressure chronic ulcer of unspecified part of left lower leg with fat layer exposed: Secondary | ICD-10-CM | POA: Diagnosis not present

## 2023-03-09 DIAGNOSIS — S81802A Unspecified open wound, left lower leg, initial encounter: Secondary | ICD-10-CM | POA: Diagnosis not present

## 2023-03-09 DIAGNOSIS — I890 Lymphedema, not elsewhere classified: Secondary | ICD-10-CM | POA: Diagnosis not present

## 2023-03-09 DIAGNOSIS — E11622 Type 2 diabetes mellitus with other skin ulcer: Secondary | ICD-10-CM | POA: Diagnosis not present

## 2023-03-09 DIAGNOSIS — E1151 Type 2 diabetes mellitus with diabetic peripheral angiopathy without gangrene: Secondary | ICD-10-CM | POA: Diagnosis not present

## 2023-03-09 DIAGNOSIS — S81812A Laceration without foreign body, left lower leg, initial encounter: Secondary | ICD-10-CM | POA: Diagnosis not present

## 2023-03-09 DIAGNOSIS — L97822 Non-pressure chronic ulcer of other part of left lower leg with fat layer exposed: Secondary | ICD-10-CM | POA: Diagnosis not present

## 2023-03-09 DIAGNOSIS — I10 Essential (primary) hypertension: Secondary | ICD-10-CM | POA: Diagnosis not present

## 2023-03-09 DIAGNOSIS — Z7984 Long term (current) use of oral hypoglycemic drugs: Secondary | ICD-10-CM | POA: Diagnosis not present

## 2023-03-09 NOTE — Progress Notes (Addendum)
ROOK, MAUE ([id number]) [id number] Page 1 of 8 Visit Report for 03/09/2023 Chief Complaint Document Details Patient Name: Date of Service: Joe Artis [facility] R. 03/09/2023 10:30 A M Medical Record Number: [id number] Patient Account Number: [id number] Date of Birth/Sex: Treating RN: 1933-12-27 (89 y.o. Joe Barry: Joe Barry Other Clinician: Referring Barry: Treating Barry/Extender: Joe Barry in Treatment: 1 Information Obtained from: Patient Chief Complaint Left LE Ulcers Electronic Signature(s) Signed: 03/09/2023 11:01:10 AM By: Joe Derry PA-C Entered By: Joe Barry on 03/09/2023 11:01:09 -------------------------------------------------------------------------------- Debridement Details Patient Name: Date of Service: Joe Artis [name] 03/09/2023 10:30 A M Medical Record Number: [id number] Patient Account Number: [id number] Date of Birth/Sex: Treating RN: Nov 30, 1933 (89 y.o. Joe Barry: Joe Barry Other Clinician: Referring Barry: Treating Barry/Extender: Joe Barry in Treatment: 1 Debridement Performed for Assessment: Wound #1 Left,Proximal,Midline Lower Leg Performed By: Physician Joe Derry, PA-C Debridement Type: Debridement Level of Consciousness (Pre-procedure): Awake and Alert Pre-procedure Verification/Time Out Yes - 11:39 Taken: Pain Control: Lidocaine 4% T opical Solution Percent of Wound Bed Debrided: 100% T Area Debrided (cm): otal 4.16 Tissue and other material debrided: Viable, Non-Viable, Slough, Subcutaneous, Slough Level: Skin/Subcutaneous Tissue Debridement Description: Excisional Instrument: Curette Bleeding: Moderate Hemostasis Achieved: Pressure Response to Treatment: Procedure was tolerated well Level of Consciousness (Post- Awake and Alert procedure): Barry, Joe ([id number])  [phone].pdf Page 2 of 8 Post Debridement Measurements of Total Wound Length: (cm) 5.3 Width: (cm) 1 Depth: (cm) 0.3 Volume: (cm) 1.249 Character of Wound/Ulcer Post Debridement: Stable Post Procedure Diagnosis Same as Pre-procedure Electronic Signature(s) Signed: 03/09/2023 4:12:56 PM By: Joe Barry Signed: 03/09/2023 5:11:00 PM By: Joe Derry PA-C Entered By: Joe Barry on 03/09/2023 11:39:49 -------------------------------------------------------------------------------- HPI Details Patient Name: Date of Service: Joe Artis [name] 03/09/2023 10:30 A M Medical Record Number: [id number] Patient Account Number: [id number] Date of Birth/Sex: Treating RN: [birth year] (89 y.o. Joe Barry: Joe Barry Other Clinician: Referring Barry: Treating Barry/Extender: Joe Barry in Treatment: 1 History of Present Illness HPI Description: 02-26-2023 upon evaluation today patient presents for initial inspection here in our clinic concerning issues that he has been having with a wound on his left anterior lower leg. This actually began on 01-14-2023 when the patient was getting out of the car and subsequently they were turning the wheelchair around to move around the door and got too close to the edge of the door and the corner at the bottom actually cut his leg. This because of his lymphedema has been a very hard wound to heal following. Fortunately there does not appear to be any signs of infection systemically which is good news. With that being said even locally I do not see any signs of infection at this point. I do believe that he is making fairly good progress all things considered although I think that we definitely need to clean some this out and get it moving in the right direction. Patient has been using Medihoney or rather his daughter has at home she is a Engineer, civil (consulting) she works in the ICU office seen her  previously as well as a patient. Subsequently after it was apparent that this was not getting better they did go on 7 June to [place] [facility] and at that point they actually put the patient on Augmentin which she started on 02-19-2023 and is still continuing that through current. His most  recent hemoglobin A1c was 6.9 on 01-16-2023. He did have an x-ray done of the leg but this has not been released yet it still pending a reading. Currently could not tolerate the ABI screening but he seems to have good perfusion in regard to his leg I do not know that we go to gotten a lot of a good reading anyway when it comes to the ABIs even if we were able to do the screening due to the amount of lymphedema that he has with associated swelling. Patient does have a history of lymphedema, diabetes mellitus type 2, he is on oral hypoglycemic agents. He also has a history of hypertension and peripheral vascular disease although his last ABI check in 2018 was normal according to his daughter and he has seen vascular again at that time but has not had any recent ABIs performed. 03-09-2023 upon evaluation today patient appears to be doing well currently in regard to his wounds. He has been tolerating the dressing changes without complication. Fortunately I see signs of definite improvement and very pleased in this regard. Electronic Signature(s) Signed: 03/09/2023 12:42:46 PM By: Joe Derry PA-C Entered By: Joe Barry on 03/09/2023 12:42:46 Joe Barry, Joe Barry ([id number]) [id number] Page 3 of 8 -------------------------------------------------------------------------------- Physical Exam Details Patient Name: Date of Service: Joe Artis [place] R. 03/09/2023 10:30 A M Medical Record Number: [id number] Patient Account Number: [id number] Date of Birth/Sex: Treating RN: 08-17-1934 (89 y.o. Joe Barry: Joe Barry Other Clinician: Referring Barry: Treating  Barry/Extender: Joe Barry in Treatment: 1 Constitutional Well-nourished and well-hydrated in no acute distress. Respiratory normal breathing without difficulty. Psychiatric this patient is able to make decisions and demonstrates good insight into disease process. Alert and Oriented x 3. pleasant and cooperative. Notes Upon inspection patient's wound again did require sharp debridement in regard to the main wound is secondary wound is actually completely healed with down to just 1 at this point which is great news and very pleased with where we stand. Electronic Signature(s) Signed: 03/09/2023 12:43:01 PM By: Joe Derry PA-C Entered By: Joe Barry on 03/09/2023 12:43:01 -------------------------------------------------------------------------------- Physician Orders Details Patient Name: Date of Service: Joe Artis [name] 03/09/2023 10:30 A M Medical Record Number: [id number] Patient Account Number: [id number] Date of Birth/Sex: Treating RN: 07-Jun-1934 (89 y.o. Joe Barry: Joe Barry Other Clinician: Referring Barry: Treating Barry/Extender: Joe Barry in Treatment: 1 Verbal / Phone Orders: No Diagnosis Coding ICD-10 Coding Code Description [phone] Laceration without foreign body, left lower leg, initial encounter L97.822 Non-pressure chronic ulcer of other part of left lower leg with fat layer exposed I89.0 Lymphedema, not elsewhere classified E11.622 Type 2 diabetes mellitus with other skin ulcer Z79.84 Long term (current) use of oral hypoglycemic drugs I10 Essential (primary) hypertension I73.89 Other specified peripheral vascular diseases Joe Barry, Joe Barry ([id number]) [id number] Page 4 of 8 Follow-up Appointments Return Appointment in 1 week. Nurse Visit as needed - to check wound and re-wrap left lower leg Bathing/ Shower/ Hygiene May shower with wound dressing  protected with water repellent cover or cast protector. - pt advised to not get wrap wet No tub bath. Anesthetic (Use 'Patient Medications' Section for Anesthetic Order Entry) Lidocaine applied to wound bed Edema Control - Lymphedema / Segmental Compressive Device / Other UrgoK2 LITE - size large used Tubigrip double layer applied - RLE size F used Elevate, Exercise Daily and A void Standing for Long Periods of Time.  Elevate leg(s) parallel to the floor when sitting. DO YOUR BEST to sleep in the bed at night. DO NOT sleep in your recliner. Long hours of sitting in a recliner leads to swelling of the legs and/or potential wounds on your backside. Other: - discussed juxtalite for legs in future once swelling is down Medications-Please add to medication list. ntibiotics - continue taking previously prescribed oral antibiotics P.O. A Wound Treatment Wound #1 - Lower Leg Wound Laterality: Left, Midline, Proximal Cleanser: Soap and Water 2 x Per Week/30 Days Discharge Instructions: Gently cleanse wound with antibacterial soap, rinse and pat dry prior to dressing wounds Prim Dressing: Silvercel Small 2x2 (in/in) 2 x Per Week/30 Days ary Discharge Instructions: Apply Silvercel Small 2x2 (in/in) as instructed Secondary Dressing: Zetuvit Plus 4x8 (in/in) 2 x Per Week/30 Days Compression Wrap: Urgo K2 Lite, two layer compression system, regular 2 x Per Week/30 Days Electronic Signature(s) Signed: 03/09/2023 4:12:56 PM By: Joe Barry Signed: 03/09/2023 5:11:00 PM By: Joe Derry PA-C Entered By: Joe Barry on 03/09/2023 11:55:36 -------------------------------------------------------------------------------- Problem List Details Patient Name: Date of Service: Joe Artis [name] 03/09/2023 10:30 A M Medical Record Number: [id number] Patient Account Number: [id number] Date of Birth/Sex: Treating RN: [birth year] (89 y.o. Joe Barry: Joe Barry Other  Clinician: Referring Barry: Treating Barry/Extender: Joe Barry in Treatment: 1 Active Problems ICD-10 Encounter Code Description Active Date MDM Diagnosis [phone] Laceration without foreign body, left lower leg, initial encounter 02/26/2023 No Yes L97.822 Non-pressure chronic ulcer of other part of left lower leg with fat layer exposed6/14/2024 No Yes DION, SIBAL ([id number]) [id number] Page 5 of 8 I89.0 Lymphedema, not elsewhere classified 02/26/2023 No Yes E11.622 Type 2 diabetes mellitus with other skin ulcer 02/26/2023 No Yes Z79.84 Long term (current) use of oral hypoglycemic drugs 02/26/2023 No Yes I10 Essential (primary) hypertension 02/26/2023 No Yes I73.89 Other specified peripheral vascular diseases 02/26/2023 No Yes Inactive Problems Resolved Problems Electronic Signature(s) Signed: 03/09/2023 11:01:06 AM By: Joe Derry PA-C Entered By: Joe Barry on 03/09/2023 11:01:06 -------------------------------------------------------------------------------- Progress Note Details Patient Name: Date of Service: Joe Artis [name] 03/09/2023 10:30 A M Medical Record Number: [id number] Patient Account Number: [id number] Date of Birth/Sex: Treating RN: May 06, 1934 (89 y.o. Joe Barry: Joe Barry Other Clinician: Referring Barry: Treating Barry/Extender: Joe Barry in Treatment: 1 Subjective Chief Complaint Information obtained from Patient Left LE Ulcers History of Present Illness (HPI) 02-26-2023 upon evaluation today patient presents for initial inspection here in our clinic concerning issues that he has been having with a wound on his left anterior lower leg. This actually began on 01-14-2023 when the patient was getting out of the car and subsequently they were turning the wheelchair around to move around the door and got too close to the edge of the door and the  corner at the bottom actually cut his leg. This because of his lymphedema has been a very hard wound to heal following. Fortunately there does not appear to be any signs of infection systemically which is good news. With that being said even locally I do not see any signs of infection at this point. I do believe that he is making fairly good progress all things considered although I think that we definitely need to clean some this out and get it moving in the right direction. Patient has been using Medihoney or rather his daughter has at home she is a Engineer, civil (consulting) she  works in the ICU office seen her previously as well as a patient. Subsequently after it was apparent that this was not getting better they did go on 7 June to [place] [facility] and at that point they actually put the patient on Augmentin which she started on 02-19-2023 and is still continuing that through current. His most recent hemoglobin A1c was 6.9 on 01-16-2023. He did have an x-ray done of the leg but this has not been released yet it still pending a reading. Currently could not tolerate the ABI screening but he seems to have good perfusion in regard to his leg I do not know that we go to gotten a lot of a good reading anyway when it comes to the ABIs even if we were able to do the screening due to the amount of lymphedema that he has with associated swelling. Patient does have a history of lymphedema, diabetes mellitus type 2, he is on oral hypoglycemic agents. He also has a history of hypertension and peripheral vascular disease although his last ABI check in 2018 was normal according to his daughter and he has seen vascular again at that time but has not had any recent ABIs performed. Joe Barry, Joe Barry ([id number]) [id number] Page 6 of 8 03-09-2023 upon evaluation today patient appears to be doing well currently in regard to his wounds. He has been tolerating the dressing changes without complication.  Fortunately I see signs of definite improvement and very pleased in this regard. Objective Constitutional Well-nourished and well-hydrated in no acute distress. Vitals Time Taken: 10:35 AM, Height: 65 in, Weight: 180 lbs, BMI: 30, Temperature: 97.5 F, Pulse: 80 bpm, Respiratory Rate: 18 breaths/min, Blood Pressure: 157/71 mmHg. Respiratory normal breathing without difficulty. Psychiatric this patient is able to make decisions and demonstrates good insight into disease process. Alert and Oriented x 3. pleasant and cooperative. General Notes: Upon inspection patient's wound again did require sharp debridement in regard to the main wound is secondary wound is actually completely healed with down to just 1 at this point which is great news and very pleased with where we stand. Integumentary (Hair, Skin) Wound #1 status is Open. Original cause of wound was Trauma. The date acquired was: 01/14/2023. The wound has been in treatment 1 weeks. The wound is located on the Left,Proximal,Midline Lower Leg. The wound measures 5.3cm length x 1cm width x 0.3cm depth; 4.163cm^2 area and 1.249cm^3 volume. There is Fat Layer (Subcutaneous Tissue) exposed. There is no tunneling or undermining noted. There is a medium amount of serosanguineous drainage noted. There is small (1-33%) red granulation within the wound bed. There is a large (67-100%) amount of necrotic tissue within the wound bed including Adherent Slough. Wound #2 status is Healed - Epithelialized. Original cause of wound was Gradually Appeared. The date acquired was: 01/28/2023. The wound has been in treatment 1 weeks. The wound is located on the Left,Distal,Medial Lower Leg. The wound measures 0cm length x 0cm width x 0cm depth; 0cm^2 area and 0cm^3 volume. There is no tunneling or undermining noted. There is a none present amount of drainage noted. There is no granulation within the wound bed. There is no necrotic tissue within the wound  bed. Assessment Active Problems ICD-10 Laceration without foreign body, left lower leg, initial encounter Non-pressure chronic ulcer of other part of left lower leg with fat layer exposed Lymphedema, not elsewhere classified Type 2 diabetes mellitus with other skin ulcer Long term (current) use of oral hypoglycemic drugs Essential (primary) hypertension Other  specified peripheral vascular diseases Procedures Wound #1 Pre-procedure diagnosis of Wound #1 is a Trauma, Other located on the Left,Proximal,Midline Lower Leg . There was a Excisional Skin/Subcutaneous Tissue Debridement with a total area of 4.16 sq cm performed by Joe Derry, PA-C. With the following instrument(s): Curette to remove Viable and Non-Viable tissue/material. Material removed includes Subcutaneous Tissue and Slough and after achieving pain control using Lidocaine 4% T opical Solution. No specimens were taken. A time out was conducted at 11:39, prior to the start of the procedure. A Moderate amount of bleeding was controlled with Pressure. The procedure was tolerated well. Post Debridement Measurements: 5.3cm length x 1cm width x 0.3cm depth; 1.249cm^3 volume. Character of Wound/Ulcer Post Debridement is stable. Post procedure Diagnosis Wound #1: Same as Pre-Procedure Plan Follow-up Appointments: Return Appointment in 1 week. Nurse Visit as needed - to check wound and re-wrap left lower leg Bathing/ Shower/ Hygiene: May shower with wound dressing protected with water repellent cover or cast protector. - pt advised to not get wrap wet No tub bath. Joe Barry, Joe Barry ([id number]) [id number] Page 7 of 8 Anesthetic (Use 'Patient Medications' Section for Anesthetic Order Entry): Lidocaine applied to wound bed Edema Control - Lymphedema / Segmental Compressive Device / Other: UrgoK2 LITE - size large used Tubigrip double layer applied - RLE size F used Elevate, Exercise Daily and Avoid  Standing for Long Periods of Time. Elevate leg(s) parallel to the floor when sitting. DO YOUR BEST to sleep in the bed at night. DO NOT sleep in your recliner. Long hours of sitting in a recliner leads to swelling of the legs and/or potential wounds on your backside. Other: - discussed juxtalite for legs in future once swelling is down Medications-Please add to medication list.: P.O. Antibiotics - continue taking previously prescribed oral antibiotics WOUND #1: - Lower Leg Wound Laterality: Left, Midline, Proximal Cleanser: Soap and Water 2 x Per Week/30 Days Discharge Instructions: Gently cleanse wound with antibacterial soap, rinse and pat dry prior to dressing wounds Prim Dressing: Silvercel Small 2x2 (in/in) 2 x Per Week/30 Days ary Discharge Instructions: Apply Silvercel Small 2x2 (in/in) as instructed Secondary Dressing: Zetuvit Plus 4x8 (in/in) 2 x Per Week/30 Days Com pression Wrap: Urgo K2 Lite, two layer compression system, regular 2 x Per Week/30 Days 1. Based on what I am seeing, recommend that we have the patient continue to utilize silver alginate dressing will cut this to tuck in place. 2. I am then recommend to continue with the Zetuvit to cover followed by the Urgo K2 lite compression wrap. We will see patient back for reevaluation in 1 week here in the clinic. If anything worsens or changes patient will contact our office for additional recommendations. Electronic Signature(s) Signed: 03/09/2023 12:43:25 PM By: Joe Derry PA-C Entered By: Joe Barry on 03/09/2023 12:43:25 -------------------------------------------------------------------------------- SuperBill Details Patient Name: Date of Service: Joe Flavin R. 03/09/2023 Medical Record Number: [id number] Patient Account Number: [id number] Date of Birth/Sex: Treating RN: [birth year] (89 y.o. Joe Barry: Joe Barry Other Clinician: Referring Barry: Treating Barry/Extender:  Joe Barry in Treatment: 1 Diagnosis Coding ICD-10 Codes Code Description [phone] Laceration without foreign body, left lower leg, initial encounter L97.822 Non-pressure chronic ulcer of other part of left lower leg with fat layer exposed I89.0 Lymphedema, not elsewhere classified E11.622 Type 2 diabetes mellitus with other skin ulcer Z79.84 Long term (current) use of oral hypoglycemic drugs I10 Essential (primary) hypertension I73.89 Other specified peripheral vascular  diseases Facility Procedures : CPT4 Code: [id number] Description: 11042 - DEB SUBQ TISSUE 20 SQ CM/< ICD-10 Diagnosis Description L97.822 Non-pressure chronic ulcer of other part of left lower leg with fat layer expos Modifier: ed Quantity: 1 Physician Procedures OIVA, DIBARI ([id number]): CPT4 Code Description [id number] 11042 - WC PHYS SUBQ TISS 20 SQ CM ICD-10 Diagnosis Description L97.822 Non-pressure chronic ulcer of other part of left lower leg with fa [id number] Page 8 of 8: Quantity Modifier 1 t layer exposed Electronic Signature(s) Signed: 03/09/2023 12:43:34 PM By: Joe Derry PA-C Entered By: Joe Barry on 03/09/2023 12:43:34

## 2023-03-09 NOTE — Progress Notes (Signed)
DAKIN, MADANI ([id number]) [id number] Page 1 of 10 Visit Report for 03/09/2023 Arrival Information Details Patient Name: Date of Service: Joe Barry [facility] R. 03/09/2023 10:30 A M Medical Record Number: [id number] Patient Account Number: [id number] Date of Birth/Sex: Treating RN: [birth year] (89 y.o. Joe Barry Primary Care Jeron Grahn: [name] Other Clinician: Referring Dushaun Okey: Treating Ripley Bogosian/Extender: Kallie Edward in Treatment: 1 Visit Information History Since Last Visit Added or deleted any medications: No Patient Arrived: Wheel Chair Any new allergies or adverse reactions: No Arrival Time: 10:32 Had a fall or experienced change in No Accompanied By: daughter activities of daily living that may affect Transfer Assistance: EasyPivot Patient Lift risk of falls: Patient Identification Verified: Yes Hospitalized since last visit: No Secondary Verification Process Completed: Yes Has Dressing in Place as Prescribed: Yes Patient Requires Transmission-Based Precautions: No Has Compression in Place as Prescribed: Yes Patient Has Alerts: No Pain Present Now: No Electronic Signature(s) Signed: 03/09/2023 4:12:56 PM By: [name] Entered By: [name] on 03/09/2023 10:34:19 -------------------------------------------------------------------------------- Clinic Level of Care Assessment Details Patient Name: Date of Service: Joe Barry [facility] R. 03/09/2023 10:30 A M Medical Record Number: [id number] Patient Account Number: [id number] Date of Birth/Sex: Treating RN: 1934-05-26 (89 y.o. Joe Barry Primary Care Jorell Agne: [name] Other Clinician: Referring Trice Aspinall: Treating Miriam Kestler/Extender: Kallie Edward in Treatment: 1 Clinic Level of Care Assessment Items TOOL 1 Quantity Score []  - 0 Use when EandM and Procedure is performed on INITIAL visit ASSESSMENTS - Nursing Assessment /  Reassessment []  - 0 General Physical Exam (combine w/ comprehensive assessment (listed just below) when performed on new pt. evals) []  - 0 Comprehensive Assessment (HX, ROS, Risk Assessments, Wounds Hx, etc.) ASSESSMENTS - Wound and Skin Assessment / Reassessment []  - 0 Dermatologic / Skin Assessment (not related to wound area) SHAHZAIN, KIESTER ([id number]) [id number] Page 2 of 10 ASSESSMENTS - Ostomy and/or Continence Assessment and Care []  - 0 Incontinence Assessment and Management []  - 0 Ostomy Care Assessment and Management (repouching, etc.) PROCESS - Coordination of Care []  - 0 Simple Patient / Family Education for ongoing care []  - 0 Complex (extensive) Patient / Family Education for ongoing care []  - 0 Staff obtains Chiropractor, Records, T Results / Process Orders est []  - 0 Staff telephones HHA, Nursing Homes / Clarify orders / etc []  - 0 Routine Transfer to another Facility (non-emergent condition) []  - 0 Routine Hospital Admission (non-emergent condition) []  - 0 New Admissions / Manufacturing engineer / Ordering NPWT Apligraf, etc. , []  - 0 Emergency Hospital Admission (emergent condition) PROCESS - Special Needs []  - 0 Pediatric / Minor Patient Management []  - 0 Isolation Patient Management []  - 0 Hearing / Language / Visual special needs []  - 0 Assessment of Community assistance (transportation, D/C planning, etc.) []  - 0 Additional assistance / Altered mentation []  - 0 Support Surface(s) Assessment (bed, cushion, seat, etc.) INTERVENTIONS - Miscellaneous []  - 0 External ear exam []  - 0 Patient Transfer (multiple staff / Nurse, adult / Similar devices) []  - 0 Simple Staple / Suture removal (25 or less) []  - 0 Complex Staple / Suture removal (26 or more) []  - 0 Hypo/Hyperglycemic Management (do not check if billed separately) []  - 0 Ankle / Brachial Index (ABI) - do not check if billed separately Has the patient been seen at  the hospital within the last three years: Yes Total Score: 0 Level Of Care: ____ Electronic Signature(s) Signed: 03/09/2023  4:12:56 PM By: [name] Entered By: [name] on 03/09/2023 11:40:22 -------------------------------------------------------------------------------- Encounter Discharge Information Details Patient Name: Date of Service: Joe Barry [place] R. 03/09/2023 10:30 A M Medical Record Number: [id number] Patient Account Number: [id number] Date of Birth/Sex: Treating RN: 1934-07-11 (89 y.o. Joe Barry Primary Care Baylin Gamblin: [name] Other Clinician: Referring Trecia Maring: Treating Blessing Zaucha/Extender: Kallie Edward in Treatment: 1 Encounter Discharge Information Items Post Procedure RAINEY, KAHRS ([id number]) [id number] Page 3 of 10 Discharge Condition: Stable Temperature (F): 97.5 Ambulatory Status: Wheelchair Pulse (bpm): 80 Discharge Destination: Home Respiratory Rate (breaths/min): 18 Transportation: Private Auto Blood Pressure (mmHg): 157/71 Accompanied By: daughter Schedule Follow-up Appointment: Yes Clinical Summary of Care: Electronic Signature(s) Signed: 03/09/2023 4:12:56 PM By: [name] Entered By: [name] on 03/09/2023 11:41:27 -------------------------------------------------------------------------------- Lower Extremity Assessment Details Patient Name: Date of Service: Joe Barry [place] R. 03/09/2023 10:30 A M Medical Record Number: [id number] Patient Account Number: [id number] Date of Birth/Sex: Treating RN: June 13, 1934 (89 y.o. Joe Barry Primary Care Dezzie Badilla: [name] Other Clinician: Referring Sharlotte Baka: Treating Xiao Graul/Extender: Kallie Edward in Treatment: 1 Edema Assessment Assessed: [Left: No] [Right: No] Edema: [Left: Ye] [Right: s] Calf Left: Right: Point of Measurement: 30 cm From Medial Instep 44.5 cm Ankle Left:  Right: Point of Measurement: 11 cm From Medial Instep 29 cm Vascular Assessment Pulses: Dorsalis Pedis Palpable: [Left:Yes] Electronic Signature(s) Signed: 03/09/2023 4:12:56 PM By: [name] Entered By: [name] on 03/09/2023 10:42:17 [name] ([id number]) [id number].pdf Page 4 of 10 -------------------------------------------------------------------------------- Multi Wound Chart Details Patient Name: Date of Service: Joe Barry [facility] R. 03/09/2023 10:30 A M Medical Record Number: [id number] Patient Account Number: [id number] Date of Birth/Sex: Treating RN: [birth year] (89 y.o. Joe Barry Primary Care Gurvir Schrom: [name] Other Clinician: Referring Westlynn Fifer: Treating Axell Trigueros/Extender: Kallie Edward in Treatment: 1 Vital Signs Height(in): 65 Pulse(bpm): 80 Weight(lbs): 180 Blood Pressure(mmHg): 157/71 Body Mass Index(BMI): 30 Temperature(F): 97.5 Respiratory Rate(breaths/min): 18 [1:Photos:] [N/A:N/A] Left, Proximal, Midline Lower Leg Left, Distal, Medial Lower Leg N/A Wound Location: Trauma Gradually Appeared N/A Wounding Event: Trauma, Other Lymphedema N/A Primary Etiology: Cataracts, Lymphedema, Cataracts, Lymphedema, N/A Comorbid History: Hypertension, Peripheral Venous Hypertension, Peripheral Venous Disease, Type II Diabetes Disease, Type II Diabetes 01/14/2023 01/28/2023 N/A Date Acquired: 1 1 N/A Weeks of Treatment: Open Healed - Epithelialized N/A Wound Status: No No N/A Wound Recurrence: 5.3x1x0.3 0x0x0 N/A Measurements L x W x D (cm) 4.163 0 N/A A (cm) : rea 1.249 0 N/A Volume (cm) : 61.50% 100.00% N/A % Reduction in Area: 61.50% 100.00% N/A % Reduction in Volume: Full Thickness Without Exposed Partial Thickness N/A Classification: Support Structures Medium None Present N/A Exudate A mount: Serosanguineous N/A N/A Exudate Type: red, brown N/A N/A Exudate Color: Small (1-33%)  None Present (0%) N/A Granulation A mount: Red N/A N/A Granulation Quality: Large (67-100%) None Present (0%) N/A Necrotic A mount: Fat Layer (Subcutaneous Tissue): Yes N/A N/A Exposed Structures: N/A Large (67-100%) N/A Epithelialization: Treatment Notes Electronic Signature(s) Signed: 03/09/2023 4:12:56 PM By: [name] Entered By: [name] on 03/09/2023 11:39:02 -------------------------------------------------------------------------------- Multi-Disciplinary Care Plan Details Patient Name: Date of Service: Joe Barry [name] 03/09/2023 10:30 A DETRIC, SCALISI ([id number]) [phone].pdf Page 5 of 10 Medical Record Number: [id number] Patient Account Number: [id number] Date of Birth/Sex: Treating RN: 01-20-1934 (89 y.o. Joe Barry Primary Care Caylen Kuwahara: [name] Other Clinician: Referring Jacquel Mccamish: Treating Rontavious Albright/Extender: Marlana Salvage,  Quante Weeks in Treatment: 1 Active Inactive Venous Leg Ulcer Nursing Diagnoses: Knowledge deficit related to disease process and management Potential for venous Insuffiency (use before diagnosis confirmed) Goals: Patient will maintain optimal edema control Date Initiated: 02/26/2023 Target Resolution Date: 04/23/2023 Goal Status: Active Patient/caregiver will verbalize understanding of disease process and disease management Date Initiated: 02/26/2023 Date Inactivated: 02/26/2023 Target Resolution Date: 02/26/2023 Goal Status: Met Interventions: Assess peripheral edema status every visit. Compression as ordered Treatment Activities: Therapeutic compression applied : 02/26/2023 Notes: Wound/Skin Impairment Nursing Diagnoses: Impaired tissue integrity Knowledge deficit related to ulceration/compromised skin integrity Goals: Ulcer/skin breakdown will have a volume reduction of 30% by week 4 Date Initiated: 02/26/2023 Target Resolution Date: 03/26/2023 Goal Status: Active Ulcer/skin  breakdown will have a volume reduction of 50% by week 8 Date Initiated: 02/26/2023 Target Resolution Date: 04/23/2023 Goal Status: Active Ulcer/skin breakdown will have a volume reduction of 80% by week 12 Date Initiated: 02/26/2023 Target Resolution Date: 05/21/2023 Goal Status: Active Ulcer/skin breakdown will heal within 14 weeks Date Initiated: 02/26/2023 Target Resolution Date: 06/04/2023 Goal Status: Active Interventions: Assess patient/caregiver ability to obtain necessary supplies Assess patient/caregiver ability to perform ulcer/skin care regimen upon admission and as needed Assess ulceration(s) every visit Provide education on ulcer and skin care Treatment Activities: Skin care regimen initiated : 02/26/2023 Notes: Electronic Signature(s) Signed: 03/09/2023 4:12:56 PM By: [name] Entered By: [name] on 03/09/2023 11:40:37 [name] ([id number]) [phone].pdf Page 6 of 10 -------------------------------------------------------------------------------- Pain Assessment Details Patient Name: Date of Service: Joe Barry [facility] R. 03/09/2023 10:30 A M Medical Record Number: [id number] Patient Account Number: [id number] Date of Birth/Sex: Treating RN: 1934-07-22 (89 y.o. Joe Barry Primary Care Ahlijah Raia: [name] Other Clinician: Referring Heath Badon: Treating Jodette Wik/Extender: Kallie Edward in Treatment: 1 Active Problems Location of Pain Severity and Description of Pain Patient Has Paino No Site Locations Rate the pain. Current Pain Level: 0 Pain Management and Medication Current Pain Management: Electronic Signature(s) Signed: 03/09/2023 4:12:56 PM By: [name] Entered By: [name] on 03/09/2023 10:35:54 -------------------------------------------------------------------------------- Patient/Caregiver Education Details Patient Name: Date of Service: Delynn Flavin R. 6/25/2024andnbsp10:30  A M Medical Record Number: [id number] Patient Account Number: [id number] Date of Birth/Gender: Treating RN: 02/07/1934 (89 y.o. Joe Barry Primary Care Physician: [name] Other Clinician: Referring Physician: Treating Physician/Extender: Kallie Edward in Treatment: 1 ONEILL, BAIS ([id number]) [id number] Page 7 of 10 Education Assessment Education Provided To: Patient Education Topics Provided Wound Debridement: Handouts: Wound Debridement Methods: Explain/Verbal Responses: State content correctly Wound/Skin Impairment: Handouts: Caring for Your Ulcer Methods: Explain/Verbal Responses: State content correctly Electronic Signature(s) Signed: 03/09/2023 4:12:56 PM By: [name] Entered By: [name] on 03/09/2023 11:40:47 -------------------------------------------------------------------------------- Wound Assessment Details Patient Name: Date of Service: Joe Barry [name] 03/09/2023 10:30 A M Medical Record Number: [id number] Patient Account Number: [id number] Date of Birth/Sex: Treating RN: 1934-01-25 (89 y.o. Joe Barry Primary Care Egan Berkheimer: [name] Other Clinician: Referring Mike Hamre: Treating Annslee Tercero/Extender: Kallie Edward in Treatment: 1 Wound Status Wound Number: 1 Primary Trauma, Other Etiology: Wound Location: Left, Proximal, Midline Lower Leg Wound Open Wounding Event: Trauma Status: Date Acquired: 01/14/2023 Comorbid Cataracts, Lymphedema, Hypertension, Peripheral Venous Weeks Of Treatment: 1 History: Disease, Type II Diabetes Clustered Wound: No Photos Wound Measurements Length: (cm) 5.3 Width: (cm) 1 Depth: (cm) 0.3 Area: (cm) 4.163 Volume: (cm) 1.249 OZ, GAMMEL ([id number]) Wound Description Classification: Full Thickness Without Exposed Su Exudate  Amount: Medium Exudate Type: Serosanguineous Exudate Color: red, brown pport Structures %  Reduction in Area: 61.5% % Reduction in Volume: 61.5% Tunneling: No Undermining: No [phone].pdf Page 8 of 10 Wound Bed Granulation Amount: Small (1-33%) Exposed Structure Granulation Quality: Red Fat Layer (Subcutaneous Tissue) Exposed: Yes Necrotic Amount: Large (67-100%) Necrotic Quality: Adherent Slough Treatment Notes Wound #1 (Lower Leg) Wound Laterality: Left, Midline, Proximal Cleanser Soap and Water Discharge Instruction: Gently cleanse wound with antibacterial soap, rinse and pat dry prior to dressing wounds Peri-Wound Care Topical Primary Dressing Silvercel Small 2x2 (in/in) Discharge Instruction: Apply Silvercel Small 2x2 (in/in) as instructed Secondary Dressing Zetuvit Plus 4x8 (in/in) Secured With Compression Wrap Urgo K2 Lite, two layer compression system, large Compression Stockings Add-Ons Electronic Signature(s) Signed: 03/09/2023 4:12:56 PM By: [name] Entered By: [name] on 03/09/2023 10:44:59 -------------------------------------------------------------------------------- Wound Assessment Details Patient Name: Date of Service: Joe Barry [name] 03/09/2023 10:30 A M Medical Record Number: [id number] Patient Account Number: [id number] Date of Birth/Sex: Treating RN: 14-Jul-1934 (89 y.o. Joe Barry Primary Care Lashea Goda: [name] Other Clinician: Referring Revel Stellmach: Treating Ceilidh Torregrossa/Extender: Kallie Edward in Treatment: 1 Wound Status Wound Number: 2 Primary Lymphedema Etiology: Wound Location: Left, Distal, Medial Lower Leg Wound Healed - Epithelialized Wounding Event: Gradually Appeared Status: Date Acquired: 01/28/2023 Comorbid Cataracts, Lymphedema, Hypertension, Peripheral Venous Weeks Of Treatment: 1 History: Disease, Type II Diabetes Clustered Wound: No Photos KERRINGTON, GREENHALGH ([id number]) [id number] Page 9 of 10 Wound Measurements Length:  (cm) Width: (cm) Depth: (cm) Area: (cm) Volume: (cm) 0 % Reduction in Area: 100% 0 % Reduction in Volume: 100% 0 Epithelialization: Large (67-100%) 0 Tunneling: No 0 Undermining: No Wound Description Classification: Partial Thickness Exudate Amount: None Present Foul Odor After Cleansing: No Slough/Fibrino No Wound Bed Granulation Amount: None Present (0%) Necrotic Amount: None Present (0%) Treatment Notes Wound #2 (Lower Leg) Wound Laterality: Left, Medial, Distal Cleanser Peri-Wound Care Topical Primary Dressing Secondary Dressing Secured With Compression Wrap Compression Stockings Add-Ons Electronic Signature(s) Signed: 03/09/2023 4:12:56 PM By: [name] Entered By: [name] on 03/09/2023 11:38:42 -------------------------------------------------------------------------------- Vitals Details Patient Name: Date of Service: Joe Barry [name] 03/09/2023 10:30 A M Medical Record Number: [id number] Patient Account Number: [id number] Date of Birth/Sex: Treating RN: [birth year] (89 y.o. Joe Barry Primary Care Grettel Rames: [name] Other Clinician: Referring Makel Mcmann: Treating Debby Clyne/Extender: Kallie Edward in Treatment: 1 NEVYN, BOSSMAN ([id number]) [id number] Page 10 of 10 Vital Signs Time Taken: 10:35 Temperature (F): 97.5 Height (in): 65 Pulse (bpm): 80 Weight (lbs): 180 Respiratory Rate (breaths/min): 18 Body Mass Index (BMI): 30 Blood Pressure (mmHg): 157/71 Reference Range: 80 - 120 mg / dl Electronic Signature(s) Signed: 03/09/2023 4:12:56 PM By: [name] Entered By: [name] on 03/09/2023 10:35:48

## 2023-03-16 ENCOUNTER — Encounter: Payer: Medicare PPO | Attending: Physician Assistant | Admitting: Physician Assistant

## 2023-03-16 DIAGNOSIS — L97822 Non-pressure chronic ulcer of other part of left lower leg with fat layer exposed: Secondary | ICD-10-CM | POA: Insufficient documentation

## 2023-03-16 DIAGNOSIS — S81812A Laceration without foreign body, left lower leg, initial encounter: Secondary | ICD-10-CM | POA: Diagnosis not present

## 2023-03-16 DIAGNOSIS — I10 Essential (primary) hypertension: Secondary | ICD-10-CM | POA: Diagnosis not present

## 2023-03-16 DIAGNOSIS — W228XXA Striking against or struck by other objects, initial encounter: Secondary | ICD-10-CM | POA: Diagnosis not present

## 2023-03-16 DIAGNOSIS — I890 Lymphedema, not elsewhere classified: Secondary | ICD-10-CM | POA: Insufficient documentation

## 2023-03-16 DIAGNOSIS — Z7984 Long term (current) use of oral hypoglycemic drugs: Secondary | ICD-10-CM | POA: Insufficient documentation

## 2023-03-16 DIAGNOSIS — S81802A Unspecified open wound, left lower leg, initial encounter: Secondary | ICD-10-CM | POA: Diagnosis not present

## 2023-03-16 DIAGNOSIS — E11622 Type 2 diabetes mellitus with other skin ulcer: Secondary | ICD-10-CM | POA: Diagnosis not present

## 2023-03-16 NOTE — Progress Notes (Addendum)
Joe Barry, Joe Barry ([id number]) [id number] Page 1 of 9 Visit Report for 03/16/2023 Arrival Information Details Patient Name: Date of Service: Joe Barry [facility] R. 03/16/2023 10:30 A M Medical Record Number: [id number] Patient Account Number: [id number] Date of Birth/Sex: Treating RN: 02/14/1934 (89 y.o. Joe Barry Primary Care Garren Greenman: [name] Other Clinician: Referring Starlee Corralejo: Treating Rosie Golson/Extender: Kallie Edward in Treatment: 2 Visit Information History Since Last Visit Added or deleted any medications: No Patient Arrived: Wheel Chair Any new allergies or adverse reactions: No Arrival Time: 10:35 Had a fall or experienced change in No Accompanied By: daughter activities of daily living that may affect Transfer Assistance: EasyPivot Patient Lift risk of falls: Patient Identification Verified: Yes Hospitalized since last visit: No Secondary Verification Process Completed: Yes Has Dressing in Place as Prescribed: Yes Patient Requires Transmission-Based Precautions: No Has Compression in Place as Prescribed: Yes Patient Has Alerts: No Pain Present Now: No Electronic Signature(s) Signed: 03/16/2023 4:14:56 PM By: [name] Entered By: [name] on 03/16/2023 10:35:59 -------------------------------------------------------------------------------- Clinic Level of Care Assessment Details Patient Name: Date of Service: Joe Barry [facility] R. 03/16/2023 10:30 A M Medical Record Number: [id number] Patient Account Number: [id number] Date of Birth/Sex: Treating RN: November 30, 1933 (89 y.o. Joe Barry Primary Care Ioan Landini: [name] Other Clinician: Referring Nadiya Pieratt: Treating Aron Needles/Extender: Kallie Edward in Treatment: 2 Clinic Level of Care Assessment Items TOOL 1 Quantity Score []  - 0 Use when EandM and Procedure is performed on INITIAL visit ASSESSMENTS - Nursing Assessment /  Reassessment []  - 0 General Physical Exam (combine w/ comprehensive assessment (listed just below) when performed on new pt. evals) []  - 0 Comprehensive Assessment (HX, ROS, Risk Assessments, Wounds Hx, etc.) ASSESSMENTS - Wound and Skin Assessment / Reassessment []  - 0 Dermatologic / Skin Assessment (not related to wound area) Joe Barry, Joe Barry ([id number]) [id number] Page 2 of 9 ASSESSMENTS - Ostomy and/or Continence Assessment and Care []  - 0 Incontinence Assessment and Management []  - 0 Ostomy Care Assessment and Management (repouching, etc.) PROCESS - Coordination of Care []  - 0 Simple Patient / Family Education for ongoing care []  - 0 Complex (extensive) Patient / Family Education for ongoing care []  - 0 Staff obtains Chiropractor, Records, T Results / Process Orders est []  - 0 Staff telephones HHA, Nursing Homes / Clarify orders / etc []  - 0 Routine Transfer to another Facility (non-emergent condition) []  - 0 Routine Hospital Admission (non-emergent condition) []  - 0 New Admissions / Manufacturing engineer / Ordering NPWT Apligraf, etc. , []  - 0 Emergency Hospital Admission (emergent condition) PROCESS - Special Needs []  - 0 Pediatric / Minor Patient Management []  - 0 Isolation Patient Management []  - 0 Hearing / Language / Visual special needs []  - 0 Assessment of Community assistance (transportation, D/C planning, etc.) []  - 0 Additional assistance / Altered mentation []  - 0 Support Surface(s) Assessment (bed, cushion, seat, etc.) INTERVENTIONS - Miscellaneous []  - 0 External ear exam []  - 0 Patient Transfer (multiple staff / Nurse, adult / Similar devices) []  - 0 Simple Staple / Suture removal (25 or less) []  - 0 Complex Staple / Suture removal (26 or more) []  - 0 Hypo/Hyperglycemic Management (do not check if billed separately) []  - 0 Ankle / Brachial Index (ABI) - do not check if billed separately Has the patient been seen at  the hospital within the last three years: Yes Total Score: 0 Level Of Care: ____ Electronic Signature(s) Signed: 03/16/2023  4:14:56 PM By: [name] Entered By: [name] on 03/16/2023 13:18:14 -------------------------------------------------------------------------------- Compression Therapy Details Patient Name: Date of Service: Joe Barry [facility] R. 03/16/2023 10:30 A M Medical Record Number: [id number] Patient Account Number: [id number] Date of Birth/Sex: Treating RN: Aug 07, 1934 (89 y.o. Joe Barry Primary Care Macyn Shropshire: [name] Other Clinician: Referring Kwane Rohl: Treating Kashaun Bebo/Extender: Kallie Edward in Treatment: 2 Compression Therapy Performed for Wound Assessment: Wound #1 Left,Proximal,Midline Lower Leg Performed By: Clinician [name], RN Compression TypePerrin Maltese Joe Barry, Joe Barry ([id number]) [id number] Page 3 of 9 Post Procedure Diagnosis Same as Pre-procedure Electronic Signature(s) Signed: 03/16/2023 4:14:56 PM By: [name] Entered By: [name] on 03/16/2023 11:17:20 -------------------------------------------------------------------------------- Compression Therapy Details Patient Name: Date of Service: Joe Barry Joe R. 03/16/2023 10:30 A M Medical Record Number: [id number] Patient Account Number: [id number] Date of Birth/Sex: Treating RN: 08-17-1934 (89 y.o. Joe Barry Primary Care Chelly Dombeck: [name] Other Clinician: Referring Cherrish Vitali: Treating Nicki Furlan/Extender: Kallie Edward in Treatment: 2 Compression Therapy Performed for Wound Assessment: NonWound Condition Lymphedema - Right Leg Performed By: Clinician [name], RN Compression Type: Double Layer Post Procedure Diagnosis Same as Pre-procedure Electronic Signature(s) Signed: 03/16/2023 4:14:56 PM By: [name] Entered By: [name] on 03/16/2023  11:17:32 -------------------------------------------------------------------------------- Encounter Discharge Information Details Patient Name: Date of Service: Joe Barry Joe R. 03/16/2023 10:30 A M Medical Record Number: [id number] Patient Account Number: [id number] Date of Birth/Sex: Treating RN: 1934-02-13 (89 y.o. Joe Barry Primary Care Mahagony Grieb: [name] Other Clinician: Referring Tashanna Dolin: Treating Jerrico Covello/Extender: Kallie Edward in Treatment: 2 Encounter Discharge Information Items Post Procedure Vitals Discharge Condition: Stable Temperature (F): 98 Ambulatory Status: Wheelchair Pulse (bpm): 83 Discharge Destination: Home Respiratory Rate (breaths/min): 18 Transportation: Private Auto Blood Pressure (mmHg): 148/68 Accompanied By: daughter Schedule Follow-up Appointment: Yes Clinical Summary of Care: Joe Barry, Joe Barry ([id number]) [id number] Page 4 of 9 Electronic Signature(s) Signed: 03/16/2023 1:19:37 PM By: [name] Entered By: [name] on 03/16/2023 13:19:37 -------------------------------------------------------------------------------- Lower Extremity Assessment Details Patient Name: Date of Service: Joe Barry [place] R. 03/16/2023 10:30 A M Medical Record Number: [id number] Patient Account Number: [id number] Date of Birth/Sex: Treating RN: August 20, 1934 (89 y.o. Joe Barry Primary Care Mariah Gerstenberger: [name] Other Clinician: Referring Nelson Noone: Treating Daymond Cordts/Extender: Kallie Edward in Treatment: 2 Edema Assessment Assessed: [Left: No] [Right: No] Edema: [Left: Yes] [Right: Yes] Calf Left: Right: Point of Measurement: 30 cm From Medial Instep 44 cm 45.9 cm Ankle Left: Right: Point of Measurement: 11 cm From Medial Instep 28.5 cm 31.5 cm Vascular Assessment Pulses: Dorsalis Pedis Palpable: [Left:Yes] [Right:Yes] Electronic Signature(s) Signed: 03/16/2023 4:14:56 PM  By: [name] Entered By: [name] on 03/16/2023 10:45:31 -------------------------------------------------------------------------------- Multi Wound Chart Details Patient Name: Date of Service: Joe Barry Joe R. 03/16/2023 10:30 A M Medical Record Number: [id number] Patient Account Number: [id number] Date of Birth/Sex: Treating RN: [birth year] (89 y.o. Joe Barry Primary Care Esterlene Atiyeh: [name] Other Clinician: Referring Tarnesha Ulloa: Treating Akshath Mccarey/Extender: Kallie Edward in Treatment: 2 Joe Barry, Joe Barry ([id number]) [id number] Page 5 of 9 Vital Signs Height(in): 65 Pulse(bpm): 83 Weight(lbs): 180 Blood Pressure(mmHg): 148/68 Body Mass Index(BMI): 30 Temperature(F): 98 Respiratory Rate(breaths/min): 18 [1:Photos:] [N/A:N/A] Left, Proximal, Midline Lower Leg N/A N/A Wound Location: Trauma N/A N/A Wounding Event: Trauma, Other N/A N/A Primary Etiology: Cataracts, Lymphedema, N/A N/A Comorbid History: Hypertension, Peripheral Venous Disease, Type II Diabetes 01/14/2023 N/A N/A  Date Acquired: 2 N/A N/A Weeks of Treatment: Open N/A N/A Wound Status: No N/A N/A Wound Recurrence: 4.8x0.8x0.3 N/A N/A Measurements L x W x D (cm) 3.016 N/A N/A A (cm) : rea 0.905 N/A N/A Volume (cm) : 72.10% N/A N/A % Reduction in Area: 72.10% N/A N/A % Reduction in Volume: Full Thickness Without Exposed N/A N/A Classification: Support Structures Medium N/A N/A Exudate A mount: Serosanguineous N/A N/A Exudate Type: red, brown N/A N/A Exudate Color: Medium (34-66%) N/A N/A Granulation A mount: Red N/A N/A Granulation Quality: Medium (34-66%) N/A N/A Necrotic A mount: Fat Layer (Subcutaneous Tissue): Yes N/A N/A Exposed Structures: Small (1-33%) N/A N/A Epithelialization: Treatment Notes Electronic Signature(s) Signed: 03/16/2023 4:14:56 PM By: [name] Entered By: [name] on 03/16/2023  11:13:59 -------------------------------------------------------------------------------- Multi-Disciplinary Care Plan Details Patient Name: Date of Service: Joe Barry Joe R. 03/16/2023 10:30 A M Medical Record Number: [id number] Patient Account Number: [id number] Date of Birth/Sex: Treating RN: 06-23-1934 (89 y.o. Joe Barry Primary Care Makennah Omura: [name] Other Clinician: Referring Elainna Eshleman: Treating Hayes Rehfeldt/Extender: Kallie Edward in Treatment: [address] Joe Barry, Joe Barry ([id number]) [id number] Page 6 of 9 Venous Leg Ulcer Nursing Diagnoses: Knowledge deficit related to disease process and management Potential for venous Insuffiency (use before diagnosis confirmed) Goals: Patient will maintain optimal edema control Date Initiated: 02/26/2023 Target Resolution Date: 04/23/2023 Goal Status: Active Patient/caregiver will verbalize understanding of disease process and disease management Date Initiated: 02/26/2023 Date Inactivated: 02/26/2023 Target Resolution Date: 02/26/2023 Goal Status: Met Interventions: Assess peripheral edema status every visit. Compression as ordered Treatment Activities: Therapeutic compression applied : 02/26/2023 Notes: Wound/Skin Impairment Nursing Diagnoses: Impaired tissue integrity Knowledge deficit related to ulceration/compromised skin integrity Goals: Ulcer/skin breakdown will have a volume reduction of 30% by week 4 Date Initiated: 02/26/2023 Target Resolution Date: 03/26/2023 Goal Status: Active Ulcer/skin breakdown will have a volume reduction of 50% by week 8 Date Initiated: 02/26/2023 Target Resolution Date: 04/23/2023 Goal Status: Active Ulcer/skin breakdown will have a volume reduction of 80% by week 12 Date Initiated: 02/26/2023 Target Resolution Date: 05/21/2023 Goal Status: Active Ulcer/skin breakdown will heal within 14 weeks Date Initiated: 02/26/2023 Target Resolution Date:  06/04/2023 Goal Status: Active Interventions: Assess patient/caregiver ability to obtain necessary supplies Assess patient/caregiver ability to perform ulcer/skin care regimen upon admission and as needed Assess ulceration(s) every visit Provide education on ulcer and skin care Treatment Activities: Skin care regimen initiated : 02/26/2023 Notes: Electronic Signature(s) Signed: 03/16/2023 1:18:32 PM By: [name] Entered By: [name] on 03/16/2023 13:18:32 -------------------------------------------------------------------------------- Pain Assessment Details Patient Name: Date of Service: Joe Barry Joe R. 03/16/2023 10:30 A Joe Barry, Joe Barry ([id number]) [id number] Page 7 of 9 Medical Record Number: [id number] Patient Account Number: [id number] Date of Birth/Sex: Treating RN: 11-20-1933 (89 y.o. Joe Barry Primary Care Tamicka Shimon: [name] Other Clinician: Referring Vinson Tietze: Treating Mamie Diiorio/Extender: Kallie Edward in Treatment: 2 Active Problems Location of Pain Severity and Description of Pain Patient Has Paino No Site Locations Rate the pain. Current Pain Level: 0 Pain Management and Medication Current Pain Management: Electronic Signature(s) Signed: 03/16/2023 4:14:56 PM By: [name] Entered By: [name] on 03/16/2023 10:36:34 -------------------------------------------------------------------------------- Patient/Caregiver Education Details Patient Name: Date of Service: Joe Barry R. 7/2/2024andnbsp10:30 A M Medical Record Number: [id number] Patient Account Number: [id number] Date of Birth/Gender: Treating RN: [birth year] (89 y.o. Joe Barry Primary Care Physician: [name] Other Clinician: Referring Physician: Treating Physician/Extender: Marlana Salvage,  Vivianne Spence in Treatment: 2 Education Assessment Education Provided To: Patient Education Topics Provided Wound  Debridement: Handouts: Wound Debridement Methods: Explain/Verbal Responses: State content correctly Wound/Skin Impairment: Handouts: Caring for Your Ulcer Joe Barry, Joe Barry ([id number]) [id number] Page 8 of 9 Methods: Explain/Verbal Responses: State content correctly Electronic Signature(s) Signed: 03/16/2023 4:14:56 PM By: [name] Entered By: [name] on 03/16/2023 13:18:48 -------------------------------------------------------------------------------- Wound Assessment Details Patient Name: Date of Service: Joe Barry Joe R. 03/16/2023 10:30 A M Medical Record Number: [id number] Patient Account Number: [id number] Date of Birth/Sex: Treating RN: 19-Apr-1934 (89 y.o. Joe Barry Primary Care Geanie Pacifico: [name] Other Clinician: Referring Dagen Beevers: Treating Kevin Space/Extender: Kallie Edward in Treatment: 2 Wound Status Wound Number: 1 Primary Trauma, Other Etiology: Wound Location: Left, Proximal, Midline Lower Leg Wound Open Wounding Event: Trauma Status: Date Acquired: 01/14/2023 Comorbid Cataracts, Lymphedema, Hypertension, Peripheral Venous Weeks Of Treatment: 2 History: Disease, Type II Diabetes Clustered Wound: No Photos Wound Measurements Length: (cm) 4.8 Width: (cm) 0.8 Depth: (cm) 0.3 Area: (cm) 3.016 Volume: (cm) 0.905 % Reduction in Area: 72.1% % Reduction in Volume: 72.1% Epithelialization: Small (1-33%) Tunneling: No Undermining: No Wound Description Classification: Full Thickness Without Exposed Support Structures Exudate Amount: Medium Exudate Type: Serosanguineous Exudate Color: red, brown Foul Odor After Cleansing: No Slough/Fibrino Yes Wound Bed Granulation Amount: Medium (34-66%) Exposed Structure Granulation Quality: Red Fat Layer (Subcutaneous Tissue) Exposed: Yes Necrotic Amount: Medium (34-66%) Necrotic Quality: [address] [place] ([id number])  [id number] Page 9 of 9 Treatment Notes Wound #1 (Lower Leg) Wound Laterality: Left, Midline, Proximal Cleanser Soap and Water Discharge Instruction: Gently cleanse wound with antibacterial soap, rinse and pat dry prior to dressing wounds Peri-Wound Care Topical Primary Dressing Promogran Matrix 4.34 (in) Discharge Instruction: Moisten w/normal saline or sterile water; Cover wound as directed. Do not remove from wound bed. Secondary Dressing Zetuvit Plus 4x8 (in/in) Secured With Compression Wrap Urgo K2 Lite, two layer compression system, regular Compression Stockings Add-Ons Electronic Signature(s) Signed: 03/16/2023 4:14:56 PM By: [name] Entered By: [name] on 03/16/2023 10:45:23 -------------------------------------------------------------------------------- Vitals Details Patient Name: Date of Service: Joe Barry Joe R. 03/16/2023 10:30 A M Medical Record Number: [id number] Patient Account Number: [id number] Date of Birth/Sex: Treating RN: 1934/06/23 (89 y.o. Joe Barry Primary Care Bryne Lindon: [name] Other Clinician: Referring Henny Strauch: Treating Anglea Gordner/Extender: Kallie Edward in Treatment: 2 Vital Signs Time Taken: 10:35 Temperature (F): 98 Height (in): 65 Pulse (bpm): 83 Weight (lbs): 180 Respiratory Rate (breaths/min): 18 Body Mass Index (BMI): 30 Blood Pressure (mmHg): 148/68 Reference Range: 80 - 120 mg / dl Electronic Signature(s) Signed: 03/16/2023 4:14:56 PM By: [name] Entered By: [name] on 03/16/2023 10:36:28

## 2023-03-16 NOTE — Progress Notes (Addendum)
TERELL, STITELER ([id number]) [id number] Page 1 of 8 Visit Report for 03/16/2023 Chief Complaint Document Details Patient Name: Date of Service: Joe Barry [facility] R. 03/16/2023 10:30 A M Medical Record Number: [id number] Patient Account Number: [id number] Date of Birth/Sex: Treating RN: [birth year] (89 y.o. Joe Barry Primary Care Provider: [name] Other Clinician: Referring Provider: Treating Provider/Extender: Kallie Edward in Treatment: 2 Information Obtained from: Patient Chief Complaint Left LE Ulcers Electronic Signature(s) Signed: 03/16/2023 10:24:20 AM By: [name] PA-C Entered By: [name] on 03/16/2023 10:24:20 -------------------------------------------------------------------------------- Debridement Details Patient Name: Date of Service: Joe Barry [name] 03/16/2023 10:30 A M Medical Record Number: [id number] Patient Account Number: [id number] Date of Birth/Sex: Treating RN: 25-Jan-1934 (89 y.o. Joe Barry Primary Care Provider: [name] Other Clinician: Referring Provider: Treating Provider/Extender: Kallie Edward in Treatment: 2 Debridement Performed for Assessment: Wound #1 Left,Proximal,Midline Lower Leg Performed By: Physician [name], PA-C Debridement Type: Debridement Level of Consciousness (Pre-procedure): Awake and Alert Pre-procedure Verification/Time Out Yes - 11:15 Taken: Pain Control: Lidocaine 4% T opical Solution Percent of Wound Bed Debrided: 100% T Area Debrided (cm): otal 3.01 Tissue and other material debrided: Viable, Non-Viable, Slough, Subcutaneous, Slough Level: Skin/Subcutaneous Tissue Debridement Description: Excisional Instrument: Curette Bleeding: Minimum Hemostasis Achieved: Pressure Response to Treatment: Procedure was tolerated well Level of Consciousness (Post- Awake and Alert procedure): CALVERT, POLSON ([id number])  [id number] Page 2 of 8 Post Debridement Measurements of Total Wound Length: (cm) 4.8 Width: (cm) 0.8 Depth: (cm) 0.3 Volume: (cm) 0.905 Character of Wound/Ulcer Post Debridement: Stable Post Procedure Diagnosis Same as Pre-procedure Electronic Signature(s) Signed: 03/16/2023 4:14:56 PM By: [name] Signed: 03/16/2023 5:13:07 PM By: [name] PA-C Entered By: [name] on 03/16/2023 11:17:07 -------------------------------------------------------------------------------- HPI Details Patient Name: Date of Service: Joe Barry [name] 03/16/2023 10:30 A M Medical Record Number: [id number] Patient Account Number: [id number] Date of Birth/Sex: Treating RN: [birth year] (89 y.o. Joe Barry Primary Care Provider: [name] Other Clinician: Referring Provider: Treating Provider/Extender: Kallie Edward in Treatment: 2 History of Present Illness HPI Description: 02-26-2023 upon evaluation today patient presents for initial inspection here in our clinic concerning issues that he has been having with a wound on his left anterior lower leg. This actually began on 01-14-2023 when the patient was getting out of the car and subsequently they were turning the wheelchair around to move around the door and got too close to the edge of the door and the corner at the bottom actually cut his leg. This because of his lymphedema has been a very hard wound to heal following. Fortunately there does not appear to be any signs of infection systemically which is good news. With that being said even locally I do not see any signs of infection at this point. I do believe that he is making fairly good progress all things considered although I think that we definitely need to clean some this out and get it moving in the right direction. Patient has been using Medihoney or rather his daughter has at home she is a Engineer, civil (consulting) she works in the ICU office seen her  previously as well as a patient. Subsequently after it was apparent that this was not getting better they did go on 7 June to [place] [facility] and at that point they actually put the patient on Augmentin which she started on 02-19-2023 and is still continuing that through current. His most  recent hemoglobin A1c was 6.9 on 01-16-2023. He did have an x-ray done of the leg but this has not been released yet it still pending a reading. Currently could not tolerate the ABI screening but he seems to have good perfusion in regard to his leg I do not know that we go to gotten a lot of a good reading anyway when it comes to the ABIs even if we were able to do the screening due to the amount of lymphedema that he has with associated swelling. Patient does have a history of lymphedema, diabetes mellitus type 2, he is on oral hypoglycemic agents. He also has a history of hypertension and peripheral vascular disease although his last ABI check in 2018 was normal according to his daughter and he has seen vascular again at that time but has not had any recent ABIs performed. 03-09-2023 upon evaluation today patient appears to be doing well currently in regard to his wounds. He has been tolerating the dressing changes without complication. Fortunately I see signs of definite improvement and very pleased in this regard. 03-16-2023 upon evaluation today patient appears to be doing well currently in regard to his wound which is actually showing signs of improvement. Fortunately I do not see any evidence of active infection locally nor systemically at this time. Electronic Signature(s) Signed: 03/16/2023 4:51:47 PM By: [name] PA-C Entered By: [name] on 03/16/2023 16:51:47 GEMINI, BLANCHETT ([id number]) [id number] Page 3 of 8 -------------------------------------------------------------------------------- Physical Exam Details Patient Name: Date of Service: Joe Barry [place] R.  03/16/2023 10:30 A M Medical Record Number: [id number] Patient Account Number: [id number] Date of Birth/Sex: Treating RN: October 23, 1933 (89 y.o. Joe Barry Primary Care Provider: [name] Other Clinician: Referring Provider: Treating Provider/Extender: Kallie Edward in Treatment: 2 Constitutional Well-nourished and well-hydrated in no acute distress. Respiratory normal breathing without difficulty. Psychiatric this patient is able to make decisions and demonstrates good insight into disease process. Alert and Oriented x 3. pleasant and cooperative. Notes Upon inspection patient's wound bed showed signs of good granulation epithelization at this point. Fortunately I do not see any evidence of worsening in general and I do think that we are making good headway towards closure in that regard. I am very pleased with where things stand at this point. I do think we can switch over to collagen however for the wound to try to get this moving faster. Electronic Signature(s) Signed: 03/16/2023 4:52:26 PM By: [name] PA-C Entered By: [name] on 03/16/2023 16:52:26 -------------------------------------------------------------------------------- Physician Orders Details Patient Name: Date of Service: Joe Barry [name] 03/16/2023 10:30 A M Medical Record Number: [id number] Patient Account Number: [id number] Date of Birth/Sex: Treating RN: 1934/01/31 (89 y.o. Joe Barry Primary Care Provider: [name] Other Clinician: Referring Provider: Treating Provider/Extender: Kallie Edward in Treatment: 2 Verbal / Phone Orders: No Diagnosis Coding ICD-10 Coding Code Description [phone] Laceration without foreign body, left lower leg, initial encounter L97.822 Non-pressure chronic ulcer of other part of left lower leg with fat layer exposed I89.0 Lymphedema, not elsewhere classified E11.622 Type 2 diabetes mellitus with other skin ulcer Z79.84  Long term (current) use of oral hypoglycemic drugs I10 Essential (primary) hypertension PLEASANT, DOBIN ([id number]) [id number] Page 4 of 8 I73.89 Other specified peripheral vascular diseases Follow-up Appointments Return Appointment in 1 week. Nurse Visit as needed Bathing/ Shower/ Hygiene May shower with wound dressing protected with water repellent cover or cast protector. - pt advised  to not get wrap wet No tub bath. Anesthetic (Use 'Patient Medications' Section for Anesthetic Order Entry) Lidocaine applied to wound bed Edema Control - Lymphedema / Segmental Compressive Device / Other UrgoK2 LITE - size large used Elevate, Exercise Daily and A void Standing for Long Periods of Time. Elevate leg(s) parallel to the floor when sitting. DO YOUR BEST to sleep in the bed at night. DO NOT sleep in your recliner. Long hours of sitting in a recliner leads to swelling of the legs and/or potential wounds on your backside. Other: - discussed juxtalite for legs in future once swelling is down Non-Wound Condition Right Lower Extremity pply appropriate compression. - urgo lite large used A dditional non-wound orders/instructions: - AandD ointment to bilat legs prior to wrapping A Medications-Please add to medication list. ntibiotics - continue taking previously prescribed oral antibiotics P.O. A Wound Treatment Wound #1 - Lower Leg Wound Laterality: Left, Midline, Proximal Cleanser: Soap and Water 2 x Per Week/30 Days Discharge Instructions: Gently cleanse wound with antibacterial soap, rinse and pat dry prior to dressing wounds Prim Dressing: Promogran Matrix 4.34 (in) 2 x Per Week/30 Days ary Discharge Instructions: Moisten w/normal saline or sterile water; Cover wound as directed. Do not remove from wound bed. Secondary Dressing: Zetuvit Plus 4x8 (in/in) 2 x Per Week/30 Days Compression Wrap: Urgo K2 Lite, two layer compression system, regular 2 x Per  Week/30 Days Electronic Signature(s) Signed: 03/16/2023 4:14:56 PM By: [name] Signed: 03/16/2023 5:13:07 PM By: [name] PA-C Entered By: [name] on 03/16/2023 11:18:21 -------------------------------------------------------------------------------- Problem List Details Patient Name: Date of Service: Joe Barry [name] 03/16/2023 10:30 A M Medical Record Number: [id number] Patient Account Number: [id number] Date of Birth/Sex: Treating RN: 08/26/1934 (89 y.o. Joe Barry Primary Care Provider: [name] Other Clinician: Referring Provider: Treating Provider/Extender: Kallie Edward in Treatment: 2 Active Problems ICD-10 Encounter Code Description Active Date MDM Diagnosis S81.812A Laceration without foreign body, left lower leg, initial encounter 02/26/2023 No Yes JEFFER, MULLIKIN ([id number]) [id number] Page 5 of 8 [phone] Non-pressure chronic ulcer of other part of left lower leg with fat layer exposed6/14/2024 No Yes I89.0 Lymphedema, not elsewhere classified 02/26/2023 No Yes E11.622 Type 2 diabetes mellitus with other skin ulcer 02/26/2023 No Yes Z79.84 Long term (current) use of oral hypoglycemic drugs 02/26/2023 No Yes I10 Essential (primary) hypertension 02/26/2023 No Yes I73.89 Other specified peripheral vascular diseases 02/26/2023 No Yes Inactive Problems Resolved Problems Electronic Signature(s) Signed: 03/16/2023 10:24:17 AM By: [name] PA-C Entered By: [name] on 03/16/2023 10:24:17 -------------------------------------------------------------------------------- Progress Note Details Patient Name: Date of Service: Joe Barry [name] 03/16/2023 10:30 A M Medical Record Number: [id number] Patient Account Number: [id number] Date of Birth/Sex: Treating RN: 11-08-1933 (89 y.o. Joe Barry Primary Care Provider: [name] Other Clinician: Referring Provider: Treating Provider/Extender: Kallie Edward in Treatment: 2 Subjective Chief Complaint Information obtained from Patient Left LE Ulcers History of Present Illness (HPI) 02-26-2023 upon evaluation today patient presents for initial inspection here in our clinic concerning issues that he has been having with a wound on his left anterior lower leg. This actually began on 01-14-2023 when the patient was getting out of the car and subsequently they were turning the wheelchair around to move around the door and got too close to the edge of the door and the corner at the bottom actually cut his leg. This because of his lymphedema has been a very hard  wound to heal following. Fortunately there does not appear to be any signs of infection systemically which is good news. With that being said even locally I do not see any signs of infection at this point. I do believe that he is making fairly good progress all things considered although I think that we definitely need to clean some this out and get it moving in the right direction. Patient has been using Medih[employer] or rather his daughter has at home she is a Engineer, civil (consulting) she works in the ICU office seen her previously as well as a patient. Subsequently after it was apparent that this was not getting better they did go on 7 June to [place] [facility] and at that point they actually put the patient on Augmentin which she started on 02-19-2023 and is still continuing that through current. His most recent hemoglobin A1c was 6.9 on 01-16-2023. He did have an x-ray done of the leg but this has not been released yet it still pending a reading. Currently could not tolerate the ABI screening but he seems to have CALAB, KATZENMEYER ([id number]) [id number] Page 6 of 8 good perfusion in regard to his leg I do not know that we go to gotten a lot of a good reading anyway when it comes to the ABIs even if we were able to do the screening due to the amount of lymphedema that  he has with associated swelling. Patient does have a history of lymphedema, diabetes mellitus type 2, he is on oral hypoglycemic agents. He also has a history of hypertension and peripheral vascular disease although his last ABI check in 2018 was normal according to his daughter and he has seen vascular again at that time but has not had any recent ABIs performed. 03-09-2023 upon evaluation today patient appears to be doing well currently in regard to his wounds. He has been tolerating the dressing changes without complication. Fortunately I see signs of definite improvement and very pleased in this regard. 03-16-2023 upon evaluation today patient appears to be doing well currently in regard to his wound which is actually showing signs of improvement. Fortunately I do not see any evidence of active infection locally nor systemically at this time. Objective Constitutional Well-nourished and well-hydrated in no acute distress. Vitals Time Taken: 10:35 AM, Height: 65 in, Weight: 180 lbs, BMI: 30, Temperature: 98 F, Pulse: 83 bpm, Respiratory Rate: 18 breaths/min, Blood Pressure: 148/68 mmHg. Respiratory normal breathing without difficulty. Psychiatric this patient is able to make decisions and demonstrates good insight into disease process. Alert and Oriented x 3. pleasant and cooperative. General Notes: Upon inspection patient's wound bed showed signs of good granulation epithelization at this point. Fortunately I do not see any evidence of worsening in general and I do think that we are making good headway towards closure in that regard. I am very pleased with where things stand at this point. I do think we can switch over to collagen however for the wound to try to get this moving faster. Integumentary (Hair, Skin) Wound #1 status is Open. Original cause of wound was Trauma. The date acquired was: 01/14/2023. The wound has been in treatment 2 weeks. The wound is located on the Left,Proximal,Midline  Lower Leg. The wound measures 4.8cm length x 0.8cm width x 0.3cm depth; 3.016cm^2 area and 0.905cm^3 volume. There is Fat Layer (Subcutaneous Tissue) exposed. There is no tunneling or undermining noted. There is a medium amount of serosanguineous drainage noted. There is medium (34-66%) red granulation  within the wound bed. There is a medium (34-66%) amount of necrotic tissue within the wound bed including Adherent Slough. Assessment Active Problems ICD-10 Laceration without foreign body, left lower leg, initial encounter Non-pressure chronic ulcer of other part of left lower leg with fat layer exposed Lymphedema, not elsewhere classified Type 2 diabetes mellitus with other skin ulcer Long term (current) use of oral hypoglycemic drugs Essential (primary) hypertension Other specified peripheral vascular diseases Procedures Wound #1 Pre-procedure diagnosis of Wound #1 is a Trauma, Other located on the Left,Proximal,Midline Lower Leg . There was a Excisional Skin/Subcutaneous Tissue Debridement with a total area of 3.01 sq cm performed by [name], PA-C. With the following instrument(s): Curette to remove Viable and Non-Viable tissue/material. Material removed includes Subcutaneous Tissue and Slough and after achieving pain control using Lidocaine 4% T opical Solution. No specimens were taken. A time out was conducted at 11:15, prior to the start of the procedure. A Minimum amount of bleeding was controlled with Pressure. The procedure was tolerated well. Post Debridement Measurements: 4.8cm length x 0.8cm width x 0.3cm depth; 0.905cm^3 volume. Character of Wound/Ulcer Post Debridement is stable. Post procedure Diagnosis Wound #1: Same as Pre-Procedure Pre-procedure diagnosis of Wound #1 is a Trauma, Other located on the Left,Proximal,Midline Lower Leg . There was a Double Layer Compression Therapy Procedure by [name], RN. Post procedure Diagnosis Wound #1: Same as  Pre-Procedure There was a Double Layer Compression Therapy Procedure by [name], RN. Post procedure Diagnosis Wound #: Same as Pre-Procedure KISHAWN, HUTCHERSON ([id number]) [id number] Page 7 of 8 Plan Follow-up Appointments: Return Appointment in 1 week. Nurse Visit as needed Bathing/ Shower/ Hygiene: May shower with wound dressing protected with water repellent cover or cast protector. - pt advised to not get wrap wet No tub bath. Anesthetic (Use 'Patient Medications' Section for Anesthetic Order Entry): Lidocaine applied to wound bed Edema Control - Lymphedema / Segmental Compressive Device / Other: UrgoK2 LITE - size large used Elevate, Exercise Daily and Avoid Standing for Long Periods of Time. Elevate leg(s) parallel to the floor when sitting. DO YOUR BEST to sleep in the bed at night. DO NOT sleep in your recliner. Long hours of sitting in a recliner leads to swelling of the legs and/or potential wounds on your backside. Other: - discussed juxtalite for legs in future once swelling is down Non-Wound Condition: Apply appropriate compression. - urgo lite large used Additional non-wound orders/instructions: - AandD ointment to bilat legs prior to wrapping Medications-Please add to medication list.: P.O. Antibiotics - continue taking previously prescribed oral antibiotics WOUND #1: - Lower Leg Wound Laterality: Left, Midline, Proximal Cleanser: Soap and Water 2 x Per Week/30 Days Discharge Instructions: Gently cleanse wound with antibacterial soap, rinse and pat dry prior to dressing wounds Prim Dressing: Promogran Matrix 4.34 (in) 2 x Per Week/30 Days ary Discharge Instructions: Moisten w/normal saline or sterile water; Cover wound as directed. Do not remove from wound bed. Secondary Dressing: Zetuvit Plus 4x8 (in/in) 2 x Per Week/30 Days Com pression Wrap: Urgo K2 Lite, two layer compression system, regular 2 x Per Week/30 Days 1. I would  recommend that we have the patient continue to monitor for any signs of infection or worsening. Based on what I am seeing I do believe there were making good headway towards closure although we will get a switch at this point to collagen to try to speed this up. 2. I am then recommend as well that we continue with the Promogran along  with Zetuvit over top of this and working to use the [employer] K2 lite compression wrap. 3. Were to wrap the right leg as well as the there is no open wounds I think this is still good to do in order to make sure that we get his swelling under control so that we can get for him with the juxta fit compression wraps ordered. We will see patient back for reevaluation in 1 week here in the clinic. If anything worsens or changes patient will contact our office for additional recommendations. Electronic Signature(s) Signed: 03/16/2023 4:53:21 PM By: [name] PA-C Entered By: [name] on 03/16/2023 16:53:21 -------------------------------------------------------------------------------- SuperBill Details Patient Name: Date of Service: Joe Barry [name] 03/16/2023 Medical Record Number: [id number] Patient Account Number: [id number] Date of Birth/Sex: Treating RN: 05/07/1934 (89 y.o. Joe Barry Primary Care Provider: [name] Other Clinician: Referring Provider: Treating Provider/Extender: Kallie Edward in Treatment: 2 Diagnosis Coding ICD-10 Codes Code Description [phone] Laceration without foreign body, left lower leg, initial encounter L97.822 Non-pressure chronic ulcer of other part of left lower leg with fat layer exposed PARRY, LEGATE ([id number]) [id number] Page 8 of 8 I89.0 Lymphedema, not elsewhere classified E11.622 Type 2 diabetes mellitus with other skin ulcer Z79.84 Long term (current) use of oral hypoglycemic drugs I10 Essential (primary) hypertension I73.89 Other specified peripheral vascular  diseases Facility Procedures : CPT4 Code: [id number] Description: 11042 - DEB SUBQ TISSUE 20 SQ CM/< ICD-10 Diagnosis Description L97.822 Non-pressure chronic ulcer of other part of left lower leg with fat layer expo Modifier: sed Quantity: 1 Physician Procedures : CPT4 Code Description Modifier [id number] 11042 - WC PHYS SUBQ TISS 20 SQ CM ICD-10 Diagnosis Description L97.822 Non-pressure chronic ulcer of other part of left lower leg with fat layer exposed Quantity: 1 Electronic Signature(s) Signed: 03/16/2023 4:54:00 PM By: [name] PA-C Entered By: [name] on 03/16/2023 16:54:00

## 2023-03-23 ENCOUNTER — Encounter: Payer: Medicare PPO | Admitting: Physician Assistant

## 2023-03-23 DIAGNOSIS — S81812A Laceration without foreign body, left lower leg, initial encounter: Secondary | ICD-10-CM | POA: Diagnosis not present

## 2023-03-23 DIAGNOSIS — Z7984 Long term (current) use of oral hypoglycemic drugs: Secondary | ICD-10-CM | POA: Diagnosis not present

## 2023-03-23 DIAGNOSIS — I10 Essential (primary) hypertension: Secondary | ICD-10-CM | POA: Diagnosis not present

## 2023-03-23 DIAGNOSIS — I890 Lymphedema, not elsewhere classified: Secondary | ICD-10-CM | POA: Diagnosis not present

## 2023-03-23 DIAGNOSIS — L97822 Non-pressure chronic ulcer of other part of left lower leg with fat layer exposed: Secondary | ICD-10-CM | POA: Diagnosis not present

## 2023-03-23 DIAGNOSIS — E11622 Type 2 diabetes mellitus with other skin ulcer: Secondary | ICD-10-CM | POA: Diagnosis not present

## 2023-03-23 DIAGNOSIS — S81802A Unspecified open wound, left lower leg, initial encounter: Secondary | ICD-10-CM | POA: Diagnosis not present

## 2023-03-23 NOTE — Progress Notes (Signed)
YAHYE, SIEBERT ([id number]) [id number] Page 1 of 8 Visit Report for 03/23/2023 Chief Complaint Document Details Patient Name: Date of Service: [facility] R. 03/23/2023 12:00 PM Medical Record Number: [id number] Patient Account Number: [id number] Date of Birth/Sex: Treating RN: 1933-09-30 (89 y.o. Joe Barry Primary Care Provider: [name] Other Clinician: Referring Provider: Treating Provider/Extender: Kallie Edward in Treatment: 3 Information Obtained from: Patient Chief Complaint Left LE Ulcers Electronic Signature(s) Signed: 03/23/2023 12:29:27 PM By: [name] PA-C Entered By: [name] on 03/23/2023 12:29:26 -------------------------------------------------------------------------------- Debridement Details Patient Name: Date of Service: Joe Barry [name] 03/23/2023 12:00 PM Medical Record Number: [id number] Patient Account Number: [id number] Date of Birth/Sex: Treating RN: Sep 04, 1934 (89 y.o. Joe Barry Primary Care Provider: [name] Other Clinician: Referring Provider: Treating Provider/Extender: Kallie Edward in Treatment: 3 Debridement Performed for Assessment: Wound #1 Left,Proximal,Midline Lower Leg Performed By: Physician [name], PA-C Debridement Type: Debridement Level of Consciousness (Pre-procedure): Awake and Alert Pre-procedure Verification/Time Out Yes - 12:31 Taken: Pain Control: Lidocaine 4% T opical Solution Percent of Wound Bed Debrided: 100% T Area Debrided (cm): otal 3.32 Tissue and other material debrided: Viable, Non-Viable, Slough, Subcutaneous, Slough Level: Skin/Subcutaneous Tissue Debridement Description: Excisional Instrument: Curette Bleeding: Moderate Hemostasis Achieved: Pressure Response to Treatment: Procedure was tolerated well Level of Consciousness (Post- Awake and Alert procedure): ZIV, WELCHEL ([id number])  [id number] Page 2 of 8 Post Debridement Measurements of Total Wound Length: (cm) 4.7 Width: (cm) 0.9 Depth: (cm) 0.2 Volume: (cm) 0.664 Character of Wound/Ulcer Post Debridement: Stable Post Procedure Diagnosis Same as Pre-procedure Electronic Signature(s) Signed: 03/23/2023 5:13:20 PM By: [name] Signed: 03/23/2023 7:04:22 PM By: [name] PA-C Entered By: [name] on 03/23/2023 12:32:24 -------------------------------------------------------------------------------- HPI Details Patient Name: Date of Service: Joe Barry [name] 03/23/2023 12:00 PM Medical Record Number: [id number] Patient Account Number: [id number] Date of Birth/Sex: Treating RN: 1934-01-08 (89 y.o. Joe Barry Primary Care Provider: [name] Other Clinician: Referring Provider: Treating Provider/Extender: Kallie Edward in Treatment: 3 History of Present Illness HPI Description: 02-26-2023 upon evaluation today patient presents for initial inspection here in our clinic concerning issues that he has been having with a wound on his left anterior lower leg. This actually began on 01-14-2023 when the patient was getting out of the car and subsequently they were turning the wheelchair around to move around the door and got too close to the edge of the door and the corner at the bottom actually cut his leg. This because of his lymphedema has been a very hard wound to heal following. Fortunately there does not appear to be any signs of infection systemically which is good news. With that being said even locally I do not see any signs of infection at this point. I do believe that he is making fairly good progress all things considered although I think that we definitely need to clean some this out and get it moving in the right direction. Patient has been using Medihoney or rather his daughter has at home she is a Engineer, civil (consulting) she works in the ICU office seen her  previously as well as a patient. Subsequently after it was apparent that this was not getting better they did go on 7 June to [place] [facility] and at that point they actually put the patient on Augmentin which she started on 02-19-2023 and is still continuing that through current. His most recent hemoglobin A1c  was 6.9 on 01-16-2023. He did have an x-ray done of the leg but this has not been released yet it still pending a reading. Currently could not tolerate the ABI screening but he seems to have good perfusion in regard to his leg I do not know that we go to gotten a lot of a good reading anyway when it comes to the ABIs even if we were able to do the screening due to the amount of lymphedema that he has with associated swelling. Patient does have a history of lymphedema, diabetes mellitus type 2, he is on oral hypoglycemic agents. He also has a history of hypertension and peripheral vascular disease although his last ABI check in 2018 was normal according to his daughter and he has seen vascular again at that time but has not had any recent ABIs performed. 03-09-2023 upon evaluation today patient appears to be doing well currently in regard to his wounds. He has been tolerating the dressing changes without complication. Fortunately I see signs of definite improvement and very pleased in this regard. 03-16-2023 upon evaluation today patient appears to be doing well currently in regard to his wound which is actually showing signs of improvement. Fortunately I do not see any evidence of active infection locally nor systemically at this time. 03-23-2023 upon evaluation today patient appears to be doing well currently in regard to his wound. He has been tolerating the dressing changes without complication. The wound actually showed signs of excellent improvement which is great news and very pleased in that regard. Electronic Signature(s) Signed: 03/23/2023 6:35:29 PM By: [name] PA-C Entered By:  [name] on 03/23/2023 18:35:29 NAHSIR, VENEZIA ([id number]) [id number] Page 3 of 8 -------------------------------------------------------------------------------- Physical Exam Details Patient Name: Date of Service: Joe Barry [facility] R. 03/23/2023 12:00 PM Medical Record Number: [id number] Patient Account Number: [id number] Date of Birth/Sex: Treating RN: 07/14/1934 (89 y.o. Joe Barry Primary Care Provider: [name] Other Clinician: Referring Provider: Treating Provider/Extender: Kallie Edward in Treatment: 3 Constitutional Well-nourished and well-hydrated in no acute distress. Respiratory normal breathing without difficulty. Psychiatric this patient is able to make decisions and demonstrates good insight into disease process. Alert and Oriented x 3. pleasant and cooperative. Notes Upon inspection patient's wound bed showed evidence of good granulation epithelization at this point. Fortunately I see no signs infection or worsening overall and I think that he is making good headway towards closure. Electronic Signature(s) Signed: 03/23/2023 6:35:42 PM By: [name] PA-C Entered By: [name] on 03/23/2023 18:35:41 -------------------------------------------------------------------------------- Physician Orders Details Patient Name: Date of Service: Joe Barry [name] 03/23/2023 12:00 PM Medical Record Number: [id number] Patient Account Number: [id number] Date of Birth/Sex: Treating RN: 11-Nov-1933 (89 y.o. Joe Barry Primary Care Provider: [name] Other Clinician: Referring Provider: Treating Provider/Extender: Kallie Edward in Treatment: 3 Verbal / Phone Orders: No Diagnosis Coding ICD-10 Coding Code Description [phone] Laceration without foreign body, left lower leg, initial encounter L97.822 Non-pressure chronic ulcer of other part of left lower leg with fat layer exposed I89.0  Lymphedema, not elsewhere classified E11.622 Type 2 diabetes mellitus with other skin ulcer Z79.84 Long term (current) use of oral hypoglycemic drugs I10 Essential (primary) hypertension I73.89 Other specified peripheral vascular diseases AXTEN, PASCUCCI ([id number]) [id number] Page 4 of 8 Follow-up Appointments Return Appointment in 1 week. Nurse Visit as needed Bathing/ Shower/ Hygiene May shower with wound dressing protected with water repellent cover or cast protector. - pt  advised to not get wrap wet No tub bath. Anesthetic (Use 'Patient Medications' Section for Anesthetic Order Entry) Lidocaine applied to wound bed Edema Control - Lymphedema / Segmental Compressive Device / Other UrgoK2 LITE - size large used Elevate, Exercise Daily and A void Standing for Long Periods of Time. Elevate leg(s) parallel to the floor when sitting. DO YOUR BEST to sleep in the bed at night. DO NOT sleep in your recliner. Long hours of sitting in a recliner leads to swelling of the legs and/or potential wounds on your backside. Other: - discussed juxtalite for legs in future once swelling is down Non-Wound Condition Right Lower Extremity pply appropriate compression. - urgo lite large used A dditional non-wound orders/instructions: - AandD ointment to bilat legs prior to wrapping A Medications-Please add to medication list. ntibiotics - continue taking previously prescribed oral antibiotics P.O. A Wound Treatment Wound #1 - Lower Leg Wound Laterality: Left, Midline, Proximal Cleanser: Soap and Water 2 x Per Week/30 Days Discharge Instructions: Gently cleanse wound with antibacterial soap, rinse and pat dry prior to dressing wounds Prim Dressing: Promogran Matrix 4.34 (in) 2 x Per Week/30 Days ary Discharge Instructions: Moisten w/normal saline or sterile water; Cover wound as directed. Do not remove from wound bed. Secondary Dressing: Zetuvit Plus 4x8 (in/in) 2  x Per Week/30 Days Compression Wrap: Urgo K2 Lite, two layer compression system, regular 2 x Per Week/30 Days Electronic Signature(s) Signed: 03/23/2023 5:13:20 PM By: [name] Signed: 03/23/2023 7:04:22 PM By: [name] PA-C Entered By: [name] on 03/23/2023 12:31:37 -------------------------------------------------------------------------------- Problem List Details Patient Name: Date of Service: Joe Barry [name] 03/23/2023 12:00 PM Medical Record Number: [id number] Patient Account Number: [id number] Date of Birth/Sex: Treating RN: [birth year] (89 y.o. Joe Barry Primary Care Provider: [name] Other Clinician: Referring Provider: Treating Provider/Extender: Kallie Edward in Treatment: 3 Active Problems ICD-10 Encounter Code Description Active Date MDM Diagnosis [phone] Laceration without foreign body, left lower leg, initial encounter 02/26/2023 No Yes PAYMON, ROSENSTEEL ([id number]) [id number] Page 5 of 8 [phone] Non-pressure chronic ulcer of other part of left lower leg with fat layer exposed6/14/2024 No Yes I89.0 Lymphedema, not elsewhere classified 02/26/2023 No Yes E11.622 Type 2 diabetes mellitus with other skin ulcer 02/26/2023 No Yes Z79.84 Long term (current) use of oral hypoglycemic drugs 02/26/2023 No Yes I10 Essential (primary) hypertension 02/26/2023 No Yes I73.89 Other specified peripheral vascular diseases 02/26/2023 No Yes Inactive Problems Resolved Problems Electronic Signature(s) Signed: 03/23/2023 12:29:23 PM By: [name] PA-C Entered By: [name] on 03/23/2023 12:29:22 -------------------------------------------------------------------------------- Progress Note Details Patient Name: Date of Service: Joe Barry [name] 03/23/2023 12:00 PM Medical Record Number: [id number] Patient Account Number: [id number] Date of Birth/Sex: Treating RN: 1934-07-01 (89 y.o. Joe Barry Primary Care  Provider: [name] Other Clinician: Referring Provider: Treating Provider/Extender: Kallie Edward in Treatment: 3 Subjective Chief Complaint Information obtained from Patient Left LE Ulcers History of Present Illness (HPI) 02-26-2023 upon evaluation today patient presents for initial inspection here in our clinic concerning issues that he has been having with a wound on his left anterior lower leg. This actually began on 01-14-2023 when the patient was getting out of the car and subsequently they were turning the wheelchair around to move around the door and got too close to the edge of the door and the corner at the bottom actually cut his leg. This because of his lymphedema has been a very hard wound  to heal following. Fortunately there does not appear to be any signs of infection systemically which is good news. With that being said even locally I do not see any signs of infection at this point. I do believe that he is making fairly good progress all things considered although I think that we definitely need to clean some this out and get it moving in the right direction. Patient has been using Medih[employer] or rather his daughter has at home she is a Engineer, civil (consulting) she works in the ICU office seen her previously as well as a patient. Subsequently after it was apparent that this was not getting better they did go on 7 June to [place] [facility] and at that point they actually put the patient on Augmentin which she started on 02-19-2023 and is still continuing that through current. His most recent hemoglobin A1c was 6.9 on 01-16-2023. He did have an x-ray done of the leg but this has not been released yet it still pending a reading. Currently could not tolerate the ABI screening but he seems to have good perfusion in regard to his leg I do not know that we go to gotten a lot of a good reading anyway when it comes to the ABIs even if we were able to do the LAZARUS, SUDBURY ([id number])  [id number] Page 6 of 8 screening due to the amount of lymphedema that he has with associated swelling. Patient does have a history of lymphedema, diabetes mellitus type 2, he is on oral hypoglycemic agents. He also has a history of hypertension and peripheral vascular disease although his last ABI check in 2018 was normal according to his daughter and he has seen vascular again at that time but has not had any recent ABIs performed. 03-09-2023 upon evaluation today patient appears to be doing well currently in regard to his wounds. He has been tolerating the dressing changes without complication. Fortunately I see signs of definite improvement and very pleased in this regard. 03-16-2023 upon evaluation today patient appears to be doing well currently in regard to his wound which is actually showing signs of improvement. Fortunately I do not see any evidence of active infection locally nor systemically at this time. 03-23-2023 upon evaluation today patient appears to be doing well currently in regard to his wound. He has been tolerating the dressing changes without complication. The wound actually showed signs of excellent improvement which is great news and very pleased in that regard. Objective Constitutional Well-nourished and well-hydrated in no acute distress. Vitals Time Taken: 12:11 PM, Height: 65 in, Weight: 180 lbs, BMI: 30, Temperature: 97.8 F, Pulse: 79 bpm, Respiratory Rate: 18 breaths/min, Blood Pressure: 154/76 mmHg. Respiratory normal breathing without difficulty. Psychiatric this patient is able to make decisions and demonstrates good insight into disease process. Alert and Oriented x 3. pleasant and cooperative. General Notes: Upon inspection patient's wound bed showed evidence of good granulation epithelization at this point. Fortunately I see no signs infection or worsening overall and I think that he is making good headway towards  closure. Integumentary (Hair, Skin) Wound #1 status is Open. Original cause of wound was Trauma. The date acquired was: 01/14/2023. The wound has been in treatment 3 weeks. The wound is located on the Left,Proximal,Midline Lower Leg. The wound measures 4.7cm length x 0.9cm width x 0.2cm depth; 3.322cm^2 area and 0.664cm^3 volume. There is Fat Layer (Subcutaneous Tissue) exposed. There is no tunneling or undermining noted. There is a medium amount of serosanguineous drainage noted. There  is medium (34-66%) red granulation within the wound bed. There is a medium (34-66%) amount of necrotic tissue within the wound bed including Adherent Slough. Assessment Active Problems ICD-10 Laceration without foreign body, left lower leg, initial encounter Non-pressure chronic ulcer of other part of left lower leg with fat layer exposed Lymphedema, not elsewhere classified Type 2 diabetes mellitus with other skin ulcer Long term (current) use of oral hypoglycemic drugs Essential (primary) hypertension Other specified peripheral vascular diseases Procedures Wound #1 Pre-procedure diagnosis of Wound #1 is a Trauma, Other located on the Left,Proximal,Midline Lower Leg . There was a Excisional Skin/Subcutaneous Tissue Debridement with a total area of 3.32 sq cm performed by [name], PA-C. With the following instrument(s): Curette to remove Viable and Non-Viable tissue/material. Material removed includes Subcutaneous Tissue and Slough and after achieving pain control using Lidocaine 4% T opical Solution. No specimens were taken. A time out was conducted at 12:31, prior to the start of the procedure. A Moderate amount of bleeding was controlled with Pressure. The procedure was tolerated well. Post Debridement Measurements: 4.7cm length x 0.9cm width x 0.2cm depth; 0.664cm^3 volume. Character of Wound/Ulcer Post Debridement is stable. Post procedure Diagnosis Wound #1: Same as Pre-Procedure Plan TREVAR, BOEHRINGER  ([id number]) [id number] Page 7 of 8 Follow-up Appointments: Return Appointment in 1 week. Nurse Visit as needed Bathing/ Shower/ Hygiene: May shower with wound dressing protected with water repellent cover or cast protector. - pt advised to not get wrap wet No tub bath. Anesthetic (Use 'Patient Medications' Section for Anesthetic Order Entry): Lidocaine applied to wound bed Edema Control - Lymphedema / Segmental Compressive Device / Other: UrgoK2 LITE - size large used Elevate, Exercise Daily and Avoid Standing for Long Periods of Time. Elevate leg(s) parallel to the floor when sitting. DO YOUR BEST to sleep in the bed at night. DO NOT sleep in your recliner. Long hours of sitting in a recliner leads to swelling of the legs and/or potential wounds on your backside. Other: - discussed juxtalite for legs in future once swelling is down Non-Wound Condition: Apply appropriate compression. - urgo lite large used Additional non-wound orders/instructions: - AandD ointment to bilat legs prior to wrapping Medications-Please add to medication list.: P.O. Antibiotics - continue taking previously prescribed oral antibiotics WOUND #1: - Lower Leg Wound Laterality: Left, Midline, Proximal Cleanser: Soap and Water 2 x Per Week/30 Days Discharge Instructions: Gently cleanse wound with antibacterial soap, rinse and pat dry prior to dressing wounds Prim Dressing: Promogran Matrix 4.34 (in) 2 x Per Week/30 Days ary Discharge Instructions: Moisten w/normal saline or sterile water; Cover wound as directed. Do not remove from wound bed. Secondary Dressing: Zetuvit Plus 4x8 (in/in) 2 x Per Week/30 Days Com pression Wrap: Urgo K2 Lite, two layer compression system, regular 2 x Per Week/30 Days 1. I would recommend that we have the patient continue with the silver collagen which I think is still doing a good job here. 2. Also can recommend that he should continue to monitor  for any signs of infection or worsening. We are going to continue with the Urgo K2 lite compression wrap. We will see patient back for reevaluation in 1 week here in the clinic. If anything worsens or changes patient will contact our office for additional recommendations. Electronic Signature(s) Signed: 03/23/2023 6:36:07 PM By: [name] PA-C Entered By: [name] on 03/23/2023 18:36:07 -------------------------------------------------------------------------------- SuperBill Details Patient Name: Date of Service: Delynn Flavin R. 03/23/2023 Medical Record Number: [id number] Patient  Account Number: [id number] Date of Birth/Sex: Treating RN: 11-21-1933 (89 y.o. Joe Barry Primary Care Provider: [name] Other Clinician: Referring Provider: Treating Provider/Extender: Kallie Edward in Treatment: 3 Diagnosis Coding ICD-10 Codes Code Description [phone] Laceration without foreign body, left lower leg, initial encounter L97.822 Non-pressure chronic ulcer of other part of left lower leg with fat layer exposed I89.0 Lymphedema, not elsewhere classified E11.622 Type 2 diabetes mellitus with other skin ulcer Z79.84 Long term (current) use of oral hypoglycemic drugs I10 Essential (primary) hypertension I73.89 Other specified peripheral vascular diseases HATCHER, FRONING ([id number]) [id number] Page 8 of 8 Facility Procedures : CPT4 Code: [id number] Description: 11042 - DEB SUBQ TISSUE 20 SQ CM/< ICD-10 Diagnosis Description L97.822 Non-pressure chronic ulcer of other part of left lower leg with fat layer expo Modifier: sed Quantity: 1 Physician Procedures : CPT4 Code Description Modifier [id number] 11042 - WC PHYS SUBQ TISS 20 SQ CM ICD-10 Diagnosis Description L97.822 Non-pressure chronic ulcer of other part of left lower leg with fat layer exposed Quantity: 1 Electronic Signature(s) Signed: 03/23/2023 6:36:25 PM By: [name]  PA-C Entered By: [name] on 03/23/2023 18:36:25

## 2023-03-23 NOTE — Progress Notes (Signed)
Joe Barry, Joe Barry ([id number]) [id number] Page 1 of 9 Visit Report for 03/23/2023 Arrival Information Details Patient Name: Date of Service: Joe [name] 03/23/2023 12:00 PM Medical Record Number: [id number] Patient Account Number: [id number] Date of Birth/Sex: Treating RN: 08/13/1934 (89 y.o. [name] Primary Care Taygen Acklin: [name] Other Clinician: Referring Linh Johannes: Treating Ojas Coone/Extender: Kallie Edward in Treatment: 3 Visit Information History Since Last Visit Added or deleted any medications: No Patient Arrived: Wheel Chair Any new allergies or adverse reactions: No Arrival Time: 12:11 Had a fall or experienced change in No Accompanied By: daughter activities of daily living that may affect Transfer Assistance: EasyPivot Patient Lift risk of falls: Patient Identification Verified: Yes Hospitalized since last visit: No Secondary Verification Process Completed: Yes Has Dressing in Place as Prescribed: Yes Patient Requires Transmission-Based Precautions: No Has Compression in Place as Prescribed: Yes Patient Has Alerts: No Pain Present Now: No Electronic Signature(s) Signed: 03/23/2023 5:13:20 PM By: [name] Entered By: [name] on 03/23/2023 12:11:25 -------------------------------------------------------------------------------- Clinic Level of Care Assessment Details Patient Name: Date of Service: Joe Artis [facility] [name] 03/23/2023 12:00 PM Medical Record Number: [id number] Patient Account Number: [id number] Date of Birth/Sex: Treating RN: 11-20-1933 (89 y.o. [name] Primary Care Hiilani Jetter: [name] Other Clinician: Referring Ovidio Steele: Treating Hameed Kolar/Extender: Kallie Edward in Treatment: 3 Clinic Level of Care Assessment Items TOOL 1 Quantity Score []  - 0 Use when EandM and Procedure is performed on INITIAL visit ASSESSMENTS - Nursing Assessment /  Reassessment []  - 0 General Physical Exam (combine w/ comprehensive assessment (listed just below) when performed on new pt. evals) []  - 0 Comprehensive Assessment (HX, ROS, Risk Assessments, Wounds Hx, etc.) ASSESSMENTS - Wound and Skin Assessment / Reassessment []  - 0 Dermatologic / Skin Assessment (not related to wound area) MARVYN, TORREZ ([id number]) [id number] Page 2 of 9 ASSESSMENTS - Ostomy and/or Continence Assessment and Care []  - 0 Incontinence Assessment and Management []  - 0 Ostomy Care Assessment and Management (repouching, etc.) PROCESS - Coordination of Care []  - 0 Simple Patient / Family Education for ongoing care []  - 0 Complex (extensive) Patient / Family Education for ongoing care []  - 0 Staff obtains Chiropractor, Records, T Results / Process Orders est []  - 0 Staff telephones HHA, Nursing Homes / Clarify orders / etc []  - 0 Routine Transfer to another Facility (non-emergent condition) []  - 0 Routine Hospital Admission (non-emergent condition) []  - 0 New Admissions / Manufacturing engineer / Ordering NPWT Apligraf, etc. , []  - 0 Emergency Hospital Admission (emergent condition) PROCESS - Special Needs []  - 0 Pediatric / Minor Patient Management []  - 0 Isolation Patient Management []  - 0 Hearing / Language / Visual special needs []  - 0 Assessment of Community assistance (transportation, D/C planning, etc.) []  - 0 Additional assistance / Altered mentation []  - 0 Support Surface(s) Assessment (bed, cushion, seat, etc.) INTERVENTIONS - Miscellaneous []  - 0 External ear exam []  - 0 Patient Transfer (multiple staff / Nurse, adult / Similar devices) []  - 0 Simple Staple / Suture removal (25 or less) []  - 0 Complex Staple / Suture removal (26 or more) []  - 0 Hypo/Hyperglycemic Management (do not check if billed separately) []  - 0 Ankle / Brachial Index (ABI) - do not check if billed separately Has the patient been seen at  the hospital within the last three years: Yes Total Score: 0 Level Of Care: ____ Electronic Signature(s) Signed: 03/23/2023 5:13:20 PM  By: [name] Entered By: [name] on 03/23/2023 12:32:32 -------------------------------------------------------------------------------- Encounter Discharge Information Details Patient Name: Date of Service: Joe Artis [facility] [name] 03/23/2023 12:00 PM Medical Record Number: [id number] Patient Account Number: [id number] Date of Birth/Sex: Treating RN: 1934-03-16 (89 y.o. [name] Primary Care Manasvini Whatley: [name] Other Clinician: Referring Deztiny Sarra: Treating Tenaya Hilyer/Extender: Kallie Edward in Treatment: 3 Encounter Discharge Information Items Post Procedure MERRIC, YOST ([id number]) [id number] Page 3 of 9 Discharge Condition: Stable Temperature (F): 97.8 Ambulatory Status: Wheelchair Pulse (bpm): 79 Discharge Destination: Home Respiratory Rate (breaths/min): 18 Transportation: Private Auto Blood Pressure (mmHg): 154/76 Accompanied By: daughter Schedule Follow-up Appointment: Yes Clinical Summary of Care: Electronic Signature(s) Signed: 03/23/2023 5:13:20 PM By: [name] Entered By: [name] on 03/23/2023 12:33:09 -------------------------------------------------------------------------------- Lower Extremity Assessment Details Patient Name: Date of Service: Joe Artis [facility] [name] 03/23/2023 12:00 PM Medical Record Number: [id number] Patient Account Number: [id number] Date of Birth/Sex: Treating RN: 11/23/1933 (89 y.o. [name] Primary Care Laurynn Mccorvey: [name] Other Clinician: Referring Cindy Brindisi: Treating Shomari Scicchitano/Extender: Kallie Edward in Treatment: 3 Edema Assessment Assessed: [Left: No] [Right: No] Edema: [Left: Yes] [Right: Yes] Calf Left: Right: Point of Measurement: 30 cm From Medial Instep 41.8 cm 41 cm Ankle Left:  Right: Point of Measurement: 11 cm From Medial Instep 29.2 cm 30 cm Vascular Assessment Pulses: Dorsalis Pedis Doppler Audible: [Left:Yes] [Right:Yes] Posterior Tibial Doppler Audible: [Left:Yes] [Right:Yes] Electronic Signature(s) Signed: 03/23/2023 5:13:20 PM By: [name] Entered By: [name] on 03/23/2023 12:21:49 CAILAN, GENERAL ([id number]) [id number] Page 4 of 9 -------------------------------------------------------------------------------- Multi Wound Chart Details Patient Name: Date of Service: Joe Artis [facility] [name] 03/23/2023 12:00 PM Medical Record Number: [id number] Patient Account Number: [id number] Date of Birth/Sex: Treating RN: 1934-06-08 (89 y.o. [name] Primary Care Evelyn Moch: [name] Other Clinician: Referring Aviyah Swetz: Treating Adrik Khim/Extender: Kallie Edward in Treatment: 3 Vital Signs Height(in): 65 Pulse(bpm): 79 Weight(lbs): 180 Blood Pressure(mmHg): 154/76 Body Mass Index(BMI): 30 Temperature(F): 97.8 Respiratory Rate(breaths/min): 18 [1:Photos:] [N/A:N/A] Left, Proximal, Midline Lower Leg N/A N/A Wound Location: Trauma N/A N/A Wounding Event: Trauma, Other N/A N/A Primary Etiology: Cataracts, Lymphedema, N/A N/A Comorbid History: Hypertension, Peripheral Venous Disease, Type II Diabetes 01/14/2023 N/A N/A Date Acquired: 3 N/A N/A Weeks of Treatment: Open N/A N/A Wound Status: No N/A N/A Wound Recurrence: 4.7x0.9x0.2 N/A N/A Measurements L x W x D (cm) 3.322 N/A N/A A (cm) : rea 0.664 N/A N/A Volume (cm) : 69.20% N/A N/A % Reduction in Area: 79.50% N/A N/A % Reduction in Volume: Full Thickness Without Exposed N/A N/A Classification: Support Structures Medium N/A N/A Exudate A mount: Serosanguineous N/A N/A Exudate Type: red, brown N/A N/A Exudate Color: Medium (34-66%) N/A N/A Granulation A mount: Red N/A N/A Granulation Quality: Medium (34-66%) N/A  N/A Necrotic A mount: Fat Layer (Subcutaneous Tissue): Yes N/A N/A Exposed Structures: Small (1-33%) N/A N/A Epithelialization: Treatment Notes Electronic Signature(s) Signed: 03/23/2023 12:27:00 PM By: [name] Entered By: [name] on 03/23/2023 12:27:00 [name] ([id number]) [id number] Page 5 of 9 -------------------------------------------------------------------------------- Multi-Disciplinary Care Plan Details Patient Name: Date of Service: Joe Artis [facility] [name] 03/23/2023 12:00 PM Medical Record Number: [id number] Patient Account Number: [id number] Date of Birth/Sex: Treating RN: 1934/07/21 (89 y.o. [name] Primary Care Tucker Steedley: [name] Other Clinician: Referring Ziquan Fidel: Treating Stefany Starace/Extender: Kallie Edward in Treatment: 3 Active Inactive Venous Leg Ulcer Nursing Diagnoses: Knowledge deficit related  to disease process and management Potential for venous Insuffiency (use before diagnosis confirmed) Goals: Patient will maintain optimal edema control Date Initiated: 02/26/2023 Target Resolution Date: 04/23/2023 Goal Status: Active Patient/caregiver will verbalize understanding of disease process and disease management Date Initiated: 02/26/2023 Date Inactivated: 02/26/2023 Target Resolution Date: 02/26/2023 Goal Status: Met Interventions: Assess peripheral edema status every visit. Compression as ordered Treatment Activities: Therapeutic compression applied : 02/26/2023 Notes: Wound/Skin Impairment Nursing Diagnoses: Impaired tissue integrity Knowledge deficit related to ulceration/compromised skin integrity Goals: Ulcer/skin breakdown will have a volume reduction of 30% by week 4 Date Initiated: 02/26/2023 Date Inactivated: 03/23/2023 Target Resolution Date: 03/26/2023 Goal Status: Met Ulcer/skin breakdown will have a volume reduction of 50% by week 8 Date Initiated: 02/26/2023 Target  Resolution Date: 04/23/2023 Goal Status: Active Ulcer/skin breakdown will have a volume reduction of 80% by week 12 Date Initiated: 02/26/2023 Target Resolution Date: 05/21/2023 Goal Status: Active Ulcer/skin breakdown will heal within 14 weeks Date Initiated: 02/26/2023 Target Resolution Date: 06/04/2023 Goal Status: Active Interventions: Assess patient/caregiver ability to obtain necessary supplies Assess patient/caregiver ability to perform ulcer/skin care regimen upon admission and as needed Assess ulceration(s) every visit Provide education on ulcer and skin care Treatment Activities: Skin care regimen initiated : 02/26/2023 GREELY, ATIYEH ([id number]) [phone].pdf Page 6 of 9 Notes: Electronic Signature(s) Signed: 03/23/2023 12:27:26 PM By: [name] Entered By: [name] on 03/23/2023 12:27:26 -------------------------------------------------------------------------------- Pain Assessment Details Patient Name: Date of Service: Joe Artis [facility] [name] 03/23/2023 12:00 PM Medical Record Number: [id number] Patient Account Number: [id number] Date of Birth/Sex: Treating RN: 1934/06/07 (89 y.o. [name] Primary Care Evva Din: [name] Other Clinician: Referring Jerre Diguglielmo: Treating Samit Sylve/Extender: Kallie Edward in Treatment: 3 Active Problems Location of Pain Severity and Description of Pain Patient Has Paino No Site Locations Rate the pain. Current Pain Level: 0 Pain Management and Medication Current Pain Management: Electronic Signature(s) Signed: 03/23/2023 5:13:20 PM By: [name] Entered By: [name] on 03/23/2023 12:12:10 KATHRYN, LINAREZ ([id number]) [id number] Page 7 of 9 -------------------------------------------------------------------------------- Patient/Caregiver Education Details Patient Name: Date of Service: Nigel Sloop. 7/9/2024andnbsp12:00 PM Medical Record  Number: [id number] Patient Account Number: [id number] Date of Birth/Gender: Treating RN: 1934/05/01 (89 y.o. [name] Primary Care Physician: [name] Other Clinician: Referring Physician: Treating Physician/Extender: Kallie Edward in Treatment: 3 Education Assessment Education Provided To: Patient Education Topics Provided Wound/Skin Impairment: Handouts: Caring for Your Ulcer Methods: Explain/Verbal Responses: State content correctly Electronic Signature(s) Signed: 03/23/2023 5:13:20 PM By: [name] Entered By: [name] on 03/23/2023 12:27:36 -------------------------------------------------------------------------------- Wound Assessment Details Patient Name: Date of Service: Joe Artis [facility] [name] 03/23/2023 12:00 PM Medical Record Number: [id number] Patient Account Number: [id number] Date of Birth/Sex: Treating RN: [birth year] (89 y.o. [name] Primary Care Azusena Erlandson: [name] Other Clinician: Referring Jimmi Sidener: Treating Antonique Langford/Extender: Kallie Edward in Treatment: 3 Wound Status Wound Number: 1 Primary Trauma, Other Etiology: Wound Location: Left, Proximal, Midline Lower Leg Wound Open Wounding Event: Trauma Status: Date Acquired: 01/14/2023 Comorbid Cataracts, Lymphedema, Hypertension, Peripheral Venous Weeks Of Treatment: 3 History: Disease, Type II Diabetes Clustered Wound: No Photos Wound Measurements Length: (cm) 4.7 ZAILYN, ROWSER Barry ([id number]) Width: (cm) 0.9 Depth: (cm) 0.2 Area: (cm) 3.322 Volume: (cm) 0.664 % Reduction in Area: 69.2% [id number] Page 8 of 9 % Reduction in Volume: 79.5% Epithelialization: Small (1-33%) Tunneling: No Undermining: No Wound Description Classification: Full Thickness Without Exposed Sup Exudate Amount: Medium Exudate Type:  Serosanguineous Exudate Color: red, brown port Structures Foul Odor After Cleansing:  No Slough/Fibrino Yes Wound Bed Granulation Amount: Medium (34-66%) Exposed Structure Granulation Quality: Red Fat Layer (Subcutaneous Tissue) Exposed: Yes Necrotic Amount: Medium (34-66%) Necrotic Quality: Adherent Slough Treatment Notes Wound #1 (Lower Leg) Wound Laterality: Left, Midline, Proximal Cleanser Soap and Water Discharge Instruction: Gently cleanse wound with antibacterial soap, rinse and pat dry prior to dressing wounds Peri-Wound Care Topical Primary Dressing Promogran Matrix 4.34 (in) Discharge Instruction: Moisten w/normal saline or sterile water; Cover wound as directed. Do not remove from wound bed. Secondary Dressing Zetuvit Plus 4x8 (in/in) Secured With Compression Wrap Urgo K2 Lite, two layer compression system, regular Compression Stockings Add-Ons Electronic Signature(s) Signed: 03/23/2023 5:13:20 PM By: [name] Entered By: [name] on 03/23/2023 12:24:14 -------------------------------------------------------------------------------- Vitals Details Patient Name: Date of Service: Joe Artis HN [name] 03/23/2023 12:00 PM Medical Record Number: [id number] Patient Account Number: [id number] Date of Birth/Sex: Treating RN: 08/05/1934 (89 y.o. [name] Primary Care Aashka Salomone: [name] Other Clinician: Referring Mattox Schorr: Treating Tarahji Ramthun/Extender: Kallie Edward in Treatment: 3 Vital Signs Time Taken: 12:11 Temperature (F): 97.8 Height (in): 65 Pulse (bpm): 79 Weight (lbs): 180 Respiratory Rate (breaths/min): [address] Barry ([id number]) [id number] Page 9 of 9 Body Mass Index (BMI): 30 Blood Pressure (mmHg): 154/76 Reference Range: 80 - 120 mg / dl Electronic Signature(s) Signed: 03/23/2023 5:13:20 PM By: [name] Entered By: [name] on 03/23/2023 12:11:50

## 2023-03-24 DIAGNOSIS — I890 Lymphedema, not elsewhere classified: Secondary | ICD-10-CM | POA: Diagnosis not present

## 2023-03-30 ENCOUNTER — Encounter: Payer: Medicare PPO | Admitting: Physician Assistant

## 2023-03-30 DIAGNOSIS — I10 Essential (primary) hypertension: Secondary | ICD-10-CM | POA: Diagnosis not present

## 2023-03-30 DIAGNOSIS — S81812A Laceration without foreign body, left lower leg, initial encounter: Secondary | ICD-10-CM | POA: Diagnosis not present

## 2023-03-30 DIAGNOSIS — I890 Lymphedema, not elsewhere classified: Secondary | ICD-10-CM | POA: Diagnosis not present

## 2023-03-30 DIAGNOSIS — E11622 Type 2 diabetes mellitus with other skin ulcer: Secondary | ICD-10-CM | POA: Diagnosis not present

## 2023-03-30 DIAGNOSIS — S81802A Unspecified open wound, left lower leg, initial encounter: Secondary | ICD-10-CM | POA: Diagnosis not present

## 2023-03-30 DIAGNOSIS — L97822 Non-pressure chronic ulcer of other part of left lower leg with fat layer exposed: Secondary | ICD-10-CM | POA: Diagnosis not present

## 2023-03-30 DIAGNOSIS — Z7984 Long term (current) use of oral hypoglycemic drugs: Secondary | ICD-10-CM | POA: Diagnosis not present

## 2023-03-30 NOTE — Progress Notes (Signed)
HADI, DUBIN ([id number]) [id number] Page 1 of 8 Visit Report for 03/30/2023 Chief Complaint Document Details Patient Name: Date of Service: Joe Barry [facility] R. 03/30/2023 10:15 A M Medical Record Number: [id number] Patient Account Number: [id number] Date of Birth/Sex: Treating RN: September 23, 1933 (89 y.o. Joe Barry Primary Care Provider: [name] Other Clinician: Referring Provider: Treating Provider/Extender: Kallie Edward in Treatment: 4 Information Obtained from: Patient Chief Complaint Left LE Ulcers Electronic Signature(s) Signed: 03/30/2023 10:19:02 AM By: [name] PA-C Entered By: [name] on 03/30/2023 10:19:01 -------------------------------------------------------------------------------- Debridement Details Patient Name: Date of Service: Joe Barry [name] 03/30/2023 10:15 A M Medical Record Number: [id number] Patient Account Number: [id number] Date of Birth/Sex: Treating RN: 1933/10/17 (89 y.o. Joe Barry Primary Care Provider: [name] Other Clinician: Referring Provider: Treating Provider/Extender: Kallie Edward in Treatment: 4 Debridement Performed for Assessment: Wound #1 Left,Proximal,Midline Lower Leg Performed By: Physician [name], PA-C Debridement Type: Debridement Level of Consciousness (Pre-procedure): Awake and Alert Pre-procedure Verification/Time Out Yes - 10:51 Taken: Start Time: 10:51 Pain Control: Lidocaine 4% T opical Solution Percent of Wound Bed Debrided: 100% T Area Debrided (cm): otal 1.57 Tissue and other material debrided: Viable, Non-Viable, Slough, Subcutaneous, Slough Level: Skin/Subcutaneous Tissue Debridement Description: Excisional Instrument: Curette Bleeding: Minimum Hemostasis Achieved: Pressure Procedural Pain: 0 Post Procedural Pain: 0 Response to Treatment: Procedure was tolerated well TATEM, FESLER ([id number])  [id number] Page 2 of 8 Level of Consciousness (Post- Awake and Alert procedure): Post Debridement Measurements of Total Wound Length: (cm) 4 Width: (cm) 0.5 Depth: (cm) 0.2 Volume: (cm) 0.314 Character of Wound/Ulcer Post Debridement: Stable Post Procedure Diagnosis Same as Pre-procedure Electronic Signature(s) Signed: 03/30/2023 2:53:57 PM By: Midge Aver MSN RN CNS WTA Signed: 03/30/2023 5:59:11 PM By: [name] PA-C Entered By: Midge Aver on 03/30/2023 10:51:53 -------------------------------------------------------------------------------- HPI Details Patient Name: Date of Service: Joe Barry [name] 03/30/2023 10:15 A M Medical Record Number: [id number] Patient Account Number: [id number] Date of Birth/Sex: Treating RN: 03-24-1934 (89 y.o. Joe Barry Primary Care Provider: [name] Other Clinician: Referring Provider: Treating Provider/Extender: Kallie Edward in Treatment: 4 History of Present Illness HPI Description: 02-26-2023 upon evaluation today patient presents for initial inspection here in our clinic concerning issues that he has been having with a wound on his left anterior lower leg. This actually began on 01-14-2023 when the patient was getting out of the car and subsequently they were turning the wheelchair around to move around the door and got too close to the edge of the door and the corner at the bottom actually cut his leg. This because of his lymphedema has been a very hard wound to heal following. Fortunately there does not appear to be any signs of infection systemically which is good news. With that being said even locally I do not see any signs of infection at this point. I do believe that he is making fairly good progress all things considered although I think that we definitely need to clean some this out and get it moving in the right direction. Patient has been using Medihoney or rather his daughter  has at home she is a Engineer, civil (consulting) she works in the ICU office seen her previously as well as a patient. Subsequently after it was apparent that this was not getting better they did go on 7 June to [place] [facility] and at that point they actually put the patient on Augmentin  which she started on 02-19-2023 and is still continuing that through current. His most recent hemoglobin A1c was 6.9 on 01-16-2023. He did have an x-ray done of the leg but this has not been released yet it still pending a reading. Currently could not tolerate the ABI screening but he seems to have good perfusion in regard to his leg I do not know that we go to gotten a lot of a good reading anyway when it comes to the ABIs even if we were able to do the screening due to the amount of lymphedema that he has with associated swelling. Patient does have a history of lymphedema, diabetes mellitus type 2, he is on oral hypoglycemic agents. He also has a history of hypertension and peripheral vascular disease although his last ABI check in 2018 was normal according to his daughter and he has seen vascular again at that time but has not had any recent ABIs performed. 03-09-2023 upon evaluation today patient appears to be doing well currently in regard to his wounds. He has been tolerating the dressing changes without complication. Fortunately I see signs of definite improvement and very pleased in this regard. 03-16-2023 upon evaluation today patient appears to be doing well currently in regard to his wound which is actually showing signs of improvement. Fortunately I do not see any evidence of active infection locally nor systemically at this time. 03-23-2023 upon evaluation today patient appears to be doing well currently in regard to his wound. He has been tolerating the dressing changes without complication. The wound actually showed signs of excellent improvement which is great news and very pleased in that regard. 03-30-2023 upon evaluation  today patient appears to be doing well currently in regard to his wound which is actually showing signs of significant improvement. Fortunately there does not appear to be any signs of active infection locally or systemically which is great news. No fevers, chills, nausea, vomiting, or diarrhea. Electronic Signature(s) Signed: 03/31/2023 4:30:19 PM By: [name] PA-C Entered By: [name] on 03/31/2023 16:30:19 DMONI, FORTSON ([id number]) [id number] Page 3 of 8 -------------------------------------------------------------------------------- Physical Exam Details Patient Name: Date of Service: Joe Barry [place] R. 03/30/2023 10:15 A M Medical Record Number: [id number] Patient Account Number: [id number] Date of Birth/Sex: Treating RN: Dec 20, 1933 (89 y.o. Joe Barry Primary Care Provider: [name] Other Clinician: Referring Provider: Treating Provider/Extender: Kallie Edward in Treatment: 4 Constitutional Well-nourished and well-hydrated in no acute distress. Respiratory normal breathing without difficulty. Psychiatric this patient is able to make decisions and demonstrates good insight into disease process. Alert and Oriented x 3. pleasant and cooperative. Notes Upon inspection patient's wound bed actually showed signs of good granulation and epithelization at this point. Fortunately I do not see any signs of worsening in general I do believe that the patient is making good headway towards complete closure which is excellent news as well. Electronic Signature(s) Signed: 03/31/2023 4:30:41 PM By: [name] PA-C Entered By: [name] on 03/31/2023 16:30:41 -------------------------------------------------------------------------------- Physician Orders Details Patient Name: Date of Service: Joe Barry [name] 03/30/2023 10:15 A M Medical Record Number: [id number] Patient Account Number: [id number] Date of Birth/Sex: Treating  RN: [birth year] (89 y.o. 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 Nurse Visit as needed Bathing/ Shower/ Hygiene May shower with wound dressing protected with water repellent cover or cast protector. - pt advised to not get wrap wet No tub bath. Anesthetic (Use 'Patient Medications' Section for Anesthetic Order Entry) Lidocaine applied to wound bed Edema Control - Lymphedema / Segmental Compressive Device / Other UrgoK2 LITE - size large used left leg Juxta Fit right leg Elevate, Exercise Daily and A void Standing for Long Periods of Time. Elevate leg(s) parallel to the floor when sitting. DO YOUR BEST to sleep in the bed at night. DO NOT sleep in your recliner. Long hours of sitting in a recliner leads to swelling of the legs and/or potential wounds on your backside. Other: - discussed juxtalite for legs in future once swelling is down Non-Wound Condition Right Lower Extremity pply appropriate compression. - urgo lite large used A dditional non-wound orders/instructions: - AandD ointment to bilat legs prior to wrapping A Medications-Please add to medication list. ntibiotics - continue taking previously prescribed oral antibiotics P.O.  A Wound Treatment Wound #1 - Lower Leg Wound Laterality: Left, Midline, Proximal Cleanser: Soap and Water 2 x Per Week/30 Days Discharge Instructions: Gently cleanse wound with antibacterial soap, rinse and pat dry prior to dressing wounds Prim Dressing: Promogran Matrix 4.34 (in) 2 x Per Week/30 Days ary Discharge Instructions: Moisten w/normal saline or sterile water; Cover wound as directed. Do not remove from wound bed. Secondary Dressing: Zetuvit Plus 4x8 (in/in) 2 x Per Week/30 Days Compression Wrap: Urgo K2 Lite, two layer compression system, regular 2 x Per Week/30 Days Electronic Signature(s) Signed: 03/30/2023 2:53:57 PM By: Midge Aver MSN RN CNS WTA Signed: 03/30/2023 5:59:11 PM By: [name] PA-C Entered By: Midge Aver on 03/30/2023 10:53:07 -------------------------------------------------------------------------------- Problem List Details Patient Name: Date of Service: Joe Barry [name] 03/30/2023 10:15 A M Medical Record Number: [id number] Patient Account Number: [id number] Date of Birth/Sex: Treating RN: 01/26/1934 (89 y.o. Joe Barry Primary Care Provider: [name] Other Clinician: Referring Provider: Treating Provider/Extender: Kallie Edward in Treatment: 4 Active Problems ICD-10 Encounter DEMONDRE, AGUAS ([id number]) [id number] Page 5 of 8 Encounter Code Description Active Date MDM Diagnosis S81.812A Laceration without foreign body, left lower leg, initial encounter 02/26/2023 No Yes L97.822 Non-pressure chronic ulcer of other part of left lower leg with fat layer exposed6/14/2024 No Yes I89.0 Lymphedema, not elsewhere classified 02/26/2023 No Yes E11.622 Type 2 diabetes mellitus with other skin ulcer 02/26/2023 No Yes Z79.84 Long term (current) use of oral hypoglycemic drugs 02/26/2023 No Yes I10 Essential (primary) hypertension 02/26/2023 No Yes I73.89 Other specified peripheral vascular diseases  02/26/2023 No Yes Inactive Problems Resolved Problems Electronic Signature(s) Signed: 03/30/2023 2:53:57 PM By: Midge Aver MSN RN CNS WTA Signed: 03/30/2023 5:59:11 PM By: [name] PA-C Previous Signature: 03/30/2023 10:18:55 AM Version By: [name] PA-C Entered By: Midge Aver on 03/30/2023 10:53:59 -------------------------------------------------------------------------------- Progress Note Details Patient Name: Date of Service: Joe Barry [name] 03/30/2023 10:15 A M Medical Record Number: [id number] Patient Account Number: [id number] Date of Birth/Sex: Treating RN: Aug 23, 1934 (89 y.o. Joe Barry Primary Care Provider: [name] Other  Clinician: Referring Provider: Treating Provider/Extender: Kallie Edward in Treatment: 4 Subjective Chief Complaint Information obtained from Patient Left LE Ulcers History of Present Illness (HPI) 02-26-2023 upon evaluation today patient presents for initial inspection here in our clinic concerning issues that he has been having with a wound on his left anterior lower leg. This actually began on 01-14-2023 when the patient was getting out of the car and subsequently they were turning the wheelchair around to move around the door and got too close to the edge of the door and the corner at the bottom actually cut his leg. This because of his lymphedema has been a very hard wound to heal following. Fortunately there does not appear to be any signs of infection systemically which is good news. With that being said even locally I do not see any signs of infection at this point. I do believe that he is making fairly good progress all things considered although I think that we definitely need to clean some this out and get it moving in the right direction. JAYDRIAN, CORPENING ([id number]) [id number] Page 6 of 8 Patient has been using Medihoney or rather his daughter has at home she is a Engineer, civil (consulting) she works in  the ICU office seen her previously as well as a patient. Subsequently after it was apparent that this was not getting better they did go on 7 June to [place] [facility] and at that point they actually put the patient on Augmentin which she started on 02-19-2023 and is still continuing that through current. His most recent hemoglobin A1c was 6.9 on 01-16-2023. He did have an x-ray done of the leg but this has not been released yet it still pending a reading. Currently could not tolerate the ABI screening but he seems to have good perfusion in regard to his leg I do not know that we go to gotten a lot of a good reading anyway when it comes to the ABIs even if we were able to do the screening due to the amount of lymphedema that he has with associated swelling. Patient does have a history of lymphedema, diabetes mellitus type 2, he is on oral hypoglycemic agents. He also has a history of hypertension and peripheral vascular disease although his last ABI check in 2018 was normal according to his daughter and he has seen vascular again at that time but has not had any recent ABIs performed. 03-09-2023 upon evaluation today patient appears to be doing well currently in regard to his wounds. He has been tolerating the dressing changes without complication. Fortunately I see signs of definite improvement and very pleased in this regard. 03-16-2023 upon evaluation today patient appears to be doing well currently in regard to his wound which is actually showing signs of improvement. Fortunately I do not see any evidence of active infection locally nor systemically at this time. 03-23-2023 upon evaluation today patient appears to be doing well currently in regard to his wound. He has been tolerating the dressing changes without complication. The wound actually showed signs of excellent improvement which is great news and very pleased in that regard. 03-30-2023 upon evaluation today patient appears to be doing well  currently in regard to his wound which is actually showing signs of significant improvement. Fortunately there does not appear to be any signs of active infection locally or systemically which is great news. No fevers, chills, nausea, vomiting, or diarrhea. Objective Constitutional Well-nourished and well-hydrated in no acute distress. Vitals Time  Taken: 10:31 AM, Height: 65 in, Weight: 180 lbs, BMI: 30, Temperature: 97.9 F, Pulse: 72 bpm, Respiratory Rate: 18 breaths/min, Blood Pressure: 177/85 mmHg. Respiratory normal breathing without difficulty. Psychiatric this patient is able to make decisions and demonstrates good insight into disease process. Alert and Oriented x 3. pleasant and cooperative. General Notes: Upon inspection patient's wound bed actually showed signs of good granulation and epithelization at this point. Fortunately I do not see any signs of worsening in general I do believe that the patient is making good headway towards complete closure which is excellent news as well. Integumentary (Hair, Skin) Wound #1 status is Open. Original cause of wound was Trauma. The date acquired was: 01/14/2023. The wound has been in treatment 4 weeks. The wound is located on the Left,Proximal,Midline Lower Leg. The wound measures 4cm length x 0.5cm width x 0.2cm depth; 1.571cm^2 area and 0.314cm^3 volume. There is Fat Layer (Subcutaneous Tissue) exposed. There is a medium amount of serosanguineous drainage noted. There is medium (34-66%) red granulation within the wound bed. There is a medium (34-66%) amount of necrotic tissue within the wound bed including Adherent Slough. Assessment Active Problems ICD-10 Laceration without foreign body, left lower leg, initial encounter Non-pressure chronic ulcer of other part of left lower leg with fat layer exposed Lymphedema, not elsewhere classified Type 2 diabetes mellitus with other skin ulcer Long term (current) use of oral hypoglycemic  drugs Essential (primary) hypertension Other specified peripheral vascular diseases Procedures Wound #1 Pre-procedure diagnosis of Wound #1 is a Trauma, Other located on the Left,Proximal,Midline Lower Leg . There was a Excisional Skin/Subcutaneous Tissue Debridement with a total area of 1.57 sq cm performed by [name], PA-C. With the following instrument(s): Curette to remove Viable and Non-Viable tissue/material. Material removed includes Subcutaneous Tissue and Slough and after achieving pain control using Lidocaine 4% T opical Solution. No specimens were taken. A time out was conducted at 10:51, prior to the start of the procedure. A Minimum amount of bleeding was controlled with Pressure. The procedure was tolerated well with a pain level of 0 throughout and a pain level of 0 following the procedure. Post Debridement Measurements: 4cm length x 0.5cm width x 0.2cm depth; 0.314cm^3 volume. LAVONTAE, CORNIA ([id number]) [id number] Page 7 of 8 Character of Wound/Ulcer Post Debridement is stable. Post procedure Diagnosis Wound #1: Same as Pre-Procedure Pre-procedure diagnosis of Wound #1 is a Trauma, Other located on the Left,Proximal,Midline Lower Leg . There was a Three Layer Compression Therapy Procedure by Midge Aver, RN. Post procedure Diagnosis Wound #1: Same as Pre-Procedure Plan Follow-up Appointments: Return Appointment in 1 week. Nurse Visit as needed Bathing/ Shower/ Hygiene: May shower with wound dressing protected with water repellent cover or cast protector. - pt advised to not get wrap wet No tub bath. Anesthetic (Use 'Patient Medications' Section for Anesthetic Order Entry): Lidocaine applied to wound bed Edema Control - Lymphedema / Segmental Compressive Device / Other: UrgoK2 LITE - size large used left leg Juxta Fit right leg Elevate, Exercise Daily and Avoid Standing for Long Periods of Time. Elevate leg(s) parallel to the floor  when sitting. DO YOUR BEST to sleep in the bed at night. DO NOT sleep in your recliner. Long hours of sitting in a recliner leads to swelling of the legs and/or potential wounds on your backside. Other: - discussed juxtalite for legs in future once swelling is down Non-Wound Condition: Apply appropriate compression. - urgo lite large used Additional non-wound orders/instructions: - AandD ointment to  bilat legs prior to wrapping Medications-Please add to medication list.: P.O. Antibiotics - continue taking previously prescribed oral antibiotics WOUND #1: - Lower Leg Wound Laterality: Left, Midline, Proximal Cleanser: Soap and Water 2 x Per Week/30 Days Discharge Instructions: Gently cleanse wound with antibacterial soap, rinse and pat dry prior to dressing wounds Prim Dressing: Promogran Matrix 4.34 (in) 2 x Per Week/30 Days ary Discharge Instructions: Moisten w/normal saline or sterile water; Cover wound as directed. Do not remove from wound bed. Secondary Dressing: Zetuvit Plus 4x8 (in/in) 2 x Per Week/30 Days Com pression Wrap: Urgo K2 Lite, two layer compression system, regular 2 x Per Week/30 Days 1. We do have the juxta fit compression wraps in for him at this point and I do think we will be going get that started currently. He is in agreement with the plan. Will see how things do on the right leg for the left leg when he continue to have compression wrapping as I feel like that is sufficient and much better. 2. Regarding continue with the Promogran and I hope that this will continue to allow this to feeling it seems to be doing a great job. 3. Will use the Urgo K2 lite on the left lower extremity which I think is doing really well. We will see patient back for reevaluation in 1 week here in the clinic. If anything worsens or changes patient will contact our office for additional recommendations. Electronic Signature(s) Signed: 03/31/2023 4:31:14 PM By: [name] PA-C Entered By:  [name] on 03/31/2023 16:31:14 -------------------------------------------------------------------------------- SuperBill Details Patient Name: Date of Service: Joe Barry [name] 03/30/2023 Medical Record Number: [id number] Patient Account Number: [id number] Date of Birth/Sex: Treating RN: 1933/10/04 (89 y.o. Joe Barry Primary Care Provider: [name] Other Clinician: Referring Provider: Treating Provider/Extender: Kallie Edward in Treatment: 4 Diagnosis [address] AJAMU, MAXON ([id number]) [id number] Page 8 of 8 ICD-10 Codes Code Description [phone] Laceration without foreign body, left lower leg, initial encounter [phone] Non-pressure chronic ulcer of other part of left lower leg with fat layer exposed I89.0 Lymphedema, not elsewhere classified E11.622 Type 2 diabetes mellitus with other skin ulcer Z79.84 Long term (current) use of oral hypoglycemic drugs I10 Essential (primary) hypertension I73.89 Other specified peripheral vascular diseases Facility Procedures : CPT4 Code: [id number] Description: 11042 - DEB SUBQ TISSUE 20 SQ CM/< ICD-10 Diagnosis Description L97.822 Non-pressure chronic ulcer of other part of left lower leg with fat layer expo Modifier: sed Quantity: 1 Physician Procedures : CPT4 Code Description Modifier [id number] 11042 - WC PHYS SUBQ TISS 20 SQ CM ICD-10 Diagnosis Description L97.822 Non-pressure chronic ulcer of other part of left lower leg with fat layer exposed Quantity: 1 Electronic Signature(s) Signed: 03/30/2023 5:52:03 PM By: [name] PA-C Entered By: [name] on 03/30/2023 17:52:03

## 2023-03-30 NOTE — Progress Notes (Addendum)
[name], [name] ([id number]) [id number] Page 1 of 9 Visit Report for 03/30/2023 Arrival Information Details Patient Name: Date of Service: Joe Barry Joe Barry [name] 03/30/2023 10:15 A M Medical Record Number: [id number] Patient Account Number: [id number] Date of Birth/Sex: Treating RN: Nov 22, 1933 (89 y.o. Joe Barry Primary Care Moria Brophy: [name] Other Clinician: Referring Lendy Dittrich: Treating Keileigh Vahey/Extender: Kallie Edward in Treatment: 4 Visit Information History Since Last Visit Added or deleted any medications: No Patient Arrived: Wheel Chair Any new allergies or adverse reactions: No Arrival Time: 10:25 Has Dressing in Place as Prescribed: Yes Accompanied By: daughter Pain Present Now: No Transfer Assistance: EasyPivot Patient Lift Patient Identification Verified: Yes Secondary Verification Process Completed: Yes Patient Requires Transmission-Based Precautions: No Patient Has Alerts: No Electronic Signature(s) Signed: 03/30/2023 2:53:57 PM By: Midge Aver MSN RN CNS WTA Entered By: Midge Aver on 03/30/2023 10:25:46 -------------------------------------------------------------------------------- Clinic Level of Care Assessment Details Patient Name: Date of Service: Joe Barry Joe Barry [name] 03/30/2023 10:15 A M Medical Record Number: [id number] Patient Account Number: [id number] Date of Birth/Sex: Treating RN: [birth year] (89 y.o. Joe Barry Primary Care Khamil Lamica: [name] Other Clinician: Referring Nabeel Gladson: Treating Tanishia Lemaster/Extender: Kallie Edward in Treatment: 4 Clinic Level of Care Assessment Items TOOL 1 Quantity Score []  - 0 Use when EandM and Procedure is performed on INITIAL visit ASSESSMENTS - Nursing Assessment / Reassessment []  - 0 General Physical Exam (combine w/ comprehensive assessment (listed just below) when performed on new pt. evals) []  - 0 Comprehensive Assessment (HX, ROS, Risk  Assessments, Wounds Hx, etc.) ASSESSMENTS - Wound and Skin Assessment / Reassessment []  - 0 Dermatologic / Skin Assessment (not related to wound area) ASSESSMENTS - Ostomy and/or Continence Assessment and Care ELZIE, KNISLEY ([id number]) [phone] Page 2 of 9 []  - 0 Incontinence Assessment and Management []  - 0 Ostomy Care Assessment and Management (repouching, etc.) PROCESS - Coordination of Care []  - 0 Simple Patient / Family Education for ongoing care []  - 0 Complex (extensive) Patient / Family Education for ongoing care []  - 0 Staff obtains Chiropractor, Records, T Results / Process Orders est []  - 0 Staff telephones HHA, Nursing Homes / Clarify orders / etc []  - 0 Routine Transfer to another [facility] (non-emergent condition) []  - 0 Routine [facility] Admission (non-emergent condition) []  - 0 New Admissions / Manufacturing engineer / Ordering NPWT Apligraf, etc. , []  - 0 Emergency [facility] Admission (emergent condition) PROCESS - Special Needs []  - 0 Pediatric / Minor Patient Management []  - 0 Isolation Patient Management []  - 0 Hearing / Language / Visual special needs []  - 0 Assessment of Community assistance (transportation, D/C planning, etc.) []  - 0 Additional assistance / Altered mentation []  - 0 Support Surface(s) Assessment (bed, cushion, seat, etc.) INTERVENTIONS - Miscellaneous []  - 0 External ear exam []  - 0 Patient Transfer (multiple staff / Nurse, adult / Similar devices) []  - 0 Simple Staple / Suture removal (25 or less) []  - 0 Complex Staple / Suture removal (26 or more) []  - 0 Hypo/Hyperglycemic Management (do not check if billed separately) []  - 0 Ankle / Brachial Index (ABI) - do not check if billed separately Has the patient been seen at the [facility] within the last three years: Yes Total Score: 0 Level Of Care: ____ Electronic Signature(s) Signed: 03/30/2023 2:53:57 PM By: Midge Aver MSN RN CNS WTA Entered By:  Midge Aver on 03/30/2023 10:53:13 -------------------------------------------------------------------------------- Compression Therapy Details Patient Name: Date of Service: TA  Joe Barry HN [name] 03/30/2023 10:15 A M Medical Record Number: [id number] Patient Account Number: [id number] Date of Birth/Sex: Treating RN: 1934/08/24 (89 y.o. Joe Barry Primary Care Augusten Lipkin: [name] Other Clinician: Referring Angelik Walls: Treating Joe Barry/Extender: Kallie Edward in Treatment: 4 Compression Therapy Performed for Wound Assessment: Wound #1 Left,Proximal,Midline Lower Leg Performed By: Clinician Midge Aver, RN Compression Type: [address] RAVIS, Joe ([id number]) [id number] Page 3 of 9 Post Procedure Diagnosis Same as Pre-procedure Electronic Signature(s) Signed: 03/30/2023 2:53:57 PM By: Midge Aver MSN RN CNS WTA Entered By: Midge Aver on 03/30/2023 10:50:14 -------------------------------------------------------------------------------- Encounter Discharge Information Details Patient Name: Date of Service: Joe Barry HN [name] 03/30/2023 10:15 A M Medical Record Number: [id number] Patient Account Number: [id number] Date of Birth/Sex: Treating RN: 1934/03/02 (89 y.o. Joe Barry Primary Care Alijah Akram: [name] Other Clinician: Referring Sedonia Kitner: Treating Joe Barry/Extender: Kallie Edward in Treatment: 4 Encounter Discharge Information Items Post Procedure Vitals Discharge Condition: Stable Temperature (F): 97.9 Ambulatory Status: Wheelchair Pulse (bpm): 72 Discharge Destination: Home Respiratory Rate (breaths/min): 18 Transportation: Private Auto Blood Pressure (mmHg): 177/85 Accompanied By: Daughter Schedule Follow-up Appointment: Yes Clinical Summary of Care: Electronic Signature(s) Signed: 03/30/2023 2:53:57 PM By: Midge Aver MSN RN CNS WTA Entered By: Midge Aver on 03/30/2023  10:55:00 -------------------------------------------------------------------------------- Lower Extremity Assessment Details Patient Name: Date of Service: Joe Barry HN [name] 03/30/2023 10:15 A M Medical Record Number: [id number] Patient Account Number: [id number] Date of Birth/Sex: Treating RN: [birth year] (89 y.o. Joe Barry Primary Care Quinterrius Errington: [name] Other Clinician: Referring Tenleigh Byer: Treating Oliviah Agostini/Extender: Kallie Edward in Treatment: 4 Edema Assessment Assessed: [name]: Yes] Franne Forts: Yes] [Left: Edema] Franne Forts: :] Calf Left: Right: MAVERICK, DIEUDONNE ([id number]) [id number] Page 4 of 9 Point of Measurement: 30 cm From Medial Instep 40.3 cm 39 cm Ankle Left: Right: Point of Measurement: 11 cm From Medial Instep 28.2 cm 27.7 cm Vascular Assessment Pulses: Dorsalis Pedis Palpable: [Left:Yes] [Right:Yes] Electronic Signature(s) Signed: 03/30/2023 2:53:57 PM By: Midge Aver MSN RN CNS WTA Entered By: Midge Aver on 03/30/2023 10:45:17 -------------------------------------------------------------------------------- Multi Wound Chart Details Patient Name: Date of Service: Joe Barry HN [name] 03/30/2023 10:15 A M Medical Record Number: [id number] Patient Account Number: [id number] Date of Birth/Sex: Treating RN: [birth year] (89 y.o. Joe Barry Primary Care Cilicia Borden: [name] Other Clinician: Referring Lashe Oliveira: Treating Horton Ellithorpe/Extender: Kallie Edward in Treatment: 4 Vital Signs Height(in): 65 Pulse(bpm): 72 Weight(lbs): 180 Blood Pressure(mmHg): 177/85 Body Mass Index(BMI): 30 Temperature(F): 97.9 Respiratory Rate(breaths/min): 18 [1:Photos:] [N/A:N/A] Left, Proximal, Midline Lower Leg N/A N/A Wound Location: Trauma N/A N/A Wounding Event: Trauma, Other N/A N/A Primary Etiology: Cataracts, Lymphedema, N/A N/A Comorbid History: Hypertension, Peripheral Venous Disease, Type II  Diabetes 01/14/2023 N/A N/A Date Acquired: 4 N/A N/A Weeks of Treatment: Open N/A N/A Wound Status: No N/A N/A Wound Recurrence: 4x0.5x0.2 N/A N/A Measurements L x W x D (cm) 1.571 N/A N/A A (cm) : rea 0.314 N/A N/A Volume (cm) : 85.50% N/A N/A % Reduction in Area: 90.30% N/A N/A % Reduction in Volume: Full Thickness Without Exposed N/A N/A Classification: Support Structures Medium N/A N/A Exudate Amount: JONAH, GINGRAS ([id number]) [id number] Page 5 of 9 Serosanguineous N/A N/A Exudate Type: red, brown N/A N/A Exudate Color: Medium (34-66%) N/A N/A Granulation A mount: Red N/A N/A Granulation Quality: Medium (34-66%) N/A N/A Necrotic A mount: Fat Layer (Subcutaneous Tissue): Yes N/A N/A Exposed Structures: Small (1-33%) N/A N/A  Epithelialization: Treatment Notes Electronic Signature(s) Signed: 03/30/2023 2:53:57 PM By: Midge Aver MSN RN CNS WTA Entered By: Midge Aver on 03/30/2023 10:46:19 -------------------------------------------------------------------------------- Multi-Disciplinary Care Plan Details Patient Name: Date of Service: Joe Barry HN [name] 03/30/2023 10:15 A M Medical Record Number: [id number] Patient Account Number: [id number] Date of Birth/Sex: Treating RN: [birth year] (89 y.o. Joe Barry Primary Care Mackinzee Roszak: [name] Other Clinician: Referring Amoreena Neubert: Treating Mattheus Rauls/Extender: Kallie Edward in Treatment: 4 Active Inactive Venous Leg Ulcer Nursing Diagnoses: Knowledge deficit related to disease process and management Potential for venous Insuffiency (use before diagnosis confirmed) Goals: Patient will maintain optimal edema control Date Initiated: 02/26/2023 Target Resolution Date: 04/23/2023 Goal Status: Active Patient/caregiver will verbalize understanding of disease process and disease management Date Initiated: 02/26/2023 Date Inactivated: 02/26/2023 Target Resolution Date:  02/26/2023 Goal Status: Met Interventions: Assess peripheral edema status every visit. Compression as ordered Treatment Activities: Therapeutic compression applied : 02/26/2023 Notes: Wound/Skin Impairment Nursing Diagnoses: Impaired tissue integrity Knowledge deficit related to ulceration/compromised skin integrity Goals: Ulcer/skin breakdown will have a volume reduction of 30% by week 4 Date Initiated: 02/26/2023 Date Inactivated: 03/23/2023 Target Resolution Date: 03/26/2023 Goal Status: Met Ulcer/skin breakdown will have a volume reduction of 50% by week 8 Date Initiated: 02/26/2023 Target Resolution Date: 04/23/2023 TISHAWN, FRIEDHOFF ([id number]) [id number].pdf Page 6 of 9 Goal Status: Active Ulcer/skin breakdown will have a volume reduction of 80% by week 12 Date Initiated: 02/26/2023 Target Resolution Date: 05/21/2023 Goal Status: Active Ulcer/skin breakdown will heal within 14 weeks Date Initiated: 02/26/2023 Target Resolution Date: 06/04/2023 Goal Status: Active Interventions: Assess patient/caregiver ability to obtain necessary supplies Assess patient/caregiver ability to perform ulcer/skin care regimen upon admission and as needed Assess ulceration(s) every visit Provide education on ulcer and skin care Treatment Activities: Skin care regimen initiated : 02/26/2023 Notes: Electronic Signature(s) Signed: 03/30/2023 2:53:57 PM By: Midge Aver MSN RN CNS WTA Entered By: Midge Aver on 03/30/2023 10:53:38 -------------------------------------------------------------------------------- Pain Assessment Details Patient Name: Date of Service: Joe Barry HN [name] 03/30/2023 10:15 A M Medical Record Number: [id number] Patient Account Number: [id number] Date of Birth/Sex: Treating RN: 1933-12-18 (89 y.o. Joe Barry Primary Care Evan Osburn: [name] Other Clinician: Referring Megham Dwyer: Treating Ameah Chanda/Extender: Kallie Edward in  Treatment: 4 Active Problems Location of Pain Severity and Description of Pain Patient Has Paino No Site Locations Pain Management and Medication Current Pain Management: Electronic Signature(s) Signed: 03/30/2023 2:53:57 PM By: Midge Aver MSN RN CNS Margarita Rana JACIER, GLADU R7/16/2024 2:53:57 PM By: Midge Aver MSN RN CNS WTA Signed: ([id number]) [id number] Page 7 of 9 Entered By: Midge Aver on 03/30/2023 10:33:57 -------------------------------------------------------------------------------- Patient/Caregiver Education Details Patient Name: Date of Service: TA Joe Barry [place] [name] 7/16/2024andnbsp10:15 A M Medical Record Number: [id number] Patient Account Number: [id number] Date of Birth/Gender: Treating RN: 1934-08-30 (89 y.o. Joe Barry Primary Care Physician: [name] Other Clinician: Referring Physician: Treating Physician/Extender: Kallie Edward in Treatment: 4 Education Assessment Education Provided To: Patient Education Topics Provided Wound Debridement: Handouts: Wound Debridement Methods: Explain/Verbal Responses: State content correctly Electronic Signature(s) Signed: 03/30/2023 2:53:57 PM By: Midge Aver MSN RN CNS WTA Entered By: Midge Aver on 03/30/2023 10:53:50 -------------------------------------------------------------------------------- Wound Assessment Details Patient Name: Date of Service: Joe Barry HN [name] 03/30/2023 10:15 A M Medical Record Number: [id number] Patient Account Number: [id number] Date of Birth/Sex: Treating RN: November 07, 1933 (89 y.o. Joe Barry Primary Care Clay Menser: [name] Other Clinician: Referring Charne Mcbrien: Treating Isidoro Santillana/Extender: Larina Bras,  Honor Junes, Isaic Weeks in Treatment: 4 Wound Status Wound Number: 1 Primary Trauma, Other Etiology: Wound Location: Left, Proximal, Midline Lower Leg Wound Open Wounding Event: Trauma Status: Date Acquired: 01/14/2023 Comorbid  Cataracts, Lymphedema, Hypertension, Peripheral Venous Weeks Of Treatment: 4 History: Disease, Type II Diabetes Clustered Wound: No Photos DUGAN, VANHOESEN ([id number]) [id number] Page 8 of 9 Wound Measurements Length: (cm) 4 Width: (cm) 0.5 Depth: (cm) 0.2 Area: (cm) 1.571 Volume: (cm) 0.314 % Reduction in Area: 85.5% % Reduction in Volume: 90.3% Epithelialization: Small (1-33%) Wound Description Classification: Full Thickness Without Exposed Support Structures Exudate Amount: Medium Exudate Type: Serosanguineous Exudate Color: red, brown Foul Odor After Cleansing: No Slough/Fibrino Yes Wound Bed Granulation Amount: Medium (34-66%) Exposed Structure Granulation Quality: Red Fat Layer (Subcutaneous Tissue) Exposed: Yes Necrotic Amount: Medium (34-66%) Necrotic Quality: Adherent Slough Treatment Notes Wound #1 (Lower Leg) Wound Laterality: Left, Midline, Proximal Cleanser Soap and Water Discharge Instruction: Gently cleanse wound with antibacterial soap, rinse and pat dry prior to dressing wounds Peri-Wound Care Topical Primary Dressing Promogran Matrix 4.34 (in) Discharge Instruction: Moisten w/normal saline or sterile water; Cover wound as directed. Do not remove from wound bed. Secondary Dressing Zetuvit Plus 4x8 (in/in) Secured With Compression Wrap Urgo K2 Lite, two layer compression system, regular Compression Stockings Add-Ons Electronic Signature(s) Signed: 03/30/2023 2:53:57 PM By: Midge Aver MSN RN CNS WTA Entered By: Midge Aver on 03/30/2023 10:43:32 ESTEVAN, KERSH ([id number]) [id number] Page 9 of 9 -------------------------------------------------------------------------------- Vitals Details Patient Name: Date of Service: Joe Barry [facility] [name] 03/30/2023 10:15 A M Medical Record Number: [id number] Patient Account Number: [id number] Date of Birth/Sex: Treating RN: 07-16-1934 (89 y.o. Joe Barry Primary Care Monaca Wadas: [name] Other Clinician: Referring Aleesa Sweigert: Treating Aracelly Tencza/Extender: Kallie Edward in Treatment: 4 Vital Signs Time Taken: 10:31 Temperature (F): 97.9 Height (in): 65 Pulse (bpm): 72 Weight (lbs): 180 Respiratory Rate (breaths/min): 18 Body Mass Index (BMI): 30 Blood Pressure (mmHg): 177/85 Reference Range: 80 - 120 mg / dl Electronic Signature(s) Signed: 03/30/2023 2:53:57 PM By: Midge Aver MSN RN CNS WTA Entered By: Midge Aver on 03/30/2023 10:33:48

## 2023-04-06 ENCOUNTER — Encounter: Payer: Medicare PPO | Admitting: Internal Medicine

## 2023-04-06 DIAGNOSIS — I890 Lymphedema, not elsewhere classified: Secondary | ICD-10-CM | POA: Diagnosis not present

## 2023-04-06 DIAGNOSIS — S81812A Laceration without foreign body, left lower leg, initial encounter: Secondary | ICD-10-CM | POA: Diagnosis not present

## 2023-04-06 DIAGNOSIS — L97822 Non-pressure chronic ulcer of other part of left lower leg with fat layer exposed: Secondary | ICD-10-CM | POA: Diagnosis not present

## 2023-04-06 DIAGNOSIS — I10 Essential (primary) hypertension: Secondary | ICD-10-CM | POA: Diagnosis not present

## 2023-04-06 DIAGNOSIS — Z7984 Long term (current) use of oral hypoglycemic drugs: Secondary | ICD-10-CM | POA: Diagnosis not present

## 2023-04-06 DIAGNOSIS — E11622 Type 2 diabetes mellitus with other skin ulcer: Secondary | ICD-10-CM | POA: Diagnosis not present

## 2023-04-06 DIAGNOSIS — S81802A Unspecified open wound, left lower leg, initial encounter: Secondary | ICD-10-CM | POA: Diagnosis not present

## 2023-04-06 NOTE — Progress Notes (Signed)
SUNNY, GAINS ([id number]) [id number] Page 1 of 9 Visit Report for 04/06/2023 Arrival Information Details Patient Name: Date of Service: Joe Barry [facility] R. 04/06/2023 10:30 A M Medical Record Number: [id number] Patient Account Number: [id number] Date of Birth/Sex: Treating RN: 06-Feb-1934 (89 y.o. [name], [name] Primary Care Coda Mathey: [name] Other Clinician: Referring Loise Esguerra: Treating Anaalicia Reimann/Extender: RO BSO Dorris Carnes, MICHA EL [name] in Treatment: 5 Visit Information History Since Last Visit All ordered tests and consults were completed: No Patient Arrived: Wheel Chair Added or deleted any medications: No Arrival Time: 10:43 Any new allergies or adverse reactions: No Accompanied By: daughter Hospitalized since last visit: No Transfer Assistance: None Pain Present Now: No Patient Identification Verified: Yes Secondary Verification Process Completed: Yes Patient Requires Transmission-Based Precautions: No Patient Has Alerts: No Electronic Signature(s) Signed: 04/06/2023 3:20:29 PM By: Bonnell Public Entered By: Bonnell Public on 04/06/2023 10:44:09 -------------------------------------------------------------------------------- Clinic Level of Care Assessment Details Patient Name: Date of Service: Joe Barry [facility] R. 04/06/2023 10:30 A M Medical Record Number: [id number] Patient Account Number: [id number] Date of Birth/Sex: Treating RN: 08/30/1934 (89 y.o. [name], [name] Primary Care Jeromey Kruer: [name] Other Clinician: Referring Kynadee Dam: Treating Eric Nees/Extender: RO BSO N, MICHA EL [name] in Treatment: 5 Clinic Level of Care Assessment Items TOOL 4 Quantity Score []  - 0 Use when only an EandM is performed on FOLLOW-UP visit ASSESSMENTS - Nursing Assessment / Reassessment X- 1 10 Reassessment of Co-morbidities (includes updates in patient status) X- 1 5 Reassessment of Adherence to Treatment Plan ASSESSMENTS  - Wound and Skin A ssessment / Reassessment X - Simple Wound Assessment / Reassessment - one wound 1 5 []  - 0 Complex Wound Assessment / Reassessment - multiple wounds Joe Barry, Joe Barry ([id number]) [id number] Page 2 of 9 []  - 0 Dermatologic / Skin Assessment (not related to wound area) ASSESSMENTS - Focused Assessment []  - 0 Circumferential Edema Measurements - multi extremities []  - 0 Nutritional Assessment / Counseling / Intervention []  - 0 Lower Extremity Assessment (monofilament, tuning fork, pulses) []  - 0 Peripheral Arterial Disease Assessment (using hand held doppler) ASSESSMENTS - Ostomy and/or Continence Assessment and Care []  - 0 Incontinence Assessment and Management []  - 0 Ostomy Care Assessment and Management (repouching, etc.) PROCESS - Coordination of Care []  - 0 Simple Patient / Family Education for ongoing care []  - 0 Complex (extensive) Patient / Family Education for ongoing care X- 1 10 Staff obtains Chiropractor, Records, T Results / Process Orders est []  - 0 Staff telephones HHA, Nursing Homes / Clarify orders / etc []  - 0 Routine Transfer to another Facility (non-emergent condition) []  - 0 Routine Hospital Admission (non-emergent condition) []  - 0 New Admissions / Manufacturing engineer / Ordering NPWT Apligraf, etc. , []  - 0 Emergency Hospital Admission (emergent condition) X- 1 10 Simple Discharge Coordination []  - 0 Complex (extensive) Discharge Coordination PROCESS - Special Needs []  - 0 Pediatric / Minor Patient Management []  - 0 Isolation Patient Management []  - 0 Hearing / Language / Visual special needs []  - 0 Assessment of Community assistance (transportation, D/C planning, etc.) []  - 0 Additional assistance / Altered mentation []  - 0 Support Surface(s) Assessment (bed, cushion, seat, etc.) INTERVENTIONS - Wound Cleansing / Measurement X - Simple Wound Cleansing - one wound 1 5 []  - 0 Complex Wound  Cleansing - multiple wounds X- 1 5 Wound Imaging (photographs - any number of wounds) []  - 0 Wound Tracing (instead of photographs)  X- 1 5 Simple Wound Measurement - one wound []  - 0 Complex Wound Measurement - multiple wounds INTERVENTIONS - Wound Dressings []  - 0 Small Wound Dressing one or multiple wounds X- 1 15 Medium Wound Dressing one or multiple wounds []  - 0 Large Wound Dressing one or multiple wounds []  - 0 Application of Medications - topical []  - 0 Application of Medications - injection INTERVENTIONS - Miscellaneous []  - 0 External ear exam []  - 0 Specimen Collection (cultures, biopsies, blood, body fluids, etc.) []  - 0 Specimen(s) / Culture(s) sent or taken to Lab for analysis Joe Barry, Joe Barry ([id number]) [phone].pdf Page 3 of 9 []  - 0 Patient Transfer (multiple staff / Nurse, adult / Similar devices) []  - 0 Simple Staple / Suture removal (25 or less) []  - 0 Complex Staple / Suture removal (26 or more) []  - 0 Hypo / Hyperglycemic Management (close monitor of Blood Glucose) []  - 0 Ankle / Brachial Index (ABI) - do not check if billed separately X- 1 5 Vital Signs Has the patient been seen at the hospital within the last three years: Yes Total Score: 75 Level Of Care: New/Established - Level 2 Electronic Signature(s) Signed: 04/06/2023 3:20:29 PM By: Bonnell Public Entered By: Bonnell Public on 04/06/2023 11:15:40 -------------------------------------------------------------------------------- Encounter Discharge Information Details Patient Name: Date of Service: Joe Barry [name] 04/06/2023 10:30 A M Medical Record Number: [id number] Patient Account Number: [id number] Date of Birth/Sex: Treating RN: Dec 06, 1933 (89 y.o. [name], [name] Primary Care Taiana Temkin: [name] Other Clinician: Referring Lee Kalt: Treating Shem Plemmons/Extender: RO BSO Dorris Carnes, MICHA EL [name] in Treatment: 5 Encounter Discharge Information  Items Discharge Condition: Stable Ambulatory Status: Wheelchair Discharge Destination: Home Transportation: Private Auto Accompanied By: daughter Schedule Follow-up Appointment: Yes Clinical Summary of Care: Electronic Signature(s) Signed: 04/06/2023 3:20:29 PM By: Bonnell Public Entered By: Bonnell Public on 04/06/2023 11:17:13 -------------------------------------------------------------------------------- Lower Extremity Assessment Details Patient Name: Date of Service: Joe Barry [facility] R. 04/06/2023 10:30 A M Medical Record Number: [id number] Patient Account Number: [id number] Date of Birth/Sex: Treating RN: [birth year] (88 y.o. [name], [name] Primary Care Tayden Duran: [name] Other Clinician: Referring Diavion Labrador: Treating Elzada Pytel/Extender: [address], MICHA EL Callin, [place], [place] ([id number]) [id number] Page 4 of 9 Weeks in Treatment: 5 Edema Assessment Assessed: [Left: No] [Right: No] Edema: [Left: N] [Right: o] Vascular Assessment Pulses: Dorsalis Pedis Palpable: [Left:Yes] Electronic Signature(s) Signed: 04/06/2023 3:20:29 PM By: Bonnell Public Entered By: Bonnell Public on 04/06/2023 10:54:12 -------------------------------------------------------------------------------- Multi Wound Chart Details Patient Name: Date of Service: Joe Barry [name] 04/06/2023 10:30 A M Medical Record Number: [id number] Patient Account Number: [id number] Date of Birth/Sex: Treating RN: 11/29/1933 (89 y.o. [name], [name] Primary Care Aanchal Cope: [name] Other Clinician: Referring Cleaster Shiffer: Treating Alannah Averhart/Extender: RO BSO N, MICHA EL Hulan Fray, Yvan Weeks in Treatment: 5 Vital Signs Height(in): 65 Pulse(bpm): 91 Weight(lbs): 180 Blood Pressure(mmHg): 157/68 Body Mass Index(BMI): 30 Temperature(F): 98.6 Respiratory Rate(breaths/min): 18 [1:Photos:] [N/A:N/A] Left, Proximal, Midline Lower Leg N/A N/A Wound Location: Trauma N/A N/A Wounding  Event: Trauma, Other N/A N/A Primary Etiology: Cataracts, Lymphedema, N/A N/A Comorbid History: Hypertension, Peripheral Venous Disease, Type II Diabetes 01/14/2023 N/A N/A Date Acquired: 5 N/A N/A Weeks of Treatment: Open N/A N/A Wound Status: No N/A N/A Wound Recurrence: 4x0.5x0.2 N/A N/A Measurements L x W x D (cm) 1.571 N/A N/A A (cm) : rea 0.314 N/A N/A Volume (cm) : 85.50% N/A N/A % Reduction in Area: 90.30% N/A  N/A % Reduction in Volume: Full Thickness Without Exposed N/A N/A Classification: [address] Joe Barry, Joe Barry ([id number]) [id number] Page 5 of 9 Small N/A N/A Exudate Amount: Serosanguineous N/A N/A Exudate Type: red, brown N/A N/A Exudate Color: Medium (34-66%) N/A N/A Granulation Amount: Red N/A N/A Granulation Quality: Medium (34-66%) N/A N/A Necrotic Amount: Fat Layer (Subcutaneous Tissue): Yes N/A N/A Exposed Structures: Fascia: No Tendon: No Muscle: No Joint: No Bone: No Small (1-33%) N/A N/A Epithelialization: Treatment Notes Electronic Signature(s) Signed: 04/06/2023 3:20:29 PM By: Bonnell Public Entered By: Bonnell Public on 04/06/2023 10:54:17 -------------------------------------------------------------------------------- Multi-Disciplinary Care Plan Details Patient Name: Date of Service: Joe Barry [name] 04/06/2023 10:30 A M Medical Record Number: [id number] Patient Account Number: [id number] Date of Birth/Sex: Treating RN: [birth year] (89 y.o. [name], [name] Primary Care Dianne Bady: [name] Other Clinician: Referring Fredy Gladu: Treating Marasia Newhall/Extender: RO BSO N, MICHA EL [name] in Treatment: 5 Active Inactive Venous Leg Ulcer Nursing Diagnoses: Knowledge deficit related to disease process and management Potential for venous Insuffiency (use before diagnosis confirmed) Goals: Patient will maintain optimal edema control Date Initiated: 02/26/2023 Target Resolution Date:  04/23/2023 Goal Status: Active Patient/caregiver will verbalize understanding of disease process and disease management Date Initiated: 02/26/2023 Date Inactivated: 02/26/2023 Target Resolution Date: 02/26/2023 Goal Status: Unmet Unmet Reason: comorbidities Interventions: Assess peripheral edema status every visit. Compression as ordered Treatment Activities: Therapeutic compression applied : 02/26/2023 Notes: Wound/Skin Impairment Nursing Diagnoses: Impaired tissue integrity Knowledge deficit related to ulceration/compromised skin integrity Goals: Joe Barry, Joe Barry ([id number]) [phone].pdf Page 6 of 9 Ulcer/skin breakdown will have a volume reduction of 30% by week 4 Date Initiated: 02/26/2023 Date Inactivated: 03/23/2023 Target Resolution Date: 03/26/2023 Goal Status: Met Ulcer/skin breakdown will have a volume reduction of 50% by week 8 Date Initiated: 02/26/2023 Target Resolution Date: 04/23/2023 Goal Status: Active Ulcer/skin breakdown will have a volume reduction of 80% by week 12 Date Initiated: 02/26/2023 Target Resolution Date: 05/21/2023 Goal Status: Active Ulcer/skin breakdown will heal within 14 weeks Date Initiated: 02/26/2023 Target Resolution Date: 06/04/2023 Goal Status: Active Interventions: Assess patient/caregiver ability to obtain necessary supplies Assess patient/caregiver ability to perform ulcer/skin care regimen upon admission and as needed Assess ulceration(s) every visit Provide education on ulcer and skin care Treatment Activities: Skin care regimen initiated : 02/26/2023 Notes: Electronic Signature(s) Signed: 04/06/2023 3:20:29 PM By: Bonnell Public Entered By: Bonnell Public on 04/06/2023 11:16:17 -------------------------------------------------------------------------------- Pain Assessment Details Patient Name: Date of Service: Joe Barry [name] 04/06/2023 10:30 A M Medical Record Number: [id number] Patient Account Number:  [id number] Date of Birth/Sex: Treating RN: 1933-11-18 (89 y.o. [name], [name] Primary Care Maelle Sheaffer: [name] Other Clinician: Referring Kellen Dutch: Treating Jonita Hirota/Extender: RO BSO Dorris Carnes, MICHA EL [name] in Treatment: 5 Active Problems Location of Pain Severity and Description of Pain Patient Has Paino No Site Locations Pain Management and Medication Current Pain Management: Joe Barry, Joe Barry ([id number]) [phone].pdf Page 7 of 9 Electronic Signature(s) Signed: 04/06/2023 3:20:29 PM By: Bonnell Public Entered By: Bonnell Public on 04/06/2023 10:45:21 -------------------------------------------------------------------------------- Patient/Caregiver Education Details Patient Name: Date of Service: Joe Adrian [facility] R. 7/23/2024andnbsp10:30 A M Medical Record Number: [id number] Patient Account Number: [id number] Date of Birth/Gender: Treating RN: 1934-03-27 (89 y.o. [name], [name] Primary Care Physician: [name] Other Clinician: Referring Physician: Treating Physician/Extender: RO BSO Dorris Carnes, MICHA EL [name] in Treatment: 5 Education Assessment Education Provided To: Patient Education Topics Provided Wound/Skin Impairment: Handouts: Caring for Your Ulcer Methods: Explain/Verbal  Responses: State content correctly Electronic Signature(s) Signed: 04/06/2023 3:20:29 PM By: Bonnell Public Entered By: Bonnell Public on 04/06/2023 11:16:35 -------------------------------------------------------------------------------- Wound Assessment Details Patient Name: Date of Service: Joe Barry [facility] R. 04/06/2023 10:30 A M Medical Record Number: [id number] Patient Account Number: [id number] Date of Birth/Sex: Treating RN: 10/27/1933 (89 y.o. [name], [name] Primary Care Rise Traeger: [name] Other Clinician: Referring Quince Santana: Treating Irmgard Rampersaud/Extender: RO BSO N, MICHA EL Hulan Fray, Damek Weeks in Treatment: 5 Wound Status Wound Number:  1 Primary Trauma, Other Etiology: Wound Location: Left, Proximal, Midline Lower Leg Wound Open Wounding Event: Trauma Status: Date Acquired: 01/14/2023 Joe Barry, Joe Barry ([id number]) [id number].pdf Page 8 of 9 Date Acquired: 01/14/2023 Comorbid Cataracts, Lymphedema, Hypertension, Peripheral Venous Weeks Of Treatment: 5 History: Disease, Type II Diabetes Clustered Wound: No Photos Wound Measurements Length: (cm) 4 Width: (cm) 0.5 Depth: (cm) 0.2 Area: (cm) 1.571 Volume: (cm) 0.314 % Reduction in Area: 85.5% % Reduction in Volume: 90.3% Epithelialization: Small (1-33%) Wound Description Classification: Full Thickness Without Exposed Support Structures Exudate Amount: Small Exudate Type: Serosanguineous Exudate Color: red, brown Foul Odor After Cleansing: No Slough/Fibrino Yes Wound Bed Granulation Amount: Medium (34-66%) Exposed Structure Granulation Quality: Red Fascia Exposed: No Necrotic Amount: Medium (34-66%) Fat Layer (Subcutaneous Tissue) Exposed: Yes Necrotic Quality: Adherent Slough Tendon Exposed: No Muscle Exposed: No Joint Exposed: No Bone Exposed: No Treatment Notes Wound #1 (Lower Leg) Wound Laterality: Left, Midline, Proximal Cleanser Soap and Water Discharge Instruction: Gently cleanse wound with antibacterial soap, rinse and pat dry prior to dressing wounds Peri-Wound Care Topical Primary Dressing Promogran Matrix 4.34 (in) Discharge Instruction: Moisten w/normal saline or sterile water; Cover wound as directed. Do not remove from wound bed. Secondary Dressing Zetuvit Plus 4x8 (in/in) Secured With Compression Wrap Urgo K2 Lite, two layer compression system, regular Compression Stockings Add-Ons Electronic Signature(s) Signed: 04/06/2023 3:20:29 PM By: Bonnell Public Entered By: Bonnell Public on 04/06/2023 10:52:19 Joe Barry ([id number]) [id number] Page 9 of  9 -------------------------------------------------------------------------------- Vitals Details Patient Name: Date of Service: Joe Barry [facility] R. 04/06/2023 10:30 A M Medical Record Number: [id number] Patient Account Number: [id number] Date of Birth/Sex: Treating RN: 05-27-1934 (89 y.o. [name], [name] Primary Care Haasini Patnaude: [name] Other Clinician: Referring Lexx Monte: Treating Ondra Deboard/Extender: RO BSO N, MICHA EL Hulan Fray, Erick Weeks in Treatment: 5 Vital Signs Time Taken: 10:43 Temperature (F): 98.6 Height (in): 65 Pulse (bpm): 91 Weight (lbs): 180 Respiratory Rate (breaths/min): 18 Body Mass Index (BMI): 30 Blood Pressure (mmHg): 157/68 Reference Range: 80 - 120 mg / dl Electronic Signature(s) Signed: 04/06/2023 3:20:29 PM By: Bonnell Public Entered By: Bonnell Public on 04/06/2023 10:44:42

## 2023-04-06 NOTE — Progress Notes (Signed)
Joe Barry, Joe Barry ([id number]) [id number] Page 1 of 6 Visit Report for 04/06/2023 HPI Details Patient Name: Date of Service: Joe Barry [facility] R. 04/06/2023 10:30 A M Medical Record Number: [id number] Patient Account Number: [id number] Date of Birth/Sex: Treating RN: November 11, 1933 (89 y.o. Joe Barry, Joe Barry Primary Care Provider: Sharlot Barry Other Clinician: Referring Provider: Treating Provider/Extender: Joe Barry, Joe Barry Joe Barry in Treatment: 5 History of Present Illness HPI Description: 02-26-2023 upon evaluation today patient presents for initial inspection here in our clinic concerning issues that he has been having with a wound on his left anterior lower leg. This actually began on 01-14-2023 when the patient was getting out of the car and subsequently they were turning the wheelchair around to move around the door and got too close to the edge of the door and the corner at the bottom actually cut his leg. This because of his lymphedema has been a very hard wound to heal following. Fortunately there does not appear to be any signs of infection systemically which is good news. With that being said even locally I do not see any signs of infection at this point. I do believe that he is making fairly good progress all things considered although I think that we definitely need to clean some this out and get it moving in the right direction. Patient has been using Medihoney or rather his daughter has at home she is a Engineer, civil (consulting) she works in the ICU office seen her previously as well as a patient. Subsequently after it was apparent that this was not getting better they did go on 7 June to [place] [facility] and at that point they actually put the patient on Augmentin which she started on 02-19-2023 and is still continuing that through current. His Barry recent hemoglobin A1c was 6.9 on 01-16-2023. He did have an x-ray done of the leg but this has not been released yet  it still pending a reading. Currently could not tolerate the ABI screening but he seems to have good perfusion in regard to his leg I do not know that we go to gotten a lot of a good reading anyway when it comes to the ABIs even if we were able to do the screening due to the amount of lymphedema that he has with associated swelling. Patient does have a history of lymphedema, diabetes mellitus type 2, he is on oral hypoglycemic agents. He also has a history of hypertension and peripheral vascular disease although his last ABI check in 2018 was normal according to his daughter and he has seen vascular again at that time but has not had any recent ABIs performed. 03-09-2023 upon evaluation today patient appears to be doing well currently in regard to his wounds. He has been tolerating the dressing changes without complication. Fortunately I see signs of definite improvement and very pleased in this regard. 03-16-2023 upon evaluation today patient appears to be doing well currently in regard to his wound which is actually showing signs of improvement. Fortunately I do not see any evidence of active infection locally nor systemically at this time. 03-23-2023 upon evaluation today patient appears to be doing well currently in regard to his wound. He has been tolerating the dressing changes without complication. The wound actually showed signs of excellent improvement which is great news and very pleased in that regard. 03-30-2023 upon evaluation today patient appears to be doing well currently in regard to his wound which is actually showing signs  of significant improvement. Fortunately there does not appear to be any signs of active infection locally or systemically which is great news. No fevers, chills, nausea, vomiting, or diarrhea. 7/23; this was a car door injury in the anterior mid tibia. We have been making excellent progress using collagen under Urgo K2 light compression.He has chronic edema probably  lymphedema in the left leg Electronic Signature(s) Signed: 04/06/2023 4:43:46 PM By: Joe Najjar MD Entered By: Joe Barry on 04/06/2023 11:13:16 Joe Barry ([id number]) [id number] Page 2 of 6 -------------------------------------------------------------------------------- Physical Exam Details Patient Name: Date of Service: Joe Artis [place] R. 04/06/2023 10:30 A M Medical Record Number: [id number] Patient Account Number: [id number] Date of Birth/Sex: Treating RN: 1934-06-17 (89 y.o. Joe Barry, Joe Barry Primary Care Provider: Sharlot Barry Other Clinician: Referring Provider: Treating Provider/Extender: RO BSO N, Joe Barry Joe Barry in Treatment: 5 Constitutional Patient is hypertensive.. Pulse regular and within target range for patient.Marland Kitchen Respirations regular, non-labored and within target range.. Temperature is normal and within the target range for the patient.Marland Kitchen appears in no distress. Notes Wound exam; left anterior mid tibia. There is slight undermining laterally in the lower part of this wound however the granulation tissue looks very healthy no evidence of surrounding infection. Reviewing the measurements of the wound shows significant improvement and continued improvement Electronic Signature(s) Signed: 04/06/2023 4:43:46 PM By: Joe Najjar MD Entered By: Joe Barry on 04/06/2023 11:15:29 -------------------------------------------------------------------------------- Physician Orders Details Patient Name: Date of Service: Joe Artis [name] 04/06/2023 10:30 A M Medical Record Number: [id number] Patient Account Number: [id number] Date of Birth/Sex: Treating RN: [birth year] (89 y.o. Joe Barry, Joe Barry Primary Care Provider: Sharlot Barry Other Clinician: Referring Provider: Treating Provider/Extender: RO BSO N, Joe Barry Joe Barry in Treatment: 5 Verbal / Phone Orders: No Diagnosis Coding Follow-up  Appointments Return Appointment in 1 week. Nurse Visit as needed Bathing/ Shower/ Hygiene May shower with wound dressing protected with water repellent cover or cast protector. - pt advised to not get wrap wet No tub bath. Anesthetic (Use 'Patient Medications' Section for Anesthetic Order Entry) Lidocaine applied to wound bed Edema Control - Lymphedema / Segmental Compressive Device / Other UrgoK2 LITE - Juxta Fit right leg Elevate, Exercise Daily and A void Standing for Long Periods of Time. Elevate leg(s) parallel to the floor when sitting. DO YOUR BEST to sleep in the bed at night. DO NOT sleep in your recliner. Long hours of sitting in a recliner leads to swelling of the legs and/or potential wounds on your backside. Other: - discussed juxtalite for legs in future once swelling is down Non-Wound Condition Right Lower Extremity pply appropriate compression. - urgo lite large used A dditional non-wound orders/instructions: - AandD ointment to bilat legs prior to wrapping A Wound Treatment Wound #1 - Lower Leg Wound Laterality: Left, Midline, Proximal Cleanser: Soap and Water 1 x Per Week/30 Days Discharge Instructions: Gently cleanse wound with antibacterial soap, rinse and pat dry prior to dressing wounds Joe Barry, Joe Barry ([id number]) [phone].pdf Page 3 of 6 Prim Dressing: Promogran Matrix 4.34 (in) 1 x Per Week/30 Days ary Discharge Instructions: Moisten w/normal saline or sterile water; Cover wound as directed. Do not remove from wound bed. Secondary Dressing: Zetuvit Plus 4x8 (in/in) 1 x Per Week/30 Days Compression Wrap: Urgo K2 Lite, two layer compression system, regular 1 x Per Week/30 Days Electronic Signature(s) Signed: 04/06/2023 3:20:29 PM By: Bonnell Public Signed: 04/06/2023 4:43:46 PM By: Joe Najjar MD Entered By: Penne Lash,  Joe Barry on 04/06/2023  10:59:50 -------------------------------------------------------------------------------- Problem List Details Patient Name: Date of Service: Joe Artis [facility] R. 04/06/2023 10:30 A M Medical Record Number: [id number] Patient Account Number: [id number] Date of Birth/Sex: Treating RN: June 07, 1934 (89 y.o. Joe Barry, Joe Barry Primary Care Provider: Sharlot Barry Other Clinician: Referring Provider: Treating Provider/Extender: Joe Barry, Joe Barry Joe Barry in Treatment: 5 Active Problems ICD-10 Encounter Code Description Active Date MDM Diagnosis S81.812A Laceration without foreign body, left lower leg, initial encounter 02/26/2023 No Yes L97.822 Non-pressure chronic ulcer of other part of left lower leg with fat layer exposed6/14/2024 No Yes I89.0 Lymphedema, not elsewhere classified 02/26/2023 No Yes E11.622 Type 2 diabetes mellitus with other skin ulcer 02/26/2023 No Yes Z79.84 Long term (current) use of oral hypoglycemic drugs 02/26/2023 No Yes I10 Essential (primary) hypertension 02/26/2023 No Yes I73.89 Other specified peripheral vascular diseases 02/26/2023 No Yes Inactive Problems [address] Joe Barry, Joe Barry ([id number]) [phone].pdf Page 4 of 6 Electronic Signature(s) Signed: 04/06/2023 4:43:46 PM By: Joe Najjar MD Entered By: Joe Barry on 04/06/2023 11:12:11 -------------------------------------------------------------------------------- Progress Note Details Patient Name: Date of Service: Joe Artis [name] 04/06/2023 10:30 A M Medical Record Number: [id number] Patient Account Number: [id number] Date of Birth/Sex: Treating RN: 03-21-1934 (89 y.o. Joe Barry, Joe Barry Primary Care Provider: Sharlot Barry Other Clinician: Referring Provider: Treating Provider/Extender: Joe Barry, Joe Barry Joe Barry in Treatment: 5 Subjective History of Present Illness (HPI) 02-26-2023 upon evaluation today patient presents for initial  inspection here in our clinic concerning issues that he has been having with a wound on his left anterior lower leg. This actually began on 01-14-2023 when the patient was getting out of the car and subsequently they were turning the wheelchair around to move around the door and got too close to the edge of the door and the corner at the bottom actually cut his leg. This because of his lymphedema has been a very hard wound to heal following. Fortunately there does not appear to be any signs of infection systemically which is good news. With that being said even locally I do not see any signs of infection at this point. I do believe that he is making fairly good progress all things considered although I think that we definitely need to clean some this out and get it moving in the right direction. Patient has been using Medihoney or rather his daughter has at home she is a Engineer, civil (consulting) she works in the ICU office seen her previously as well as a patient. Subsequently after it was apparent that this was not getting better they did go on 7 June to [place] [facility] and at that point they actually put the patient on Augmentin which she started on 02-19-2023 and is still continuing that through current. His Barry recent hemoglobin A1c was 6.9 on 01-16-2023. He did have an x-ray done of the leg but this has not been released yet it still pending a reading. Currently could not tolerate the ABI screening but he seems to have good perfusion in regard to his leg I do not know that we go to gotten a lot of a good reading anyway when it comes to the ABIs even if we were able to do the screening due to the amount of lymphedema that he has with associated swelling. Patient does have a history of lymphedema, diabetes mellitus type 2, he is on oral hypoglycemic agents. He also has a history of hypertension and peripheral  vascular disease although his last ABI check in 2018 was normal according to his daughter and he has seen  vascular again at that time but has not had any recent ABIs performed. 03-09-2023 upon evaluation today patient appears to be doing well currently in regard to his wounds. He has been tolerating the dressing changes without complication. Fortunately I see signs of definite improvement and very pleased in this regard. 03-16-2023 upon evaluation today patient appears to be doing well currently in regard to his wound which is actually showing signs of improvement. Fortunately I do not see any evidence of active infection locally nor systemically at this time. 03-23-2023 upon evaluation today patient appears to be doing well currently in regard to his wound. He has been tolerating the dressing changes without complication. The wound actually showed signs of excellent improvement which is great news and very pleased in that regard. 03-30-2023 upon evaluation today patient appears to be doing well currently in regard to his wound which is actually showing signs of significant improvement. Fortunately there does not appear to be any signs of active infection locally or systemically which is great news. No fevers, chills, nausea, vomiting, or diarrhea. 7/23; this was a car door injury in the anterior mid tibia. We have been making excellent progress using collagen under Urgo K2 light compression.He has chronic edema probably lymphedema in the left leg Objective Constitutional Patient is hypertensive.. Pulse regular and within target range for patient.Marland Kitchen Respirations regular, non-labored and within target range.. Temperature is normal and within the target range for the patient.Marland Kitchen appears in no distress. Vitals Time Taken: 10:43 AM, Height: 65 in, Weight: 180 lbs, BMI: 30, Temperature: 98.6 F, Pulse: 91 bpm, Respiratory Rate: 18 breaths/min, Blood Pressure: 157/68 mmHg. General Notes: Wound exam; left anterior mid tibia. There is slight undermining laterally in the lower part of this wound however the granulation  tissue looks Joe Barry, Joe Barry ([id number]) [id number] Page 5 of 6 very healthy no evidence of surrounding infection. Reviewing the measurements of the wound shows significant improvement and continued improvement Integumentary (Hair, Skin) Wound #1 status is Open. Original cause of wound was Trauma. The date acquired was: 01/14/2023. The wound has been in treatment 5 weeks. The wound is located on the Left,Proximal,Midline Lower Leg. The wound measures 4cm length x 0.5cm width x 0.2cm depth; 1.571cm^2 area and 0.314cm^3 volume. There is Fat Layer (Subcutaneous Tissue) exposed. There is a small amount of serosanguineous drainage noted. There is medium (34-66%) red granulation within the wound bed. There is a medium (34-66%) amount of necrotic tissue within the wound bed including Adherent Slough. Assessment Active Problems ICD-10 Laceration without foreign body, left lower leg, initial encounter Non-pressure chronic ulcer of other part of left lower leg with fat layer exposed Lymphedema, not elsewhere classified Type 2 diabetes mellitus with other skin ulcer Long term (current) use of oral hypoglycemic drugs Essential (primary) hypertension Other specified peripheral vascular diseases Plan Follow-up Appointments: Return Appointment in 1 week. Nurse Visit as needed Bathing/ Shower/ Hygiene: May shower with wound dressing protected with water repellent cover or cast protector. - pt advised to not get wrap wet No tub bath. Anesthetic (Use 'Patient Medications' Section for Anesthetic Order Entry): Lidocaine applied to wound bed Edema Control - Lymphedema / Segmental Compressive Device / Other: UrgoK2 LITE - Juxta Fit right leg Elevate, Exercise Daily and Avoid Standing for Long Periods of Time. Elevate leg(s) parallel to the floor when sitting. DO YOUR BEST to sleep in the bed  at night. DO NOT sleep in your recliner. Long hours of sitting in a recliner leads  to swelling of the legs and/or potential wounds on your backside. Other: - discussed juxtalite for legs in future once swelling is down Non-Wound Condition: Apply appropriate compression. - urgo lite large used Additional non-wound orders/instructions: - AandD ointment to bilat legs prior to wrapping WOUND #1: - Lower Leg Wound Laterality: Left, Midline, Proximal Cleanser: Soap and Water 1 x Per Week/30 Days Discharge Instructions: Gently cleanse wound with antibacterial soap, rinse and pat dry prior to dressing wounds Prim Dressing: Promogran Matrix 4.34 (in) 1 x Per Week/30 Days ary Discharge Instructions: Moisten w/normal saline or sterile water; Cover wound as directed. Do not remove from wound bed. Secondary Dressing: Zetuvit Plus 4x8 (in/in) 1 x Per Week/30 Days Com pression Wrap: Urgo K2 Lite, two layer compression system, regular 1 x Per Week/30 Days 1. We have continued with Promogran under Urgo K2 lite compression 2. There is not a lot of drainage, Zetuvit may not be necessary at this point Electronic Signature(s) Signed: 04/06/2023 4:43:46 PM By: Joe Najjar MD Entered By: Joe Barry on 04/06/2023 11:16:38 -------------------------------------------------------------------------------- SuperBill Details Patient Name: Date of Service: Joe Artis [name] 04/06/2023 Joe Barry, Joe Barry ([id number]) [id number] Page 6 of 6 Medical Record Number: [id number] Patient Account Number: [id number] Date of Birth/Sex: Treating RN: Dec 20, 1933 (89 y.o. Joe Barry, Joe Barry Primary Care Provider: Sharlot Barry Other Clinician: Referring Provider: Treating Provider/Extender: Joe Barry, Joe Barry Joe Barry in Treatment: 5 Diagnosis Coding ICD-10 Codes Code Description [phone] Laceration without foreign body, left lower leg, initial encounter L97.822 Non-pressure chronic ulcer of other part of left lower leg with fat layer exposed I89.0 Lymphedema, not  elsewhere classified E11.622 Type 2 diabetes mellitus with other skin ulcer Z79.84 Long term (current) use of oral hypoglycemic drugs I10 Essential (primary) hypertension I73.89 Other specified peripheral vascular diseases Facility Procedures : CPT4 Code: [id number] Description: 46962 - WOUND CARE VISIT-LEV 2 EST PT Modifier: Quantity: 1 Physician Procedures : CPT4 Code Description Modifier [id number] - WC PHYS LEVEL 3 - EST PT ICD-10 Diagnosis Description S81.812A Laceration without foreign body, left lower leg, initial encounter L97.822 Non-pressure chronic ulcer of other part of left lower leg with fat  layer exposed Quantity: 1 Electronic Signature(s) Signed: 04/06/2023 4:43:46 PM By: Joe Najjar MD Entered By: Joe Barry on 04/06/2023 11:17:57

## 2023-04-13 ENCOUNTER — Encounter: Payer: Medicare PPO | Admitting: Physician Assistant

## 2023-04-13 DIAGNOSIS — I10 Essential (primary) hypertension: Secondary | ICD-10-CM | POA: Diagnosis not present

## 2023-04-13 DIAGNOSIS — L97822 Non-pressure chronic ulcer of other part of left lower leg with fat layer exposed: Secondary | ICD-10-CM | POA: Diagnosis not present

## 2023-04-13 DIAGNOSIS — Z7984 Long term (current) use of oral hypoglycemic drugs: Secondary | ICD-10-CM | POA: Diagnosis not present

## 2023-04-13 DIAGNOSIS — E11622 Type 2 diabetes mellitus with other skin ulcer: Secondary | ICD-10-CM | POA: Diagnosis not present

## 2023-04-13 DIAGNOSIS — S81812A Laceration without foreign body, left lower leg, initial encounter: Secondary | ICD-10-CM | POA: Diagnosis not present

## 2023-04-13 DIAGNOSIS — I890 Lymphedema, not elsewhere classified: Secondary | ICD-10-CM | POA: Diagnosis not present

## 2023-04-13 DIAGNOSIS — S81802A Unspecified open wound, left lower leg, initial encounter: Secondary | ICD-10-CM | POA: Diagnosis not present

## 2023-04-13 NOTE — Progress Notes (Signed)
METRO, PICKETT ([id number]) [id number] Page 1 of 9 Visit Report for 04/13/2023 Arrival Information Details Patient Name: Date of Service: Joe Artis [facility] R. 04/13/2023 12:00 PM Medical Record Number: [id number] Patient Account Number: [id number] Date of Birth/Sex: Treating RN: 1934/05/31 (89 y.o. Joe Barry Primary Care Joe Barry: Joe Barry Other Clinician: Referring Joe Barry: Treating Joe Barry/Extender: Joe Barry in Treatment: 6 Visit Information History Since Last Visit Added or deleted any medications: No Patient Arrived: Wheel Chair Any new allergies or adverse reactions: No Arrival Time: 12:14 Had a fall or experienced change in No Accompanied By: family activities of daily living that may affect Transfer Assistance: EasyPivot Patient Lift risk of falls: Patient Identification Verified: Yes Hospitalized since last visit: No Secondary Verification Process Completed: Yes Has Dressing in Place as Prescribed: Yes Patient Requires Transmission-Based Precautions: No Has Compression in Place as Prescribed: Yes Patient Has Alerts: No Pain Present Now: No Electronic Signature(s) Signed: 04/13/2023 4:24:49 PM By: [name] Entered By: [name] on 04/13/2023 12:14:50 -------------------------------------------------------------------------------- Clinic Level of Care Assessment Details Patient Name: Date of Service: Joe Artis [facility] R. 04/13/2023 12:00 PM Medical Record Number: [id number] Patient Account Number: [id number] Date of Birth/Sex: Treating RN: 23-Aug-1934 (89 y.o. Joe Barry Primary Care Joe Barry: Joe Barry Other Clinician: Referring Joe Barry: Treating Joe Barry/Extender: Joe Barry in Treatment: 6 Clinic Level of Care Assessment Items TOOL 1 Quantity Score []  - 0 Use when EandM and Procedure is performed on INITIAL visit ASSESSMENTS - Nursing Assessment /  Reassessment []  - 0 General Physical Exam (combine w/ comprehensive assessment (listed just below) when performed on new pt. evals) []  - 0 Comprehensive Assessment (HX, ROS, Risk Assessments, Wounds Hx, etc.) ASSESSMENTS - Wound and Skin Assessment / Reassessment []  - 0 Dermatologic / Skin Assessment (not related to wound area) Joe Barry, Joe Barry ([id number]) [id number] Page 2 of 9 ASSESSMENTS - Ostomy and/or Continence Assessment and Care []  - 0 Incontinence Assessment and Management []  - 0 Ostomy Care Assessment and Management (repouching, etc.) PROCESS - Coordination of Care []  - 0 Simple Patient / Family Education for ongoing care []  - 0 Complex (extensive) Patient / Family Education for ongoing care []  - 0 Staff obtains Chiropractor, Records, T Results / Process Orders est []  - 0 Staff telephones HHA, Nursing Homes / Clarify orders / etc []  - 0 Routine Transfer to another Facility (non-emergent condition) []  - 0 Routine Hospital Admission (non-emergent condition) []  - 0 New Admissions / Manufacturing engineer / Ordering NPWT Apligraf, etc. , []  - 0 Emergency Hospital Admission (emergent condition) PROCESS - Special Needs []  - 0 Pediatric / Minor Patient Management []  - 0 Isolation Patient Management []  - 0 Hearing / Language / Visual special needs []  - 0 Assessment of Community assistance (transportation, D/C planning, etc.) []  - 0 Additional assistance / Altered mentation []  - 0 Support Surface(s) Assessment (bed, cushion, seat, etc.) INTERVENTIONS - Miscellaneous []  - 0 External ear exam []  - 0 Patient Transfer (multiple staff / Nurse, adult / Similar devices) []  - 0 Simple Staple / Suture removal (25 or less) []  - 0 Complex Staple / Suture removal (26 or more) []  - 0 Hypo/Hyperglycemic Management (do not check if billed separately) []  - 0 Ankle / Brachial Index (ABI) - do not check if billed separately Has the patient been seen at  the hospital within the last three years: Yes Total Score: 0 Level Of Care: ____ Electronic Signature(s) Signed: 04/13/2023 4:24:49 PM  By: [name] Entered By: [name] on 04/13/2023 12:34:54 -------------------------------------------------------------------------------- Compression Therapy Details Patient Name: Date of Service: Joe Artis [facility] R. 04/13/2023 12:00 PM Medical Record Number: [id number] Patient Account Number: [id number] Date of Birth/Sex: Treating RN: 12-Sep-1934 (89 y.o. Joe Barry Primary Care Joe Barry: Joe Barry Other Clinician: Referring Joe Barry: Treating Joe Barry/Extender: Joe Barry in Treatment: 6 Compression Therapy Performed for Wound Assessment: Wound #1 Left,Proximal,Midline Lower Leg Performed By: Clinician [name], RN Compression TypePerrin Maltese Joe Barry, Joe Barry ([id number]) [id number] Page 3 of 9 Post Procedure Diagnosis Same as Pre-procedure Electronic Signature(s) Signed: 04/13/2023 4:24:49 PM By: [name] Entered By: [name] on 04/13/2023 12:31:07 -------------------------------------------------------------------------------- Encounter Discharge Information Details Patient Name: Date of Service: Joe Artis [name] 04/13/2023 12:00 PM Medical Record Number: [id number] Patient Account Number: [id number] Date of Birth/Sex: Treating RN: 07-Nov-1933 (89 y.o. Joe Barry Primary Care Joe Barry: Joe Barry Other Clinician: Referring Joe Barry: Treating Joe Barry/Extender: Joe Barry in Treatment: 6 Encounter Discharge Information Items Post Procedure Vitals Discharge Condition: Stable Temperature (F): 97.6 Ambulatory Status: Wheelchair Pulse (bpm): 92 Discharge Destination: Home Respiratory Rate (breaths/min): 18 Transportation: Private Auto Blood Pressure (mmHg): 171/68 Accompanied By: family Schedule Follow-up Appointment:  Yes Clinical Summary of Care: Electronic Signature(s) Signed: 04/13/2023 4:24:49 PM By: [name] Entered By: [name] on 04/13/2023 12:36:09 -------------------------------------------------------------------------------- Lower Extremity Assessment Details Patient Name: Date of Service: Joe Artis [place] R. 04/13/2023 12:00 PM Medical Record Number: [id number] Patient Account Number: [id number] Date of Birth/Sex: Treating RN: [birth year] (89 y.o. Joe Barry Primary Care Gelisa Tieken: Joe Barry Other Clinician: Referring Tyrelle Raczka: Treating Jesson Foskey/Extender: Joe Barry in Treatment: 6 Edema Assessment Assessed: [Left: No] Franne Forts: No] Edema: [Left: Ye] [Right: s] [address] KASHMERE, COVIN ([id number]) [id number] Page 4 of 9 Left: Right: Point of Measurement: 34 cm From Medial Instep 40.2 cm Ankle Left: Right: Point of Measurement: 12 cm From Medial Instep 30.4 cm Vascular Assessment Pulses: Dorsalis Pedis Palpable: [Left:Yes] Extremity colors, hair growth, and conditions: Extremity Color: [Left:Normal] Hair Growth on Extremity: [Left:No] Temperature of Extremity: [Left:Warm < 3 seconds] Toe Nail Assessment Left: Right: Thick: Yes Discolored: Yes Deformed: Yes Improper Length and Hygiene: Yes Electronic Signature(s) Signed: 04/13/2023 4:24:49 PM By: [name] Entered By: [name] on 04/13/2023 12:25:48 -------------------------------------------------------------------------------- Multi Wound Chart Details Patient Name: Date of Service: Joe Artis [name] 04/13/2023 12:00 PM Medical Record Number: [id number] Patient Account Number: [id number] Date of Birth/Sex: Treating RN: 04-28-1934 (89 y.o. Joe Barry Primary Care Berlinda Farve: Joe Barry Other Clinician: Referring Allyanna Appleman: Treating Lianette Broussard/Extender: Joe Barry in Treatment: 6 Vital Signs Height(in): 65 Pulse(bpm):  92 Weight(lbs): 180 Blood Pressure(mmHg): 171/68 Body Mass Index(BMI): 30 Temperature(F): 97.6 Respiratory Rate(breaths/min): 18 [1:Photos:] [N/A:N/A] Left, Proximal, Midline Lower Leg N/A N/A Wound Location: Trauma N/A N/A Wounding Event: Trauma, Other N/A N/A Primary Etiology: Joe Barry, Joe Barry ([id number]) [id number] Page 5 of 9 Cataracts, Lymphedema, N/A N/A Comorbid History: Hypertension, Peripheral Venous Disease, Type II Diabetes 01/14/2023 N/A N/A Date Acquired: 6 N/A N/A Weeks of Treatment: Open N/A N/A Wound Status: No N/A N/A Wound Recurrence: 3.3x0.5x0.3 N/A N/A Measurements L x W x D (cm) 1.296 N/A N/A A (cm) : rea 0.389 N/A N/A Volume (cm) : 88.00% N/A N/A % Reduction in A rea: 88.00% N/A N/A % Reduction in Volume: 1 Starting Position 1 (o'clock): 3 Ending Position 1 (o'clock): 0.2 Maximum Distance 1 (cm): Yes N/A N/A Undermining:  Full Thickness Without Exposed N/A N/A Classification: Support Structures Medium N/A N/A Exudate Amount: Serosanguineous N/A N/A Exudate Type: red, brown N/A N/A Exudate Color: Small (1-33%) N/A N/A Granulation Amount: Red N/A N/A Granulation Quality: Large (67-100%) N/A N/A Necrotic Amount: Fat Layer (Subcutaneous Tissue): Yes N/A N/A Exposed Structures: Fascia: No Tendon: No Muscle: No Joint: No Bone: No Small (1-33%) N/A N/A Epithelialization: Treatment Notes Electronic Signature(s) Signed: 04/13/2023 4:24:49 PM By: [name] Entered By: [name] on 04/13/2023 12:30:52 -------------------------------------------------------------------------------- Multi-Disciplinary Care Plan Details Patient Name: Date of Service: Joe Artis [name] 04/13/2023 12:00 PM Medical Record Number: [id number] Patient Account Number: [id number] Date of Birth/Sex: Treating RN: 08-May-1934 (89 y.o. Joe Barry Primary Care Hadlyn Amero: Joe Barry Other Clinician: Referring  Venetta Knee: Treating Russell Quinney/Extender: Joe Barry in Treatment: 6 Active Inactive Venous Leg Ulcer Nursing Diagnoses: Knowledge deficit related to disease process and management Potential for venous Insuffiency (use before diagnosis confirmed) Goals: Patient will maintain optimal edema control Date Initiated: 02/26/2023 Target Resolution Date: 04/23/2023 Goal Status: Active Patient/caregiver will verbalize understanding of disease process and disease management Date Initiated: 02/26/2023 Date Inactivated: 02/26/2023 Target Resolution Date: 02/26/2023 Goal Status: Unmet Unmet Reason: comorbidities Joe Barry, Joe Barry ([id number]) [id number] Page 6 of 9 Interventions: Assess peripheral edema status every visit. Compression as ordered Treatment Activities: Therapeutic compression applied : 02/26/2023 Notes: Wound/Skin Impairment Nursing Diagnoses: Impaired tissue integrity Knowledge deficit related to ulceration/compromised skin integrity Goals: Ulcer/skin breakdown will have a volume reduction of 30% by week 4 Date Initiated: 02/26/2023 Date Inactivated: 03/23/2023 Target Resolution Date: 03/26/2023 Goal Status: Met Ulcer/skin breakdown will have a volume reduction of 50% by week 8 Date Initiated: 02/26/2023 Target Resolution Date: 04/23/2023 Goal Status: Active Ulcer/skin breakdown will have a volume reduction of 80% by week 12 Date Initiated: 02/26/2023 Target Resolution Date: 05/21/2023 Goal Status: Active Ulcer/skin breakdown will heal within 14 weeks Date Initiated: 02/26/2023 Target Resolution Date: 06/04/2023 Goal Status: Active Interventions: Assess patient/caregiver ability to obtain necessary supplies Assess patient/caregiver ability to perform ulcer/skin care regimen upon admission and as needed Assess ulceration(s) every visit Provide education on ulcer and skin care Treatment Activities: Skin care regimen initiated :  02/26/2023 Notes: Electronic Signature(s) Signed: 04/13/2023 4:24:49 PM By: [name] Entered By: [name] on 04/13/2023 12:35:08 -------------------------------------------------------------------------------- Pain Assessment Details Patient Name: Date of Service: Joe Artis [name] 04/13/2023 12:00 PM Medical Record Number: [id number] Patient Account Number: [id number] Date of Birth/Sex: Treating RN: 04/01/1934 (89 y.o. Joe Barry Primary Care Traeger Sultana: Joe Barry Other Clinician: Referring Laquisha Northcraft: Treating Nicky Kras/Extender: Joe Barry in Treatment: 6 Active Problems Location of Pain Severity and Description of Pain Patient Has Paino No Site Locations Rate the pain. Joe Barry, Joe Barry ([id number]) [id number] Page 7 of 9 Rate the pain. Current Pain Level: 0 Pain Management and Medication Current Pain Management: Electronic Signature(s) Signed: 04/13/2023 4:24:49 PM By: [name] Entered By: [name] on 04/13/2023 12:15:26 -------------------------------------------------------------------------------- Patient/Caregiver Education Details Patient Name: Date of Service: Joe Artis [name] 7/30/2024andnbsp12:00 PM Medical Record Number: [id number] Patient Account Number: [id number] Date of Birth/Gender: Treating RN: 1934-08-10 (89 y.o. Joe Barry Primary Care Physician: Joe Barry Other Clinician: Referring Physician: Treating Physician/Extender: Joe Barry in Treatment: 6 Education Assessment Education Provided To: Patient Education Topics Provided Wound Debridement: Handouts: Wound Debridement Methods: Explain/Verbal Responses: State content correctly Wound/Skin Impairment: Handouts: Caring for Your Ulcer Methods: Explain/Verbal Responses: State content correctly Electronic Signature(s) Signed: 04/13/2023  4:24:49 PM By: [name] Entered By: [name] on 04/13/2023 12:35:28 Joe Barry ([id number]) [id number] Page 8 of 9 -------------------------------------------------------------------------------- Wound Assessment Details Patient Name: Date of Service: Joe Artis [facility] R. 04/13/2023 12:00 PM Medical Record Number: [id number] Patient Account Number: [id number] Date of Birth/Sex: Treating RN: 1934-02-10 (89 y.o. Joe Barry Primary Care Ellayna Hilligoss: Joe Barry Other Clinician: Referring Joe Barry: Treating Brixton Schnapp/Extender: Joe Barry in Treatment: 6 Wound Status Wound Number: 1 Primary Trauma, Other Etiology: Wound Location: Left, Proximal, Midline Lower Leg Wound Open Wounding Event: Trauma Status: Date Acquired: 01/14/2023 Comorbid Cataracts, Lymphedema, Hypertension, Peripheral Venous Weeks Of Treatment: 6 History: Disease, Type II Diabetes Clustered Wound: No Photos Wound Measurements Length: (cm) 3.3 Width: (cm) 0.5 Depth: (cm) 0.3 Area: (cm) 1.296 Volume: (cm) 0.389 % Reduction in Area: 88% % Reduction in Volume: 88% Epithelialization: Small (1-33%) Tunneling: No Undermining: Yes Starting Position (o'clock): 1 Ending Position (o'clock): 3 Maximum Distance: (cm) 0.2 Wound Description Classification: Full Thickness Without Exposed Suppor Exudate Amount: Medium Exudate Type: Serosanguineous Exudate Color: red, brown t Structures Foul Odor After Cleansing: No Slough/Fibrino Yes Wound Bed Granulation Amount: Small (1-33%) Exposed Structure Granulation Quality: Red Fascia Exposed: No Necrotic Amount: Large (67-100%) Fat Layer (Subcutaneous Tissue) Exposed: Yes Necrotic Quality: Adherent Slough Tendon Exposed: No Muscle Exposed: No Joint Exposed: No Bone Exposed: No Treatment Notes Wound #1 (Lower Leg) Wound Laterality: Left, Midline, Proximal Joe Barry, Joe Barry ([id number]) [id number] Page 9 of 9 Soap and  Water Discharge Instruction: Gently cleanse wound with antibacterial soap, rinse and pat dry prior to dressing wounds Peri-Wound Care Topical Primary Dressing Promogran Matrix 4.34 (in) Discharge Instruction: Moisten w/normal saline or sterile water; Cover wound as directed. Do not remove from wound bed. Secondary Dressing Zetuvit Plus 4x8 (in/in) Secured With Compression Wrap Urgo K2 Lite, two layer compression system, regular Compression Stockings Add-Ons Electronic Signature(s) Signed: 04/13/2023 4:24:49 PM By: [name] Entered By: [name] on 04/13/2023 12:25:30 -------------------------------------------------------------------------------- Vitals Details Patient Name: Date of Service: Joe Artis [name] 04/13/2023 12:00 PM Medical Record Number: [id number] Patient Account Number: [id number] Date of Birth/Sex: Treating RN: 30-Jul-1934 (89 y.o. Joe Barry Primary Care Kenzleigh Sedam: Joe Barry Other Clinician: Referring Hazelee Harbold: Treating Mialee Weyman/Extender: Joe Barry in Treatment: 6 Vital Signs Time Taken: 12:10 Temperature (F): 97.6 Height (in): 65 Pulse (bpm): 92 Weight (lbs): 180 Respiratory Rate (breaths/min): 18 Body Mass Index (BMI): 30 Blood Pressure (mmHg): 171/68 Reference Range: 80 - 120 mg / dl Notes pt states asymptomatic, pt states did not take BP meds today Electronic Signature(s) Signed: 04/13/2023 4:24:49 PM By: [name] Entered By: [name] on 04/13/2023 12:15:20

## 2023-04-13 NOTE — Progress Notes (Signed)
Joe Barry ([id number]) [id number] Page 1 of 8 Visit Report for 04/13/2023 Chief Complaint Document Details Patient Name: Date of Service: Joe Barry [facility] R. 04/13/2023 12:00 PM Medical Record Number: [id number] Patient Account Number: [id number] Date of Birth/Sex: Treating RN: 09-07-1934 (89 y.o. Joe Barry Primary Care Provider: Sharlot Barry Other Clinician: Referring Provider: Treating Provider/Extender: Joe Barry in Treatment: 6 Information Obtained from: Patient Chief Complaint Left LE Ulcers Electronic Signature(s) Signed: 04/13/2023 12:29:26 PM By: Joe Derry PA-C Entered By: Joe Barry on 04/13/2023 12:29:26 -------------------------------------------------------------------------------- Debridement Details Patient Name: Date of Service: Joe Barry [name] 04/13/2023 12:00 PM Medical Record Number: [id number] Patient Account Number: [id number] Date of Birth/Sex: Treating RN: 1934-05-24 (89 y.o. Joe Barry Primary Care Provider: Sharlot Barry Other Clinician: Referring Provider: Treating Provider/Extender: Joe Barry in Treatment: 6 Debridement Performed for Assessment: Wound #1 Left,Proximal,Midline Lower Leg Performed By: Physician Joe Derry, PA-C Debridement Type: Debridement Level of Consciousness (Pre-procedure): Awake and Alert Pre-procedure Verification/Time Out Yes - 12:34 Taken: Pain Control: Lidocaine 4% T opical Solution Percent of Wound Bed Debrided: 100% T Area Debrided (cm): otal 1.3 Tissue and other material debrided: Viable, Non-Viable, Slough, Subcutaneous, Slough Level: Skin/Subcutaneous Tissue Debridement Description: Excisional Instrument: Curette Bleeding: Moderate Hemostasis Achieved: Pressure Response to Treatment: Procedure was tolerated well Level of Consciousness (Post- Awake and Alert procedure): Joe Barry, Joe Barry ([id number])  [id number] Page 2 of 8 Post Debridement Measurements of Total Wound Length: (cm) 3.3 Width: (cm) 0.5 Depth: (cm) 0.3 Volume: (cm) 0.389 Character of Wound/Ulcer Post Debridement: Stable Post Procedure Diagnosis Same as Pre-procedure Electronic Signature(s) Signed: 04/13/2023 4:24:49 PM By: Joe Barry Signed: 04/13/2023 4:58:57 PM By: Joe Derry PA-C Entered By: Joe Barry on 04/13/2023 12:34:44 -------------------------------------------------------------------------------- HPI Details Patient Name: Date of Service: Joe Barry [name] 04/13/2023 12:00 PM Medical Record Number: [id number] Patient Account Number: [id number] Date of Birth/Sex: Treating RN: 1934-09-04 (89 y.o. Joe Barry Primary Care Provider: Sharlot Barry Other Clinician: Referring Provider: Treating Provider/Extender: Joe Barry in Treatment: 6 History of Present Illness HPI Description: 02-26-2023 upon evaluation today patient presents for initial inspection here in our clinic concerning issues that he has been having with a wound on his left anterior lower leg. This actually began on 01-14-2023 when the patient was getting out of the car and subsequently they were turning the wheelchair around to move around the door and got too close to the edge of the door and the corner at the bottom actually cut his leg. This because of his lymphedema has been a very hard wound to heal following. Fortunately there does not appear to be any signs of infection systemically which is good news. With that being said even locally I do not see any signs of infection at this point. I do believe that he is making fairly good progress all things considered although I think that we definitely need to clean some this out and get it moving in the right direction. Patient has been using Medihoney or rather his daughter has at home she is a Engineer, civil (consulting) she works in the ICU office seen her  previously as well as a patient. Subsequently after it was apparent that this was not getting better they did go on 7 June to [place] [facility] and at that point they actually put the patient on Augmentin which she started on 02-19-2023 and is still continuing that through current. His most recent hemoglobin A1c  was 6.9 on 01-16-2023. He did have an x-ray done of the leg but this has not been released yet it still pending a reading. Currently could not tolerate the ABI screening but he seems to have good perfusion in regard to his leg I do not know that we go to gotten a lot of a good reading anyway when it comes to the ABIs even if we were able to do the screening due to the amount of lymphedema that he has with associated swelling. Patient does have a history of lymphedema, diabetes mellitus type 2, he is on oral hypoglycemic agents. He also has a history of hypertension and peripheral vascular disease although his last ABI check in 2018 was normal according to his daughter and he has seen vascular again at that time but has not had any recent ABIs performed. 03-09-2023 upon evaluation today patient appears to be doing well currently in regard to his wounds. He has been tolerating the dressing changes without complication. Fortunately I see signs of definite improvement and very pleased in this regard. 03-16-2023 upon evaluation today patient appears to be doing well currently in regard to his wound which is actually showing signs of improvement. Fortunately I do not see any evidence of active infection locally nor systemically at this time. 03-23-2023 upon evaluation today patient appears to be doing well currently in regard to his wound. He has been tolerating the dressing changes without complication. The wound actually showed signs of excellent improvement which is great news and very pleased in that regard. 03-30-2023 upon evaluation today patient appears to be doing well currently in regard to  his wound which is actually showing signs of significant improvement. Fortunately there does not appear to be any signs of active infection locally or systemically which is great news. No fevers, chills, nausea, vomiting, or diarrhea. 7/23; this was a car door injury in the anterior mid tibia. We have been making excellent progress using collagen under Urgo K2 light compression.He has chronic edema probably lymphedema in the left leg 04-13-2023 upon evaluation today patient appears to be doing well currently in regard to his wound. He has been tolerating the dressing changes without complication. Fortunately I do not see any evidence of infection locally or systemically which is great news. No fevers, chills, nausea, vomiting, or diarrhea. Electronic Signature(s) Joe Barry, Joe Barry ([id number]) [id number] Page 3 of 8 Signed: 04/13/2023 1:36:08 PM By: Joe Derry PA-C Entered By: Joe Barry on 04/13/2023 13:36:08 -------------------------------------------------------------------------------- Physical Exam Details Patient Name: Date of Service: Joe Barry [place] R. 04/13/2023 12:00 PM Medical Record Number: [id number] Patient Account Number: [id number] Date of Birth/Sex: Treating RN: Sep 27, 1933 (89 y.o. Joe Barry Primary Care Provider: Sharlot Barry Other Clinician: Referring Provider: Treating Provider/Extender: Joe Barry in Treatment: 6 Constitutional Well-nourished and well-hydrated in no acute distress. Respiratory normal breathing without difficulty. Psychiatric this patient is able to make decisions and demonstrates good insight into disease process. Alert and Oriented x 3. pleasant and cooperative. Notes Upon inspection patient's wound bed did have signs of necrotic debris along with some of the dressing material slough and biofilm noted on the surface of the wound I performed debridement to clear this away postdebridement this  looks to be doing much better. Electronic Signature(s) Signed: 04/13/2023 1:36:48 PM By: Joe Derry PA-C Entered By: Joe Barry on 04/13/2023 13:36:48 -------------------------------------------------------------------------------- Physician Orders Details Patient Name: Date of Service: Joe Barry [name] 04/13/2023 12:00 PM Medical Record Number: [id number]  Patient Account Number: [id number] Date of Birth/Sex: Treating RN: [birth year] (89 y.o. Joe Barry Primary Care Provider: Sharlot Barry Other Clinician: Referring Provider: Treating Provider/Extender: Joe Barry in Treatment: 6 Verbal / Phone Orders: No Diagnosis Coding ICD-10 Coding Code Description [phone] Laceration without foreign body, left lower leg, initial encounter L97.822 Non-pressure chronic ulcer of other part of left lower leg with fat layer exposed I89.0 Lymphedema, not elsewhere classified Joe Barry, Joe Barry ([id number]) [id number] Page 4 of 8 E11.622 Type 2 diabetes mellitus with other skin ulcer Z79.84 Long term (current) use of oral hypoglycemic drugs I10 Essential (primary) hypertension I73.89 Other specified peripheral vascular diseases Follow-up Appointments Return Appointment in 1 week. Nurse Visit as needed Bathing/ Shower/ Hygiene May shower with wound dressing protected with water repellent cover or cast protector. - pt advised to not get wrap wet No tub bath. Anesthetic (Use 'Patient Medications' Section for Anesthetic Order Entry) Lidocaine applied to wound bed Edema Control - Lymphedema / Segmental Compressive Device / Other UrgoK2 LITE - Juxta Fit right leg Elevate, Exercise Daily and A void Standing for Long Periods of Time. Elevate leg(s) parallel to the floor when sitting. DO YOUR BEST to sleep in the bed at night. DO NOT sleep in your recliner. Long hours of sitting in a recliner leads to swelling of the legs and/or potential wounds  on your backside. Other: - juxtalite wrap on RLL Non-Wound Condition Right Lower Extremity pply appropriate compression. - urgo lite large used A dditional non-wound orders/instructions: - AandD ointment to bilat legs prior to wrapping A Wound Treatment Wound #1 - Lower Leg Wound Laterality: Left, Midline, Proximal Cleanser: Soap and Water 1 x Per Week/30 Days Discharge Instructions: Gently cleanse wound with antibacterial soap, rinse and pat dry prior to dressing wounds Prim Dressing: Promogran Matrix 4.34 (in) 1 x Per Week/30 Days ary Discharge Instructions: Moisten w/normal saline or sterile water; Cover wound as directed. Do not remove from wound bed. Secondary Dressing: Zetuvit Plus 4x8 (in/in) 1 x Per Week/30 Days Compression Wrap: Urgo K2 Lite, two layer compression system, regular 1 x Per Week/30 Days Electronic Signature(s) Signed: 04/13/2023 4:24:49 PM By: Joe Barry Signed: 04/13/2023 4:58:57 PM By: Joe Derry PA-C Entered By: Joe Barry on 04/13/2023 12:33:44 -------------------------------------------------------------------------------- Problem List Details Patient Name: Date of Service: Joe Barry [name] 04/13/2023 12:00 PM Medical Record Number: [id number] Patient Account Number: [id number] Date of Birth/Sex: Treating RN: 1934/07/08 (89 y.o. Joe Barry Primary Care Provider: Sharlot Barry Other Clinician: Referring Provider: Treating Provider/Extender: Joe Barry in Treatment: 6 Active Problems ICD-10 Encounter Code Description Active Date MDM Diagnosis Joe Barry, Joe Barry ([id number]) [id number] Page 5 of 8 [phone] Laceration without foreign body, left lower leg, initial encounter 02/26/2023 No Yes L97.822 Non-pressure chronic ulcer of other part of left lower leg with fat layer exposed6/14/2024 No Yes I89.0 Lymphedema, not elsewhere classified 02/26/2023 No Yes E11.622 Type 2 diabetes mellitus with  other skin ulcer 02/26/2023 No Yes Z79.84 Long term (current) use of oral hypoglycemic drugs 02/26/2023 No Yes I10 Essential (primary) hypertension 02/26/2023 No Yes I73.89 Other specified peripheral vascular diseases 02/26/2023 No Yes Inactive Problems Resolved Problems Electronic Signature(s) Signed: 04/13/2023 12:29:22 PM By: Joe Derry PA-C Entered By: Joe Barry on 04/13/2023 12:29:22 -------------------------------------------------------------------------------- Progress Note Details Patient Name: Date of Service: Joe Barry [name] 04/13/2023 12:00 PM Medical Record Number: [id number] Patient Account Number: [id number] Date of Birth/Sex: Treating RN: [birth year] (89 y.o. Joe Barry  Primary Care Provider: Sharlot Barry Other Clinician: Referring Provider: Treating Provider/Extender: Joe Barry in Treatment: 6 Subjective Chief Complaint Information obtained from Patient Left LE Ulcers History of Present Illness (HPI) 02-26-2023 upon evaluation today patient presents for initial inspection here in our clinic concerning issues that he has been having with a wound on his left anterior lower leg. This actually began on 01-14-2023 when the patient was getting out of the car and subsequently they were turning the wheelchair around to move around the door and got too close to the edge of the door and the corner at the bottom actually cut his leg. This because of his lymphedema has been a very hard wound to heal following. Fortunately there does not appear to be any signs of infection systemically which is good news. With that being said even locally I do not see any signs of infection at this point. I do believe that he is making fairly good progress all things considered although I think that we definitely need to clean some this out and get it moving in the right direction. Patient has been using Medihoney or rather his daughter has at home she is a Engineer, civil (consulting) she works  in the ICU office seen her previously as well as a patient. Subsequently after it was apparent that this was not getting better they did go on 7 June to [place] [facility] and at that point they actually put the Joe Barry, Joe Barry ([id number]) [id number] Page 6 of 8 patient on Augmentin which she started on 02-19-2023 and is still continuing that through current. His most recent hemoglobin A1c was 6.9 on 01-16-2023. He did have an x-ray done of the leg but this has not been released yet it still pending a reading. Currently could not tolerate the ABI screening but he seems to have good perfusion in regard to his leg I do not know that we go to gotten a lot of a good reading anyway when it comes to the ABIs even if we were able to do the screening due to the amount of lymphedema that he has with associated swelling. Patient does have a history of lymphedema, diabetes mellitus type 2, he is on oral hypoglycemic agents. He also has a history of hypertension and peripheral vascular disease although his last ABI check in 2018 was normal according to his daughter and he has seen vascular again at that time but has not had any recent ABIs performed. 03-09-2023 upon evaluation today patient appears to be doing well currently in regard to his wounds. He has been tolerating the dressing changes without complication. Fortunately I see signs of definite improvement and very pleased in this regard. 03-16-2023 upon evaluation today patient appears to be doing well currently in regard to his wound which is actually showing signs of improvement. Fortunately I do not see any evidence of active infection locally nor systemically at this time. 03-23-2023 upon evaluation today patient appears to be doing well currently in regard to his wound. He has been tolerating the dressing changes without complication. The wound actually showed signs of excellent improvement which is great news and very pleased  in that regard. 03-30-2023 upon evaluation today patient appears to be doing well currently in regard to his wound which is actually showing signs of significant improvement. Fortunately there does not appear to be any signs of active infection locally or systemically which is great news. No fevers, chills, nausea, vomiting, or diarrhea. 7/23; this was a car  door injury in the anterior mid tibia. We have been making excellent progress using collagen under Urgo K2 light compression.He has chronic edema probably lymphedema in the left leg 04-13-2023 upon evaluation today patient appears to be doing well currently in regard to his wound. He has been tolerating the dressing changes without complication. Fortunately I do not see any evidence of infection locally or systemically which is great news. No fevers, chills, nausea, vomiting, or diarrhea. Objective Constitutional Well-nourished and well-hydrated in no acute distress. Vitals Time Taken: 12:10 PM, Height: 65 in, Weight: 180 lbs, BMI: 30, Temperature: 97.6 F, Pulse: 92 bpm, Respiratory Rate: 18 breaths/min, Blood Pressure: 171/68 mmHg. General Notes: pt states asymptomatic, pt states did not take BP meds today Respiratory normal breathing without difficulty. Psychiatric this patient is able to make decisions and demonstrates good insight into disease process. Alert and Oriented x 3. pleasant and cooperative. General Notes: Upon inspection patient's wound bed did have signs of necrotic debris along with some of the dressing material slough and biofilm noted on the surface of the wound I performed debridement to clear this away postdebridement this looks to be doing much better. Integumentary (Hair, Skin) Wound #1 status is Open. Original cause of wound was Trauma. The date acquired was: 01/14/2023. The wound has been in treatment 6 weeks. The wound is located on the Left,Proximal,Midline Lower Leg. The wound measures 3.3cm length x 0.5cm width x  0.3cm depth; 1.296cm^2 area and 0.389cm^3 volume. There is Fat Layer (Subcutaneous Tissue) exposed. There is no tunneling noted, however, there is undermining starting at 1:00 and ending at 3:00 with a maximum distance of 0.2cm. There is a medium amount of serosanguineous drainage noted. There is small (1-33%) red granulation within the wound bed. There is a large (67-100%) amount of necrotic tissue within the wound bed including Adherent Slough. Assessment Active Problems ICD-10 Laceration without foreign body, left lower leg, initial encounter Non-pressure chronic ulcer of other part of left lower leg with fat layer exposed Lymphedema, not elsewhere classified Type 2 diabetes mellitus with other skin ulcer Long term (current) use of oral hypoglycemic drugs Essential (primary) hypertension Other specified peripheral vascular diseases Procedures Wound #1 Pre-procedure diagnosis of Wound #1 is a Trauma, Other located on the Left,Proximal,Midline Lower Leg . There was a Excisional Skin/Subcutaneous Tissue Joe Barry, Joe Barry ([id number]) [id number] Page 7 of 8 Debridement with a total area of 1.3 sq cm performed by Joe Derry, PA-C. With the following instrument(s): Curette to remove Viable and Non-Viable tissue/material. Material removed includes Subcutaneous Tissue and Slough and after achieving pain control using Lidocaine 4% T opical Solution. No specimens were taken. A time out was conducted at 12:34, prior to the start of the procedure. A Moderate amount of bleeding was controlled with Pressure. The procedure was tolerated well. Post Debridement Measurements: 3.3cm length x 0.5cm width x 0.3cm depth; 0.389cm^3 volume. Character of Wound/Ulcer Post Debridement is stable. Post procedure Diagnosis Wound #1: Same as Pre-Procedure Pre-procedure diagnosis of Wound #1 is a Trauma, Other located on the Left,Proximal,Midline Lower Leg . There was a Double Layer Compression  Therapy Procedure by Joe Pih, RN. Post procedure Diagnosis Wound #1: Same as Pre-Procedure Plan Follow-up Appointments: Return Appointment in 1 week. Nurse Visit as needed Bathing/ Shower/ Hygiene: May shower with wound dressing protected with water repellent cover or cast protector. - pt advised to not get wrap wet No tub bath. Anesthetic (Use 'Patient Medications' Section for Anesthetic Order Entry): Lidocaine applied to wound bed Edema Control -  Lymphedema / Segmental Compressive Device / Other: UrgoK2 LITE - Juxta Fit right leg Elevate, Exercise Daily and Avoid Standing for Long Periods of Time. Elevate leg(s) parallel to the floor when sitting. DO YOUR BEST to sleep in the bed at night. DO NOT sleep in your recliner. Long hours of sitting in a recliner leads to swelling of the legs and/or potential wounds on your backside. Other: - juxtalite wrap on RLL Non-Wound Condition: Apply appropriate compression. - urgo lite large used Additional non-wound orders/instructions: - AandD ointment to bilat legs prior to wrapping WOUND #1: - Lower Leg Wound Laterality: Left, Midline, Proximal Cleanser: Soap and Water 1 x Per Week/30 Days Discharge Instructions: Gently cleanse wound with antibacterial soap, rinse and pat dry prior to dressing wounds Prim Dressing: Promogran Matrix 4.34 (in) 1 x Per Week/30 Days ary Discharge Instructions: Moisten w/normal saline or sterile water; Cover wound as directed. Do not remove from wound bed. Secondary Dressing: Zetuvit Plus 4x8 (in/in) 1 x Per Week/30 Days Com pression Wrap: Urgo K2 Lite, two layer compression system, regular 1 x Per Week/30 Days 1. I am good recommend based on what we are seeing that we have the patient going continue to utilize the wound care measures as before with the collagen I think this is an appropriate way to go. 2. I am would recommend that we continue with the Zetuvit to cover followed by the Urgo K2 lite  compression wrap. 3. He will continue with the juxta fit compression wrap on the right leg. We will see patient back for reevaluation in 1 week here in the clinic. If anything worsens or changes patient will contact our office for additional recommendations. Electronic Signature(s) Signed: 04/13/2023 1:37:11 PM By: Joe Derry PA-C Entered By: Joe Barry on 04/13/2023 13:37:10 -------------------------------------------------------------------------------- SuperBill Details Patient Name: Date of Service: Joe Barry [place] R. 04/13/2023 Medical Record Number: [id number] Patient Account Number: [id number] Date of Birth/Sex: Treating RN: 03-31-1934 (89 y.o. Joe Barry Primary Care Provider: Sharlot Barry Other Clinician: Referring Provider: Treating Provider/Extender: Joe Barry in Treatment: [address] Joe Barry, Joe Barry ([id number]) [id number] Page 8 of 8 Diagnosis Coding ICD-10 Codes Code Description [phone] Laceration without foreign body, left lower leg, initial encounter [phone] Non-pressure chronic ulcer of other part of left lower leg with fat layer exposed I89.0 Lymphedema, not elsewhere classified E11.622 Type 2 diabetes mellitus with other skin ulcer Z79.84 Long term (current) use of oral hypoglycemic drugs I10 Essential (primary) hypertension I73.89 Other specified peripheral vascular diseases Facility Procedures : CPT4 Code: [id number] Description: 11042 - DEB SUBQ TISSUE 20 SQ CM/< ICD-10 Diagnosis Description L97.822 Non-pressure chronic ulcer of other part of left lower leg with fat layer expo Modifier: sed Quantity: 1 Physician Procedures : CPT4 Code Description Modifier [id number] 11042 - WC PHYS SUBQ TISS 20 SQ CM ICD-10 Diagnosis Description L97.822 Non-pressure chronic ulcer of other part of left lower leg with fat layer exposed Quantity: 1 Electronic Signature(s) Signed: 04/13/2023 1:37:28 PM By: Joe Derry PA-C Entered  By: Joe Barry on 04/13/2023 13:37:27

## 2023-04-20 ENCOUNTER — Encounter: Payer: Medicare PPO | Attending: Physician Assistant | Admitting: Physician Assistant

## 2023-04-20 DIAGNOSIS — S81812A Laceration without foreign body, left lower leg, initial encounter: Secondary | ICD-10-CM | POA: Insufficient documentation

## 2023-04-20 DIAGNOSIS — X58XXXA Exposure to other specified factors, initial encounter: Secondary | ICD-10-CM | POA: Diagnosis not present

## 2023-04-20 DIAGNOSIS — Z7984 Long term (current) use of oral hypoglycemic drugs: Secondary | ICD-10-CM | POA: Insufficient documentation

## 2023-04-20 DIAGNOSIS — E1151 Type 2 diabetes mellitus with diabetic peripheral angiopathy without gangrene: Secondary | ICD-10-CM | POA: Diagnosis not present

## 2023-04-20 DIAGNOSIS — I10 Essential (primary) hypertension: Secondary | ICD-10-CM | POA: Diagnosis not present

## 2023-04-20 DIAGNOSIS — E11621 Type 2 diabetes mellitus with foot ulcer: Secondary | ICD-10-CM | POA: Insufficient documentation

## 2023-04-20 DIAGNOSIS — L97822 Non-pressure chronic ulcer of other part of left lower leg with fat layer exposed: Secondary | ICD-10-CM | POA: Diagnosis not present

## 2023-04-20 DIAGNOSIS — I890 Lymphedema, not elsewhere classified: Secondary | ICD-10-CM | POA: Insufficient documentation

## 2023-04-20 DIAGNOSIS — E11622 Type 2 diabetes mellitus with other skin ulcer: Secondary | ICD-10-CM | POA: Insufficient documentation

## 2023-04-20 DIAGNOSIS — S81802A Unspecified open wound, left lower leg, initial encounter: Secondary | ICD-10-CM | POA: Diagnosis not present

## 2023-04-20 NOTE — Progress Notes (Signed)
STEPFON, MEYEROWITZ ([id number]) [id number] Page 1 of 7 Visit Report for 04/20/2023 Chief Complaint Document Details Patient Name: Date of Service: Joe Barry [facility] R. 04/20/2023 10:00 A M Medical Record Number: [id number] Patient Account Number: [id number] Date of Birth/Sex: Treating RN: 11-Oct-1933 (89 y.o. Joe Barry Primary Care Provider: [name] Other Clinician: Betha Loa Referring Provider: Treating Provider/Extender: Kallie Edward in Treatment: 7 Information Obtained from: Patient Chief Complaint Left LE Ulcers Electronic Signature(s) Signed: 04/20/2023 10:08:24 AM By: [name] PA-C Entered By: [name] on 04/20/2023 10:08:24 -------------------------------------------------------------------------------- HPI Details Patient Name: Date of Service: Joe Barry [name] 04/20/2023 10:00 A M Medical Record Number: [id number] Patient Account Number: [id number] Date of Birth/Sex: Treating RN: [birth year] (89 y.o. Joe Barry Primary Care Provider: [name] Other Clinician: Betha Loa Referring Provider: Treating Provider/Extender: Kallie Edward in Treatment: 7 History of Present Illness HPI Description: 02-26-2023 upon evaluation today patient presents for initial inspection here in our clinic concerning issues that he has been having with a wound on his left anterior lower leg. This actually began on 01-14-2023 when the patient was getting out of the car and subsequently they were turning the wheelchair around to move around the door and got too close to the edge of the door and the corner at the bottom actually cut his leg. This because of his lymphedema has been a very hard wound to heal following. Fortunately there does not appear to be any signs of infection systemically which is good news. With that being said even locally I do not see any signs of infection at this point. I do believe that he is  making fairly good progress all things considered although I think that we definitely need to clean some this out and get it moving in the right direction. Patient has been using Medihoney or rather his daughter has at home she is a Engineer, civil (consulting) she works in the ICU office seen her previously as well as a patient. Subsequently after it was apparent that this was not getting better they did go on 7 June to [place] [facility] and at that point they actually put the patient on Augmentin which she started on 02-19-2023 and is still continuing that through current. His most recent hemoglobin A1c was 6.9 on 01-16-2023. He did have an x-ray done of the leg but this has not been released yet it still pending a reading. Currently could not tolerate the ABI screening but he seems to have good perfusion in regard to his leg I do not know that we go to gotten a lot of a good reading anyway when it comes to the ABIs even if we were able to do the screening due to the amount of lymphedema that he has with associated swelling. Patient does have a history of lymphedema, diabetes mellitus type 2, he is on oral hypoglycemic agents. He also has a history of hypertension and peripheral vascular disease although his last ABI check in 2018 was normal according to his daughter and he has seen vascular again at that time but has not had any recent ABIs performed. BRANSON, HANNULA ([id number]) [id number] Page 2 of 7 03-09-2023 upon evaluation today patient appears to be doing well currently in regard to his wounds. He has been tolerating the dressing changes without complication. Fortunately I see signs of definite improvement and very pleased in this regard. 03-16-2023 upon evaluation today patient appears to be doing well  currently in regard to his wound which is actually showing signs of improvement. Fortunately I do not see any evidence of active infection locally nor systemically at this time. 03-23-2023  upon evaluation today patient appears to be doing well currently in regard to his wound. He has been tolerating the dressing changes without complication. The wound actually showed signs of excellent improvement which is great news and very pleased in that regard. 03-30-2023 upon evaluation today patient appears to be doing well currently in regard to his wound which is actually showing signs of significant improvement. Fortunately there does not appear to be any signs of active infection locally or systemically which is great news. No fevers, chills, nausea, vomiting, or diarrhea. 7/23; this was a car door injury in the anterior mid tibia. We have been making excellent progress using collagen under Urgo K2 light compression.He has chronic edema probably lymphedema in the left leg 04-13-2023 upon evaluation today patient appears to be doing well currently in regard to his wound. He has been tolerating the dressing changes without complication. Fortunately I do not see any evidence of infection locally or systemically which is great news. No fevers, chills, nausea, vomiting, or diarrhea. 04-20-2023 upon evaluation today patient appears to be doing well currently in regard to his wound. This actually showing signs of excellent improvement. And 5 mm lidocaine after performing obstructive regimen today which is great news. Fortunately there is no signs of infection. Electronic Signature(s) Signed: 04/20/2023 10:24:25 AM By: [name] PA-C Entered By: [name] on 04/20/2023 10:24:25 -------------------------------------------------------------------------------- Physical Exam Details Patient Name: Date of Service: Joe Barry [name] 04/20/2023 10:00 A M Medical Record Number: [id number] Patient Account Number: [id number] Date of Birth/Sex: Treating RN: 13-Jan-1934 (89 y.o. Joe Barry Primary Care Provider: [name] Other Clinician: Betha Loa Referring Provider: Treating Provider/Extender:  Kallie Edward in Treatment: 7 Constitutional Well-nourished and well-hydrated in no acute distress. Respiratory normal breathing without difficulty. Psychiatric this patient is able to make decisions and demonstrates good insight into disease process. Alert and Oriented x 3. pleasant and cooperative. Notes Upon inspection patient's wound bed actually showed signs of good granulation epithelization at this point. Fortunately I do not see any signs of worsening or infection I do believe the patient is making excellent progress towards complete healing and overall I think that we are on the right track here. No sharp debridement was even necessary today which is great news. Electronic Signature(s) Signed: 04/20/2023 10:24:44 AM By: [name] PA-C Entered By: [name] on 04/20/2023 10:24:44 IBROHIM, BUCCIERI ([id number]) [id number] Page 3 of 7 -------------------------------------------------------------------------------- Physician Orders Details Patient Name: Date of Service: Joe Barry [facility] R. 04/20/2023 10:00 A M Medical Record Number: [id number] Patient Account Number: [id number] Date of Birth/Sex: Treating RN: August 15, 1934 (89 y.o. Joe Barry Primary Care Provider: [name] Other Clinician: Betha Loa Referring Provider: Treating Provider/Extender: Kallie Edward in Treatment: 7 Verbal / Phone Orders: Yes Clinician: Midge Aver Read Back and Verified: Yes Diagnosis Coding ICD-10 Coding Code Description [phone] Laceration without foreign body, left lower leg, initial encounter L97.822 Non-pressure chronic ulcer of other part of left lower leg with fat layer exposed I89.0 Lymphedema, not elsewhere classified E11.622 Type 2 diabetes mellitus with other skin ulcer Z79.84 Long term (current) use of oral hypoglycemic drugs I10 Essential (primary) hypertension I73.89 Other specified peripheral vascular  diseases Follow-up Appointments Return Appointment in 1 week. Nurse Visit as needed Arboriculturist  May shower with wound dressing protected with water repellent cover or cast protector. - pt advised to not get wrap wet No tub bath. Anesthetic (Use 'Patient Medications' Section for Anesthetic Order Entry) Lidocaine applied to wound bed Edema Control - Lymphedema / Segmental Compressive Device / Other UrgoK2 LITE - Juxta Fit right leg Elevate, Exercise Daily and A void Standing for Long Periods of Time. Elevate leg(s) parallel to the floor when sitting. DO YOUR BEST to sleep in the bed at night. DO NOT sleep in your recliner. Long hours of sitting in a recliner leads to swelling of the legs and/or potential wounds on your backside. Other: - juxtalite wrap on RLL Non-Wound Condition Right Lower Extremity pply appropriate compression. - urgo lite large used A dditional non-wound orders/instructions: - AandD ointment to bilat legs prior to wrapping A Wound Treatment Wound #1 - Lower Leg Wound Laterality: Left, Midline, Proximal Cleanser: Soap and Water 1 x Per Week/30 Days Discharge Instructions: Gently cleanse wound with antibacterial soap, rinse and pat dry prior to dressing wounds Prim Dressing: Promogran Matrix 4.34 (in) 1 x Per Week/30 Days ary Discharge Instructions: Moisten w/normal saline or sterile water; Cover wound as directed. Do not remove from wound bed. Secondary Dressing: Zetuvit Plus 4x8 (in/in) 1 x Per Week/30 Days Secondary Dressing: oil emersion dressing 1 x Per Week/30 Days Discharge Instructions: place over collagen Compression Wrap: Urgo K2 Lite, two layer compression system, regular 1 x Per [address] CAMBRYN, LEAZENBY ([id number]) [id number] Page 4 of 7 Electronic Signature(s) Unsigned Entered By: Betha Loa on 04/20/2023  10:25:11 -------------------------------------------------------------------------------- Problem List Details Patient Name: Date of Service: Joe Barry [facility] R. 04/20/2023 10:00 A M Medical Record Number: [id number] Patient Account Number: [id number] Date of Birth/Sex: Treating RN: 11-Dec-1933 (89 y.o. Joe Barry Primary Care Provider: [name] Other Clinician: Betha Loa Referring Provider: Treating Provider/Extender: Kallie Edward in Treatment: 7 Active Problems ICD-10 Encounter Code Description Active Date MDM Diagnosis [phone] Laceration without foreign body, left lower leg, initial encounter 02/26/2023 No Yes L97.822 Non-pressure chronic ulcer of other part of left lower leg with fat layer exposed6/14/2024 No Yes I89.0 Lymphedema, not elsewhere classified 02/26/2023 No Yes E11.622 Type 2 diabetes mellitus with other skin ulcer 02/26/2023 No Yes Z79.84 Long term (current) use of oral hypoglycemic drugs 02/26/2023 No Yes I10 Essential (primary) hypertension 02/26/2023 No Yes I73.89 Other specified peripheral vascular diseases 02/26/2023 No Yes Inactive Problems Resolved Problems Electronic Signature(s) Signed: 04/20/2023 10:06:18 AM By: [name] PA-C Entered By: [name] on 04/20/2023 10:06:18 [name] ([id number]) [id number] Page 5 of 7 -------------------------------------------------------------------------------- Progress Note Details Patient Name: Date of Service: Joe Barry [facility] R. 04/20/2023 10:00 A M Medical Record Number: [id number] Patient Account Number: [id number] Date of Birth/Sex: Treating RN: [birth year] (89 y.o. Joe Barry Primary Care Provider: [name] Other Clinician: Betha Loa Referring Provider: Treating Provider/Extender: Kallie Edward in Treatment: 7 Subjective Chief Complaint Information obtained from Patient Left LE Ulcers History of Present Illness  (HPI) 02-26-2023 upon evaluation today patient presents for initial inspection here in our clinic concerning issues that he has been having with a wound on his left anterior lower leg. This actually began on 01-14-2023 when the patient was getting out of the car and subsequently they were turning the wheelchair around to move around the door and got too close to the edge of the door and the corner at the bottom actually cut his leg.  This because of his lymphedema has been a very hard wound to heal following. Fortunately there does not appear to be any signs of infection systemically which is good news. With that being said even locally I do not see any signs of infection at this point. I do believe that he is making fairly good progress all things considered although I think that we definitely need to clean some this out and get it moving in the right direction. Patient has been using Medihoney or rather his daughter has at home she is a Engineer, civil (consulting) she works in the ICU office seen her previously as well as a patient. Subsequently after it was apparent that this was not getting better they did go on 7 June to [place] [facility] and at that point they actually put the patient on Augmentin which she started on 02-19-2023 and is still continuing that through current. His most recent hemoglobin A1c was 6.9 on 01-16-2023. He did have an x-ray done of the leg but this has not been released yet it still pending a reading. Currently could not tolerate the ABI screening but he seems to have good perfusion in regard to his leg I do not know that we go to gotten a lot of a good reading anyway when it comes to the ABIs even if we were able to do the screening due to the amount of lymphedema that he has with associated swelling. Patient does have a history of lymphedema, diabetes mellitus type 2, he is on oral hypoglycemic agents. He also has a history of hypertension and peripheral vascular disease although his last ABI  check in 2018 was normal according to his daughter and he has seen vascular again at that time but has not had any recent ABIs performed. 03-09-2023 upon evaluation today patient appears to be doing well currently in regard to his wounds. He has been tolerating the dressing changes without complication. Fortunately I see signs of definite improvement and very pleased in this regard. 03-16-2023 upon evaluation today patient appears to be doing well currently in regard to his wound which is actually showing signs of improvement. Fortunately I do not see any evidence of active infection locally nor systemically at this time. 03-23-2023 upon evaluation today patient appears to be doing well currently in regard to his wound. He has been tolerating the dressing changes without complication. The wound actually showed signs of excellent improvement which is great news and very pleased in that regard. 03-30-2023 upon evaluation today patient appears to be doing well currently in regard to his wound which is actually showing signs of significant improvement. Fortunately there does not appear to be any signs of active infection locally or systemically which is great news. No fevers, chills, nausea, vomiting, or diarrhea. 7/23; this was a car door injury in the anterior mid tibia. We have been making excellent progress using collagen under Urgo K2 light compression.He has chronic edema probably lymphedema in the left leg 04-13-2023 upon evaluation today patient appears to be doing well currently in regard to his wound. He has been tolerating the dressing changes without complication. Fortunately I do not see any evidence of infection locally or systemically which is great news. No fevers, chills, nausea, vomiting, or diarrhea. 04-20-2023 upon evaluation today patient appears to be doing well currently in regard to his wound. This actually showing signs of excellent improvement. And 5 mm lidocaine after performing  obstructive regimen today which is great news. Fortunately there is no signs of  infection. Objective Constitutional Well-nourished and well-hydrated in no acute distress. Vitals Time Taken: 10:01 AM, Height: 65 in, Weight: 180 lbs, BMI: 30, Temperature: 99.0 F, Pulse: 81 bpm, Respiratory Rate: 18 breaths/min, Blood Pressure: 163/79 mmHg. MAYSON, LONIE ([id number]) [id number] Page 6 of 7 Respiratory normal breathing without difficulty. Psychiatric this patient is able to make decisions and demonstrates good insight into disease process. Alert and Oriented x 3. pleasant and cooperative. General Notes: Upon inspection patient's wound bed actually showed signs of good granulation epithelization at this point. Fortunately I do not see any signs of worsening or infection I do believe the patient is making excellent progress towards complete healing and overall I think that we are on the right track here. No sharp debridement was even necessary today which is great news. Integumentary (Hair, Skin) Wound #1 status is Open. Original cause of wound was Trauma. The date acquired was: 01/14/2023. The wound has been in treatment 7 weeks. The wound is located on the Left,Proximal,Midline Lower Leg. The wound measures 2.3cm length x 0.5cm width x 0.2cm depth; 0.903cm^2 area and 0.181cm^3 volume. There is Fat Layer (Subcutaneous Tissue) exposed. There is a medium amount of serosanguineous drainage noted. There is small (1-33%) red granulation within the wound bed. There is a large (67-100%) amount of necrotic tissue within the wound bed including Adherent Slough. Assessment Active Problems ICD-10 Laceration without foreign body, left lower leg, initial encounter Non-pressure chronic ulcer of other part of left lower leg with fat layer exposed Lymphedema, not elsewhere classified Type 2 diabetes mellitus with other skin ulcer Long term (current) use of oral hypoglycemic  drugs Essential (primary) hypertension Other specified peripheral vascular diseases Procedures Wound #1 Pre-procedure diagnosis of Wound #1 is a Trauma, Other located on the Left,Proximal,Midline Lower Leg . There was a Double Layer Compression Therapy Procedure by Betha Loa. Post procedure Diagnosis Wound #1: Same as Pre-Procedure Plan Follow-up Appointments: Return Appointment in 1 week. Nurse Visit as needed Bathing/ Shower/ Hygiene: May shower with wound dressing protected with water repellent cover or cast protector. - pt advised to not get wrap wet No tub bath. Anesthetic (Use 'Patient Medications' Section for Anesthetic Order Entry): Lidocaine applied to wound bed Edema Control - Lymphedema / Segmental Compressive Device / Other: UrgoK2 LITE - Juxta Fit right leg Elevate, Exercise Daily and Avoid Standing for Long Periods of Time. Elevate leg(s) parallel to the floor when sitting. DO YOUR BEST to sleep in the bed at night. DO NOT sleep in your recliner. Long hours of sitting in a recliner leads to swelling of the legs and/or potential wounds on your backside. Other: - juxtalite wrap on RLL Non-Wound Condition: Apply appropriate compression. - urgo lite large used Additional non-wound orders/instructions: - AandD ointment to bilat legs prior to wrapping WOUND #1: - Lower Leg Wound Laterality: Left, Midline, Proximal Cleanser: Soap and Water 1 x Per Week/30 Days Discharge Instructions: Gently cleanse wound with antibacterial soap, rinse and pat dry prior to dressing wounds Prim Dressing: Promogran Matrix 4.34 (in) 1 x Per Week/30 Days ary Discharge Instructions: Moisten w/normal saline or sterile water; Cover wound as directed. Do not remove from wound bed. Secondary Dressing: Zetuvit Plus 4x8 (in/in) 1 x Per Week/30 Days Com pression Wrap: Urgo K2 Lite, two layer compression system, regular 1 x Per Week/30 Days 1. I am going to recommend that we have the patient  continue to monitor for any signs of infection or worsening. I do believe that we can go ahead  forward with the collagen dressing which I think is doing a really good job. 2. I am going to recommend the patient should continue with the Zetuvit to cover although we Argun to use a little bit of an oil emulsion dressing in order to cover over this to try to keep the collagen from drying out. 3. Muscle going to recommend the patient should continue with Urgo K2 lite compression wrap and left leg he is using the juxta fit on the right leg which is doing great. VIVIANA, BILODEAU ([id number]) [id number] Page 7 of 7 We will see patient back for reevaluation in 1 week here in the clinic. If anything worsens or changes patient will contact our office for additional recommendations. Electronic Signature(s) Signed: 04/20/2023 10:25:32 AM By: [name] PA-C Entered By: [name] on 04/20/2023 10:25:31 -------------------------------------------------------------------------------- SuperBill Details Patient Name: Date of Service: Joe Barry [place] R. 04/20/2023 Medical Record Number: [id number] Patient Account Number: [id number] Date of Birth/Sex: Treating RN: 02-Mar-1934 (89 y.o. Joe Barry Primary Care Provider: [name] Other Clinician: Betha Loa Referring Provider: Treating Provider/Extender: Kallie Edward in Treatment: 7 Diagnosis Coding ICD-10 Codes Code Description [phone] Laceration without foreign body, left lower leg, initial encounter L97.822 Non-pressure chronic ulcer of other part of left lower leg with fat layer exposed I89.0 Lymphedema, not elsewhere classified E11.622 Type 2 diabetes mellitus with other skin ulcer Z79.84 Long term (current) use of oral hypoglycemic drugs I10 Essential (primary) hypertension I73.89 Other specified peripheral vascular diseases Facility Procedures : CPT4 Code: [id number] Description: (Facility  Use Only) [phone] - APPLY MULTLAY COMPRS LWR LT LEG Modifier: Quantity: 1 Physician Procedures : CPT4 Code Description Modifier [id number] - WC PHYS LEVEL 3 - EST PT ICD-10 Diagnosis Description S81.812A Laceration without foreign body, left lower leg, initial encounter L97.822 Non-pressure chronic ulcer of other part of left lower leg with fat  layer exposed I89.0 Lymphedema, not elsewhere classified E11.622 Type 2 diabetes mellitus with other skin ulcer Quantity: 1 Electronic Signature(s) Signed: 04/20/2023 10:25:47 AM By: [name] PA-C Entered By: [name] on 04/20/2023 10:25:47

## 2023-04-21 NOTE — Progress Notes (Signed)
NIVAAN, FUHR ([id number]) [id number] Page 1 of 9 Visit Report for 04/20/2023 Arrival Information Details Patient Name: Date of Service: Joe Barry [facility] R. 04/20/2023 10:00 A M Medical Record Number: [id number] Patient Account Number: [id number] Date of Birth/Sex: Treating RN: 1934-08-24 (89 y.o. Joe Barry Primary Care Ayrabella Labombard: [name] Other Clinician: Betha Loa Referring Trevonne Nyland: Treating Addis Bennie/Extender: Kallie Edward in Treatment: 7 Visit Information History Since Last Visit All ordered tests and consults were completed: No Patient Arrived: Wheel Chair Added or deleted any medications: No Arrival Time: 09:56 Any new allergies or adverse reactions: No Transfer Assistance: EasyPivot Patient Lift Had a fall or experienced change in No Patient Identification Verified: Yes activities of daily living that may affect Secondary Verification Process Completed: Yes risk of falls: Patient Requires Transmission-Based Precautions: No Signs or symptoms of abuse/neglect since last visito No Patient Has Alerts: No Hospitalized since last visit: No Implantable device outside of the clinic excluding No cellular tissue based products placed in the center since last visit: Has Dressing in Place as Prescribed: Yes Has Compression in Place as Prescribed: Yes Pain Present Now: No Electronic Signature(s) Signed: 04/21/2023 5:47:33 PM By: Betha Loa Entered By: Betha Loa on 04/20/2023 10:00:44 -------------------------------------------------------------------------------- Clinic Level of Care Assessment Details Patient Name: Date of Service: Joe Barry [facility] R. 04/20/2023 10:00 A M Medical Record Number: [id number] Patient Account Number: [id number] Date of Birth/Sex: Treating RN: 09/05/1934 (89 y.o. Joe Barry Primary Care Zamari Vea: [name] Other Clinician: Betha Loa Referring Amareon Phung: Treating  Haiven Nardone/Extender: Kallie Edward in Treatment: 7 Clinic Level of Care Assessment Items TOOL 1 Quantity Score []  - 0 Use when EandM and Procedure is performed on INITIAL visit ASSESSMENTS - Nursing Assessment / Reassessment []  - 0 General Physical Exam (combine w/ comprehensive assessment (listed just below) when performed on new pt. [address]GREG, NORENA ([id number]) [id number] Page 2 of 9 []  - 0 Comprehensive Assessment (HX, ROS, Risk Assessments, Wounds Hx, etc.) ASSESSMENTS - Wound and Skin Assessment / Reassessment []  - 0 Dermatologic / Skin Assessment (not related to wound area) ASSESSMENTS - Ostomy and/or Continence Assessment and Care []  - 0 Incontinence Assessment and Management []  - 0 Ostomy Care Assessment and Management (repouching, etc.) PROCESS - Coordination of Care []  - 0 Simple Patient / Family Education for ongoing care []  - 0 Complex (extensive) Patient / Family Education for ongoing care []  - 0 Staff obtains Chiropractor, Records, T Results / Process Orders est []  - 0 Staff telephones HHA, Nursing Homes / Clarify orders / etc []  - 0 Routine Transfer to another Facility (non-emergent condition) []  - 0 Routine Hospital Admission (non-emergent condition) []  - 0 New Admissions / Manufacturing engineer / Ordering NPWT Apligraf, etc. , []  - 0 Emergency Hospital Admission (emergent condition) PROCESS - Special Needs []  - 0 Pediatric / Minor Patient Management []  - 0 Isolation Patient Management []  - 0 Hearing / Language / Visual special needs []  - 0 Assessment of Community assistance (transportation, D/C planning, etc.) []  - 0 Additional assistance / Altered mentation []  - 0 Support Surface(s) Assessment (bed, cushion, seat, etc.) INTERVENTIONS - Miscellaneous []  - 0 External ear exam []  - 0 Patient Transfer (multiple staff / Nurse, adult / Similar devices) []  - 0 Simple Staple / Suture removal (25 or  less) []  - 0 Complex Staple / Suture removal (26 or more) []  - 0 Hypo/Hyperglycemic Management (do not check if billed separately) []  - 0  Ankle / Brachial Index (ABI) - do not check if billed separately Has the patient been seen at the hospital within the last three years: Yes Total Score: 0 Level Of Care: ____ Electronic Signature(s) Signed: 04/21/2023 5:47:33 PM By: Betha Loa Entered By: Betha Loa on 04/20/2023 10:25:17 -------------------------------------------------------------------------------- Compression Therapy Details Patient Name: Date of Service: Joe Barry [name] 04/20/2023 10:00 A M Medical Record Number: [id number] Patient Account Number: [id number] Date of Birth/Sex: Treating RN: [birth year] (89 y.o. Joe Barry Primary Care Marice Angelino: [name] Other Clinician: Greg, Norena ([id number]) [id number] Page 3 of 9 Referring Rickey Farrier: Treating Maisen Klingler/Extender: Kallie Edward in Treatment: 7 Compression Therapy Performed for Wound Assessment: Wound #1 Left,Proximal,Midline Lower Leg Performed By: Farrel Gordon, Angie, Compression Type: Double Layer Post Procedure Diagnosis Same as Pre-procedure Electronic Signature(s) Signed: 04/21/2023 5:47:33 PM By: Betha Loa Entered By: Betha Loa on 04/20/2023 10:23:22 -------------------------------------------------------------------------------- Lower Extremity Assessment Details Patient Name: Date of Service: Joe Barry [name] 04/20/2023 10:00 A M Medical Record Number: [id number] Patient Account Number: [id number] Date of Birth/Sex: Treating RN: Feb 16, 1934 (89 y.o. Joe Barry Primary Care Eugenie Harewood: [name] Other Clinician: Betha Loa Referring Dnya Hickle: Treating Takoda Janowiak/Extender: Kallie Edward in Treatment: 7 Edema Assessment Assessed: [Left: Yes] [Right: No] Edema: [Left: Ye] [Right: s] Calf Left:  Right: Point of Measurement: 34 cm From Medial Instep 41 cm Ankle Left: Right: Point of Measurement: 12 cm From Medial Instep 28 cm Vascular Assessment Pulses: Dorsalis Pedis Palpable: [Left:Yes] Toe Nail Assessment Left: Right: Thick: Yes Discolored: Yes Deformed: No Improper Length and Hygiene: Yes Electronic Signature(s) Signed: 04/20/2023 4:55:07 PM By: Midge Aver MSN RN CNS WTA Signed: 04/21/2023 5:47:33 PM By: Betha Loa Entered By: Betha Loa on 04/20/2023 10:14:34 [name] ([id number]) [id number] Page 4 of 9 -------------------------------------------------------------------------------- Multi Wound Chart Details Patient Name: Date of Service: Joe Barry [facility] R. 04/20/2023 10:00 A M Medical Record Number: [id number] Patient Account Number: [id number] Date of Birth/Sex: Treating RN: Dec 11, 1933 (89 y.o. Joe Barry Primary Care Nikola Marone: [name] Other Clinician: Betha Loa Referring Jamicia Haaland: Treating Davieon Stockham/Extender: Kallie Edward in Treatment: 7 Vital Signs Height(in): 65 Pulse(bpm): 81 Weight(lbs): 180 Blood Pressure(mmHg): 163/79 Body Mass Index(BMI): 30 Temperature(F): 99.0 Respiratory Rate(breaths/min): 18 [1:Photos:] [N/A:N/A] Left, Proximal, Midline Lower Leg N/A N/A Wound Location: Trauma N/A N/A Wounding Event: Trauma, Other N/A N/A Primary Etiology: Cataracts, Lymphedema, N/A N/A Comorbid History: Hypertension, Peripheral Venous Disease, Type II Diabetes 01/14/2023 N/A N/A Date Acquired: 7 N/A N/A Weeks of Treatment: Open N/A N/A Wound Status: No N/A N/A Wound Recurrence: 2.3x0.5x0.2 N/A N/A Measurements L x W x D (cm) 0.903 N/A N/A A (cm) : rea 0.181 N/A N/A Volume (cm) : 91.60% N/A N/A % Reduction in Area: 94.40% N/A N/A % Reduction in Volume: Full Thickness Without Exposed N/A N/A Classification: Support Structures Medium N/A N/A Exudate  Amount: Serosanguineous N/A N/A Exudate Type: red, brown N/A N/A Exudate Color: Small (1-33%) N/A N/A Granulation Amount: Red N/A N/A Granulation Quality: Large (67-100%) N/A N/A Necrotic Amount: Fat Layer (Subcutaneous Tissue): Yes N/A N/A Exposed Structures: Fascia: No Tendon: No Muscle: No Joint: No Bone: No Small (1-33%) N/A N/A Epithelialization: Treatment Notes Electronic Signature(s) Signed: 04/21/2023 5:47:33 PM By: Betha Loa Entered By: Betha Loa on 04/20/2023 10:14:40 [name] ([id number]) [id number] Page 5 of 9 -------------------------------------------------------------------------------- Multi-Disciplinary Care Plan Details Patient Name: Date of Service: TA [name]  04/20/2023 10:00 A M Medical Record Number: [id number] Patient Account Number: [id number] Date of Birth/Sex: Treating RN: September 05, 1934 (89 y.o. Joe Barry Primary Care Norvell Caswell: [name] Other Clinician: Betha Loa Referring Jamariya Davidoff: Treating Cowan Pilar/Extender: Kallie Edward in Treatment: 7 Active Inactive Venous Leg Ulcer Nursing Diagnoses: Knowledge deficit related to disease process and management Potential for venous Insuffiency (use before diagnosis confirmed) Goals: Patient will maintain optimal edema control Date Initiated: 02/26/2023 Target Resolution Date: 04/23/2023 Goal Status: Active Patient/caregiver will verbalize understanding of disease process and disease management Date Initiated: 02/26/2023 Date Inactivated: 02/26/2023 Target Resolution Date: 02/26/2023 Goal Status: Unmet Unmet Reason: comorbidities Interventions: Assess peripheral edema status every visit. Compression as ordered Treatment Activities: Therapeutic compression applied : 02/26/2023 Notes: Wound/Skin Impairment Nursing Diagnoses: Impaired tissue integrity Knowledge deficit related to ulceration/compromised skin  integrity Goals: Ulcer/skin breakdown will have a volume reduction of 30% by week 4 Date Initiated: 02/26/2023 Date Inactivated: 03/23/2023 Target Resolution Date: 03/26/2023 Goal Status: Met Ulcer/skin breakdown will have a volume reduction of 50% by week 8 Date Initiated: 02/26/2023 Target Resolution Date: 04/23/2023 Goal Status: Active Ulcer/skin breakdown will have a volume reduction of 80% by week 12 Date Initiated: 02/26/2023 Target Resolution Date: 05/21/2023 Goal Status: Active Ulcer/skin breakdown will heal within 14 weeks Date Initiated: 02/26/2023 Target Resolution Date: 06/04/2023 Goal Status: Active Interventions: Assess patient/caregiver ability to obtain necessary supplies Assess patient/caregiver ability to perform ulcer/skin care regimen upon admission and as needed Assess ulceration(s) every visit Provide education on ulcer and skin care Treatment Activities: BRANDO, LICARI ([id number]) [id number] Page 6 of 9 Skin care regimen initiated : 02/26/2023 Notes: Electronic Signature(s) Signed: 04/20/2023 4:55:07 PM By: Midge Aver MSN RN CNS WTA Signed: 04/21/2023 5:47:33 PM By: Betha Loa Entered By: Betha Loa on 04/20/2023 10:25:37 -------------------------------------------------------------------------------- Pain Assessment Details Patient Name: Date of Service: Joe Barry [name] 04/20/2023 10:00 A M Medical Record Number: [id number] Patient Account Number: [id number] Date of Birth/Sex: Treating RN: 09/04/1934 (89 y.o. Joe Barry Primary Care Calil Amor: [name] Other Clinician: Betha Loa Referring Daja Shuping: Treating Mishawn Didion/Extender: Kallie Edward in Treatment: 7 Active Problems Location of Pain Severity and Description of Pain Patient Has Paino No Site Locations Pain Management and Medication Current Pain Management: Electronic Signature(s) Signed: 04/20/2023 4:55:07 PM By: Midge Aver MSN RN CNS  WTA Signed: 04/21/2023 5:47:33 PM By: Betha Loa Entered By: Betha Loa on 04/20/2023 10:03:05 [name] ([id number]) [id number] Page 7 of 9 -------------------------------------------------------------------------------- Patient/Caregiver Education Details Patient Name: Date of Service: TA [name] [place] 8/6/2024andnbsp10:00 A M Medical Record Number: [id number] Patient Account Number: [id number] Date of Birth/Gender: Treating RN: 03-16-1934 (89 y.o. Joe Barry Primary Care Physician: [name] Other Clinician: Betha Loa Referring Physician: Treating Physician/Extender: Kallie Edward in Treatment: 7 Education Assessment Education Provided To: Patient and Caregiver Education Topics Provided Wound/Skin Impairment: Handouts: Other: continue wound care as directed Methods: Explain/Verbal Responses: State content correctly Electronic Signature(s) Signed: 04/21/2023 5:47:33 PM By: Betha Loa Entered By: Betha Loa on 04/20/2023 10:25:53 -------------------------------------------------------------------------------- Wound Assessment Details Patient Name: Date of Service: Joe Barry [name] 04/20/2023 10:00 A M Medical Record Number: [id number] Patient Account Number: [id number] Date of Birth/Sex: Treating RN: March 17, 1934 (89 y.o. Joe Barry Primary Care Brentley Horrell: [name] Other Clinician: Betha Loa Referring Danish Ruffins: Treating Dana Dorner/Extender: Kallie Edward in Treatment: 7 Wound Status Wound Number: 1 Primary Trauma, Other Etiology: Wound Location: Left, Proximal, Midline Lower  Leg Wound Open Wounding Event: Trauma Status: Date Acquired: 01/14/2023 Comorbid Cataracts, Lymphedema, Hypertension, Peripheral Venous Weeks Of Treatment: 7 History: Disease, Type II Diabetes Clustered Wound: No Photos GRAHAM, WYSONG ([id number]) [id number] Page 8 of  9 Wound Measurements Length: (cm) 2.3 Width: (cm) 0.5 Depth: (cm) 0.2 Area: (cm) 0.903 Volume: (cm) 0.181 % Reduction in Area: 91.6% % Reduction in Volume: 94.4% Epithelialization: Small (1-33%) Wound Description Classification: Full Thickness Without Exposed Support Structures Exudate Amount: Medium Exudate Type: Serosanguineous Exudate Color: red, brown Foul Odor After Cleansing: No Slough/Fibrino Yes Wound Bed Granulation Amount: Small (1-33%) Exposed Structure Granulation Quality: Red Fascia Exposed: No Necrotic Amount: Large (67-100%) Fat Layer (Subcutaneous Tissue) Exposed: Yes Necrotic Quality: Adherent Slough Tendon Exposed: No Muscle Exposed: No Joint Exposed: No Bone Exposed: No Treatment Notes Wound #1 (Lower Leg) Wound Laterality: Left, Midline, Proximal Cleanser Soap and Water Discharge Instruction: Gently cleanse wound with antibacterial soap, rinse and pat dry prior to dressing wounds Peri-Wound Care Topical Primary Dressing Promogran Matrix 4.34 (in) Discharge Instruction: Moisten w/normal saline or sterile water; Cover wound as directed. Do not remove from wound bed. Secondary Dressing Zetuvit Plus 4x8 (in/in) oil emersion dressing Discharge Instruction: place over collagen Secured With Compression Wrap Urgo K2 Lite, two layer compression system, regular Compression Stockings Add-Ons Electronic Signature(s) Signed: 04/20/2023 4:55:07 PM By: Midge Aver MSN RN CNS WTA Signed: 04/21/2023 5:47:33 PM By: Betha Loa Entered By: Betha Loa on 04/20/2023 10:13:10 [name] ([id number]) [id number] Page 9 of 9 -------------------------------------------------------------------------------- Vitals Details Patient Name: Date of Service: Joe Barry [facility] [facility] R. 04/20/2023 10:00 A M Medical Record Number: [id number] Patient Account Number: [id number] Date of Birth/Sex: Treating RN: 12/30/1933 (89 y.o. Joe Barry Primary  Care Morrison Masser: [name] Other Clinician: Betha Loa Referring Truda Staub: Treating Ordell Prichett/Extender: Kallie Edward in Treatment: 7 Vital Signs Time Taken: 10:01 Temperature (F): 99.0 Height (in): 65 Pulse (bpm): 81 Weight (lbs): 180 Respiratory Rate (breaths/min): 18 Body Mass Index (BMI): 30 Blood Pressure (mmHg): 163/79 Reference Range: 80 - 120 mg / dl Electronic Signature(s) Signed: 04/21/2023 5:47:33 PM By: Betha Loa Entered By: Betha Loa on 04/20/2023 10:03:02

## 2023-04-27 ENCOUNTER — Encounter: Payer: Medicare PPO | Admitting: Physician Assistant

## 2023-04-27 DIAGNOSIS — I890 Lymphedema, not elsewhere classified: Secondary | ICD-10-CM | POA: Diagnosis not present

## 2023-04-27 DIAGNOSIS — S81812A Laceration without foreign body, left lower leg, initial encounter: Secondary | ICD-10-CM | POA: Diagnosis not present

## 2023-04-27 DIAGNOSIS — L97822 Non-pressure chronic ulcer of other part of left lower leg with fat layer exposed: Secondary | ICD-10-CM | POA: Diagnosis not present

## 2023-04-27 DIAGNOSIS — I10 Essential (primary) hypertension: Secondary | ICD-10-CM | POA: Diagnosis not present

## 2023-04-27 DIAGNOSIS — E11621 Type 2 diabetes mellitus with foot ulcer: Secondary | ICD-10-CM | POA: Diagnosis not present

## 2023-04-27 DIAGNOSIS — E1151 Type 2 diabetes mellitus with diabetic peripheral angiopathy without gangrene: Secondary | ICD-10-CM | POA: Diagnosis not present

## 2023-04-27 DIAGNOSIS — E11622 Type 2 diabetes mellitus with other skin ulcer: Secondary | ICD-10-CM | POA: Diagnosis not present

## 2023-04-27 DIAGNOSIS — Z7984 Long term (current) use of oral hypoglycemic drugs: Secondary | ICD-10-CM | POA: Diagnosis not present

## 2023-04-27 DIAGNOSIS — S81802A Unspecified open wound, left lower leg, initial encounter: Secondary | ICD-10-CM | POA: Diagnosis not present

## 2023-04-27 NOTE — Progress Notes (Signed)
Joe Barry ([id number]) [id number] Page 1 of 7 Visit Report for 04/27/2023 Chief Complaint Document Details Patient Name: Date of Service: Joe Barry [facility] R. 04/27/2023 10:00 A M Medical Record Number: [id number] Patient Account Number: [id number] Date of Birth/Sex: Treating RN: 1933-11-01 (89 y.o. [name] Primary Care Provider: [name] Other Clinician: Betha Loa Referring Provider: Treating Provider/Extender: Kallie Edward in Treatment: 8 Information Obtained from: Patient Chief Complaint Left LE Ulcers Electronic Signature(s) Signed: 04/27/2023 10:03:24 AM By: [name] PA-C Entered By: [name] on 04/27/2023 10:03:24 -------------------------------------------------------------------------------- HPI Details Patient Name: Date of Service: Joe Barry [name] 04/27/2023 10:00 A M Medical Record Number: [id number] Patient Account Number: [id number] Date of Birth/Sex: Treating RN: 1934-04-27 (89 y.o. [name] Primary Care Provider: [name] Other Clinician: Betha Loa Referring Provider: Treating Provider/Extender: Kallie Edward in Treatment: 8 History of Present Illness HPI Description: 02-26-2023 upon evaluation today patient presents for initial inspection here in our clinic concerning issues that he has been having with a wound on his left anterior lower leg. This actually began on 01-14-2023 when the patient was getting out of the car and subsequently they were turning the wheelchair around to move around the door and got too close to the edge of the door and the corner at the bottom actually cut his leg. This because of his lymphedema has been a very hard wound to heal following. Fortunately there does not appear to be any signs of infection systemically which is good news. With that being said even locally I do not see any signs of infection at this point. I do believe that he  is making fairly good progress all things considered although I think that we definitely need to clean some this out and get it moving in the right direction. Patient has been using Medihoney or rather his daughter has at home she is a Engineer, civil (consulting) she works in the ICU office seen her previously as well as a patient. Subsequently after it was apparent that this was not getting better they did go on 7 June to [place] [facility] and at that point they actually put the patient on Augmentin which she started on 02-19-2023 and is still continuing that through current. His most recent hemoglobin A1c was 6.9 on 01-16-2023. He did have an x-ray done of the leg but this has not been released yet it still pending a reading. Currently could not tolerate the ABI screening but he seems to have good perfusion in regard to his leg I do not know that we go to gotten a lot of a good reading anyway when it comes to the ABIs even if we were able to do the screening due to the amount of lymphedema that he has with associated swelling. Patient does have a history of lymphedema, diabetes mellitus type 2, he is on oral hypoglycemic agents. He also has a history of hypertension and peripheral vascular disease although his last ABI check in 2018 was normal according to his daughter and he has seen vascular again at that time but has not had any recent ABIs performed. KHAIR, VIESCA ([id number]) [id number] Page 2 of 7 03-09-2023 upon evaluation today patient appears to be doing well currently in regard to his wounds. He has been tolerating the dressing changes without complication. Fortunately I see signs of definite improvement and very pleased in this regard. 03-16-2023 upon evaluation today patient appears to be doing well  currently in regard to his wound which is actually showing signs of improvement. Fortunately I do not see any evidence of active infection locally nor systemically at this  time. 03-23-2023 upon evaluation today patient appears to be doing well currently in regard to his wound. He has been tolerating the dressing changes without complication. The wound actually showed signs of excellent improvement which is great news and very pleased in that regard. 03-30-2023 upon evaluation today patient appears to be doing well currently in regard to his wound which is actually showing signs of significant improvement. Fortunately there does not appear to be any signs of active infection locally or systemically which is great news. No fevers, chills, nausea, vomiting, or diarrhea. 7/23; this was a car door injury in the anterior mid tibia. We have been making excellent progress using collagen under Urgo K2 light compression.He has chronic edema probably lymphedema in the left leg 04-13-2023 upon evaluation today patient appears to be doing well currently in regard to his wound. He has been tolerating the dressing changes without complication. Fortunately I do not see any evidence of infection locally or systemically which is great news. No fevers, chills, nausea, vomiting, or diarrhea. 04-20-2023 upon evaluation today patient appears to be doing well currently in regard to his wound. This actually showing signs of excellent improvement. And 5 mm lidocaine after performing obstructive regimen today which is great news. Fortunately there is no signs of infection. 04-27-2023 upon evaluation today patient appears to be doing excellent in regard to his leg ulcer. Has been tolerating the dressing changes without complication. Fortunately I do not see any signs of active infection at this time. Electronic Signature(s) Signed: 04/27/2023 11:24:31 AM By: [name] PA-C Entered By: [name] on 04/27/2023 11:24:31 -------------------------------------------------------------------------------- Physical Exam Details Patient Name: Date of Service: Joe Barry [place] R. 04/27/2023 10:00 A M Medical  Record Number: [id number] Patient Account Number: [id number] Date of Birth/Sex: Treating RN: 07-26-1934 (89 y.o. [name] Primary Care Provider: [name] Other Clinician: Betha Loa Referring Provider: Treating Provider/Extender: Kallie Edward in Treatment: 8 Constitutional Well-nourished and well-hydrated in no acute distress. Respiratory normal breathing without difficulty. Psychiatric this patient is able to make decisions and demonstrates good insight into disease process. Alert and Oriented x 3. pleasant and cooperative. Notes Upon inspection patient's wound bed actually showed signs of good granulation epithelization I think he is actually doing quite well the wound is about half the size that it was and I am extremely pleased in that regard he just has the small area distally that is still open. Electronic Signature(s) Signed: 04/27/2023 11:24:46 AM By: [name] PA-C Entered By: [name] on 04/27/2023 11:24:46 [name] ([id number]) [id number] Page 3 of 7 -------------------------------------------------------------------------------- Physician Orders Details Patient Name: Date of Service: Joe Barry [facility] R. 04/27/2023 10:00 A M Medical Record Number: [id number] Patient Account Number: [id number] Date of Birth/Sex: Treating RN: 06/08/1934 (89 y.o. [name] Primary Care Provider: [name] Other Clinician: Betha Loa Referring Provider: Treating Provider/Extender: Kallie Edward in Treatment: 8 Verbal / Phone Orders: Yes Clinician: Midge Aver Read Back and Verified: Yes Diagnosis Coding ICD-10 Coding Code Description [phone] Laceration without foreign body, left lower leg, initial encounter L97.822 Non-pressure chronic ulcer of other part of left lower leg with fat layer exposed I89.0 Lymphedema, not elsewhere classified E11.622 Type 2 diabetes mellitus with other  skin ulcer Z79.84 Long term (current) use of oral hypoglycemic  drugs I10 Essential (primary) hypertension I73.89 Other specified peripheral vascular diseases Follow-up Appointments Return Appointment in 1 week. Nurse Visit as needed Bathing/ Shower/ Hygiene May shower with wound dressing protected with water repellent cover or cast protector. - pt advised to not get wrap wet No tub bath. Anesthetic (Use 'Patient Medications' Section for Anesthetic Order Entry) Lidocaine applied to wound bed Edema Control - Lymphedema / Segmental Compressive Device / Other UrgoK2 LITE - Juxta Fit right leg Elevate, Exercise Daily and A void Standing for Long Periods of Time. Elevate leg(s) parallel to the floor when sitting. DO YOUR BEST to sleep in the bed at night. DO NOT sleep in your recliner. Long hours of sitting in a recliner leads to swelling of the legs and/or potential wounds on your backside. Other: - juxtalite wrap on RLL Non-Wound Condition Right Lower Extremity pply appropriate compression. - urgo lite large used A dditional non-wound orders/instructions: - AandD ointment to bilat legs prior to wrapping A Wound Treatment Wound #1 - Lower Leg Wound Laterality: Left, Midline, Proximal Cleanser: Soap and Water 1 x Per Week/30 Days Discharge Instructions: Gently cleanse wound with antibacterial soap, rinse and pat dry prior to dressing wounds Prim Dressing: Promogran Matrix 4.34 (in) 1 x Per Week/30 Days ary Discharge Instructions: Moisten w/normal saline or sterile water; Cover wound as directed. Do not remove from wound bed. Secondary Dressing: Zetuvit Plus 4x8 (in/in) 1 x Per Week/30 Days Secondary Dressing: oil emersion dressing 1 x Per Week/30 Days Discharge Instructions: place over collagen Compression Wrap: Urgo K2 Lite, two layer compression system, regular 1 x Per [address] KILIAN, HEWITSON ([id number]) [id number] Page 4 of 7 Electronic  Signature(s) Signed: 04/27/2023 4:30:30 PM By: [name] PA-C Signed: 04/29/2023 8:42:03 AM By: Betha Loa Entered By: Betha Loa on 04/27/2023 11:02:21 -------------------------------------------------------------------------------- Problem List Details Patient Name: Date of Service: Joe Barry [name] 04/27/2023 10:00 A M Medical Record Number: [id number] Patient Account Number: [id number] Date of Birth/Sex: Treating RN: 04/13/1934 (89 y.o. [name] Primary Care Provider: [name] Other Clinician: Betha Loa Referring Provider: Treating Provider/Extender: Kallie Edward in Treatment: 8 Active Problems ICD-10 Encounter Code Description Active Date MDM Diagnosis [phone] Laceration without foreign body, left lower leg, initial encounter 02/26/2023 No Yes L97.822 Non-pressure chronic ulcer of other part of left lower leg with fat layer exposed6/14/2024 No Yes I89.0 Lymphedema, not elsewhere classified 02/26/2023 No Yes E11.622 Type 2 diabetes mellitus with other skin ulcer 02/26/2023 No Yes Z79.84 Long term (current) use of oral hypoglycemic drugs 02/26/2023 No Yes I10 Essential (primary) hypertension 02/26/2023 No Yes I73.89 Other specified peripheral vascular diseases 02/26/2023 No Yes Inactive Problems Resolved Problems Electronic Signature(s) Signed: 04/27/2023 10:03:21 AM By: [name] PA-C Entered By: [name] on 04/27/2023 10:03:21 REICE, SWIRSKY ([id number]) [id number] Page 5 of 7 -------------------------------------------------------------------------------- Progress Note Details Patient Name: Date of Service: Joe Barry [facility] R. 04/27/2023 10:00 A M Medical Record Number: [id number] Patient Account Number: [id number] Date of Birth/Sex: Treating RN: [birth year] (89 y.o. [name] Primary Care Provider: [name] Other Clinician: Betha Loa Referring Provider: Treating Provider/Extender:  Kallie Edward in Treatment: 8 Subjective Chief Complaint Information obtained from Patient Left LE Ulcers History of Present Illness (HPI) 02-26-2023 upon evaluation today patient presents for initial inspection here in our clinic concerning issues that he has been having with a wound on his left anterior lower leg. This actually began on 01-14-2023 when the patient  was getting out of the car and subsequently they were turning the wheelchair around to move around the door and got too close to the edge of the door and the corner at the bottom actually cut his leg. This because of his lymphedema has been a very hard wound to heal following. Fortunately there does not appear to be any signs of infection systemically which is good news. With that being said even locally I do not see any signs of infection at this point. I do believe that he is making fairly good progress all things considered although I think that we definitely need to clean some this out and get it moving in the right direction. Patient has been using Medihoney or rather his daughter has at home she is a Engineer, civil (consulting) she works in the ICU office seen her previously as well as a patient. Subsequently after it was apparent that this was not getting better they did go on 7 June to [place] [facility] and at that point they actually put the patient on Augmentin which she started on 02-19-2023 and is still continuing that through current. His most recent hemoglobin A1c was 6.9 on 01-16-2023. He did have an x-ray done of the leg but this has not been released yet it still pending a reading. Currently could not tolerate the ABI screening but he seems to have good perfusion in regard to his leg I do not know that we go to gotten a lot of a good reading anyway when it comes to the ABIs even if we were able to do the screening due to the amount of lymphedema that he has with associated swelling. Patient does have a history of  lymphedema, diabetes mellitus type 2, he is on oral hypoglycemic agents. He also has a history of hypertension and peripheral vascular disease although his last ABI check in 2018 was normal according to his daughter and he has seen vascular again at that time but has not had any recent ABIs performed. 03-09-2023 upon evaluation today patient appears to be doing well currently in regard to his wounds. He has been tolerating the dressing changes without complication. Fortunately I see signs of definite improvement and very pleased in this regard. 03-16-2023 upon evaluation today patient appears to be doing well currently in regard to his wound which is actually showing signs of improvement. Fortunately I do not see any evidence of active infection locally nor systemically at this time. 03-23-2023 upon evaluation today patient appears to be doing well currently in regard to his wound. He has been tolerating the dressing changes without complication. The wound actually showed signs of excellent improvement which is great news and very pleased in that regard. 03-30-2023 upon evaluation today patient appears to be doing well currently in regard to his wound which is actually showing signs of significant improvement. Fortunately there does not appear to be any signs of active infection locally or systemically which is great news. No fevers, chills, nausea, vomiting, or diarrhea. 7/23; this was a car door injury in the anterior mid tibia. We have been making excellent progress using collagen under Urgo K2 light compression.He has chronic edema probably lymphedema in the left leg 04-13-2023 upon evaluation today patient appears to be doing well currently in regard to his wound. He has been tolerating the dressing changes without complication. Fortunately I do not see any evidence of infection locally or systemically which is great news. No fevers, chills, nausea, vomiting, or diarrhea. 04-20-2023 upon evaluation today  patient appears to be doing well currently in regard to his wound. This actually showing signs of excellent improvement. And 5 mm lidocaine after performing obstructive regimen today which is great news. Fortunately there is no signs of infection. 04-27-2023 upon evaluation today patient appears to be doing excellent in regard to his leg ulcer. Has been tolerating the dressing changes without complication. Fortunately I do not see any signs of active infection at this time. Objective Constitutional Well-nourished and well-hydrated in no acute distress. IZRAEL, DRAGOS ([id number]) [id number] Page 6 of 7 Vitals Time Taken: 10:08 AM, Height: 65 in, Weight: 180 lbs, BMI: 30, Temperature: 97.8 F, Pulse: 83 bpm, Respiratory Rate: 16 breaths/min, Blood Pressure: 171/80 mmHg. Respiratory normal breathing without difficulty. Psychiatric this patient is able to make decisions and demonstrates good insight into disease process. Alert and Oriented x 3. pleasant and cooperative. General Notes: Upon inspection patient's wound bed actually showed signs of good granulation epithelization I think he is actually doing quite well the wound is about half the size that it was and I am extremely pleased in that regard he just has the small area distally that is still open. Integumentary (Hair, Skin) Wound #1 status is Open. Original cause of wound was Trauma. The date acquired was: 01/14/2023. The wound has been in treatment 8 weeks. The wound is located on the Left,Proximal,Midline Lower Leg. The wound measures 1.6cm length x 0.4cm width x 0.2cm depth; 0.503cm^2 area and 0.101cm^3 volume. There is Fat Layer (Subcutaneous Tissue) exposed. There is a medium amount of serosanguineous drainage noted. There is small (1-33%) red granulation within the wound bed. There is a large (67-100%) amount of necrotic tissue within the wound bed including Adherent Slough. Assessment Active  Problems ICD-10 Laceration without foreign body, left lower leg, initial encounter Non-pressure chronic ulcer of other part of left lower leg with fat layer exposed Lymphedema, not elsewhere classified Type 2 diabetes mellitus with other skin ulcer Long term (current) use of oral hypoglycemic drugs Essential (primary) hypertension Other specified peripheral vascular diseases Procedures Wound #1 Pre-procedure diagnosis of Wound #1 is a Trauma, Other located on the Left,Proximal,Midline Lower Leg . There was a Double Layer Compression Therapy Procedure by Betha Loa. Post procedure Diagnosis Wound #1: Same as Pre-Procedure Plan Follow-up Appointments: Return Appointment in 1 week. Nurse Visit as needed Bathing/ Shower/ Hygiene: May shower with wound dressing protected with water repellent cover or cast protector. - pt advised to not get wrap wet No tub bath. Anesthetic (Use 'Patient Medications' Section for Anesthetic Order Entry): Lidocaine applied to wound bed Edema Control - Lymphedema / Segmental Compressive Device / Other: UrgoK2 LITE - Juxta Fit right leg Elevate, Exercise Daily and Avoid Standing for Long Periods of Time. Elevate leg(s) parallel to the floor when sitting. DO YOUR BEST to sleep in the bed at night. DO NOT sleep in your recliner. Long hours of sitting in a recliner leads to swelling of the legs and/or potential wounds on your backside. Other: - juxtalite wrap on RLL Non-Wound Condition: Apply appropriate compression. - urgo lite large used Additional non-wound orders/instructions: - AandD ointment to bilat legs prior to wrapping WOUND #1: - Lower Leg Wound Laterality: Left, Midline, Proximal Cleanser: Soap and Water 1 x Per Week/30 Days Discharge Instructions: Gently cleanse wound with antibacterial soap, rinse and pat dry prior to dressing wounds Prim Dressing: Promogran Matrix 4.34 (in) 1 x Per Week/30 Days ary Discharge Instructions: Moisten  w/normal saline or sterile water; Cover wound as  directed. Do not remove from wound bed. Secondary Dressing: Zetuvit Plus 4x8 (in/in) 1 x Per Week/30 Days Secondary Dressing: oil emersion dressing 1 x Per Week/30 Days Discharge Instructions: place over collagen Com pression Wrap: Urgo K2 Lite, two layer compression system, regular 1 x Per Week/30 Days 1. I would recommend that we have the patient going continue to monitor for any signs of infection or worsening. Based on what I am seeing going to continue with the collagen. KATRINA, KRAUSER ([id number]) [id number] Page 7 of 7 2. I am also can recommend we continue with the Zetuvit to cover. 3. Will be using the oil emulsion over top of the collagen to prevent it from drying out. 4. I am also going to suggest the patient should continue to elevate his legs much as possible. We will see patient back for reevaluation in 1 week here in the clinic. If anything worsens or changes patient will contact our office for additional recommendations. Electronic Signature(s) Signed: 04/27/2023 11:25:16 AM By: [name] PA-C Entered By: [name] on 04/27/2023 11:25:15 -------------------------------------------------------------------------------- SuperBill Details Patient Name: Date of Service: Joe Barry [name] 04/27/2023 Medical Record Number: [id number] Patient Account Number: [id number] Date of Birth/Sex: Treating RN: 12/18/1933 (89 y.o. [name] Primary Care Provider: [name] Other Clinician: Betha Loa Referring Provider: Treating Provider/Extender: Kallie Edward in Treatment: 8 Diagnosis Coding ICD-10 Codes Code Description [phone] Laceration without foreign body, left lower leg, initial encounter L97.822 Non-pressure chronic ulcer of other part of left lower leg with fat layer exposed I89.0 Lymphedema, not elsewhere classified E11.622 Type 2 diabetes mellitus with other skin  ulcer Z79.84 Long term (current) use of oral hypoglycemic drugs I10 Essential (primary) hypertension I73.89 Other specified peripheral vascular diseases Facility Procedures : CPT4 Code: [id number] Description: (Facility Use Only) [phone] - APPLY MULTLAY COMPRS LWR LT LEG Modifier: Quantity: 1 Physician Procedures : CPT4 Code Description Modifier [id number] - WC PHYS LEVEL 3 - EST PT ICD-10 Diagnosis Description S81.812A Laceration without foreign body, left lower leg, initial encounter L97.822 Non-pressure chronic ulcer of other part of left lower leg with fat  layer exposed I89.0 Lymphedema, not elsewhere classified E11.622 Type 2 diabetes mellitus with other skin ulcer Quantity: 1 Electronic Signature(s) Signed: 04/27/2023 11:27:20 AM By: [name] PA-C Entered By: [name] on 04/27/2023 11:27:20

## 2023-04-27 NOTE — Progress Notes (Signed)
Joe Barry ([id number]) [id number] Page 1 of 9 Visit Report for 04/27/2023 Arrival Information Details Patient Name: Date of Service: Joe Barry Joe Barry R. 04/27/2023 10:00 A M Medical Record Number: [id number] Patient Account Number: [id number] Date of Birth/Sex: Treating RN: 1934/01/01 (89 y.o. Joe Barry Primary Care Joe Barry: Joe Barry Other Clinician: Betha Barry Referring Joe Barry: Treating Shayra Anton/Extender: Joe Barry in Treatment: 8 Visit Information History Since Last Visit All ordered tests and consults were completed: No Patient Arrived: Wheel Chair Added or deleted any medications: No Arrival Time: 10:02 Any new allergies or adverse reactions: No Transfer Assistance: EasyPivot Patient Lift Had a fall or experienced change in No Patient Identification Verified: Yes activities of daily living that may affect Secondary Verification Process Completed: Yes risk of falls: Patient Requires Transmission-Based Precautions: No Signs or symptoms of abuse/neglect since last visito No Patient Has Alerts: No Hospitalized since last visit: No Implantable device outside of the clinic excluding No cellular tissue based products placed in the Barry since last visit: Has Dressing in Place as Prescribed: Yes Has Compression in Place as Prescribed: Yes Pain Present Now: No Electronic Signature(s) Signed: 04/29/2023 8:42:03 AM By: Joe Barry Entered By: Joe Barry on 04/27/2023 10:08:11 -------------------------------------------------------------------------------- Clinic Level of Care Assessment Details Patient Name: Date of Service: Joe Barry Joe Barry R. 04/27/2023 10:00 A M Medical Record Number: [id number] Patient Account Number: [id number] Date of Birth/Sex: Treating RN: [birth year] (89 y.o. Joe Barry Primary Care Desa Rech: Joe Barry Other Clinician: Betha Barry Referring Britain Anagnos: Treating  Joe Barry/Extender: Joe Barry in Treatment: 8 Clinic Level of Care Assessment Items TOOL 1 Quantity Score []  - 0 Use when EandM and Procedure is performed on INITIAL visit ASSESSMENTS - Nursing Assessment / Reassessment []  - 0 General Physical Exam (combine w/ comprehensive assessment (listed just below) when performed on new pt. [name], [name] ([id number]) [id number] Page 2 of 9 []  - 0 Comprehensive Assessment (HX, ROS, Risk Assessments, Wounds Hx, etc.) ASSESSMENTS - Wound and Skin Assessment / Reassessment []  - 0 Dermatologic / Skin Assessment (not related to wound area) ASSESSMENTS - Ostomy and/or Continence Assessment and Care []  - 0 Incontinence Assessment and Management []  - 0 Ostomy Care Assessment and Management (repouching, etc.) PROCESS - Coordination of Care []  - 0 Simple Patient / Family Education for ongoing care []  - 0 Complex (extensive) Patient / Family Education for ongoing care []  - 0 Staff obtains Chiropractor, Records, T Results / Process Orders est []  - 0 Staff telephones HHA, Nursing Homes / Clarify orders / etc []  - 0 Routine Transfer to another Facility (non-emergent condition) []  - 0 Routine Hospital Admission (non-emergent condition) []  - 0 New Admissions / Manufacturing engineer / Ordering NPWT Apligraf, etc. , []  - 0 Emergency Hospital Admission (emergent condition) PROCESS - Special Needs []  - 0 Pediatric / Minor Patient Management []  - 0 Isolation Patient Management []  - 0 Hearing / Language / Visual special needs []  - 0 Assessment of Community assistance (transportation, D/C planning, etc.) []  - 0 Additional assistance / Altered mentation []  - 0 Support Surface(s) Assessment (bed, cushion, seat, etc.) INTERVENTIONS - Miscellaneous []  - 0 External ear exam []  - 0 Patient Transfer (multiple staff / Nurse, adult / Similar devices) []  - 0 Simple Staple / Suture removal (25 or  less) []  - 0 Complex Staple / Suture removal (26 or more) []  - 0 Hypo/Hyperglycemic Management (do not check if billed separately) []  - 0  Ankle / Brachial Index (ABI) - do not check if billed separately Has the patient been seen at the hospital within the last three years: Yes Total Score: 0 Level Of Care: ____ Electronic Signature(s) Signed: 04/29/2023 8:42:03 AM By: Joe Barry Entered By: Joe Barry on 04/27/2023 11:02:28 -------------------------------------------------------------------------------- Compression Therapy Details Patient Name: Date of Service: Joe Barry [name] 04/27/2023 10:00 A M Medical Record Number: [id number] Patient Account Number: [id number] Date of Birth/Sex: Treating RN: 1933-09-18 (89 y.o. Joe Barry Primary Care Joe Barry: Joe Barry Other Clinician: Rayder, Barry ([id number]) [id number] Page 3 of 9 Referring Joe Barry: Treating Joe Barry/Extender: Joe Barry in Treatment: 8 Compression Therapy Performed for Wound Assessment: Wound #1 Left,Proximal,Midline Lower Leg Performed By: Joe Barry, Joe Barry, Compression Type: Double Layer Post Procedure Diagnosis Same as Pre-procedure Electronic Signature(s) Signed: 04/29/2023 8:42:03 AM By: Joe Barry Entered By: Joe Barry on 04/27/2023 11:02:06 -------------------------------------------------------------------------------- Encounter Discharge Information Details Patient Name: Date of Service: Joe Barry [name] 04/27/2023 10:00 A M Medical Record Number: [id number] Patient Account Number: [id number] Date of Birth/Sex: Treating RN: September 13, 1934 (89 y.o. Joe Barry Primary Care Shaquia Berkley: Joe Barry Other Clinician: Betha Barry Referring Joe Barry: Treating Joe Barry/Extender: Joe Barry in Treatment: 8 Encounter Discharge Information Items Discharge Condition: Stable Ambulatory Status:  Wheelchair Discharge Destination: Home Transportation: Private Auto Accompanied By: Joe Barry Schedule Follow-up Appointment: Yes Clinical Summary of Care: Electronic Signature(s) Signed: 04/29/2023 8:42:03 AM By: Joe Barry Entered By: Joe Barry on 04/27/2023 11:15:57 -------------------------------------------------------------------------------- Lower Extremity Assessment Details Patient Name: Date of Service: Joe Barry [facility] R. 04/27/2023 10:00 A M Medical Record Number: [id number] Patient Account Number: [id number] Date of Birth/Sex: Treating RN: 1934-08-14 (89 y.o. Joe Barry Primary Care Luismario Coston: Joe Barry Other Clinician: Betha Barry Referring Chelcea Zahn: Treating Arjuna Doeden/Extender: Joe Barry in Treatment: 8 Edema Assessment Left: [Left: Right] [Right: :] Assessed: [Left: Yes] [Right: No] Edema: [Left: Ye] [Right: s] Calf Left: Right: Point of Measurement: 34 cm From Medial Instep 39.5 cm Ankle Left: Right: Point of Measurement: 12 cm From Medial Instep 28.5 cm Vascular Assessment Pulses: Dorsalis Pedis Palpable: [Left:Yes] Electronic Signature(s) Signed: 04/27/2023 4:57:03 PM By: Midge Aver MSN RN CNS WTA Signed: 04/29/2023 8:42:03 AM By: Joe Barry Entered By: Joe Barry on 04/27/2023 10:18:35 -------------------------------------------------------------------------------- Multi Wound Chart Details Patient Name: Date of Service: Joe Barry [name] 04/27/2023 10:00 A M Medical Record Number: [id number] Patient Account Number: [id number] Date of Birth/Sex: Treating RN: [birth year] (89 y.o. Joe Barry Primary Care Shakyia Bosso: Joe Barry Other Clinician: Betha Barry Referring Megan Hayduk: Treating Maahi Lannan/Extender: Joe Barry in Treatment: 8 Vital Signs Height(in): 65 Pulse(bpm): 83 Weight(lbs): 180 Blood Pressure(mmHg): 171/80 Body Mass Index(BMI): 30 Temperature(F):  97.8 Respiratory Rate(breaths/min): 16 [1:Photos:] [N/A:N/A] Left, Proximal, Midline Lower Leg N/A N/A Wound Location: Trauma N/A N/A Wounding Event: Trauma, Other N/A N/A Primary Etiology: Cataracts, Lymphedema, N/A N/A Comorbid History: Hypertension, Peripheral Venous Disease, Type II Diabetes 01/14/2023 N/A N/A Date Acquired: 8 N/A N/A Weeks of Treatment: Open N/A N/A Wound Status: No N/A N/A Wound Recurrence: Joe Barry, Joe Barry ([id number]) [id number] Page 5 of 9 1.6x0.4x0.2 N/A N/A Measurements L x W x D (cm) 0.503 N/A N/A A (cm) : rea 0.101 N/A N/A Volume (cm) : 95.30% N/A N/A % Reduction in Area: 96.90% N/A N/A % Reduction in Volume: Full Thickness Without Exposed N/A N/A Classification: Support Structures Medium N/A N/A Exudate Amount: Serosanguineous N/A N/A  Exudate Type: red, brown N/A N/A Exudate Color: Small (1-33%) N/A N/A Granulation Amount: Red N/A N/A Granulation Quality: Large (67-100%) N/A N/A Necrotic Amount: Fat Layer (Subcutaneous Tissue): Yes N/A N/A Exposed Structures: Fascia: No Tendon: No Muscle: No Joint: No Bone: No Small (1-33%) N/A N/A Epithelialization: Treatment Notes Electronic Signature(s) Signed: 04/29/2023 8:42:03 AM By: Joe Barry Entered By: Joe Barry on 04/27/2023 10:18:45 -------------------------------------------------------------------------------- Multi-Disciplinary Care Plan Details Patient Name: Date of Service: Joe Barry [name] 04/27/2023 10:00 A M Medical Record Number: [id number] Patient Account Number: [id number] Date of Birth/Sex: Treating RN: 1933-10-16 (89 y.o. Joe Barry Primary Care Letta Cargile: Joe Barry Other Clinician: Betha Barry Referring Zaire Levesque: Treating Patrina Andreas/Extender: Joe Barry in Treatment: 8 Active Inactive Venous Leg Ulcer Nursing Diagnoses: Knowledge deficit related to disease process and management Potential for  venous Insuffiency (use before diagnosis confirmed) Goals: Patient will maintain optimal edema control Date Initiated: 02/26/2023 Target Resolution Date: 04/23/2023 Goal Status: Active Patient/caregiver will verbalize understanding of disease process and disease management Date Initiated: 02/26/2023 Date Inactivated: 02/26/2023 Target Resolution Date: 02/26/2023 Goal Status: Unmet Unmet Reason: comorbidities Interventions: Assess peripheral edema status every visit. Compression as ordered Treatment Activities: Therapeutic compression applied : 02/26/2023 Notes: Joe Barry, Joe Barry ([id number]) [phone].pdf Page 6 of 9 Wound/Skin Impairment Nursing Diagnoses: Impaired tissue integrity Knowledge deficit related to ulceration/compromised skin integrity Goals: Ulcer/skin breakdown will have a volume reduction of 30% by week 4 Date Initiated: 02/26/2023 Date Inactivated: 03/23/2023 Target Resolution Date: 03/26/2023 Goal Status: Met Ulcer/skin breakdown will have a volume reduction of 50% by week 8 Date Initiated: 02/26/2023 Target Resolution Date: 04/23/2023 Goal Status: Active Ulcer/skin breakdown will have a volume reduction of 80% by week 12 Date Initiated: 02/26/2023 Target Resolution Date: 05/21/2023 Goal Status: Active Ulcer/skin breakdown will heal within 14 weeks Date Initiated: 02/26/2023 Target Resolution Date: 06/04/2023 Goal Status: Active Interventions: Assess patient/caregiver ability to obtain necessary supplies Assess patient/caregiver ability to perform ulcer/skin care regimen upon admission and as needed Assess ulceration(s) every visit Provide education on ulcer and skin care Treatment Activities: Skin care regimen initiated : 02/26/2023 Notes: Electronic Signature(s) Signed: 04/27/2023 4:57:03 PM By: Midge Aver MSN RN CNS WTA Signed: 04/29/2023 8:42:03 AM By: Joe Barry Entered By: Joe Barry on 04/27/2023  11:14:24 -------------------------------------------------------------------------------- Pain Assessment Details Patient Name: Date of Service: Joe Barry [name] 04/27/2023 10:00 A M Medical Record Number: [id number] Patient Account Number: [id number] Date of Birth/Sex: Treating RN: [birth year] (89 y.o. Joe Barry Primary Care Bevelyn Arriola: Joe Barry Other Clinician: Betha Barry Referring Josue Kass: Treating Karizma Cheek/Extender: Joe Barry in Treatment: 8 Active Problems Location of Pain Severity and Description of Pain Patient Has Paino No Site Locations JUANITA, ZEISLOFT R ([id number]) [id number] Page 7 of 9 Pain Management and Medication Current Pain Management: Electronic Signature(s) Signed: 04/27/2023 4:57:03 PM By: Midge Aver MSN RN CNS WTA Signed: 04/29/2023 8:42:03 AM By: Joe Barry Entered By: Joe Barry on 04/27/2023 10:10:25 -------------------------------------------------------------------------------- Patient/Caregiver Education Details Patient Name: Date of Service: Joe Barry R. 8/13/2024andnbsp10:00 A M Medical Record Number: [id number] Patient Account Number: [id number] Date of Birth/Gender: Treating RN: 10-16-1933 (89 y.o. Joe Barry Primary Care Physician: Joe Barry Other Clinician: Betha Barry Referring Physician: Treating Physician/Extender: Joe Barry in Treatment: 8 Education Assessment Education Provided To: Patient Education Topics Provided Wound/Skin Impairment: Handouts: Other: continue wound care as directed Methods: Explain/Verbal Responses: State content correctly Electronic Signature(s) Signed: 04/29/2023 8:42:03 AM By: Joe Barry  Entered By: Joe Barry on 04/27/2023 11:14:49 Joe Barry ([id number]) [id number] Page 8 of 9 -------------------------------------------------------------------------------- Wound  Assessment Details Patient Name: Date of Service: Joe Barry [facility] R. 04/27/2023 10:00 A M Medical Record Number: [id number] Patient Account Number: [id number] Date of Birth/Sex: Treating RN: 1934-06-27 (89 y.o. Joe Barry Primary Care Yobany Vroom: Joe Barry Other Clinician: Betha Barry Referring Devina Bezold: Treating Joe Barry/Extender: Joe Barry in Treatment: 8 Wound Status Wound Number: 1 Primary Trauma, Other Etiology: Wound Location: Left, Proximal, Midline Lower Leg Wound Open Wounding Event: Trauma Status: Date Acquired: 01/14/2023 Comorbid Cataracts, Lymphedema, Hypertension, Peripheral Venous Weeks Of Treatment: 8 History: Disease, Type II Diabetes Clustered Wound: No Photos Wound Measurements Length: (cm) 1.6 Width: (cm) 0.4 Depth: (cm) 0.2 Area: (cm) 0.503 Volume: (cm) 0.101 % Reduction in Area: 95.3% % Reduction in Volume: 96.9% Epithelialization: Small (1-33%) Wound Description Classification: Full Thickness Without Exposed Support Structures Exudate Amount: Medium Exudate Type: Serosanguineous Exudate Color: red, brown Foul Odor After Cleansing: No Slough/Fibrino Yes Wound Bed Granulation Amount: Small (1-33%) Exposed Structure Granulation Quality: Red Fascia Exposed: No Necrotic Amount: Large (67-100%) Fat Layer (Subcutaneous Tissue) Exposed: Yes Necrotic Quality: Adherent Slough Tendon Exposed: No Muscle Exposed: No Joint Exposed: No Bone Exposed: No Treatment Notes Wound #1 (Lower Leg) Wound Laterality: Left, Midline, Proximal Cleanser Soap and Water Discharge Instruction: Gently cleanse wound with antibacterial soap, rinse and pat dry prior to dressing wounds Peri-Wound Care Joe Barry, Joe Barry ([id number]) [id number] Page 9 of 9 Topical Primary Dressing Promogran Matrix 4.34 (in) Discharge Instruction: Moisten w/normal saline or sterile water; Cover wound as directed. Do not remove from wound  bed. Secondary Dressing Zetuvit Plus 4x8 (in/in) oil emersion dressing Discharge Instruction: place over collagen Secured With Compression Wrap Urgo K2 Lite, two layer compression system, regular Compression Stockings Add-Ons Electronic Signature(s) Signed: 04/27/2023 4:57:03 PM By: Midge Aver MSN RN CNS WTA Signed: 04/29/2023 8:42:03 AM By: Joe Barry Entered By: Joe Barry on 04/27/2023 10:17:13 -------------------------------------------------------------------------------- Vitals Details Patient Name: Date of Service: Joe Barry [name] 04/27/2023 10:00 A M Medical Record Number: [id number] Patient Account Number: [id number] Date of Birth/Sex: Treating RN: July 17, 1934 (89 y.o. Joe Barry Primary Care Richardo Popoff: Joe Barry Other Clinician: Betha Barry Referring Zacheriah Stumpe: Treating Elana Jian/Extender: Joe Barry in Treatment: 8 Vital Signs Time Taken: 10:08 Temperature (F): 97.8 Height (in): 65 Pulse (bpm): 83 Weight (lbs): 180 Respiratory Rate (breaths/min): 16 Body Mass Index (BMI): 30 Blood Pressure (mmHg): 171/80 Reference Range: 80 - 120 mg / dl Electronic Signature(s) Signed: 04/29/2023 8:42:03 AM By: Joe Barry Entered By: Joe Barry on 04/27/2023 10:10:21

## 2023-05-04 ENCOUNTER — Encounter: Payer: Medicare PPO | Admitting: Physician Assistant

## 2023-05-04 DIAGNOSIS — E1151 Type 2 diabetes mellitus with diabetic peripheral angiopathy without gangrene: Secondary | ICD-10-CM | POA: Diagnosis not present

## 2023-05-04 DIAGNOSIS — L97822 Non-pressure chronic ulcer of other part of left lower leg with fat layer exposed: Secondary | ICD-10-CM | POA: Diagnosis not present

## 2023-05-04 DIAGNOSIS — E11622 Type 2 diabetes mellitus with other skin ulcer: Secondary | ICD-10-CM | POA: Diagnosis not present

## 2023-05-04 DIAGNOSIS — Z7984 Long term (current) use of oral hypoglycemic drugs: Secondary | ICD-10-CM | POA: Diagnosis not present

## 2023-05-04 DIAGNOSIS — I890 Lymphedema, not elsewhere classified: Secondary | ICD-10-CM | POA: Diagnosis not present

## 2023-05-04 DIAGNOSIS — E119 Type 2 diabetes mellitus without complications: Secondary | ICD-10-CM | POA: Diagnosis not present

## 2023-05-04 DIAGNOSIS — I10 Essential (primary) hypertension: Secondary | ICD-10-CM | POA: Diagnosis not present

## 2023-05-04 DIAGNOSIS — S81812A Laceration without foreign body, left lower leg, initial encounter: Secondary | ICD-10-CM | POA: Diagnosis not present

## 2023-05-04 DIAGNOSIS — E11621 Type 2 diabetes mellitus with foot ulcer: Secondary | ICD-10-CM | POA: Diagnosis not present

## 2023-05-04 NOTE — Progress Notes (Addendum)
DANLEY, ALARCON ([id number]) [id number] Page 1 of 8 Visit Report for 05/04/2023 Arrival Information Details Patient Name: Date of Service: Joe Barry [facility] R. 05/04/2023 10:30 A M Medical Record Number: [id number] Patient Account Number: [id number] Date of Birth/Sex: Treating RN: November 13, 1933 (89 y.o. [name], [name] Primary Care Shaylene Paganelli: [name] Other Clinician: Referring Hurley Blevins: Treating Masami Plata/Extender: Kallie Edward in Treatment: 9 Visit Information History Since Last Visit All ordered tests and consults were completed: No Patient Arrived: Wheel Chair Added or deleted any medications: No Arrival Time: 10:37 Any new allergies or adverse reactions: No Accompanied By: family member Had a fall or experienced change in No Transfer Assistance: None activities of daily living that may affect Patient Identification Verified: Yes risk of falls: Secondary Verification Process Completed: Yes Signs or symptoms of abuse/neglect since last visito No Patient Requires Transmission-Based Precautions: No Has Dressing in Place as Prescribed: Yes Patient Has Alerts: No Has Compression in Place as Prescribed: Yes Pain Present Now: No Electronic Signature(s) Signed: 05/12/2023 5:16:58 PM By: Bonnell Public Entered By: Bonnell Public on 05/04/2023 10:39:10 -------------------------------------------------------------------------------- Clinic Level of Care Assessment Details Patient Name: Date of Service: Joe Barry [facility] R. 05/04/2023 10:30 A M Medical Record Number: [id number] Patient Account Number: [id number] Date of Birth/Sex: Treating RN: 1934/08/22 (89 y.o. [name], [name] Primary Care Torien Ramroop: [name] Other Clinician: Referring Kenzlie Disch: Treating Nakeshia Waldeck/Extender: Kallie Edward in Treatment: 9 Clinic Level of Care Assessment Items TOOL 4 Quantity Score []  - 0 Use when only an EandM is performed on FOLLOW-UP  visit ASSESSMENTS - Nursing Assessment / Reassessment X- 1 10 Reassessment of Co-morbidities (includes updates in patient status) X- 1 5 Reassessment of Adherence to Treatment Plan ASSESSMENTS - Wound and Skin A ssessment / Reassessment X - Simple Wound Assessment / Reassessment - one wound 1 5 GASPER, ISHIKAWA R ([id number]) [id number] Page 2 of 8 []  - 0 Complex Wound Assessment / Reassessment - multiple wounds []  - 0 Dermatologic / Skin Assessment (not related to wound area) ASSESSMENTS - Focused Assessment []  - 0 Circumferential Edema Measurements - multi extremities []  - 0 Nutritional Assessment / Counseling / Intervention []  - 0 Lower Extremity Assessment (monofilament, tuning fork, pulses) []  - 0 Peripheral Arterial Disease Assessment (using hand held doppler) ASSESSMENTS - Ostomy and/or Continence Assessment and Care []  - 0 Incontinence Assessment and Management []  - 0 Ostomy Care Assessment and Management (repouching, etc.) PROCESS - Coordination of Care []  - 0 Simple Patient / Family Education for ongoing care []  - 0 Complex (extensive) Patient / Family Education for ongoing care X- 1 10 Staff obtains Chiropractor, Records, T Results / Process Orders est []  - 0 Staff telephones HHA, Nursing Homes / Clarify orders / etc []  - 0 Routine Transfer to another Facility (non-emergent condition) []  - 0 Routine Hospital Admission (non-emergent condition) []  - 0 New Admissions / Manufacturing engineer / Ordering NPWT Apligraf, etc. , []  - 0 Emergency Hospital Admission (emergent condition) X- 1 10 Simple Discharge Coordination []  - 0 Complex (extensive) Discharge Coordination PROCESS - Special Needs []  - 0 Pediatric / Minor Patient Management []  - 0 Isolation Patient Management []  - 0 Hearing / Language / Visual special needs []  - 0 Assessment of Community assistance (transportation, D/C planning, etc.) []  - 0 Additional assistance /  Altered mentation []  - 0 Support Surface(s) Assessment (bed, cushion, seat, etc.) INTERVENTIONS - Wound Cleansing / Measurement []  - 0 Simple Wound Cleansing - one wound []  -  0 Complex Wound Cleansing - multiple wounds X- 1 5 Wound Imaging (photographs - any number of wounds) []  - 0 Wound Tracing (instead of photographs) []  - 0 Simple Wound Measurement - one wound []  - 0 Complex Wound Measurement - multiple wounds INTERVENTIONS - Wound Dressings []  - 0 Small Wound Dressing one or multiple wounds []  - 0 Medium Wound Dressing one or multiple wounds []  - 0 Large Wound Dressing one or multiple wounds []  - 0 Application of Medications - topical []  - 0 Application of Medications - injection INTERVENTIONS - Miscellaneous []  - 0 External ear exam []  - 0 Specimen Collection (cultures, biopsies, blood, body fluids, etc.) FRANCISCUS, ZISK ([id number]) [id number] Page 3 of 8 []  - 0 Specimen(s) / Culture(s) sent or taken to Lab for analysis []  - 0 Patient Transfer (multiple staff / Nurse, adult / Similar devices) []  - 0 Simple Staple / Suture removal (25 or less) []  - 0 Complex Staple / Suture removal (26 or more) []  - 0 Hypo / Hyperglycemic Management (close monitor of Blood Glucose) []  - 0 Ankle / Brachial Index (ABI) - do not check if billed separately X- 1 5 Vital Signs Has the patient been seen at the hospital within the last three years: Yes Total Score: 50 Level Of Care: New/Established - Level 2 Electronic Signature(s) Signed: 05/12/2023 5:16:58 PM By: Bonnell Public Entered By: Bonnell Public on 05/04/2023 10:50:38 -------------------------------------------------------------------------------- Encounter Discharge Information Details Patient Name: Date of Service: Joe Barry [name] 05/04/2023 10:30 A M Medical Record Number: [id number] Patient Account Number: [id number] Date of Birth/Sex: Treating RN: [birth year] (89 y.o. [name],  [name] Primary Care Jerrianne Hartin: [name] Other Clinician: Referring Roscoe Witts: Treating Jara Feider/Extender: Kallie Edward in Treatment: 9 Encounter Discharge Information Items Discharge Condition: Stable Ambulatory Status: Wheelchair Discharge Destination: Home Transportation: Private Auto Accompanied By: family Schedule Follow-up Appointment: No Clinical Summary of Care: Electronic Signature(s) Signed: 05/12/2023 5:16:58 PM By: Bonnell Public Entered By: Bonnell Public on 05/04/2023 11:06:03 -------------------------------------------------------------------------------- Lower Extremity Assessment Details Patient Name: Date of Service: Joe Barry [place] R. 05/04/2023 10:30 A M Medical Record Number: [id number] Patient Account Number: [id number] Date of Birth/Sex: Treating RN: 1934/02/19 (89 y.o. [address] [place], [place] R ([id number]) [id number] Page 4 of 8 Primary Care Rafaella Kole: [name] Other Clinician: Referring Branch Pacitti: Treating Kalecia Hartney/Extender: Kallie Edward in Treatment: 9 Edema Assessment Assessed: [Left: No] [Right: No] Edema: [Left: N] [Right: o] Vascular Assessment Pulses: Dorsalis Pedis Palpable: [Left:Yes] Extremity colors, hair growth, and conditions: Hair Growth on Extremity: [Left:No] Temperature of Extremity: [Left:Warm] Capillary Refill: [Left:< 3 seconds] Dependent Rubor: [Left:No] Blanched when Elevated: [Left:No No] Toe Nail Assessment Left: Right: Thick: Yes Discolored: Yes Deformed: Yes Improper Length and Hygiene: No Electronic Signature(s) Signed: 05/12/2023 5:16:58 PM By: Bonnell Public Entered By: Bonnell Public on 05/04/2023 10:49:06 -------------------------------------------------------------------------------- Multi Wound Chart Details Patient Name: Date of Service: Joe Barry [name] 05/04/2023 10:30 A M Medical Record Number: [id number] Patient Account Number:  [id number] Date of Birth/Sex: Treating RN: October 21, 1933 (89 y.o. [name], [name] Primary Care Brittanni Cariker: [name] Other Clinician: Referring Keiron Iodice: Treating Helmuth Recupero/Extender: Kallie Edward in Treatment: 9 Vital Signs Height(in): 65 Pulse(bpm): 79 Weight(lbs): 180 Blood Pressure(mmHg): 136/72 Body Mass Index(BMI): 30 Temperature(F): 97.8 Respiratory Rate(breaths/min): 18 [1:Photos:] [N/A:N/A] Left, Proximal, Midline Lower Leg N/A N/A Wound Location: Trauma N/A N/A Wounding Event: Trauma, Other N/A N/A Primary Etiology: Cataracts, Lymphedema, N/A N/A Comorbid History: Hypertension, Peripheral  Venous Disease, Type II Diabetes 01/14/2023 N/A N/A Date Acquired: 9 N/A N/A Weeks of Treatment: Healed - Epithelialized N/A N/A Wound Status: No N/A N/A Wound Recurrence: 0x0x0 N/A N/A Measurements L x W x D (cm) 0 N/A N/A A (cm) : rea 0 N/A N/A Volume (cm) : 100.00% N/A N/A % Reduction in Area: 100.00% N/A N/A % Reduction in Volume: Full Thickness Without Exposed N/A N/A Classification: Support Structures Small N/A N/A Exudate Amount: Serous N/A N/A Exudate Type: amber N/A N/A Exudate Color: None Present (0%) N/A N/A Granulation Amount: None Present (0%) N/A N/A Necrotic Amount: Fascia: No N/A N/A Exposed Structures: Fat Layer (Subcutaneous Tissue): No Tendon: No Muscle: No Joint: No Bone: No Small (1-33%) N/A N/A Epithelialization: Treatment Notes Electronic Signature(s) Signed: 05/12/2023 5:16:58 PM By: Bonnell Public Entered By: Bonnell Public on 05/04/2023 10:49:44 -------------------------------------------------------------------------------- Multi-Disciplinary Care Plan Details Patient Name: Date of Service: Joe Barry [name] 05/04/2023 10:30 A M Medical Record Number: [id number] Patient Account Number: [id number] Date of Birth/Sex: Treating RN: 05-16-1934 (89 y.o. [name], [name] Primary Care Jalasia Eskridge: [name] Other  Clinician: Referring Ehtan Delfavero: Treating Gwendloyn Forsee/Extender: Kallie Edward in Treatment: 9 Active Inactive Electronic Signature(s) Signed: 05/12/2023 5:16:58 PM By: Bonnell Public Entered By: Bonnell Public on 05/04/2023 11:00:02 JOHNNATHAN, DOWNS ([id number]) [id number] Page 6 of 8 -------------------------------------------------------------------------------- Pain Assessment Details Patient Name: Date of Service: Joe Barry [facility] R. 05/04/2023 10:30 A M Medical Record Number: [id number] Patient Account Number: [id number] Date of Birth/Sex: Treating RN: 25-Aug-1934 (89 y.o. [name], [name] Primary Care Matika Bartell: [name] Other Clinician: Referring Renwick Asman: Treating Cedarius Kersh/Extender: Kallie Edward in Treatment: 9 Active Problems Location of Pain Severity and Description of Pain Patient Has Paino No Site Locations Pain Management and Medication Current Pain Management: Electronic Signature(s) Signed: 05/12/2023 5:16:58 PM By: Bonnell Public Entered By: Bonnell Public on 05/04/2023 10:39:57 -------------------------------------------------------------------------------- Patient/Caregiver Education Details Patient Name: Date of Service: Delynn Flavin R. 8/20/2024andnbsp10:30 A M Medical Record Number: [id number] Patient Account Number: [id number] Date of Birth/Gender: Treating RN: 1934-06-19 (89 y.o. [name], [name] Primary Care Physician: [name] Other Clinician: Referring Physician: Treating Physician/Extender: Kallie Edward in Treatment: [address] [place] R ([id number]) [phone].pdf Page 7 of 8 Education Assessment Education Provided To: Patient Education Topics Provided Discharge Packet: Handouts: Controlling Swelling with Compression Stockings Methods: Explain/Verbal Responses: State content correctly Electronic Signature(s) Signed: 05/12/2023 5:16:58 PM By:  Bonnell Public Entered By: Bonnell Public on 05/04/2023 11:04:53 -------------------------------------------------------------------------------- Wound Assessment Details Patient Name: Date of Service: Joe Barry [facility] R. 05/04/2023 10:30 A M Medical Record Number: [id number] Patient Account Number: [id number] Date of Birth/Sex: Treating RN: 1934-01-31 (89 y.o. [name], [name] Primary Care Shiv Shuey: [name] Other Clinician: Referring Aireanna Luellen: Treating Jori Thrall/Extender: Kallie Edward in Treatment: 9 Wound Status Wound Number: 1 Primary Trauma, Other Etiology: Wound Location: Left, Proximal, Midline Lower Leg Wound Healed - Epithelialized Wounding Event: Trauma Status: Date Acquired: 01/14/2023 Comorbid Cataracts, Lymphedema, Hypertension, Peripheral Venous Weeks Of Treatment: 9 History: Disease, Type II Diabetes Clustered Wound: No Photos Wound Measurements Length: (cm) Width: (cm) Depth: (cm) Area: (cm) Volume: (cm) 0 % Reduction in Area: 100% 0 % Reduction in Volume: 100% 0 Epithelialization: Small (1-33%) 0 0 Wound Description Classification: Full Thickness Without Exposed Support Structures Exudate Amount: Small Exudate Type: Serous SENCERE, VANNAME ([id number]) Exudate Color: amber Foul Odor After Cleansing: No Slough/Fibrino No [phone].pdf Page 8 of 8 Wound  Bed Granulation Amount: None Present (0%) Exposed Structure Necrotic Amount: None Present (0%) Fascia Exposed: No Fat Layer (Subcutaneous Tissue) Exposed: No Tendon Exposed: No Muscle Exposed: No Joint Exposed: No Bone Exposed: No Treatment Notes Wound #1 (Lower Leg) Wound Laterality: Left, Midline, Proximal Cleanser Peri-Wound Care Topical Primary Dressing Secondary Dressing Secured With Compression Wrap Compression Stockings Add-Ons Electronic Signature(s) Signed: 05/12/2023 5:16:58 PM By: Bonnell Public Entered By: Bonnell Public on 05/04/2023  10:45:29 -------------------------------------------------------------------------------- Vitals Details Patient Name: Date of Service: Joe Barry [name] 05/04/2023 10:30 A M Medical Record Number: [id number] Patient Account Number: [id number] Date of Birth/Sex: Treating RN: 1933/09/27 (89 y.o. [name], [name] Primary Care Talal Fritchman: [name] Other Clinician: Referring Haim Hansson: Treating Dyamond Tolosa/Extender: Kallie Edward in Treatment: 9 Vital Signs Time Taken: 10:38 Temperature (F): 97.8 Height (in): 65 Pulse (bpm): 79 Weight (lbs): 180 Respiratory Rate (breaths/min): 18 Body Mass Index (BMI): 30 Blood Pressure (mmHg): 136/72 Reference Range: 80 - 120 mg / dl Electronic Signature(s) Signed: 05/12/2023 5:16:58 PM By: Bonnell Public Entered By: Bonnell Public on 05/04/2023 10:39:44

## 2023-05-04 NOTE — Progress Notes (Addendum)
TREYSON, WALEK ([id number]) [id number] Page 1 of 6 Visit Report for 05/04/2023 Chief Complaint Document Details Patient Name: Date of Service: Joe Barry [facility] R. 05/04/2023 10:30 A M Medical Record Number: [id number] Patient Account Number: [id number] Date of Birth/Sex: Treating RN: 06-28-1934 (89 y.o. [name], [name] Primary Care Provider: [name] Other Clinician: Referring Provider: Treating Provider/Extender: Kallie Edward in Treatment: 9 Information Obtained from: Patient Chief Complaint Left LE Ulcers Electronic Signature(s) Signed: 05/04/2023 10:38:57 AM By: [name] PA-C Entered By: [name] on 05/04/2023 10:38:57 -------------------------------------------------------------------------------- HPI Details Patient Name: Date of Service: Joe Barry [name] 05/04/2023 10:30 A M Medical Record Number: [id number] Patient Account Number: [id number] Date of Birth/Sex: Treating RN: July 22, 1934 (89 y.o. [name], [name] Primary Care Provider: [name] Other Clinician: Referring Provider: Treating Provider/Extender: Kallie Edward in Treatment: 9 History of Present Illness HPI Description: 02-26-2023 upon evaluation today patient presents for initial inspection here in our clinic concerning issues that he has been having with a wound on his left anterior lower leg. This actually began on 01-14-2023 when the patient was getting out of the car and subsequently they were turning the wheelchair around to move around the door and got too close to the edge of the door and the corner at the bottom actually cut his leg. This because of his lymphedema has been a very hard wound to heal following. Fortunately there does not appear to be any signs of infection systemically which is good news. With that being said even locally I do not see any signs of infection at this point. I do believe that he is making fairly good progress  all things considered although I think that we definitely need to clean some this out and get it moving in the right direction. Patient has been using Medihoney or rather his daughter has at home she is a Engineer, civil (consulting) she works in the ICU office seen her previously as well as a patient. Subsequently after it was apparent that this was not getting better they did go on 7 June to [place] [facility] and at that point they actually put the patient on Augmentin which she started on 02-19-2023 and is still continuing that through current. His most recent hemoglobin A1c was 6.9 on 01-16-2023. He did have an x-ray done of the leg but this has not been released yet it still pending a reading. Currently could not tolerate the ABI screening but he seems to have good perfusion in regard to his leg I do not know that we go to gotten a lot of a good reading anyway when it comes to the ABIs even if we were able to do the screening due to the amount of lymphedema that he has with associated swelling. Patient does have a history of lymphedema, diabetes mellitus type 2, he is on oral hypoglycemic agents. He also has a history of hypertension and peripheral vascular disease although his last ABI check in 2018 was normal according to his daughter and he has seen vascular again at that time but has not had any recent ABIs performed. SKYE, MCCLAMMY ([id number]) [id number] Page 2 of 6 03-09-2023 upon evaluation today patient appears to be doing well currently in regard to his wounds. He has been tolerating the dressing changes without complication. Fortunately I see signs of definite improvement and very pleased in this regard. 03-16-2023 upon evaluation today patient appears to be doing well currently in regard to  his wound which is actually showing signs of improvement. Fortunately I do not see any evidence of active infection locally nor systemically at this time. 03-23-2023 upon evaluation today  patient appears to be doing well currently in regard to his wound. He has been tolerating the dressing changes without complication. The wound actually showed signs of excellent improvement which is great news and very pleased in that regard. 03-30-2023 upon evaluation today patient appears to be doing well currently in regard to his wound which is actually showing signs of significant improvement. Fortunately there does not appear to be any signs of active infection locally or systemically which is great news. No fevers, chills, nausea, vomiting, or diarrhea. 7/23; this was a car door injury in the anterior mid tibia. We have been making excellent progress using collagen under Urgo K2 light compression.He has chronic edema probably lymphedema in the left leg 04-13-2023 upon evaluation today patient appears to be doing well currently in regard to his wound. He has been tolerating the dressing changes without complication. Fortunately I do not see any evidence of infection locally or systemically which is great news. No fevers, chills, nausea, vomiting, or diarrhea. 04-20-2023 upon evaluation today patient appears to be doing well currently in regard to his wound. This actually showing signs of excellent improvement. And 5 mm lidocaine after performing obstructive regimen today which is great news. Fortunately there is no signs of infection. 04-27-2023 upon evaluation today patient appears to be doing excellent in regard to his leg ulcer. Has been tolerating the dressing changes without complication. Fortunately I do not see any signs of active infection at this time. 05-04-2023 upon evaluation today patient appears to be doing well currently in regard to his leg ulcer. He has been tolerating the dressing changes without complication. Fortunately I do not see anything of consequence that is open at this point and he seems to be doing quite well. I am extremely pleased with where we stand at this  time. Electronic Signature(s) Signed: 05/04/2023 6:26:18 PM By: [name] PA-C Entered By: [name] on 05/04/2023 18:26:18 -------------------------------------------------------------------------------- Physical Exam Details Patient Name: Date of Service: Joe Barry [place] R. 05/04/2023 10:30 A M Medical Record Number: [id number] Patient Account Number: [id number] Date of Birth/Sex: Treating RN: June 08, 1934 (89 y.o. [name], [name] Primary Care Provider: [name] Other Clinician: Referring Provider: Treating Provider/Extender: Kallie Edward in Treatment: 9 Constitutional Well-nourished and well-hydrated in no acute distress. Respiratory normal breathing without difficulty. Psychiatric this patient is able to make decisions and demonstrates good insight into disease process. Alert and Oriented x 3. pleasant and cooperative. Notes Upon inspection patient's wound bed actually showed signs of good granulation epithelization at this point. Fortunately I do not see any signs of worsening overall and I think the patient is making good headway towards complete closure. In fact it looks to be that the patient's wound is healed today. Electronic Signature(s) Signed: 05/04/2023 6:26:35 PM By: [name] PA-C Entered By: [name] on 05/04/2023 18:26:35 ARLESTER, ARREDONDO ([id number]) [id number] Page 3 of 6 -------------------------------------------------------------------------------- Physician Orders Details Patient Name: Date of Service: Joe Barry [facility] R. 05/04/2023 10:30 A M Medical Record Number: [id number] Patient Account Number: [id number] Date of Birth/Sex: Treating RN: 02-17-1934 (89 y.o. [name], [name] Primary Care Provider: [name] Other Clinician: Referring Provider: Treating Provider/Extender: Kallie Edward in Treatment: 9 Verbal / Phone Orders: No Diagnosis Coding ICD-10 Coding Code  Description [phone] Laceration without  foreign body, left lower leg, initial encounter L97.822 Non-pressure chronic ulcer of other part of left lower leg with fat layer exposed I89.0 Lymphedema, not elsewhere classified E11.622 Type 2 diabetes mellitus with other skin ulcer Z79.84 Long term (current) use of oral hypoglycemic drugs I10 Essential (primary) hypertension I73.89 Other specified peripheral vascular diseases Discharge From [facility] Services Discharge from Wound Care Center Treatment Complete Electronic Signature(s) Signed: 05/04/2023 6:45:54 PM By: [name] PA-C Signed: 05/12/2023 5:16:58 PM By: Bonnell Public Entered By: Bonnell Public on 05/04/2023 10:50:00 -------------------------------------------------------------------------------- Problem List Details Patient Name: Date of Service: Joe Barry [name] 05/04/2023 10:30 A M Medical Record Number: [id number] Patient Account Number: [id number] Date of Birth/Sex: Treating RN: Mar 01, 1934 (89 y.o. [name], [name] Primary Care Provider: [name] Other Clinician: Referring Provider: Treating Provider/Extender: Kallie Edward in Treatment: 9 Active Problems ICD-10 Encounter Code Description Active Date MDM Diagnosis S81.812A Laceration without foreign body, left lower leg, initial encounter 02/26/2023 No Yes SAAHAS, MCPHIE ([id number]) [id number] Page 4 of 6 [phone] Non-pressure chronic ulcer of other part of left lower leg with fat layer exposed6/14/2024 No Yes I89.0 Lymphedema, not elsewhere classified 02/26/2023 No Yes E11.622 Type 2 diabetes mellitus with other skin ulcer 02/26/2023 No Yes Z79.84 Long term (current) use of oral hypoglycemic drugs 02/26/2023 No Yes I10 Essential (primary) hypertension 02/26/2023 No Yes I73.89 Other specified peripheral vascular diseases 02/26/2023 No Yes Inactive Problems Resolved Problems Electronic Signature(s) Signed: 05/04/2023 10:38:46 AM  By: [name] PA-C Entered By: [name] on 05/04/2023 10:38:46 -------------------------------------------------------------------------------- Progress Note Details Patient Name: Date of Service: Joe Barry [name] 05/04/2023 10:30 A M Medical Record Number: [id number] Patient Account Number: [id number] Date of Birth/Sex: Treating RN: October 02, 1933 (89 y.o. [name], [name] Primary Care Provider: [name] Other Clinician: Referring Provider: Treating Provider/Extender: Kallie Edward in Treatment: 9 Subjective Chief Complaint Information obtained from Patient Left LE Ulcers History of Present Illness (HPI) 02-26-2023 upon evaluation today patient presents for initial inspection here in our clinic concerning issues that he has been having with a wound on his left anterior lower leg. This actually began on 01-14-2023 when the patient was getting out of the car and subsequently they were turning the wheelchair around to move around the door and got too close to the edge of the door and the corner at the bottom actually cut his leg. This because of his lymphedema has been a very hard wound to heal following. Fortunately there does not appear to be any signs of infection systemically which is good news. With that being said even locally I do not see any signs of infection at this point. I do believe that he is making fairly good progress all things considered although I think that we definitely need to clean some this out and get it moving in the right direction. Patient has been using Medihoney or rather his daughter has at home she is a Engineer, civil (consulting) she works in the ICU office seen her previously as well as a patient. Subsequently after it was apparent that this was not getting better they did go on 7 June to [place] [facility] and at that point they actually put the patient on Augmentin which she started on 02-19-2023 and is still continuing that through current. His most  recent hemoglobin A1c was 6.9 on 01-16-2023. He did have an x-ray done of the leg but this has not been released yet it still pending a reading. Currently  could not tolerate the ABI screening but he seems to have good perfusion in regard to his leg I do not know that we go to gotten a lot of a good reading anyway when it comes to the ABIs even if we were able to do the DORIN, XIE ([id number]) [id number] Page 5 of 6 screening due to the amount of lymphedema that he has with associated swelling. Patient does have a history of lymphedema, diabetes mellitus type 2, he is on oral hypoglycemic agents. He also has a history of hypertension and peripheral vascular disease although his last ABI check in 2018 was normal according to his daughter and he has seen vascular again at that time but has not had any recent ABIs performed. 03-09-2023 upon evaluation today patient appears to be doing well currently in regard to his wounds. He has been tolerating the dressing changes without complication. Fortunately I see signs of definite improvement and very pleased in this regard. 03-16-2023 upon evaluation today patient appears to be doing well currently in regard to his wound which is actually showing signs of improvement. Fortunately I do not see any evidence of active infection locally nor systemically at this time. 03-23-2023 upon evaluation today patient appears to be doing well currently in regard to his wound. He has been tolerating the dressing changes without complication. The wound actually showed signs of excellent improvement which is great news and very pleased in that regard. 03-30-2023 upon evaluation today patient appears to be doing well currently in regard to his wound which is actually showing signs of significant improvement. Fortunately there does not appear to be any signs of active infection locally or systemically which is great news. No fevers, chills, nausea,  vomiting, or diarrhea. 7/23; this was a car door injury in the anterior mid tibia. We have been making excellent progress using collagen under Urgo K2 light compression.He has chronic edema probably lymphedema in the left leg 04-13-2023 upon evaluation today patient appears to be doing well currently in regard to his wound. He has been tolerating the dressing changes without complication. Fortunately I do not see any evidence of infection locally or systemically which is great news. No fevers, chills, nausea, vomiting, or diarrhea. 04-20-2023 upon evaluation today patient appears to be doing well currently in regard to his wound. This actually showing signs of excellent improvement. And 5 mm lidocaine after performing obstructive regimen today which is great news. Fortunately there is no signs of infection. 04-27-2023 upon evaluation today patient appears to be doing excellent in regard to his leg ulcer. Has been tolerating the dressing changes without complication. Fortunately I do not see any signs of active infection at this time. 05-04-2023 upon evaluation today patient appears to be doing well currently in regard to his leg ulcer. He has been tolerating the dressing changes without complication. Fortunately I do not see anything of consequence that is open at this point and he seems to be doing quite well. I am extremely pleased with where we stand at this time. Objective Constitutional Well-nourished and well-hydrated in no acute distress. Vitals Time Taken: 10:38 AM, Height: 65 in, Weight: 180 lbs, BMI: 30, Temperature: 97.8 F, Pulse: 79 bpm, Respiratory Rate: 18 breaths/min, Blood Pressure: 136/72 mmHg. Respiratory normal breathing without difficulty. Psychiatric this patient is able to make decisions and demonstrates good insight into disease process. Alert and Oriented x 3. pleasant and cooperative. General Notes: Upon inspection patient's wound bed actually showed signs of good  granulation epithelization at  this point. Fortunately I do not see any signs of worsening overall and I think the patient is making good headway towards complete closure. In fact it looks to be that the patient's wound is healed today. Integumentary (Hair, Skin) Wound #1 status is Healed - Epithelialized. Original cause of wound was Trauma. The date acquired was: 01/14/2023. The wound has been in treatment 9 weeks. The wound is located on the Left,Proximal,Midline Lower Leg. The wound measures 0cm length x 0cm width x 0cm depth; 0cm^2 area and 0cm^3 volume. There is a small amount of serous drainage noted. There is no granulation within the wound bed. There is no necrotic tissue within the wound bed. Assessment Active Problems ICD-10 Laceration without foreign body, left lower leg, initial encounter Non-pressure chronic ulcer of other part of left lower leg with fat layer exposed Lymphedema, not elsewhere classified Type 2 diabetes mellitus with other skin ulcer Long term (current) use of oral hypoglycemic drugs Essential (primary) hypertension Other specified peripheral vascular diseases Plan DIDIER, BRUMLEY ([id number]) [id number] Page 6 of 6 Discharge From [facility] Services: Discharge from Wound Care Center Treatment Complete 1. I would recommend that we have the patient continue to monitor for any signs of infection. 2. I would recommend that he go and get put into the Velcro wrap which I think should do quite well for him. Will see him back for a follow-up visit as needed. Electronic Signature(s) Signed: 05/04/2023 6:27:00 PM By: [name] PA-C Entered By: [name] on 05/04/2023 18:27:00 -------------------------------------------------------------------------------- SuperBill Details Patient Name: Date of Service: Joe Barry [place] R. 05/04/2023 Medical Record Number: [id number] Patient Account Number: [id number] Date of Birth/Sex: Treating RN: 1933-11-29 (89  y.o. [name], [name] Primary Care Provider: [name] Other Clinician: Referring Provider: Treating Provider/Extender: Kallie Edward in Treatment: 9 Diagnosis Coding ICD-10 Codes Code Description [phone] Laceration without foreign body, left lower leg, initial encounter L97.822 Non-pressure chronic ulcer of other part of left lower leg with fat layer exposed I89.0 Lymphedema, not elsewhere classified E11.622 Type 2 diabetes mellitus with other skin ulcer Z79.84 Long term (current) use of oral hypoglycemic drugs I10 Essential (primary) hypertension I73.89 Other specified peripheral vascular diseases Facility Procedures : CPT4 Code: [id number] Description: [phone] - WOUND CARE VISIT-LEV 2 EST PT Modifier: Quantity: 1 Physician Procedures : CPT4 Code Description Modifier [id number] - WC PHYS LEVEL 3 - EST PT ICD-10 Diagnosis Description S81.812A Laceration without foreign body, left lower leg, initial encounter L97.822 Non-pressure chronic ulcer of other part of left lower leg with fat  layer exposed I89.0 Lymphedema, not elsewhere classified E11.622 Type 2 diabetes mellitus with other skin ulcer Quantity: 1 Electronic Signature(s) Signed: 05/04/2023 6:30:05 PM By: [name] PA-C Entered By: [name] on 05/04/2023 18:30:05

## 2023-05-16 ENCOUNTER — Other Ambulatory Visit: Payer: Self-pay | Admitting: Family Medicine

## 2023-05-16 DIAGNOSIS — E1159 Type 2 diabetes mellitus with other circulatory complications: Secondary | ICD-10-CM

## 2023-05-16 DIAGNOSIS — E118 Type 2 diabetes mellitus with unspecified complications: Secondary | ICD-10-CM

## 2023-05-31 ENCOUNTER — Ambulatory Visit: Payer: Medicare PPO | Admitting: Physician Assistant

## 2023-06-11 ENCOUNTER — Other Ambulatory Visit: Payer: Self-pay | Admitting: Family Medicine

## 2023-06-11 DIAGNOSIS — E1159 Type 2 diabetes mellitus with other circulatory complications: Secondary | ICD-10-CM

## 2023-07-05 ENCOUNTER — Ambulatory Visit: Payer: Medicare PPO | Admitting: Medical

## 2023-07-05 VITALS — BP 118/68 | HR 72 | Temp 97.9°F | Wt 174.0 lb

## 2023-07-05 DIAGNOSIS — E118 Type 2 diabetes mellitus with unspecified complications: Secondary | ICD-10-CM | POA: Diagnosis not present

## 2023-07-05 DIAGNOSIS — S91309A Unspecified open wound, unspecified foot, initial encounter: Secondary | ICD-10-CM

## 2023-07-05 DIAGNOSIS — E1169 Type 2 diabetes mellitus with other specified complication: Secondary | ICD-10-CM | POA: Diagnosis not present

## 2023-07-05 DIAGNOSIS — M79671 Pain in right foot: Secondary | ICD-10-CM | POA: Diagnosis not present

## 2023-07-05 DIAGNOSIS — I152 Hypertension secondary to endocrine disorders: Secondary | ICD-10-CM

## 2023-07-05 DIAGNOSIS — E1159 Type 2 diabetes mellitus with other circulatory complications: Secondary | ICD-10-CM

## 2023-07-05 DIAGNOSIS — I739 Peripheral vascular disease, unspecified: Secondary | ICD-10-CM

## 2023-07-05 DIAGNOSIS — Z7409 Other reduced mobility: Secondary | ICD-10-CM | POA: Diagnosis not present

## 2023-07-05 DIAGNOSIS — E785 Hyperlipidemia, unspecified: Secondary | ICD-10-CM

## 2023-07-05 DIAGNOSIS — M79672 Pain in left foot: Secondary | ICD-10-CM

## 2023-07-05 MED ORDER — GABAPENTIN 100 MG PO CAPS: 200.0000 mg | ORAL_CAPSULE | Freq: Every day | ORAL | 2 refills | Status: DC

## 2023-07-05 NOTE — Progress Notes (Signed)
Subjective:  Joe Barry is a 89 y.o. male who presents for Chief Complaint  Patient presents with   Consult    Discuss declining health. Larey Seat twice September 21 and 22nd, hasn't been mobile since. Wounds to both heels.      Here with daughter Joe Barry today.   Patient Care Team: Ronnald Nian, MD as PCP - General (Family Medicine) Wound clinic Dr. Sebastian Ache, urology Dr. Gretta Began, vascular surgery Dr. Santiago Bumpers, podiatry  Concerns: History mainly provided by daughter but also some from patient.  He lives alone, but his daughter comes over every day to check on him and he has a aide that comes in daily.  There is somebody there during the daytime helping out.  He finished wound care a little over a month ago for prior anterior left leg wound  However in the last week or so his daughter notes that he has ulcers on both heels bilaterally that are not healing.  He has not injured his feet in any way and his legs were constantly pressed on anything in particular as sometimes he will be lying on the bed with his legs hanging off the bed  He has decreased mobility currently.  He has had some falls in September and currently he may take a few steps but his mobility is mainly standing or pivoting from bed to chair.  Support devices he has at home includes bedside commode, shower chair, hospital bed, riser in the bathroom.  Walk-in shower.  He will lie in the bed 12 to 15 hours at a time  He has a history of lymphedema but when he is not moving around lymphedema is not as bad.  On the other hand he has not been moving around a lot.  He is able to feed himself.  He can bathe himself with assistance.  He is communicable and able to communicate concerns  Daughter was considering getting a wedge pillow or some type of pillow device to pressure off the heels to allow the wounds to heal  She needs some assistance with some of this care.  They cannot really maneuver to  drive him into wound care every single week  No other aggravating or relieving factors.    No other c/o.  Past Medical History:  Diagnosis Date   AAA (abdominal aortic aneurysm) (HCC)    Arthritis    BPH (benign prostatic hypertrophy)    Cataract    Diabetes mellitus    Dyslipidemia    Hypertension    Dr. Romilda Joy,    Neuromuscular disorder [facility])    carpal tunnel in right wrist   No blood products    Refusal of blood transfusions as patient is Jehovah's Witness    Current Outpatient Medications on File Prior to Visit  Medication Sig Dispense Refill   Accu-Chek FastClix Lancets MISC USE AS DIRECTED 102 each 0   acetaminophen (TYLENOL) 500 MG tablet Take 500 mg by mouth every 6 (six) hours as needed for moderate pain.     amLODipine (NORVASC) 10 MG tablet TAKE 1 TABLET(10 MG) BY MOUTH DAILY 90 tablet 1   blood glucose meter kit and supplies KIT Dispense based on patient and insurance preference. Use up to four times daily as directed. 1 each 0   Blood Glucose Monitoring Suppl (FORA V12 BLOOD GLUCOSE SYSTEM) DEVI Testing once a day     finasteride (PROSCAR) 5 MG tablet Take 5 mg by mouth daily.  glucose blood (ACCU-CHEK GUIDE) test strip CHECK BLOOD GLUCOSE TWICE DAILY 200 strip 1   hydrochlorothiazide (MICROZIDE) 12.5 MG capsule TAKE 1 CAPSULE(12.5 MG) BY MOUTH DAILY 90 capsule 0   ibuprofen (ADVIL) 200 MG tablet Take 200 mg by mouth every 6 (six) hours as needed for mild pain.     JANUVIA 50 MG tablet TAKE 1 TABLET(50 MG) BY MOUTH DAILY. 90 tablet 0   Lancet Devices (LITE TOUCH LANCING DEVICE) MISC      lovastatin (MEVACOR) 40 MG tablet TAKE 1 TABLET(40 MG) BY MOUTH AT BEDTIME 30 tablet 3   MYRBETRIQ 50 MG TB24 tablet Take 50 mg by mouth daily.     pioglitazone-metformin (ACTOPLUS MET) 15-850 MG tablet TAKE 1 TABLET BY MOUTH TWICE DAILY WITH A MEAL (Patient taking differently: Take 1 tablet by mouth daily.) 180 tablet 0   No current facility-administered medications on file  prior to visit.   Past Surgical History:  Procedure Laterality Date   CARPAL TUNNEL RELEASE     CHOLECYSTECTOMY     COLONOSCOPY  2008   EYE SURGERY     bilat cataract    FALSE ANEURYSM REPAIR  07/16/2012   Procedure: REPAIR FALSE ANEURYSM;  Surgeon: Larina Earthly, MD;  Location: [facility] OR;  Service: Vascular;  Laterality: Bilateral;  false femoral aneurysm repair with femoral artery repair   GAS/FLUID EXCHANGE  01/28/2012   Procedure: GAS/FLUID EXCHANGE;  Surgeon: Sherrie George, MD;  Location: [facility] OR;  Service: Ophthalmology;  Laterality: Right;   I & D EXTREMITY  09/23/2012   Procedure: IRRIGATION AND DEBRIDEMENT EXTREMITY;  Surgeon: Larina Earthly, MD;  Location: [facility] OR;  Service: Vascular;  Laterality: Right;  Irrigation and Debridement prepubic and right groin wound   PARS PLANA VITRECTOMY W/ REPAIR OF MACULAR HOLE  01/2012   PARS PLANA VITRECTOMY W/ SCLERAL BUCKLE  01/28/2012   Procedure: PARS PLANA VITRECTOMY WITH LASER FOR MACULAR HOLE;  Surgeon: Sherrie George, MD;  Location: [facility] OR;  Service: Ophthalmology;  Laterality: Right;  25 gauge ppv right eye    SIGMOIDOSCOPY     SKIN BIOPSY Left 10/21/2020   pyogenic granuloma ulcerated    TOTAL KNEE ARTHROPLASTY  2009   Left   TRANSURETHRAL RESECTION OF PROSTATE N/A 03/11/2022   Procedure: TRANSURETHRAL RESECTION OF THE PROSTATE (TURP);  Surgeon: Sebastian Ache, MD;  Location: [facility] ORS;  Service: Urology;  Laterality: N/A;  75 MINS    The following portions of the patient's history were reviewed and updated as appropriate: allergies, current medications, past family history, past medical history, past social history, past surgical history and problem list.  ROS Otherwise as in subjective above    Objective: BP 118/68   Pulse 72   Temp 97.9 F (36.6 C)   Wt 174 lb (78.9 kg)   BMI 29.87 kg/m   General appearance: alert, no distress, well developed, well nourished 1+ bilat pedal pulses, +lymphedema of both lower legs, mild Left and  right heel bilat lateral posterior heels with ulcerations.  Left heel wound approx 1.5cm diameter with 2mm depth, Right heel wound approx 2 cm ulceration with 2mm depth, foul odor of both ulcers. Dry skin, hypertrophic toenails throughout Psych: pleasant, answers questions appropriately    Assessment: Encounter Diagnoses  Name Primary?   Nonhealing wound of heel Yes   Type 2 diabetes mellitus with complications (HCC)    Hyperlipidemia associated with type 2 diabetes mellitus (HCC)    Hypertension associated with diabetes (HCC)  PVD (peripheral vascular disease) (HCC)    Foot pain, bilateral    Impaired mobility      Plan: Urgent referral placed for wound care Clean and dressed both ulcers with new gauze and bandages Continue current medications He can use Tylenol for pain once or twice a day.  Begin trial of gabapentin nightly.  Discussed risk and benefits and proper use of medication. If any worse symptoms in the next week such as pus drainage, fever, body aches or chills, worse pain, or other symptoms then call back or recheck Continue other medicines as usual  I will review the other form she has today for some other ancillary support or care services    Callie was seen today for consult.  Diagnoses and all orders for this visit:  Nonhealing wound of heel -     AMB referral to wound care center  Type 2 diabetes mellitus with complications (HCC) -     AMB referral to wound care center  Hyperlipidemia associated with type 2 diabetes mellitus (HCC)  Hypertension associated with diabetes (HCC)  PVD (peripheral vascular disease) (HCC) -     AMB referral to wound care center  Foot pain, bilateral  Impaired mobility  Other orders -     gabapentin (NEURONTIN) 100 MG capsule; Take 2 capsules (200 mg total) by mouth at bedtime.    Follow up: pending wound care

## 2023-07-15 ENCOUNTER — Other Ambulatory Visit: Payer: Self-pay | Admitting: Family Medicine

## 2023-07-15 DIAGNOSIS — E1169 Type 2 diabetes mellitus with other specified complication: Secondary | ICD-10-CM

## 2023-07-22 ENCOUNTER — Ambulatory Visit: Payer: Medicare PPO | Admitting: Family Medicine

## 2023-07-22 ENCOUNTER — Encounter: Payer: Medicare PPO | Attending: Physician Assistant | Admitting: Physician Assistant

## 2023-07-22 DIAGNOSIS — I890 Lymphedema, not elsewhere classified: Secondary | ICD-10-CM | POA: Insufficient documentation

## 2023-07-22 DIAGNOSIS — E11622 Type 2 diabetes mellitus with other skin ulcer: Secondary | ICD-10-CM | POA: Insufficient documentation

## 2023-07-22 DIAGNOSIS — E118 Type 2 diabetes mellitus with unspecified complications: Secondary | ICD-10-CM

## 2023-07-22 DIAGNOSIS — S81812A Laceration without foreign body, left lower leg, initial encounter: Secondary | ICD-10-CM | POA: Diagnosis not present

## 2023-07-22 DIAGNOSIS — Z7984 Long term (current) use of oral hypoglycemic drugs: Secondary | ICD-10-CM | POA: Insufficient documentation

## 2023-07-22 DIAGNOSIS — I10 Essential (primary) hypertension: Secondary | ICD-10-CM | POA: Insufficient documentation

## 2023-07-22 DIAGNOSIS — L97822 Non-pressure chronic ulcer of other part of left lower leg with fat layer exposed: Secondary | ICD-10-CM | POA: Diagnosis not present

## 2023-07-22 DIAGNOSIS — E1151 Type 2 diabetes mellitus with diabetic peripheral angiopathy without gangrene: Secondary | ICD-10-CM | POA: Diagnosis not present

## 2023-07-22 DIAGNOSIS — E1169 Type 2 diabetes mellitus with other specified complication: Secondary | ICD-10-CM

## 2023-07-22 DIAGNOSIS — L89610 Pressure ulcer of right heel, unstageable: Secondary | ICD-10-CM | POA: Diagnosis not present

## 2023-07-22 DIAGNOSIS — E11621 Type 2 diabetes mellitus with foot ulcer: Secondary | ICD-10-CM | POA: Insufficient documentation

## 2023-07-22 DIAGNOSIS — I152 Hypertension secondary to endocrine disorders: Secondary | ICD-10-CM

## 2023-07-22 DIAGNOSIS — L89620 Pressure ulcer of left heel, unstageable: Secondary | ICD-10-CM | POA: Diagnosis not present

## 2023-07-26 DIAGNOSIS — E11622 Type 2 diabetes mellitus with other skin ulcer: Secondary | ICD-10-CM | POA: Diagnosis not present

## 2023-07-26 DIAGNOSIS — L89620 Pressure ulcer of left heel, unstageable: Secondary | ICD-10-CM | POA: Diagnosis not present

## 2023-07-26 DIAGNOSIS — L89610 Pressure ulcer of right heel, unstageable: Secondary | ICD-10-CM | POA: Diagnosis not present

## 2023-07-26 DIAGNOSIS — I10 Essential (primary) hypertension: Secondary | ICD-10-CM | POA: Diagnosis not present

## 2023-07-26 DIAGNOSIS — I890 Lymphedema, not elsewhere classified: Secondary | ICD-10-CM | POA: Diagnosis not present

## 2023-07-28 NOTE — Progress Notes (Signed)
MARIANA, ARENS ([id number]) [id number] Page 1 of 5 Visit Report for 07/22/2023 Abuse Risk Screen Details Patient Name: Date of Service: Joe Barry [facility] R. 07/22/2023 9:30 A M Medical Record Number: [id number] Patient Account Number: [id number] Date of Birth/Sex: Treating RN: [birth year] (89 y.o. [name]) [name] Primary Care Roshonda Sperl: [name] Other Clinician: Referring Briony Parveen: Treating Graceanna Theissen/Extender: [name] in Treatment: 0 Abuse Risk Screen Items Answer ABUSE RISK SCREEN: Has anyone close to you tried to hurt or harm you recentlyo No Do you feel uncomfortable with anyone in your familyo No Has anyone forced you do things that you didnt want to doo No Electronic Signature(s) Signed: 07/28/2023 4:42:48 PM By: [name] RN Entered By: [name] on 07/22/2023 09:45:26 -------------------------------------------------------------------------------- Activities of Daily Living Details Patient Name: Date of Service: Joe Barry [facility] R. 07/22/2023 9:30 A M Medical Record Number: [id number] Patient Account Number: [id number] Date of Birth/Sex: Treating RN: 23-Sep-1933 (89 y.o. [name]) [name] Primary Care Kastin Cerda: [name] Other Clinician: Referring Archana Eckman: Treating Lamone Ferrelli/Extender: [name] in Treatment: 0 Activities of Daily Living Items Answer Activities of Daily Living (Please select one for each item) Drive Automobile Not Able T Medications ake Need Assistance Use T elephone Need Assistance Care for Appearance Need Assistance Use T oilet Need Assistance Bath / Shower Need Assistance Dress Self Need Assistance Feed Self Completely Able Walk Not Able Get In / Out Bed Need Assistance Housework Need Assistance KAMARIAN, CASHELL R ([id number]) [phone] [email or web address] Page 2 of 5 Prepare Meals Need Assistance Handle Money Need Assistance Shop for Self Need  Assistance Electronic Signature(s) Signed: 07/28/2023 4:42:48 PM By: [name] RN Entered By: [name] on 07/22/2023 09:46:03 -------------------------------------------------------------------------------- Education Screening Details Patient Name: Date of Service: Joe Barry [name] 07/22/2023 9:30 A M Medical Record Number: [id number] Patient Account Number: [id number] Date of Birth/Sex: Treating RN: 1933-10-26 (89 y.o. [name]) [name] Primary Care Dagen Beevers: [name] Other Clinician: Referring Makeila Yamaguchi: Treating Liberti Appleton/Extender: [name] in Treatment: 0 Primary Learner Assessed: Patient Learning Preferences/Education Level/Primary Language Learning Preference: Explanation Highest Education Level: High School Preferred Language: English Cognitive Barrier Language Barrier: No Translator Needed: No Memory Deficit: No Emotional Barrier: No Cultural/Religious Beliefs Affecting Medical Care: No Physical Barrier Impaired Vision: Yes Glasses Impaired Hearing: No Decreased Hand dexterity: No Knowledge/Comprehension Knowledge Level: Medium Comprehension Level: High Ability to understand written instructions: High Motivation Anxiety Level: Anxious Cooperation: Cooperative Education Importance: Acknowledges Need Interest in Health Problems: Asks Questions Perception: Coherent Willingness to Engage in Self-Management High Activities: Readiness to Engage in Self-Management High Activities: Electronic Signature(s) Signed: 07/28/2023 4:42:48 PM By: [name] RN Entered By: [name] on 07/22/2023 09:47:21 TASHAUN, LISTER ([id number]) [id number] Page 3 of 5 -------------------------------------------------------------------------------- Fall Risk Assessment Details Patient Name: Date of Service: Joe Barry [facility] R. 07/22/2023 9:30 A M Medical Record Number: [id number] Patient Account Number: [id number] Date  of Birth/Sex: Treating RN: May 10, 1934 (89 y.o. [name]) [name] Primary Care Duran Ohern: [name] Other Clinician: Referring Eduin Friedel: Treating Danyka Merlin/Extender: [name] in Treatment: 0 Fall Risk Assessment Items Have you had 2 or more falls in the last 12 monthso 0 Yes Have you had any fall that resulted in injury in the last 12 monthso 0 Yes FALLS RISK SCREEN History of falling - immediate or within 3 months 25 Yes Secondary diagnosis (Do you have 2 or more medical diagnoseso) 0 No Ambulatory aid  None/bed rest/wheelchair/nurse 0 Yes Crutches/cane/walker 0 No Furniture 0 No Intravenous therapy Access/Saline/Heparin Lock 0 No Gait/Transferring Normal/ bed rest/ wheelchair 0 Yes Weak (short steps with or without shuffle, stooped but able to lift head while walking, may seek 0 No support from furniture) Impaired (short steps with shuffle, may have difficulty arising from chair, head down, impaired 0 No balance) Mental Status Oriented to own ability 0 Yes Electronic Signature(s) Signed: 07/28/2023 4:42:48 PM By: [name] RN Entered By: [name] on 07/22/2023 09:47:49 -------------------------------------------------------------------------------- Foot Assessment Details Patient Name: Date of Service: Joe Barry [name] 07/22/2023 9:30 A M Medical Record Number: [id number] Patient Account Number: [id number] Date of Birth/Sex: Treating RN: [birth year] (89 y.o. [name] Primary Care Eddison Searls: [name] Other Clinician: Referring Shylee Durrett: Treating Jorgeluis Gurganus/Extender: [name] in Treatment: 0 Foot Assessment Items [x]  Unable to perform due to altered mental [address] JAMARCO, SCHRUPP R ([id number]) [phone] [id number] Page 4 of 5 + = Sensation present, - = Sensation absent, C = Callus, U = Ulcer R = Redness, W = Warmth, M = Maceration, PU = Pre-ulcerative lesion F = Fissure, S =  Swelling, D = Dryness Assessment Right: Left: Other Deformity: No No Prior Foot Ulcer: No No Prior Amputation: No No Charcot Joint: No No Ambulatory Status: Non-ambulatory Assistance Device: Wheelchair Gait: Surveyor, mining) Signed: 07/22/2023 10:11:05 AM By: [name] RN Entered By: [name] on 07/22/2023 10:11:05 -------------------------------------------------------------------------------- Nutrition Risk Screening Details Patient Name: Date of Service: Joe Barry [name] 07/22/2023 9:30 A M Medical Record Number: [id number] Patient Account Number: [id number] Date of Birth/Sex: Treating RN: 11-06-1933 (89 y.o. [name]) [name] Primary Care Zendaya Groseclose: [name] Other Clinician: Referring Addalynne Golding: Treating Samanthamarie Ezzell/Extender: [name] in Treatment: 0 Height (in): 65 Weight (lbs): 175 Body Mass Index (BMI): 29.1 Nutrition Risk Screening Items Score Screening NUTRITION RISK SCREEN: I have an illness or condition that made me change the kind and/or amount of food I eat 2 Yes I eat fewer than two meals per day 0 No I eat few fruits and vegetables, or milk products 0 No I have three or more drinks of beer, liquor or wine almost every day 0 No I have tooth or mouth problems that make it hard for me to eat 0 No KAIMEN, BRUSS ([id number]) [phone] [id number] Page 5 of 5 I don't always have enough money to buy the food I need 0 No I eat alone most of the time 0 No I take three or more different prescribed or over-the-counter drugs a day 1 Yes Without wanting to, I have lost or gained 10 pounds in the last six months 0 No I am not always physically able to shop, cook and/or feed myself 0 No Nutrition Protocols Good Risk Protocol Moderate Risk Protocol 0 Provide education on nutrition High Risk Proctocol Risk Level: Moderate Risk Score: 3 Electronic Signature(s) Signed: 07/28/2023 4:42:48 PM By: [name] RN Entered By: [name] on 07/22/2023 09:48:10
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
## 2023-07-28 NOTE — Progress Notes (Signed)
LADANIAN, GUZOWSKI ([id number]) [id number] Page 1 of 11 Visit Report for 07/22/2023 Chief Complaint Document Details Patient Name: Date of Service: Joe Barry [facility] R. 07/22/2023 9:30 A M Medical Record Number: [id number] Patient Account Number: [id number] Date of Birth/Sex: Treating RN: 1933-11-17 (89 y.o. Joe Barry) Joe Barry Primary Care Provider: [name] Other Clinician: Referring Provider: Treating Provider/Extender: [name] in Treatment: 0 Information Obtained from: Patient Chief Complaint Bilateral Heel Ulcers Electronic Signature(s) Signed: 07/22/2023 10:21:57 AM By: [name] PA-C Entered By: [name] on 07/22/2023 10:21:57 -------------------------------------------------------------------------------- Debridement Details Patient Name: Date of Service: Joe Barry [name] 07/22/2023 9:30 A M Medical Record Number: [id number] Patient Account Number: [id number] Date of Birth/Sex: Treating RN: 12/25/1933 (89 y.o. Joe Barry) Joe Barry Primary Care Provider: [name] Other Clinician: Referring Provider: Treating Provider/Extender: [name] in Treatment: 0 Debridement Performed for Assessment: Wound #3 Right Calcaneus Performed By: Physician [name], PA-C Debridement Type: Debridement Level of Consciousness (Pre-procedure): Awake and Alert Pre-procedure Verification/Time Out Yes - 10:29 Taken: Start Time: 10:29 Pain Control: Lidocaine 4% T opical Solution Percent of Wound Bed Debrided: 100% T Area Debrided (cm): otal 1.32 Tissue and other material debrided: Viable, Non-Viable, Callus, Subcutaneous, Skin: Dermis , Skin: Epidermis Level: Skin/Subcutaneous Tissue Debridement Description: Excisional Instrument: Curette Bleeding: Minimum Hemostasis Achieved: Pressure End Time: 10:33 Procedural Pain: 0 Post Procedural PainIzora Gala ANGUEL, LEAMY ([id number]) [id number]  Page 2 of 11 Response to Treatment: Procedure was tolerated well Level of Consciousness (Post- Awake and Alert procedure): Post Debridement Measurements of Total Wound Length: (cm) 1.2 Stage: Unstageable/Unclassified Width: (cm) 1.4 Depth: (cm) 0.8 Volume: (cm) 1.056 Character of Wound/Ulcer Post Debridement: Improved Post Procedure Diagnosis Same as Pre-procedure Electronic Signature(s) Signed: 07/22/2023 11:25:01 AM By: [name] PA-C Signed: 07/28/2023 4:42:48 PM By: Joe Pax RN Entered By: Joe Barry on 07/22/2023 10:32:26 -------------------------------------------------------------------------------- Debridement Details Patient Name: Date of Service: Joe Barry [name] 07/22/2023 9:30 A M Medical Record Number: [id number] Patient Account Number: [id number] Date of Birth/Sex: Treating RN: April 27, 1934 (89 y.o. Joe Barry) Joe Barry Primary Care Provider: [name] Other Clinician: Referring Provider: Treating Provider/Extender: [name] in Treatment: 0 Debridement Performed for Assessment: Wound #4 Left Calcaneus Performed By: Physician [name], PA-C Debridement Type: Debridement Level of Consciousness (Pre-procedure): Awake and Alert Pre-procedure Verification/Time Out Yes - 10:29 Taken: Start Time: 10:29 Pain Control: Lidocaine 4% T opical Solution Percent of Wound Bed Debrided: 100% T Area Debrided (cm): otal 10.99 Tissue and other material debrided: Viable, Non-Viable, Callus, Subcutaneous, Skin: Dermis , Skin: Epidermis Level: Skin/Subcutaneous Tissue Debridement Description: Excisional Instrument: Curette Bleeding: Minimum Hemostasis Achieved: Pressure End Time: 10:33 Procedural Pain: 0 Post Procedural Pain: 0 Response to Treatment: Procedure was tolerated well Level of Consciousness (Post- Awake and Alert procedure): Post Debridement Measurements of Total Wound Length: (cm) 3.5 Stage: Unstageable/Unclassified Width: (cm)  4 Depth: (cm) 0.7 Volume: (cm) 7.697 Character of Wound/Ulcer Post Debridement: Improved Post Procedure Diagnosis Same as [name], [name] ([id number]) [id number] Page 3 of 11 Electronic Signature(s) Signed: 07/22/2023 11:25:01 AM By: [name] PA-C Signed: 07/28/2023 4:42:48 PM By: Joe Pax RN Entered By: Joe Barry on 07/22/2023 10:33:12 -------------------------------------------------------------------------------- HPI Details Patient Name: Date of Service: Joe Barry [name] 07/22/2023 9:30 A M Medical Record Number: [id number] Patient Account Number: [id number] Date of Birth/Sex: Treating RN: Aug 18, 1934 (89 y.o. [name] Primary Care Provider: [name] Other Clinician: Referring Provider: Treating Provider/Extender:  Barry, [employer], Onalee Hua Weeks in Treatment: 0 History of Present Illness HPI Description: 02-26-2023 upon evaluation today patient presents for initial inspection here in our clinic concerning issues that he has been having with a wound on his left anterior lower leg. This actually began on 01-14-2023 when the patient was getting out of the car and subsequently they were turning the wheelchair around to move around the door and got too close to the edge of the door and the corner at the bottom actually cut his leg. This because of his lymphedema has been a very hard wound to heal following. Fortunately there does not appear to be any signs of infection systemically which is good news. With that being said even locally I do not see any signs of infection at this point. I do believe that he is making fairly good progress all things considered although I think that we definitely need to clean some this out and get it moving in the right direction. Patient has been using Medihoney or rather his daughter has at home she is a Engineer, civil (consulting) she works in the ICU office seen her previously as well as a patient. Subsequently  after it was apparent that this was not getting better they did go on 7 June to [place] [facility] and at that point they actually put the patient on Augmentin which she started on 02-19-2023 and is still continuing that through current. His most recent hemoglobin A1c was 6.9 on 01-16-2023. He did have an x-ray done of the leg but this has not been released yet it still pending a reading. Currently could not tolerate the ABI screening but he seems to have good perfusion in regard to his leg I do not know that we go to gotten a lot of a good reading anyway when it comes to the ABIs even if we were able to do the screening due to the amount of lymphedema that he has with associated swelling. Patient does have a history of lymphedema, diabetes mellitus type 2, he is on oral hypoglycemic agents. He also has a history of hypertension and peripheral vascular disease although his last ABI check in 2018 was normal according to his daughter and he has seen vascular again at that time but has not had any recent ABIs performed. Readmission: 07-22-2023 upon evaluation today patient presents for readmission I previously was taking care of him with regard to wounds on his legs he is doing well with regard to his legs in fact are not even swelling is much as they were in the past. With that being said he unfortunately is having some issues here with his heels bilaterally where there is a pressure injury were not exactly sure where this came from. Fortunately there does not appear to be any signs of active infection locally or systemically which is great news. No fevers, chills, nausea, vomiting, or diarrhea. Electronic Signature(s) Signed: 07/22/2023 10:38:24 AM By: [name] PA-C Entered By: [name] on 07/22/2023 10:38:24 -------------------------------------------------------------------------------- Physical Exam Details Patient Name: Date of Service: TA [name] [name] 07/22/2023 9:30 A M Medical  Record Number: [id number] Patient Account Number: [id number] Date of Birth/Sex: Treating RN: 01/07/1934 ([address] y.o. Joe Barry) Zerion, Thell R ([id number]) [phone].pdf Page 4 of 11 Primary Care Provider: [name] Other Clinician: Referring Provider: Treating Provider/Extender: [name] in Treatment: 0 Constitutional sitting or standing blood pressure is within target range for patient.. pulse regular and within target range for patient.Marland Kitchen  respirations regular, non-labored and within target range for patient.Marland Kitchen temperature within target range for patient.. Well-nourished and well-hydrated in no acute distress. Eyes conjunctiva clear no eyelid edema noted. pupils equal round and reactive to light and accommodation. Ears, Nose, Mouth, and Throat no gross abnormality of ear auricles or external auditory canals. normal hearing noted during conversation. mucus membranes moist. Respiratory normal breathing without difficulty. Cardiovascular 2+ dorsalis pedis/posterior tibialis pulses. 2+ pitting edema of the bilateral lower extremities. Musculoskeletal normal gait and posture. no significant deformity or arthritic changes, no loss or range of motion, no clubbing. Psychiatric this patient is able to make decisions and demonstrates good insight into disease process. Alert and Oriented x 3. pleasant and cooperative. Notes Upon inspection patient's wound bed actually showed signs of good granulation and epithelization at this point. Fortunately I do not see any signs of active infection locally or systemically at this time. Fortunately I do not see any signs of active infection at this time which is good news. No fevers, chills, nausea, vomiting, or diarrhea. Electronic Signature(s) Signed: 07/22/2023 10:40:48 AM By: [name] PA-C Entered By: [name] on 07/22/2023  10:40:47 -------------------------------------------------------------------------------- Physician Orders Details Patient Name: Date of Service: Joe Barry [name] 07/22/2023 9:30 A M Medical Record Number: [id number] Patient Account Number: [id number] Date of Birth/Sex: Treating RN: 1934/07/21 (89 y.o. Joe Barry) Joe Barry Primary Care Provider: [name] Other Clinician: Referring Provider: Treating Provider/Extender: [name] in Treatment: 0 Verbal / Phone Orders: No Diagnosis Coding ICD-10 Coding Code Description L89.610 Pressure ulcer of right heel, unstageable L89.620 Pressure ulcer of left heel, unstageable I89.0 Lymphedema, not elsewhere classified E11.622 Type 2 diabetes mellitus with other skin ulcer Z79.84 Long term (current) use of oral hypoglycemic drugs I10 Essential (primary) hypertension I73.89 Other specified peripheral vascular diseases Follow-up Appointments Return Appointment in 1 week. RURY, NISBETT ([id number]) [id number] Page 5 of 11 Bathing/ Shower/ Hygiene May shower with wound dressing protected with water repellent cover or cast protector. Anesthetic (Use 'Patient Medications' Section for Anesthetic Order Entry) Lidocaine applied to wound bed Edema Control - Orders / Instructions Elevate, Exercise Daily and A void Standing for Long Periods of Time. Elevate legs to the level of the heart and pump ankles as often as possible Elevate leg(s) parallel to the floor when sitting. Off-Loading Turn and reposition every 2 hours Wound Treatment Wound #3 - Calcaneus Wound Laterality: Right Cleanser: Byram Ancillary Kit - 15 Day Supply (DME) (Generic) 3 x Per Week/30 Days Discharge Instructions: Use supplies as instructed; Kit contains: (15) Saline Bullets; (15) 3x3 Gauze; 15 pr Gloves Cleanser: Soap and Water 3 x Per Week/30 Days Discharge Instructions: Gently cleanse wound with antibacterial soap, rinse and  pat dry prior to dressing wounds Prim Dressing: Iodosorb 40 (g) (DME) (Generic) 3 x Per Week/30 Days ary Discharge Instructions: Apply IodoSorb to wound bed only as directed. Secondary Dressing: ABD Pad 5x9 (in/in) (DME) (Generic) 3 x Per Week/30 Days Discharge Instructions: Cover with ABD pad Secured With: Medipore T - 47M Medipore H Soft Cloth Surgical T ape ape, 2x2 (in/yd) (DME) (Generic) 3 x Per Week/30 Days Secured With: [employer] Sterile or Non-Sterile 6-ply 4.5x4 (yd/yd) (DME) (Generic) 3 x Per Week/30 Days Discharge Instructions: Apply Kerlix as directed Wound #4 - Calcaneus Wound Laterality: Left Cleanser: Byram Ancillary Kit - 15 Day Supply (DME) (Generic) 3 x Per Week/30 Days Discharge Instructions: Use supplies as instructed; Kit contains: (15) Saline Bullets; (15) 3x3 Gauze; 15 pr Gloves  Cleanser: Soap and Water 3 x Per Week/30 Days Discharge Instructions: Gently cleanse wound with antibacterial soap, rinse and pat dry prior to dressing wounds Prim Dressing: Iodosorb 40 (g) (DME) (Generic) 3 x Per Week/30 Days ary Discharge Instructions: Apply IodoSorb to wound bed only as directed. Secondary Dressing: ABD Pad 5x9 (in/in) (DME) (Generic) 3 x Per Week/30 Days Discharge Instructions: Cover with ABD pad Secured With: Medipore T - 17M Medipore H Soft Cloth Surgical T ape ape, 2x2 (in/yd) (DME) (Generic) 3 x Per Week/30 Days Secured With: Kerlix Roll Sterile or Non-Sterile 6-ply 4.5x4 (yd/yd) (DME) (Generic) 3 x Per Week/30 Days Discharge Instructions: Apply Kerlix as directed Electronic Signature(s) Signed: 07/22/2023 11:25:01 AM By: [name] PA-C Signed: 07/28/2023 4:42:48 PM By: Joe Pax RN Entered By: Joe Barry on 07/22/2023 10:37:05 -------------------------------------------------------------------------------- Problem List Details Patient Name: Date of Service: Joe Barry [name] 07/22/2023 9:30 A M Medical Record Number: [id number] Patient Account Number:  [id number] COLESYN, SHANGRAW ([id number]) [id number] Page 6 of 11 Date of Birth/Sex: Treating RN: 11-May-1934 (89 y.o. Joe Barry) Joe Barry Primary Care Provider: Other Clinician: [name] Referring Provider: Treating Provider/Extender: [name] in Treatment: 0 Active Problems ICD-10 Encounter Code Description Active Date MDM Diagnosis L89.610 Pressure ulcer of right heel, unstageable 07/22/2023 No Yes L89.620 Pressure ulcer of left heel, unstageable 07/22/2023 No Yes I89.0 Lymphedema, not elsewhere classified 07/22/2023 No Yes E11.622 Type 2 diabetes mellitus with other skin ulcer 07/22/2023 No Yes Z79.84 Long term (current) use of oral hypoglycemic drugs 07/22/2023 No Yes I10 Essential (primary) hypertension 07/22/2023 No Yes I73.89 Other specified peripheral vascular diseases 07/22/2023 No Yes Inactive Problems Resolved Problems Electronic Signature(s) Signed: 07/22/2023 10:21:37 AM By: [name] PA-C Entered By: [name] on 07/22/2023 10:21:37 -------------------------------------------------------------------------------- Progress Note Details Patient Name: Date of Service: Joe Barry [name] 07/22/2023 9:30 A M Medical Record Number: [id number] Patient Account Number: [id number] Date of Birth/Sex: Treating RN: Dec 31, 1933 (89 y.o. [name] Primary Care Provider: [name] Other Clinician: Referring Provider: Treating Provider/Extender: [name] in Treatment: 0 Subjective Chief Complaint Information obtained from Patient MURVIN, DUGGAN ([id number]) [id number] Page 7 of 11 Bilateral Heel Ulcers History of Present Illness (HPI) 02-26-2023 upon evaluation today patient presents for initial inspection here in our clinic concerning issues that he has been having with a wound on his left anterior lower leg. This actually began on 01-14-2023 when the patient was getting out  of the car and subsequently they were turning the wheelchair around to move around the door and got too close to the edge of the door and the corner at the bottom actually cut his leg. This because of his lymphedema has been a very hard wound to heal following. Fortunately there does not appear to be any signs of infection systemically which is good news. With that being said even locally I do not see any signs of infection at this point. I do believe that he is making fairly good progress all things considered although I think that we definitely need to clean some this out and get it moving in the right direction. Patient has been using Medihoney or rather his daughter has at home she is a Engineer, civil (consulting) she works in the ICU office seen her previously as well as a patient. Subsequently after it was apparent that this was not getting better they did go on 7 June to [place] [facility] and at that point they actually put  the patient on Augmentin which she started on 02-19-2023 and is still continuing that through current. His most recent hemoglobin A1c was 6.9 on 01-16-2023. He did have an x-ray done of the leg but this has not been released yet it still pending a reading. Currently could not tolerate the ABI screening but he seems to have good perfusion in regard to his leg I do not know that we go to gotten a lot of a good reading anyway when it comes to the ABIs even if we were able to do the screening due to the amount of lymphedema that he has with associated swelling. Patient does have a history of lymphedema, diabetes mellitus type 2, he is on oral hypoglycemic agents. He also has a history of hypertension and peripheral vascular disease although his last ABI check in 2018 was normal according to his daughter and he has seen vascular again at that time but has not had any recent ABIs performed. Readmission: 07-22-2023 upon evaluation today patient presents for readmission I previously was taking care of  him with regard to wounds on his legs he is doing well with regard to his legs in fact are not even swelling is much as they were in the past. With that being said he unfortunately is having some issues here with his heels bilaterally where there is a pressure injury were not exactly sure where this came from. Fortunately there does not appear to be any signs of active infection locally or systemically which is great news. No fevers, chills, nausea, vomiting, or diarrhea. Patient History Information obtained from Patient, Caregiver. Allergies No Known Drug Allergies Social History Former smoker - quit 1973, Alcohol Use - Rarely, Drug Use - No History, Caffeine Use - Daily. Medical History Eyes Patient has history of Cataracts Hematologic/Lymphatic Patient has history of Lymphedema Cardiovascular Patient has history of Hypertension, Peripheral Venous Disease Endocrine Patient has history of Type II Diabetes Objective Constitutional sitting or standing blood pressure is within target range for patient.. pulse regular and within target range for patient.Marland Kitchen respirations regular, non-labored and within target range for patient.Marland Kitchen temperature within target range for patient.. Well-nourished and well-hydrated in no acute distress. Vitals Time Taken: 9:43 AM, Height: 65 in, Source: Stated, Weight: 175 lbs, Source: Stated, BMI: 29.1, Temperature: 98.2 F, Pulse: 72 bpm, Respiratory Rate: 18 breaths/min, Blood Pressure: 136/71 mmHg. Eyes conjunctiva clear no eyelid edema noted. pupils equal round and reactive to light and accommodation. Ears, Nose, Mouth, and Throat no gross abnormality of ear auricles or external auditory canals. normal hearing noted during conversation. mucus membranes moist. Respiratory normal breathing without difficulty. Cardiovascular 2+ dorsalis pedis/posterior tibialis pulses. 2+ pitting edema of the bilateral lower extremities. Musculoskeletal normal gait and posture.  no significant deformity or arthritic changes, no loss or range of motion, no clubbing. Psychiatric this patient is able to make decisions and demonstrates good insight into disease process. Alert and Oriented x 3. pleasant and cooperative. General Notes: Upon inspection patient's wound bed actually showed signs of good granulation and epithelization at this point. Fortunately I do not see any signs of active infection locally or systemically at this time. Fortunately I do not see any signs of active infection at this time which is good news. No fevers, TALBOT, DILLMAN ([id number]) [id number] Page 8 of 11 chills, nausea, vomiting, or diarrhea. Integumentary (Hair, Skin) Wound #3 status is Open. Original cause of wound was Gradually Appeared. The date acquired was: 06/28/2023. The wound is located on the Right  Calcaneus. The wound measures 1.2cm length x 1.4cm width x 0.8cm depth; 1.319cm^2 area and 1.056cm^3 volume. There is Fat Layer (Subcutaneous Tissue) exposed. There is no tunneling or undermining noted. There is a medium amount of serosanguineous drainage noted. There is small (1-33%) pink granulation within the wound bed. There is a large (67-100%) amount of necrotic tissue within the wound bed including Eschar and Adherent Slough. Wound #4 status is Open. Original cause of wound was Gradually Appeared. The date acquired was: 06/28/2023. The wound is located on the Left Calcaneus. The wound measures 3.5cm length x 4cm width x 0.7cm depth; 10.996cm^2 area and 7.697cm^3 volume. There is Fat Layer (Subcutaneous Tissue) exposed. There is no tunneling or undermining noted. There is a medium amount of serosanguineous drainage noted. There is small (1-33%) pink granulation within the wound bed. There is a large (67-100%) amount of necrotic tissue within the wound bed including Eschar. Assessment Active Problems ICD-10 Pressure ulcer of right heel, unstageable Pressure  ulcer of left heel, unstageable Lymphedema, not elsewhere classified Type 2 diabetes mellitus with other skin ulcer Long term (current) use of oral hypoglycemic drugs Essential (primary) hypertension Other specified peripheral vascular diseases Procedures Wound #3 Pre-procedure diagnosis of Wound #3 is a Pressure Ulcer located on the Right Calcaneus . There was a Excisional Skin/Subcutaneous Tissue Debridement with a total area of 1.32 sq cm performed by [name], PA-C. With the following instrument(s): Curette to remove Viable and Non-Viable tissue/material. Material removed includes Callus, Subcutaneous Tissue, Skin: Dermis, and Skin: Epidermis after achieving pain control using Lidocaine 4% T opical Solution. No specimens were taken. A time out was conducted at 10:29, prior to the start of the procedure. A Minimum amount of bleeding was controlled with Pressure. The procedure was tolerated well with a pain level of 0 throughout and a pain level of 0 following the procedure. Post Debridement Measurements: 1.2cm length x 1.4cm width x 0.8cm depth; 1.056cm^3 volume. Post debridement Stage noted as Unstageable/Unclassified. Character of Wound/Ulcer Post Debridement is improved. Post procedure Diagnosis Wound #3: Same as Pre-Procedure Wound #4 Pre-procedure diagnosis of Wound #4 is a Pressure Ulcer located on the Left Calcaneus . There was a Excisional Skin/Subcutaneous Tissue Debridement with a total area of 10.99 sq cm performed by [name], PA-C. With the following instrument(s): Curette to remove Viable and Non-Viable tissue/material. Material removed includes Callus, Subcutaneous Tissue, Skin: Dermis, and Skin: Epidermis after achieving pain control using Lidocaine 4% T opical Solution. No specimens were taken. A time out was conducted at 10:29, prior to the start of the procedure. A Minimum amount of bleeding was controlled with Pressure. The procedure was tolerated well with a pain  level of 0 throughout and a pain level of 0 following the procedure. Post Debridement Measurements: 3.5cm length x 4cm width x 0.7cm depth; 7.697cm^3 volume. Post debridement Stage noted as Unstageable/Unclassified. Character of Wound/Ulcer Post Debridement is improved. Post procedure Diagnosis Wound #4: Same as Pre-Procedure Plan Follow-up Appointments: Return Appointment in 1 week. Bathing/ Shower/ Hygiene: May shower with wound dressing protected with water repellent cover or cast protector. Anesthetic (Use 'Patient Medications' Section for Anesthetic Order Entry): Lidocaine applied to wound bed Edema Control - Orders / Instructions: Elevate, Exercise Daily and Avoid Standing for Long Periods of Time. Elevate legs to the level of the heart and pump ankles as often as possible Elevate leg(s) parallel to the floor when sitting. Off-Loading: Turn and reposition every 2 hours WOUND #3: - Calcaneus Wound Laterality: Right Cleanser: Byram  Ancillary Kit - 15 Day Supply (DME) (Generic) 3 x Per Week/30 Days Discharge Instructions: Use supplies as instructed; Kit contains: (15) Saline Bullets; (15) 3x3 Gauze; 15 pr Gloves Cleanser: Soap and Water 3 x Per Week/30 Days Discharge Instructions: Gently cleanse wound with antibacterial soap, rinse and pat dry prior to dressing wounds Prim Dressing: Iodosorb 40 (g) (DME) (Generic) 3 x Per Week/30 Days ary Discharge Instructions: Apply IodoSorb to wound bed only as directed. Secondary Dressing: ABD Pad 5x9 (in/in) (DME) (Generic) 3 x Per Week/30 Days Discharge Instructions: Cover with ABD pad Secured With: Medipore T - 355M Medipore H Soft Cloth Surgical T ape ape, 2x2 (in/yd) (DME) (Generic) 3 x Per Week/30 Days Secured With: Kerlix Roll Sterile or Non-Sterile 6-ply 4.5x4 (yd/yd) (DME) (Generic) 3 x Per Week/30 Days Discharge Instructions: Apply Mariah Milling as directed NICKHOLAS, MOUND ([id number]) [id number] Page 9 of 11 WOUND  #4: - Calcaneus Wound Laterality: Left Cleanser: [employer] Ancillary Kit - 15 Day Supply (DME) (Generic) 3 x Per Week/30 Days Discharge Instructions: Use supplies as instructed; Kit contains: (15) Saline Bullets; (15) 3x3 Gauze; 15 pr Gloves Cleanser: Soap and Water 3 x Per Week/30 Days Discharge Instructions: Gently cleanse wound with antibacterial soap, rinse and pat dry prior to dressing wounds Prim Dressing: Iodosorb 40 (g) (DME) (Generic) 3 x Per Week/30 Days ary Discharge Instructions: Apply IodoSorb to wound bed only as directed. Secondary Dressing: ABD Pad 5x9 (in/in) (DME) (Generic) 3 x Per Week/30 Days Discharge Instructions: Cover with ABD pad Secured With: Medipore T - 355M Medipore H Soft Cloth Surgical T ape ape, 2x2 (in/yd) (DME) (Generic) 3 x Per Week/30 Days Secured With: [employer] Sterile or Non-Sterile 6-ply 4.5x4 (yd/yd) (DME) (Generic) 3 x Per Week/30 Days Discharge Instructions: Apply Kerlix as directed 1. Based on what I am seeing I am going to recommend that we actually initiate treatment with Iodoflex to the bilateral heels I think this is probably can to be the best way to go. 2. I am also can recommend the patient should continue to monitor for any signs of infection or worsening. His daughter keeps a close eye on things and if anything changes she will definitely let me know. 3. We did discuss offloading. With that being said we were not able to determine what exactly caused this issue to begin with he does float his heels when in bed and otherwise he really should have any pressure to the area. Nonetheless I am very pleased to hear that I am hopeful that this will go well without any complications going forward. We will see patient back for reevaluation in 1 week here in the clinic. If anything worsens or changes patient will contact our office for additional recommendations. Electronic Signature(s) Signed: 07/22/2023 10:42:06 AM By: [name] PA-C Entered By:  [name] on 07/22/2023 10:42:05 -------------------------------------------------------------------------------- ROS/PFSH Details Patient Name: Date of Service: Joe Barry [name] 07/22/2023 9:30 A M Medical Record Number: [id number] Patient Account Number: [id number] Date of Birth/Sex: Treating RN: 07-02-1934 (89 y.o. Joe Barry) Joe Barry Primary Care Provider: [name] Other Clinician: Referring Provider: Treating Provider/Extender: [name] in Treatment: 0 Information Obtained From Patient Caregiver Eyes Medical History: Positive for: Cataracts Hematologic/Lymphatic Medical History: Positive for: Lymphedema Cardiovascular Medical History: Positive for: Hypertension; Peripheral Venous Disease Endocrine Medical History: Positive for: Type II Diabetes Time with diabetes: 50 years Treated with: Oral agents Blood sugar tested every day: No JOMEL, PAPWORTH ([id number]) [id number] Page 10 of 11  HBO Extended History Items Eyes: Cataracts Immunizations Pneumococcal Vaccine: Received Pneumococcal Vaccination: No Implantable Devices None Family and Social History Former smoker - quit 1973; Alcohol Use: Rarely; Drug Use: No History; Caffeine Use: Daily Electronic Signature(s) Signed: 07/22/2023 11:25:01 AM By: [name] PA-C Signed: 07/28/2023 4:42:48 PM By: Joe Pax RN Entered By: Joe Barry on 07/22/2023 09:45:19 -------------------------------------------------------------------------------- SuperBill Details Patient Name: Date of Service: Joe Barry [name] 07/22/2023 Medical Record Number: [id number] Patient Account Number: [id number] Date of Birth/Sex: Treating RN: 02/13/1934 (89 y.o. Joe Barry) Joe Barry Primary Care Provider: [name] Other Clinician: Referring Provider: Treating Provider/Extender: [name] in Treatment: 0 Diagnosis Coding ICD-10 Codes Code Description L89.610  Pressure ulcer of right heel, unstageable L89.620 Pressure ulcer of left heel, unstageable I89.0 Lymphedema, not elsewhere classified E11.622 Type 2 diabetes mellitus with other skin ulcer Z79.84 Long term (current) use of oral hypoglycemic drugs I10 Essential (primary) hypertension I73.89 Other specified peripheral vascular diseases Facility Procedures : CPT4 Code: [id number] Description: 11042 - DEB SUBQ TISSUE 20 SQ CM/< ICD-10 Diagnosis Description L89.610 Pressure ulcer of right heel, unstageable L89.620 Pressure ulcer of left heel, unstageable Modifier: Quantity: 1 Physician Procedures : CPT4 Code Description Modifier [id number] 99214 - WC PHYS LEVEL 4 - EST PT 25 ICD-10 Diagnosis Description L89.610 Pressure ulcer of right heel, unstageable L89.620 Pressure ulcer of left heel, JABARI, JOUETT ([id number])  [id number] I89.0 Lymphedema, not elsewhere classified E11.622 Type 2 diabetes mellitus with other skin ulcer Quantity: 1 17.pdf Page 11 of 11 : [id number] 11042 - WC PHYS SUBQ TISS 20 SQ CM 1 ICD-10 Diagnosis Description L89.610 Pressure ulcer of right heel, unstageable L89.620 Pressure ulcer of left heel, unstageable Quantity: Electronic Signature(s) Signed: 07/22/2023 10:42:22 AM By: [name] PA-C Entered By: [name] on 07/22/2023 10:42:22

## 2023-07-29 ENCOUNTER — Encounter: Payer: Medicare PPO | Admitting: Physician Assistant

## 2023-07-29 DIAGNOSIS — E11622 Type 2 diabetes mellitus with other skin ulcer: Secondary | ICD-10-CM | POA: Diagnosis not present

## 2023-07-29 DIAGNOSIS — L89610 Pressure ulcer of right heel, unstageable: Secondary | ICD-10-CM | POA: Diagnosis not present

## 2023-07-29 DIAGNOSIS — E1151 Type 2 diabetes mellitus with diabetic peripheral angiopathy without gangrene: Secondary | ICD-10-CM | POA: Diagnosis not present

## 2023-07-29 DIAGNOSIS — E11621 Type 2 diabetes mellitus with foot ulcer: Secondary | ICD-10-CM | POA: Diagnosis not present

## 2023-07-29 DIAGNOSIS — I10 Essential (primary) hypertension: Secondary | ICD-10-CM | POA: Diagnosis not present

## 2023-07-29 DIAGNOSIS — I890 Lymphedema, not elsewhere classified: Secondary | ICD-10-CM | POA: Diagnosis not present

## 2023-07-29 DIAGNOSIS — L89620 Pressure ulcer of left heel, unstageable: Secondary | ICD-10-CM | POA: Diagnosis not present

## 2023-07-29 DIAGNOSIS — S81812A Laceration without foreign body, left lower leg, initial encounter: Secondary | ICD-10-CM | POA: Diagnosis not present

## 2023-07-29 DIAGNOSIS — L97822 Non-pressure chronic ulcer of other part of left lower leg with fat layer exposed: Secondary | ICD-10-CM | POA: Diagnosis not present

## 2023-07-29 DIAGNOSIS — Z7984 Long term (current) use of oral hypoglycemic drugs: Secondary | ICD-10-CM | POA: Diagnosis not present

## 2023-07-29 NOTE — Progress Notes (Addendum)
Joe, [name] ([id number]) [id number] Page 1 of 11 Visit Report for 07/29/2023 Arrival Information Details Patient Name: Date of Service: Joe Barry [facility] R. 07/29/2023 3:15 PM Medical Record Number: [id number] Patient Account Number: [id number] Date of Birth/Sex: Treating RN: 05-Feb-1934 (89 y.o. Joe Barry) [name] Primary Care Joe Barry: [name] Other Clinician: Referring Jimy Gates: Treating Angeletta Goelz/Extender: Kallie Edward in Treatment: 1 Visit Information History Since Last Visit Added or deleted any medications: No Patient Arrived: Wheel Chair Any new allergies or adverse reactions: No Arrival Time: 15:27 Had a fall or experienced change in No Accompanied By: daughter activities of daily living that may affect Transfer Assistance: None risk of falls: Patient Identification Verified: Yes Signs or symptoms of abuse/neglect since last visito No Secondary Verification Process Completed: Yes Hospitalized since last visit: No Patient Requires Transmission-Based Precautions: No Implantable device outside of the clinic excluding No Patient Has Alerts: Yes cellular tissue based products placed in the center Patient Alerts: DIABETIC since last visit: Has Dressing in Place as Prescribed: Yes Pain Present Now: Yes Electronic Signature(s) Signed: 08/02/2023 8:25:20 AM By: [name] RN Entered By: [name] on 07/29/2023 12:28:14 -------------------------------------------------------------------------------- Clinic Level of Care Assessment Details Patient Name: Date of Service: Joe Barry [place] R. 07/29/2023 3:15 PM Medical Record Number: [id number] Patient Account Number: [id number] Date of Birth/Sex: Treating RN: [birth year] (89 y.o. Joe Barry) [name] Primary Care Joe Barry: [name] Other Clinician: Referring [name]: Treating [name]/Extender: Kallie Edward in Treatment: 1 Clinic Level of Care Assessment  Items TOOL 4 Quantity Score X- 1 0 Use when only an EandM is performed on FOLLOW-UP visit ASSESSMENTS - Nursing Assessment / Reassessment X- 1 10 Reassessment of Co-morbidities (includes updates in patient status) X- 1 5 Reassessment of Adherence to Treatment Plan Joe, Barry ([id number]) [phone].pdf Page 2 of 11 ASSESSMENTS - Wound and Skin A ssessment / Reassessment []  - Simple Wound Assessment / Reassessment - one wound 0 X- 2 5 Complex Wound Assessment / Reassessment - multiple wounds []  - 0 Dermatologic / Skin Assessment (not related to wound area) ASSESSMENTS - Focused Assessment []  - 0 Circumferential Edema Measurements - multi extremities []  - 0 Nutritional Assessment / Counseling / Intervention []  - 0 Lower Extremity Assessment (monofilament, tuning fork, pulses) []  - 0 Peripheral Arterial Disease Assessment (using hand held doppler) ASSESSMENTS - Ostomy and/or Continence Assessment and Care []  - 0 Incontinence Assessment and Management []  - 0 Ostomy Care Assessment and Management (repouching, etc.) PROCESS - Coordination of Care X - Simple Patient / Family Education for ongoing care 1 15 []  - 0 Complex (extensive) Patient / Family Education for ongoing care []  - 0 Staff obtains Chiropractor, Records, T Results / Process Orders est []  - 0 Staff telephones HHA, Nursing Homes / Clarify orders / etc []  - 0 Routine Transfer to another Facility (non-emergent condition) []  - 0 Routine Hospital Admission (non-emergent condition) []  - 0 New Admissions / Manufacturing engineer / Ordering NPWT Apligraf, etc. , []  - 0 Emergency Hospital Admission (emergent condition) X- 1 10 Simple Discharge Coordination []  - 0 Complex (extensive) Discharge Coordination PROCESS - Special Needs []  - 0 Pediatric / Minor Patient Management []  - 0 Isolation Patient Management []  - 0 Hearing / Language / Visual special needs []  - 0 Assessment of Community  assistance (transportation, D/C planning, etc.) []  - 0 Additional assistance / Altered mentation []  - 0 Support Surface(s) Assessment (bed, cushion, seat, etc.) INTERVENTIONS - Wound Cleansing /  Measurement []  - 0 Simple Wound Cleansing - one wound X- 2 5 Complex Wound Cleansing - multiple wounds X- 1 5 Wound Imaging (photographs - any number of wounds) []  - 0 Wound Tracing (instead of photographs) []  - 0 Simple Wound Measurement - one wound X- 2 5 Complex Wound Measurement - multiple wounds INTERVENTIONS - Wound Dressings X - Small Wound Dressing one or multiple wounds 2 10 []  - 0 Medium Wound Dressing one or multiple wounds []  - 0 Large Wound Dressing one or multiple wounds []  - 0 Application of Medications - topical []  - 0 Application of Medications - injection INTERVENTIONS - Miscellaneous []  - 0 External ear exam LENA, Barry ([id number]) [id number] Page 3 of 11 []  - 0 Specimen Collection (cultures, biopsies, blood, body fluids, etc.) []  - 0 Specimen(s) / Culture(s) sent or taken to Lab for analysis []  - 0 Patient Transfer (multiple staff / Michiel Sites Lift / Similar devices) []  - 0 Simple Staple / Suture removal (25 or less) []  - 0 Complex Staple / Suture removal (26 or more) []  - 0 Hypo / Hyperglycemic Management (close monitor of Blood Glucose) []  - 0 Ankle / Brachial Index (ABI) - do not check if billed separately X- 1 5 Vital Signs Has the patient been seen at the hospital within the last three years: Yes Total Score: 100 Level Of Care: New/Established - Level 3 Electronic Signature(s) Signed: 08/02/2023 8:25:20 AM By: [name] RN Entered By: [name] on 07/29/2023 13:17:10 -------------------------------------------------------------------------------- Encounter Discharge Information Details Patient Name: Date of Service: Joe Barry [name] 07/29/2023 3:15 PM Medical Record Number: [id number] Patient Account Number:  [id number] Date of Birth/Sex: Treating RN: [birth year] (89 y.o. Joe Barry Primary Care Kiel Cockerell: [name] Other Clinician: Referring Ezella Kell: Treating Dannie Hattabaugh/Extender: Kallie Edward in Treatment: 1 Encounter Discharge Information Items Discharge Condition: Stable Ambulatory Status: Wheelchair Discharge Destination: Home Transportation: Private Auto Accompanied By: daughter Schedule Follow-up Appointment: Yes Clinical Summary of Care: Electronic Signature(s) Signed: 07/29/2023 4:13:12 PM By: [name] RN Entered By: [name] on 07/29/2023 13:13:12 -------------------------------------------------------------------------------- Lower Extremity Assessment Details Patient Name: Date of Service: Joe Barry [name] 07/29/2023 3:15 PM GUALBERTO, GANE ([id number]) [phone].pdf Page 4 of 11 Medical Record Number: [id number] Patient Account Number: [id number] Date of Birth/Sex: Treating RN: 15-Apr-1934 (89 y.o. Joe Barry) [name] Primary Care Ansley Stanwood: [name] Other Clinician: Referring Madden Piazza: Treating Neve Branscomb/Extender: Kallie Edward in Treatment: 1 Edema Assessment Assessed: [Left: No] [Right: No] Edema: [Left: Yes] [Right: Yes] Calf Left: Right: Point of Measurement: 35 cm From Medial Instep 39 cm 39 cm Ankle Left: Right: Point of Measurement: 10 cm From Medial Instep 30 cm 29 cm Knee To Floor Left: Right: From Medial Instep 47 cm 47 cm Vascular Assessment Pulses: Dorsalis Pedis Palpable: [Left:Yes] [Right:Yes] Extremity colors, hair growth, and conditions: Extremity Color: [Left:Hyperpigmented] [Right:Hyperpigmented] Hair Growth on Extremity: [Left:No] [Right:No] Temperature of Extremity: [Left:Warm] [Right:Warm] Capillary Refill: [Left:< 3 seconds] [Right:< 3 seconds] Dependent Rubor: [Left:No] [Right:No] Blanched when Elevated: [Left:No No] [Right:No No] Toe Nail Assessment Left:  Right: Thick: Yes Yes Discolored: Yes Yes Deformed: Yes Yes Improper Length and Hygiene: Yes Yes Electronic Signature(s) Signed: 08/02/2023 8:25:20 AM By: [name] RN Entered By: [name] on 07/29/2023 12:41:36 -------------------------------------------------------------------------------- Multi Wound Chart Details Patient Name: Date of Service: Joe Barry [name] 07/29/2023 3:15 PM Medical Record Number: [id number] Patient Account Number: [id number] Date of Birth/Sex: Treating RN: 02/28/1934 (89 y.o.  Joe Barry Primary Care Sumner Kirchman: [name] Other Clinician: Referring Azana Kiesler: Treating Chancey Cullinane/Extender: Kallie Edward in Treatment: 1 Vital Signs Height(in): 65 Pulse(bpm): [address] R ([id number]) [phone].pdf Page 5 of 11 Weight(lbs): 175 Blood Pressure(mmHg): 150/71 Body Mass Index(BMI): 29.1 Temperature(F): 98 Respiratory Rate(breaths/min): 18 [3:Photos:] [N/A:N/A] Right Calcaneus Left Calcaneus N/A Wound Location: Gradually Appeared Gradually Appeared N/A Wounding Event: Pressure Ulcer Pressure Ulcer N/A Primary Etiology: Cataracts, Lymphedema, Cataracts, Lymphedema, N/A Comorbid History: Hypertension, Peripheral Venous Hypertension, Peripheral Venous Disease, Type II Diabetes Disease, Type II Diabetes 06/28/2023 06/28/2023 N/A Date Acquired: 1 1 N/A Weeks of Treatment: Open Open N/A Wound Status: No No N/A Wound Recurrence: 1x1x0.8 3x3.3x0.7 N/A Measurements L x W x D (cm) 0.785 7.775 N/A A (cm) : rea 0.628 5.443 N/A Volume (cm) : 40.50% 29.30% N/A % Reduction in A rea: 40.50% 29.30% N/A % Reduction in Volume: Unstageable/Unclassified Unstageable/Unclassified N/A Classification: Medium Medium N/A Exudate A mount: Serosanguineous Serosanguineous N/A Exudate Type: red, brown red, brown N/A Exudate Color: Small (1-33%) Small (1-33%) N/A Granulation A mount: Pink Pink  N/A Granulation Quality: Large (67-100%) Large (67-100%) N/A Necrotic A mount: Eschar, Adherent Slough Eschar N/A Necrotic Tissue: Fat Layer (Subcutaneous Tissue): Yes Fat Layer (Subcutaneous Tissue): Yes N/A Exposed Structures: Fascia: No Fascia: No Tendon: No Tendon: No Muscle: No Muscle: No Joint: No Joint: No Bone: No Bone: No None None N/A Epithelialization: Treatment Notes Electronic Signature(s) Signed: 08/02/2023 8:25:20 AM By: [name] RN Entered By: [name] on 07/29/2023 12:41:43 -------------------------------------------------------------------------------- Multi-Disciplinary Care Plan Details Patient Name: Date of Service: Joe Barry [name] 07/29/2023 3:15 PM Medical Record Number: [id number] Patient Account Number: [id number] Date of Birth/Sex: Treating RN: 1934-02-25 (89 y.o. Joe Barry Primary Care Dezeray Puccio: [name] Other Clinician: Referring Kate Larock: Treating Katty Fretwell/Extender: Kallie Edward in Treatment: 1 BILL, WIED ([id number]) [phone].pdf Page 6 of 11 Active Inactive Abuse / Safety / Falls / Self Care Management Nursing Diagnoses: Potential for falls Goals: Patient will not experience any injury related to fire Date Initiated: 07/22/2023 Target Resolution Date: 08/21/2023 Goal Status: Active Interventions: Assess fall risk on admission and as needed Assess: immobility, friction, shearing, incontinence upon admission and as needed Assess impairment of mobility on admission and as needed per policy Assess self care needs on admission and as needed Notes: Necrotic Tissue Nursing Diagnoses: Impaired tissue integrity related to necrotic/devitalized tissue Goals: Necrotic/devitalized tissue will be minimized in the wound bed Date Initiated: 07/22/2023 Target Resolution Date: 08/21/2023 Goal Status: Active Interventions: Assess patient pain level pre-, during and post procedure  and prior to discharge Provide education on necrotic tissue and debridement process Notes: Nutrition Nursing Diagnoses: Impaired glucose control: actual or potential Goals: Patient/caregiver verbalizes understanding of need to maintain therapeutic glucose control per primary care physician Date Initiated: 07/22/2023 Target Resolution Date: 08/21/2023 Goal Status: Active Interventions: Assess HgA1c results as ordered upon admission and as needed Assess patient nutrition upon admission and as needed per policy Notes: Wound/Skin Impairment Nursing Diagnoses: Knowledge deficit related to ulceration/compromised skin integrity Goals: Patient/caregiver will verbalize understanding of skin care regimen Date Initiated: 07/22/2023 Target Resolution Date: 08/21/2023 Goal Status: Active Ulcer/skin breakdown will have a volume reduction of 30% by week 4 Date Initiated: 07/22/2023 Target Resolution Date: 08/21/2023 Goal Status: Active Ulcer/skin breakdown will have a volume reduction of 50% by week 8 Date Initiated: 07/22/2023 Target Resolution Date: 09/21/2023 Goal Status: Active Ulcer/skin breakdown will have a volume reduction of 80% by week 12  Date Initiated: 07/22/2023 Target Resolution Date: 10/22/2023 Goal Status: Active Ulcer/skin breakdown will heal within 14 weeks Date Initiated: 07/22/2023 Target Resolution Date: 11/19/2023 Goal Status: Active Interventions: Assess patient/caregiver ability to obtain necessary supplies Assess patient/caregiver ability to perform ulcer/skin care regimen upon admission and as needed YUUKI, SMIGIELSKI ([id number]) [id number] Page 7 of 11 Assess ulceration(s) every visit Notes: Electronic Signature(s) Signed: 08/02/2023 8:25:20 AM By: [name] RN Entered By: [name] on 07/29/2023 12:42:11 -------------------------------------------------------------------------------- Pain Assessment Details Patient Name: Date of  Service: Joe Barry [facility] R. 07/29/2023 3:15 PM Medical Record Number: [id number] Patient Account Number: [id number] Date of Birth/Sex: Treating RN: 10-10-1933 (89 y.o. Joe Barry) [name] Primary Care Kendrick Haapala: [name] Other Clinician: Referring Noralyn Karim: Treating Calandria Mullings/Extender: Kallie Edward in Treatment: 1 Active Problems Location of Pain Severity and Description of Pain Patient Has Paino Yes Site Locations With Dressing Change: Yes Duration of the Pain. Constant / Intermittento Intermittent How Long Does it Lasto Hours: Minutes: 15 Rate the pain. Current Pain Level: 5 Worst Pain Level: 8 Least Pain Level: 0 Tolerable Pain Level: 5 Character of Pain Describe the Pain: Aching Pain Management and Medication Current Pain Management: Medication: Yes Cold Application: No Rest: Yes Massage: No Activity: No T.E.N.S.: No Heat Application: No Leg drop or elevation: No Is the Current Pain Management Adequate: Inadequate How does your wound impact your activities of daily livingo Sleep: Yes Bathing: No Appetite: Yes Relationship With Others: No Bladder Continence: No Emotions: No Bowel Continence: No Work: No Toileting: No Drive: No Dressing: No Hobbies: No Electronic Signature(sCARVELL, LAMBERG ([id number]) [id number] Page 8 of 11 Signed: 08/02/2023 8:25:20 AM By: [name] RN Entered By: [name] on 07/29/2023 12:30:31 -------------------------------------------------------------------------------- Patient/Caregiver Education Details Patient Name: Date of Service: Delynn Flavin R. 11/14/2024andnbsp3:15 PM Medical Record Number: [id number] Patient Account Number: [id number] Date of Birth/Gender: Treating RN: 01-Jun-1934 (89 y.o. Joe Barry) [name] Primary Care Physician: [name] Other Clinician: Referring Physician: Treating Physician/Extender: Kallie Edward in Treatment: 1 Education  Assessment Education Provided To: Patient Education Topics Provided Wound/Skin Impairment: Handouts: Caring for Your Ulcer Methods: Explain/Verbal Responses: State content correctly Electronic Signature(s) Signed: 08/02/2023 8:25:20 AM By: [name] RN Entered By: [name] on 07/29/2023 12:42:21 -------------------------------------------------------------------------------- Wound Assessment Details Patient Name: Date of Service: Joe Barry [name] 07/29/2023 3:15 PM Medical Record Number: [id number] Patient Account Number: [id number] Date of Birth/Sex: Treating RN: 1933/12/27 (89 y.o. Joe Barry Primary Care Krystyne Tewksbury: [name] Other Clinician: Referring Avigail Pilling: Treating Zeb Rawl/Extender: Kallie Edward in Treatment: 1 Wound Status Wound Number: 3 Primary Pressure Ulcer Etiology: Wound Location: Right Calcaneus Wound Open Wounding Event: Gradually Appeared Status: Date Acquired: 06/28/2023 Comorbid Cataracts, Lymphedema, Hypertension, Peripheral Venous Weeks Of Treatment: 1 History: Disease, Type II Diabetes Clustered Wound: No JOB, SAVARD ([id number]) [id number] Page 9 of 11 Photos Wound Measurements Length: (cm) 1 Width: (cm) 1 Depth: (cm) 0.8 Area: (cm) 0.785 Volume: (cm) 0.628 % Reduction in Area: 40.5% % Reduction in Volume: 40.5% Epithelialization: None Tunneling: No Undermining: No Wound Description Classification: Unstageable/Unclassified Exudate Amount: Medium Exudate Type: Serosanguineous Exudate Color: red, brown Foul Odor After Cleansing: No Slough/Fibrino Yes Wound Bed Granulation Amount: Small (1-33%) Exposed Structure Granulation Quality: Pink Fascia Exposed: No Necrotic Amount: Large (67-100%) Fat Layer (Subcutaneous Tissue) Exposed: Yes Necrotic Quality: Eschar, Adherent Slough Tendon Exposed: No Muscle Exposed: No Joint Exposed: No Bone Exposed: No Treatment Notes Wound  #3 (Calcaneus)  Wound Laterality: Right Cleanser Byram Ancillary Kit - 15 Day Supply Discharge Instruction: Use supplies as instructed; Kit contains: (15) Saline Bullets; (15) 3x3 Gauze; 15 pr Gloves Soap and Water Discharge Instruction: Gently cleanse wound with antibacterial soap, rinse and pat dry prior to dressing wounds Peri-Wound Care Topical Primary Dressing Gauze Discharge Instruction: moistened with Dakins Solution Secondary Dressing ABD Pad 5x9 (in/in) Discharge Instruction: Cover with ABD pad Secured With Medipore T - 282M Medipore H Soft Cloth Surgical T ape ape, 2x2 (in/yd) Kerlix Roll Sterile or Non-Sterile 6-ply 4.5x4 (yd/yd) Discharge Instruction: Apply Kerlix as directed Compression Wrap Compression Stockings Add-Ons Electronic Signature(s) Signed: 08/02/2023 8:25:20 AM By: [name] RN Entered By: [name] on 07/29/2023 12:38:07 [name] ([id number]) [id number] Page 10 of 11 -------------------------------------------------------------------------------- Wound Assessment Details Patient Name: Date of Service: Joe Barry [facility] R. 07/29/2023 3:15 PM Medical Record Number: [id number] Patient Account Number: [id number] Date of Birth/Sex: Treating RN: [birth year] (89 y.o. Joe Barry) [name] Primary Care Tahj Lindseth: [name] Other Clinician: Referring Shayan Bramhall: Treating Caramia Boutin/Extender: Kallie Edward in Treatment: 1 Wound Status Wound Number: 4 Primary Pressure Ulcer Etiology: Wound Location: Left Calcaneus Wound Open Wounding Event: Gradually Appeared Status: Date Acquired: 06/28/2023 Comorbid Cataracts, Lymphedema, Hypertension, Peripheral Venous Weeks Of Treatment: 1 History: Disease, Type II Diabetes Clustered Wound: No Photos Wound Measurements Length: (cm) 3 Width: (cm) 3.3 Depth: (cm) 0.7 Area: (cm) 7.775 Volume: (cm) 5.443 % Reduction in Area: 29.3% % Reduction in Volume:  29.3% Epithelialization: None Tunneling: No Undermining: No Wound Description Classification: Unstageable/Unclassified Exudate Amount: Medium Exudate Type: Serosanguineous Exudate Color: red, brown Foul Odor After Cleansing: No Slough/Fibrino Yes Wound Bed Granulation Amount: Small (1-33%) Exposed Structure Granulation Quality: Pink Fascia Exposed: No Necrotic Amount: Large (67-100%) Fat Layer (Subcutaneous Tissue) Exposed: Yes Necrotic Quality: Eschar Tendon Exposed: No Muscle Exposed: No Joint Exposed: No Bone Exposed: No Treatment Notes Wound #4 (Calcaneus) Wound Laterality: Left Cleanser Byram Ancillary Kit - 15 Day Supply Discharge Instruction: Use supplies as instructed; Kit contains: (15) Saline Bullets; (15) 3x3 Gauze; 15 pr [address] JOHUA, ESPEJEL R ([id number]) [phone].pdf Page 11 of 11 Discharge Instruction: Gently cleanse wound with antibacterial soap, rinse and pat dry prior to dressing wounds Peri-Wound Care Topical Primary Dressing Gauze Discharge Instruction: moistened with Dakins Solution Secondary Dressing ABD Pad 5x9 (in/in) Discharge Instruction: Cover with ABD pad Secured With Medipore T - 282M Medipore H Soft Cloth Surgical T ape ape, 2x2 (in/yd) Kerlix Roll Sterile or Non-Sterile 6-ply 4.5x4 (yd/yd) Discharge Instruction: Apply Kerlix as directed Compression Wrap Compression Stockings Add-Ons Electronic Signature(s) Signed: 08/02/2023 8:25:20 AM By: [name] RN Entered By: [name] on 07/29/2023 12:38:31 -------------------------------------------------------------------------------- Vitals Details Patient Name: Date of Service: Joe Barry [name] 07/29/2023 3:15 PM Medical Record Number: [id number] Patient Account Number: [id number] Date of Birth/Sex: Treating RN: 07-May-1934 (89 y.o. Joe Barry) [name] Primary Care Marla Pouliot: [name] Other Clinician: Referring Tanvir Hipple: Treating  Teneshia Hedeen/Extender: Kallie Edward in Treatment: 1 Vital Signs Time Taken: 15:29 Temperature (F): 98 Height (in): 65 Pulse (bpm): 85 Weight (lbs): 175 Respiratory Rate (breaths/min): 18 Body Mass Index (BMI): 29.1 Blood Pressure (mmHg): 150/71 Reference Range: 80 - 120 mg / dl Electronic Signature(s) Signed: 08/02/2023 8:25:20 AM By: [name] RN Entered By: [name] on 07/29/2023 12:29:28

## 2023-07-29 NOTE — Progress Notes (Addendum)
Joe Barry, Joe Barry ([id number]) [id number] Page 1 of 7 Visit Report for 07/29/2023 Chief Complaint Document Details Patient Name: Date of Service: Joe Barry [facility] R. 07/29/2023 3:15 PM Medical Record Number: [id number] Patient Account Number: [id number] Date of Birth/Sex: Treating RN: December 23, 1933 (89 y.o. [name]) [name] Primary Care Provider: [name] Other Clinician: Referring Provider: Treating Provider/Extender: Kallie Edward in Treatment: 1 Information Obtained from: Patient Chief Complaint Bilateral Heel Ulcers Electronic Signature(s) Signed: 07/29/2023 3:24:57 PM By: [name] PA-C Entered By: [name] on 07/29/2023 12:24:57 -------------------------------------------------------------------------------- HPI Details Patient Name: Date of Service: Joe Barry [name] 07/29/2023 3:15 PM Medical Record Number: [id number] Patient Account Number: [id number] Date of Birth/Sex: Treating RN: 1934-04-12 (89 y.o. [name] Primary Care Provider: [name] Other Clinician: Referring Provider: Treating Provider/Extender: Kallie Edward in Treatment: 1 History of Present Illness HPI Description: 02-26-2023 upon evaluation today patient presents for initial inspection here in our clinic concerning issues that he has been having with a wound on his left anterior lower leg. This actually began on 01-14-2023 when the patient was getting out of the car and subsequently they were turning the wheelchair around to move around the door and got too close to the edge of the door and the corner at the bottom actually cut his leg. This because of his lymphedema has been a very hard wound to heal following. Fortunately there does not appear to be any signs of infection systemically which is good news. With that being said even locally I do not see any signs of infection at this point. I do believe that he is making fairly good  progress all things considered although I think that we definitely need to clean some this out and get it moving in the right direction. Patient has been using Medihoney or rather his daughter has at home she is a Engineer, civil (consulting) she works in the ICU office seen her previously as well as a patient. Subsequently after it was apparent that this was not getting better they did go on 7 June to [place] [facility] and at that point they actually put the patient on Augmentin which she started on 02-19-2023 and is still continuing that through current. His most recent hemoglobin A1c was 6.9 on 01-16-2023. He did have an x-ray done of the leg but this has not been released yet it still pending a reading. Currently could not tolerate the ABI screening but he seems to have good perfusion in regard to his leg I do not know that we go to gotten a lot of a good reading anyway when it comes to the ABIs even if we were able to do the screening due to the amount of lymphedema that he has with associated swelling. Patient does have a history of lymphedema, diabetes mellitus type 2, he is on oral hypoglycemic agents. He also has a history of hypertension and peripheral vascular disease although his last ABI check in 2018 was normal according to his daughter and he has seen vascular again at that time but has not had any recent ABIs performed. Joe Barry, Joe Barry ([id number]) [id number] Page 2 of 7 Readmission: 07-22-2023 upon evaluation today patient presents for readmission I previously was taking care of him with regard to wounds on his legs he is doing well with regard to his legs in fact are not even swelling is much as they were in the past. With that being said he unfortunately is having  some issues here with his heels bilaterally where there is a pressure injury were not exactly sure where this came from. Fortunately there does not appear to be any signs of active infection locally or systemically  which is great news. No fevers, chills, nausea, vomiting, or diarrhea. 07-29-2023 upon evaluation today patient appears to be doing decently well currently in regard to his wound. Has been tolerating the dressing changes without complication. Fortunately I do not see any signs of active infection at this time which is great news. With that being said there is a lot of necrotic tissue and I think that this is what is causing some of the odor on the left heel. We need to get this cleaned up as quickly as possible. Electronic Signature(s) Signed: 07/29/2023 3:57:45 PM By: [name] PA-C Entered By: [name] on 07/29/2023 12:57:45 -------------------------------------------------------------------------------- Physical Exam Details Patient Name: Date of Service: Joe Barry [place] R. 07/29/2023 3:15 PM Medical Record Number: [id number] Patient Account Number: [id number] Date of Birth/Sex: Treating RN: 04-Jan-1934 (89 y.o. [name] Primary Care Provider: [name] Other Clinician: Referring Provider: Treating Provider/Extender: Kallie Edward in Treatment: 1 Constitutional Well-nourished and well-hydrated in no acute distress. Respiratory normal breathing without difficulty. Psychiatric this patient is able to make decisions and demonstrates good insight into disease process. Alert and Oriented x 3. pleasant and cooperative. Notes Upon inspection patient's wound bed actually showed signs of good granulation and epithelization at this point. Fortunately I do not see any signs of worsening a lot of the necrotic tissues cleaning out but he does have a quite a bit of an odor. I do not see any signs of obvious cellulitis right now but that something that we will need to keep a close eye on going forward. Electronic Signature(s) Signed: 07/29/2023 3:58:08 PM By: [name] PA-C Entered By: [name] on 07/29/2023  12:58:08 -------------------------------------------------------------------------------- Physician Orders Details Patient Name: Date of Service: Joe Barry [name] 07/29/2023 3:15 PM Medical Record Number: [id number] Patient Account Number: [id number] Date of Birth/Sex: Treating RN: 1933-10-27 (89 y.o. [name] Primary Care Provider: [name] Other Clinician: WILIAN, EBANKS ([id number]) [id number] Page 3 of 7 Referring Provider: Treating Provider/Extender: Kallie Edward in Treatment: 1 The following information was scribed by: [name] The information was scribed for: [name] Verbal / Phone Orders: No Diagnosis Coding ICD-10 Coding Code Description L89.610 Pressure ulcer of right heel, unstageable L89.620 Pressure ulcer of left heel, unstageable I89.0 Lymphedema, not elsewhere classified E11.622 Type 2 diabetes mellitus with other skin ulcer Z79.84 Long term (current) use of oral hypoglycemic drugs I10 Essential (primary) hypertension I73.89 Other specified peripheral vascular diseases Follow-up Appointments Return Appointment in 1 week. Bathing/ Shower/ Hygiene May shower with wound dressing protected with water repellent cover or cast protector. Anesthetic (Use 'Patient Medications' Section for Anesthetic Order Entry) Lidocaine applied to wound bed Edema Control - Orders / Instructions Elevate, Exercise Daily and A void Standing for Long Periods of Time. Elevate legs to the level of the heart and pump ankles as often as possible Elevate leg(s) parallel to the floor when sitting. Off-Loading Turn and reposition every 2 hours Wound Treatment Wound #3 - Calcaneus Wound Laterality: Right Cleanser: Byram Ancillary Kit - 15 Day Supply (Generic) 3 x Per Week/30 Days Discharge Instructions: Use supplies as instructed; Kit contains: (15) Saline Bullets; (15) 3x3 Gauze; 15 pr Gloves Cleanser: Soap and Water 3 x Per  Week/30 Days  Discharge Instructions: Gently cleanse wound with antibacterial soap, rinse and pat dry prior to dressing wounds Prim Dressing: Gauze 3 x Per Week/30 Days ary Discharge Instructions: moistened with Dakins Solution Secondary Dressing: ABD Pad 5x9 (in/in) (Generic) 3 x Per Week/30 Days Discharge Instructions: Cover with ABD pad Secured With: Medipore T - 67M Medipore H Soft Cloth Surgical T ape ape, 2x2 (in/yd) (Generic) 3 x Per Week/30 Days Secured With: [employer] Sterile or Non-Sterile 6-ply 4.5x4 (yd/yd) (Generic) 3 x Per Week/30 Days Discharge Instructions: Apply Kerlix as directed Wound #4 - Calcaneus Wound Laterality: Left Cleanser: Byram Ancillary Kit - 15 Day Supply (Generic) 3 x Per Week/30 Days Discharge Instructions: Use supplies as instructed; Kit contains: (15) Saline Bullets; (15) 3x3 Gauze; 15 pr Gloves Cleanser: Soap and Water 3 x Per Week/30 Days Discharge Instructions: Gently cleanse wound with antibacterial soap, rinse and pat dry prior to dressing wounds Prim Dressing: Gauze 3 x Per Week/30 Days ary Discharge Instructions: moistened with Dakins Solution Secondary Dressing: ABD Pad 5x9 (in/in) (Generic) 3 x Per Week/30 Days Discharge Instructions: Cover with ABD pad Secured With: Medipore T - 67M Medipore H Soft Cloth Surgical T ape ape, 2x2 (in/yd) (Generic) 3 x Per Week/30 Days Secured With: [employer] Sterile or Non-Sterile 6-ply 4.5x4 (yd/yd) (Generic) 3 x Per Week/30 Days Discharge Instructions: Apply Mariah Milling as directed Joe Barry, Joe Barry ([id number]) [id number] Page 4 of 7 Electronic Signature(s) Signed: 07/29/2023 5:33:14 PM By: [name] PA-C Signed: 08/02/2023 8:25:20 AM By: [name] RN Entered By: [name] on 07/29/2023 13:23:33 -------------------------------------------------------------------------------- Problem List Details Patient Name: Date of Service: Joe Barry [name] 07/29/2023 3:15 PM Medical  Record Number: [id number] Patient Account Number: [id number] Date of Birth/Sex: Treating RN: March 14, 1934 (89 y.o. [name] Primary Care Provider: [name] Other Clinician: Referring Provider: Treating Provider/Extender: Kallie Edward in Treatment: 1 Active Problems ICD-10 Encounter Code Description Active Date MDM Diagnosis L89.610 Pressure ulcer of right heel, unstageable 07/22/2023 No Yes L89.620 Pressure ulcer of left heel, unstageable 07/22/2023 No Yes I89.0 Lymphedema, not elsewhere classified 07/22/2023 No Yes E11.622 Type 2 diabetes mellitus with other skin ulcer 07/22/2023 No Yes Z79.84 Long term (current) use of oral hypoglycemic drugs 07/22/2023 No Yes I10 Essential (primary) hypertension 07/22/2023 No Yes I73.89 Other specified peripheral vascular diseases 07/22/2023 No Yes Inactive Problems Resolved Problems Electronic Signature(s) Signed: 07/29/2023 3:24:54 PM By: [name] PA-C Entered By: [name] on 07/29/2023 12:24:54 [name] ([id number]) [id number] Page 5 of 7 -------------------------------------------------------------------------------- Progress Note Details Patient Name: Date of Service: Joe Barry [place] R. 07/29/2023 3:15 PM Medical Record Number: [id number] Patient Account Number: [id number] Date of Birth/Sex: Treating RN: 06-30-1934 (89 y.o. [name]) [name] Primary Care Provider: [name] Other Clinician: Referring Provider: Treating Provider/Extender: Kallie Edward in Treatment: 1 Subjective Chief Complaint Information obtained from Patient Bilateral Heel Ulcers History of Present Illness (HPI) 02-26-2023 upon evaluation today patient presents for initial inspection here in our clinic concerning issues that he has been having with a wound on his left anterior lower leg. This actually began on 01-14-2023 when the patient was getting out of the car and subsequently they  were turning the wheelchair around to move around the door and got too close to the edge of the door and the corner at the bottom actually cut his leg. This because of his lymphedema has been a very hard wound to heal following. Fortunately there does not appear to  be any signs of infection systemically which is good news. With that being said even locally I do not see any signs of infection at this point. I do believe that he is making fairly good progress all things considered although I think that we definitely need to clean some this out and get it moving in the right direction. Patient has been using Medih[employer] or rather his daughter has at home she is a Engineer, civil (consulting) she works in the ICU office seen her previously as well as a patient. Subsequently after it was apparent that this was not getting better they did go on 7 June to [place] [facility] and at that point they actually put the patient on Augmentin which she started on 02-19-2023 and is still continuing that through current. His most recent hemoglobin A1c was 6.9 on 01-16-2023. He did have an x-ray done of the leg but this has not been released yet it still pending a reading. Currently could not tolerate the ABI screening but he seems to have good perfusion in regard to his leg I do not know that we go to gotten a lot of a good reading anyway when it comes to the ABIs even if we were able to do the screening due to the amount of lymphedema that he has with associated swelling. Patient does have a history of lymphedema, diabetes mellitus type 2, he is on oral hypoglycemic agents. He also has a history of hypertension and peripheral vascular disease although his last ABI check in 2018 was normal according to his daughter and he has seen vascular again at that time but has not had any recent ABIs performed. Readmission: 07-22-2023 upon evaluation today patient presents for readmission I previously was taking care of him with regard to wounds on his  legs he is doing well with regard to his legs in fact are not even swelling is much as they were in the past. With that being said he unfortunately is having some issues here with his heels bilaterally where there is a pressure injury were not exactly sure where this came from. Fortunately there does not appear to be any signs of active infection locally or systemically which is great news. No fevers, chills, nausea, vomiting, or diarrhea. 07-29-2023 upon evaluation today patient appears to be doing decently well currently in regard to his wound. Has been tolerating the dressing changes without complication. Fortunately I do not see any signs of active infection at this time which is great news. With that being said there is a lot of necrotic tissue and I think that this is what is causing some of the odor on the left heel. We need to get this cleaned up as quickly as possible. Objective Constitutional Well-nourished and well-hydrated in no acute distress. Vitals Time Taken: 3:29 PM, Height: 65 in, Weight: 175 lbs, BMI: 29.1, Temperature: 98 F, Pulse: 85 bpm, Respiratory Rate: 18 breaths/min, Blood Pressure: 150/71 mmHg. Respiratory normal breathing without difficulty. Psychiatric this patient is able to make decisions and demonstrates good insight into disease process. Alert and Oriented x 3. pleasant and cooperative. General Notes: Upon inspection patient's wound bed actually showed signs of good granulation and epithelization at this point. Fortunately I do not see any signs of worsening a lot of the necrotic tissues cleaning out but he does have a quite a bit of an odor. I do not see any signs of obvious cellulitis right now but that something that we will need to keep a close eye  on going forward. Joe Barry, Joe Barry ([id number]) [id number] Page 6 of 7 Integumentary (Hair, Skin) Wound #3 status is Open. Original cause of wound was Gradually Appeared. The date acquired  was: 06/28/2023. The wound has been in treatment 1 weeks. The wound is located on the Right Calcaneus. The wound measures 1cm length x 1cm width x 0.8cm depth; 0.785cm^2 area and 0.628cm^3 volume. There is Fat Layer (Subcutaneous Tissue) exposed. There is no tunneling or undermining noted. There is a medium amount of serosanguineous drainage noted. There is small (1-33%) pink granulation within the wound bed. There is a large (67-100%) amount of necrotic tissue within the wound bed including Eschar and Adherent Slough. Wound #4 status is Open. Original cause of wound was Gradually Appeared. The date acquired was: 06/28/2023. The wound has been in treatment 1 weeks. The wound is located on the Left Calcaneus. The wound measures 3cm length x 3.3cm width x 0.7cm depth; 7.775cm^2 area and 5.443cm^3 volume. There is Fat Layer (Subcutaneous Tissue) exposed. There is no tunneling or undermining noted. There is a medium amount of serosanguineous drainage noted. There is small (1-33%) pink granulation within the wound bed. There is a large (67-100%) amount of necrotic tissue within the wound bed including Eschar. Assessment Active Problems ICD-10 Pressure ulcer of right heel, unstageable Pressure ulcer of left heel, unstageable Lymphedema, not elsewhere classified Type 2 diabetes mellitus with other skin ulcer Long term (current) use of oral hypoglycemic drugs Essential (primary) hypertension Other specified peripheral vascular diseases Plan Follow-up Appointments: Return Appointment in 1 week. Bathing/ Shower/ Hygiene: May shower with wound dressing protected with water repellent cover or cast protector. Anesthetic (Use 'Patient Medications' Section for Anesthetic Order Entry): Lidocaine applied to wound bed Edema Control - Orders / Instructions: Elevate, Exercise Daily and Avoid Standing for Long Periods of Time. Elevate legs to the level of the heart and pump ankles as often as  possible Elevate leg(s) parallel to the floor when sitting. Off-Loading: Turn and reposition every 2 hours WOUND #3: - Calcaneus Wound Laterality: Right Cleanser: Byram Ancillary Kit - 15 Day Supply (Generic) 3 x Per Week/30 Days Discharge Instructions: Use supplies as instructed; Kit contains: (15) Saline Bullets; (15) 3x3 Gauze; 15 pr Gloves Cleanser: Soap and Water 3 x Per Week/30 Days Discharge Instructions: Gently cleanse wound with antibacterial soap, rinse and pat dry prior to dressing wounds Prim Dressing: Gauze 3 x Per Week/30 Days ary Discharge Instructions: moistened with Dakins Solution Secondary Dressing: ABD Pad 5x9 (in/in) (Generic) 3 x Per Week/30 Days Discharge Instructions: Cover with ABD pad Secured With: Medipore T - 64M Medipore H Soft Cloth Surgical T ape ape, 2x2 (in/yd) (Generic) 3 x Per Week/30 Days Secured With: [employer] Sterile or Non-Sterile 6-ply 4.5x4 (yd/yd) (Generic) 3 x Per Week/30 Days Discharge Instructions: Apply Kerlix as directed WOUND #4: - Calcaneus Wound Laterality: Left Cleanser: Byram Ancillary Kit - 15 Day Supply (Generic) 3 x Per Week/30 Days Discharge Instructions: Use supplies as instructed; Kit contains: (15) Saline Bullets; (15) 3x3 Gauze; 15 pr Gloves Cleanser: Soap and Water 3 x Per Week/30 Days Discharge Instructions: Gently cleanse wound with antibacterial soap, rinse and pat dry prior to dressing wounds Prim Dressing: Gauze 3 x Per Week/30 Days ary Discharge Instructions: moistened with Dakins Solution Secondary Dressing: ABD Pad 5x9 (in/in) (Generic) 3 x Per Week/30 Days Discharge Instructions: Cover with ABD pad Secured With: Medipore T - 64M Medipore H Soft Cloth Surgical T ape ape, 2x2 (in/yd) (Generic) 3 x Per Week/30  Days Secured With: [employer] or Non-Sterile 6-ply 4.5x4 (yd/yd) (Generic) 3 x Per Week/30 Days Discharge Instructions: Apply Kerlix as directed 1. Based on what I am seeing I am going to recommend that  we have the patient switch to Dakin's moistened gauze dressing once a day I think this is gena be the best way to go currently. 2. Also can recommend that we continue with ABD pad to cover followed by roll gauze to secure in place. 3. I am also going to recommend that the patient continue to offload is much as possible to try to keep from any additional breakdown or injury. He is in agreement with that plan. We will see patient back for reevaluation in 1 week here in the clinic. If anything worsens or changes patient will contact our office for additional recommendations. Electronic Signature(s) Signed: 07/29/2023 3:59:45 PM By: Rockie Neighbours, [place] [facility] ([id number]) [id number] Page 7 of 7 Entered By: [name] on 07/29/2023 12:59:45 -------------------------------------------------------------------------------- SuperBill Details Patient Name: Date of Service: Joe Barry [facility] R. 07/29/2023 Medical Record Number: [id number] Patient Account Number: [id number] Date of Birth/Sex: Treating RN: 02/11/1934 (89 y.o. [name]) [name] Primary Care Provider: [name] Other Clinician: Referring Provider: Treating Provider/Extender: Kallie Edward in Treatment: 1 Diagnosis Coding ICD-10 Codes Code Description L89.610 Pressure ulcer of right heel, unstageable L89.620 Pressure ulcer of left heel, unstageable I89.0 Lymphedema, not elsewhere classified E11.622 Type 2 diabetes mellitus with other skin ulcer Z79.84 Long term (current) use of oral hypoglycemic drugs I10 Essential (primary) hypertension I73.89 Other specified peripheral vascular diseases Facility Procedures : CPT4 Code: [id number] Description: 99213 - WOUND CARE VISIT-LEV 3 EST PT Modifier: Quantity: 1 Physician Procedures : CPT4 Code Description Modifier [id number] 99213 - WC PHYS LEVEL 3 - EST PT ICD-10 Diagnosis Description L89.610 Pressure ulcer of right heel, unstageable  L89.620 Pressure ulcer of left heel, unstageable I89.0 Lymphedema, not elsewhere classified E11.622  Type 2 diabetes mellitus with other skin ulcer Quantity: 1 Electronic Signature(s) Signed: 07/29/2023 4:17:30 PM By: [name] RN Signed: 07/29/2023 5:33:14 PM By: [name] PA-C Previous Signature: 07/29/2023 4:00:03 PM Version By: [name] PA-C Entered By: [name] on 07/29/2023 13:17:29

## 2023-08-05 ENCOUNTER — Ambulatory Visit: Payer: Medicare PPO | Admitting: Physician Assistant

## 2023-08-16 ENCOUNTER — Encounter: Payer: Medicare PPO | Attending: Physician Assistant | Admitting: Physician Assistant

## 2023-08-16 DIAGNOSIS — L89620 Pressure ulcer of left heel, unstageable: Secondary | ICD-10-CM | POA: Insufficient documentation

## 2023-08-16 DIAGNOSIS — I10 Essential (primary) hypertension: Secondary | ICD-10-CM | POA: Diagnosis not present

## 2023-08-16 DIAGNOSIS — E11622 Type 2 diabetes mellitus with other skin ulcer: Secondary | ICD-10-CM | POA: Diagnosis not present

## 2023-08-16 DIAGNOSIS — E1151 Type 2 diabetes mellitus with diabetic peripheral angiopathy without gangrene: Secondary | ICD-10-CM | POA: Diagnosis not present

## 2023-08-16 DIAGNOSIS — I872 Venous insufficiency (chronic) (peripheral): Secondary | ICD-10-CM | POA: Insufficient documentation

## 2023-08-16 DIAGNOSIS — L89610 Pressure ulcer of right heel, unstageable: Secondary | ICD-10-CM | POA: Insufficient documentation

## 2023-08-16 DIAGNOSIS — Z7984 Long term (current) use of oral hypoglycemic drugs: Secondary | ICD-10-CM | POA: Diagnosis not present

## 2023-08-16 DIAGNOSIS — I890 Lymphedema, not elsewhere classified: Secondary | ICD-10-CM | POA: Diagnosis not present

## 2023-08-16 NOTE — Progress Notes (Signed)
AKHARI, WASHKO ([id number]) [id number] Page 1 of 11 Visit Report for 08/16/2023 Arrival Information Details Patient Name: Date of Service: Joe Barry [facility] R. 08/16/2023 10:15 A M Medical Record Number: [id number] Patient Account Number: [id number] Date of Birth/Sex: Treating RN: 19-Jul-1934 (89 y.o. [name]) [name] Primary Care Kaseem Vastine: [name] Other Clinician: Referring Rene Gonsoulin: Treating Kimberlynn Lumbra/Extender: Kallie Edward in Treatment: 3 Visit Information History Since Last Visit Added or deleted any medications: No Patient Arrived: Wheel Chair Any new allergies or adverse reactions: No Arrival Time: 10:23 Had a fall or experienced change in No Accompanied By: daughter activities of daily living that may affect Transfer Assistance: Manual risk of falls: Patient Identification Verified: Yes Signs or symptoms of abuse/neglect since last visito No Secondary Verification Process Completed: Yes Has Dressing in Place as Prescribed: Yes Patient Requires Transmission-Based Precautions: No Pain Present Now: No Patient Has Alerts: Yes Patient Alerts: DIABETIC Electronic Signature(s) Signed: 08/16/2023 3:33:10 PM By: [name] RN Entered By: [name] on 08/16/2023 10:28:28 -------------------------------------------------------------------------------- Clinic Level of Care Assessment Details Patient Name: Date of Service: Joe Barry [facility] R. 08/16/2023 10:15 A M Medical Record Number: [id number] Patient Account Number: [id number] Date of Birth/Sex: Treating RN: 1934-07-23 (89 y.o. [name]) [name] Primary Care Ariann Khaimov: [name] Other Clinician: Referring Keera Altidor: Treating Haylie Mccutcheon/Extender: Kallie Edward in Treatment: 3 Clinic Level of Care Assessment Items TOOL 4 Quantity Score X- 1 0 Use when only an EandM is performed on FOLLOW-UP visit ASSESSMENTS - Nursing Assessment / Reassessment X- 1  10 Reassessment of Co-morbidities (includes updates in patient status) X- 1 5 Reassessment of Adherence to Treatment Plan ASSESSMENTS - Wound and Skin A ssessment / Reassessment []  - 0 Simple Wound Assessment / Reassessment - one wound Joe Barry, Joe Barry ([id number]) [phone].pdf Page 2 of 11 X- 2 5 Complex Wound Assessment / Reassessment - multiple wounds []  - 0 Dermatologic / Skin Assessment (not related to wound area) ASSESSMENTS - Focused Assessment []  - 0 Circumferential Edema Measurements - multi extremities []  - 0 Nutritional Assessment / Counseling / Intervention []  - 0 Lower Extremity Assessment (monofilament, tuning fork, pulses) []  - 0 Peripheral Arterial Disease Assessment (using hand held doppler) ASSESSMENTS - Ostomy and/or Continence Assessment and Care []  - 0 Incontinence Assessment and Management []  - 0 Ostomy Care Assessment and Management (repouching, etc.) PROCESS - Coordination of Care X - Simple Patient / Family Education for ongoing care 1 15 []  - 0 Complex (extensive) Patient / Family Education for ongoing care []  - 0 Staff obtains Chiropractor, Records, T Results / Process Orders est []  - 0 Staff telephones HHA, Nursing Homes / Clarify orders / etc []  - 0 Routine Transfer to another Facility (non-emergent condition) []  - 0 Routine Hospital Admission (non-emergent condition) []  - 0 New Admissions / Manufacturing engineer / Ordering NPWT Apligraf, etc. , []  - 0 Emergency Hospital Admission (emergent condition) X- 1 10 Simple Discharge Coordination []  - 0 Complex (extensive) Discharge Coordination PROCESS - Special Needs []  - 0 Pediatric / Minor Patient Management []  - 0 Isolation Patient Management []  - 0 Hearing / Language / Visual special needs []  - 0 Assessment of Community assistance (transportation, D/C planning, etc.) []  - 0 Additional assistance / Altered mentation []  - 0 Support Surface(s) Assessment (bed,  cushion, seat, etc.) INTERVENTIONS - Wound Cleansing / Measurement []  - 0 Simple Wound Cleansing - one wound X- 2 5 Complex Wound Cleansing - multiple wounds X- 1 5  Wound Imaging (photographs - any number of wounds) []  - 0 Wound Tracing (instead of photographs) []  - 0 Simple Wound Measurement - one wound X- 2 5 Complex Wound Measurement - multiple wounds INTERVENTIONS - Wound Dressings X - Small Wound Dressing one or multiple wounds 2 10 []  - 0 Medium Wound Dressing one or multiple wounds []  - 0 Large Wound Dressing one or multiple wounds []  - 0 Application of Medications - topical []  - 0 Application of Medications - injection INTERVENTIONS - Miscellaneous []  - 0 External ear exam []  - 0 Specimen Collection (cultures, biopsies, blood, body fluids, etc.) []  - 0 Specimen(s) / Culture(s) sent or taken to Lab for analysis Joe Barry, Joe Barry ([id number]) [phone].pdf Page 3 of 11 []  - 0 Patient Transfer (multiple staff / Nurse, adult / Similar devices) []  - 0 Simple Staple / Suture removal (25 or less) []  - 0 Complex Staple / Suture removal (26 or more) []  - 0 Hypo / Hyperglycemic Management (close monitor of Blood Glucose) []  - 0 Ankle / Brachial Index (ABI) - do not check if billed separately X- 1 5 Vital Signs Has the patient been seen at the hospital within the last three years: Yes Total Score: 100 Level Of Care: New/Established - Level 3 Electronic Signature(s) Signed: 08/16/2023 3:33:10 PM By: [name] RN Entered By: [name] on 08/16/2023 15:27:44 -------------------------------------------------------------------------------- Encounter Discharge Information Details Patient Name: Date of Service: Joe Barry [name] 08/16/2023 10:15 A M Medical Record Number: [id number] Patient Account Number: [id number] Date of Birth/Sex: Treating RN: 06-16-1934 (89 y.o. Joe Barry Primary Care Elizabethanne Lusher: [name] Other Clinician: Referring  Drelyn Pistilli: Treating Kaedyn Belardo/Extender: Kallie Edward in Treatment: 3 Encounter Discharge Information Items Discharge Condition: Stable Ambulatory Status: Wheelchair Discharge Destination: Home Transportation: Private Auto Accompanied By: daughter Schedule Follow-up Appointment: Yes Clinical Summary of Care: Electronic Signature(s) Signed: 08/16/2023 3:28:43 PM By: [name] RN Entered By: [name] on 08/16/2023 15:28:43 -------------------------------------------------------------------------------- Lower Extremity Assessment Details Patient Name: Date of Service: Joe Barry [facility] R. 08/16/2023 10:15 A M Medical Record Number: [id number] Patient Account Number: [id number] Date of Birth/Sex: Treating RN: 06/06/1934 (89 y.o. Joe Barry Primary Care Trejan Buda: [name] Other Clinician: FODAY, Joe Barry ([id number]) [id number] Page 4 of 11 Referring Shalisha Clausing: Treating Buryl Bamber/Extender: Kallie Edward in Treatment: 3 Edema Assessment Assessed: [Left: No] [Right: No] [Left: Edema] [Right: :] Calf Left: Right: Point of Measurement: 35 cm From Medial Instep 39 cm 39 cm Ankle Left: Right: Point of Measurement: 10 cm From Medial Instep 30 cm 29 cm Knee To Floor Left: Right: From Medial Instep 47 cm 47 cm Vascular Assessment Pulses: Dorsalis Pedis Palpable: [Left:Yes] [Right:Yes] Extremity colors, hair growth, and conditions: Extremity Color: [Left:Hyperpigmented] [Right:Hyperpigmented] Hair Growth on Extremity: [Left:No] [Right:No] Temperature of Extremity: [Left:Warm] [Right:Warm] Capillary Refill: [Left:> 3 seconds] [Right:> 3 seconds] Dependent Rubor: [Left:No] Blanched when Elevated: [Left:No No] [Right:No No] Toe Nail Assessment Left: Right: Thick: Yes Yes Discolored: Yes Yes Deformed: Yes Yes Improper Length and Hygiene: Yes Yes Electronic Signature(s) Signed: 08/16/2023 3:33:10 PM By: [name] RN Entered By: [name] on 08/16/2023 10:39:14 -------------------------------------------------------------------------------- Multi Wound Chart Details Patient Name: Date of Service: Joe Barry [name] 08/16/2023 10:15 A M Medical Record Number: [id number] Patient Account Number: [id number] Date of Birth/Sex: Treating RN: 03/11/1934 (89 y.o. Joe Barry Primary Care Vashawn Ekstein: [name] Other Clinician: Referring Moet Mikulski: Treating Shelley Cocke/Extender: Kallie Edward in Treatment:  3 Vital Signs Height(in): 65 Pulse(bpm): 83 Weight(lbs): 175 Blood Pressure(mmHg): 131/82 Body Mass Index(BMI): 29.1 Temperature(F): 97.8 GRALYN, MAGIERA R ([id number]) [id number] Page 5 of 11 Respiratory Rate(breaths/min): 18 [3:Photos:] [N/A:N/A] Right Calcaneus Left Calcaneus N/A Wound Location: Gradually Appeared Gradually Appeared N/A Wounding Event: Pressure Ulcer Pressure Ulcer N/A Primary Etiology: Cataracts, Lymphedema, Cataracts, Lymphedema, N/A Comorbid History: Hypertension, Peripheral Venous Hypertension, Peripheral Venous Disease, Type II Diabetes Disease, Type II Diabetes 06/28/2023 06/28/2023 N/A Date Acquired: 3 3 N/A Weeks of Treatment: Open Open N/A Wound Status: No No N/A Wound Recurrence: 1x0.4x0.6 3x3.2x0.3 N/A Measurements L x W x D (cm) 0.314 7.54 N/A A (cm) : rea 0.188 2.262 N/A Volume (cm) : 76.20% 31.40% N/A % Reduction in A rea: 82.20% 70.60% N/A % Reduction in Volume: Unstageable/Unclassified Unstageable/Unclassified N/A Classification: Medium Medium N/A Exudate A mount: Serosanguineous Serosanguineous N/A Exudate Type: red, brown red, brown N/A Exudate Color: Small (1-33%) Large (67-100%) N/A Granulation A mount: Pink Pink N/A Granulation Quality: Large (67-100%) Small (1-33%) N/A Necrotic A mount: Eschar, Adherent Slough Adherent Slough N/A Necrotic Tissue: Fat Layer (Subcutaneous  Tissue): Yes Fat Layer (Subcutaneous Tissue): Yes N/A Exposed Structures: Fascia: No Fascia: No Tendon: No Tendon: No Muscle: No Muscle: No Joint: No Joint: No Bone: No Bone: No None None N/A Epithelialization: Treatment Notes Electronic Signature(s) Signed: 08/16/2023 3:33:10 PM By: [name] RN Entered By: [name] on 08/16/2023 10:39:19 -------------------------------------------------------------------------------- Multi-Disciplinary Care Plan Details Patient Name: Date of Service: Joe Barry [name] 08/16/2023 10:15 A M Medical Record Number: [id number] Patient Account Number: [id number] Date of Birth/Sex: Treating RN: [birth year] (89 y.o. Joe Barry Primary Care Almee Pelphrey: [name] Other Clinician: Referring Peggye Poon: Treating Dhana Totton/Extender: Kallie Edward in Treatment: [address] Joe Barry, Joe Barry ([id number]) [id number] Page 6 of 11 Abuse / Safety / Falls / Self Care Management Nursing Diagnoses: Potential for falls Goals: Patient will not experience any injury related to fire Date Initiated: 07/22/2023 Target Resolution Date: 08/21/2023 Goal Status: Active Interventions: Assess fall risk on admission and as needed Assess: immobility, friction, shearing, incontinence upon admission and as needed Assess impairment of mobility on admission and as needed per policy Assess self care needs on admission and as needed Notes: Necrotic Tissue Nursing Diagnoses: Impaired tissue integrity related to necrotic/devitalized tissue Goals: Necrotic/devitalized tissue will be minimized in the wound bed Date Initiated: 07/22/2023 Target Resolution Date: 08/21/2023 Goal Status: Active Interventions: Assess patient pain level pre-, during and post procedure and prior to discharge Provide education on necrotic tissue and debridement process Notes: Nutrition Nursing Diagnoses: Impaired glucose control: actual or  potential Goals: Patient/caregiver verbalizes understanding of need to maintain therapeutic glucose control per primary care physician Date Initiated: 07/22/2023 Target Resolution Date: 08/21/2023 Goal Status: Active Interventions: Assess HgA1c results as ordered upon admission and as needed Assess patient nutrition upon admission and as needed per policy Notes: Wound/Skin Impairment Nursing Diagnoses: Knowledge deficit related to ulceration/compromised skin integrity Goals: Patient/caregiver will verbalize understanding of skin care regimen Date Initiated: 07/22/2023 Target Resolution Date: 08/21/2023 Goal Status: Active Ulcer/skin breakdown will have a volume reduction of 30% by week 4 Date Initiated: 07/22/2023 Target Resolution Date: 08/21/2023 Goal Status: Active Ulcer/skin breakdown will have a volume reduction of 50% by week 8 Date Initiated: 07/22/2023 Target Resolution Date: 09/21/2023 Goal Status: Active Ulcer/skin breakdown will have a volume reduction of 80% by week 12 Date Initiated: 07/22/2023 Target Resolution Date: 10/22/2023 Goal Status: Active Ulcer/skin breakdown will heal within 14 weeks Date Initiated:  07/22/2023 Target Resolution Date: 11/19/2023 Goal Status: Active Interventions: Assess patient/caregiver ability to obtain necessary supplies Assess patient/caregiver ability to perform ulcer/skin care regimen upon admission and as needed Assess ulceration(s) every visit Notes: Joe Barry, Joe Barry ([id number]) [id number].pdf Page 7 of 11 Electronic Signature(s) Signed: 08/16/2023 3:33:10 PM By: [name] RN Entered By: [name] on 08/16/2023 10:39:36 -------------------------------------------------------------------------------- Pain Assessment Details Patient Name: Date of Service: Joe Barry [facility] R. 08/16/2023 10:15 A M Medical Record Number: [id number] Patient Account Number: [id number] Date of Birth/Sex: Treating RN: 01/26/1934 (89 y.o.  [name]) [name] Primary Care Hyder Deman: [name] Other Clinician: Referring Uziah Sorter: Treating Elward Nocera/Extender: Kallie Edward in Treatment: 3 Active Problems Location of Pain Severity and Description of Pain Patient Has Paino Yes Site Locations With Dressing Change: Yes Duration of the Pain. Constant / Intermittento Intermittent How Long Does it Lasto Hours: Minutes: 15 Rate the pain. Current Pain Level: 0 Worst Pain Level: 10 Least Pain Level: 0 Tolerable Pain Level: 5 Character of Pain Describe the Pain: Aching Pain Management and Medication Current Pain Management: Medication: Yes Cold Application: No Rest: Yes Massage: No Activity: No T.E.N.S.: No Heat Application: No Leg drop or elevation: No Is the Current Pain Management Adequate: Inadequate How does your wound impact your activities of daily livingo Sleep: No Bathing: No Appetite: No Relationship With Others: No Bladder Continence: No Emotions: No Bowel Continence: No Work: No Toileting: No Drive: No Dressing: No Hobbies: No Electronic Signature(s) Signed: 08/16/2023 3:33:10 PM By: [name] RN Entered By: [name] on 08/16/2023 10:36:35 Joe Barry ([id number]) [id number] Page 8 of 11 -------------------------------------------------------------------------------- Patient/Caregiver Education Details Patient Name: Date of Service: Joe [name] R. 12/2/2024andnbsp10:15 A M Medical Record Number: [id number] Patient Account Number: [id number] Date of Birth/Gender: Treating RN: [birth year] (89 y.o. [name]) [name] Primary Care Physician: [name] Other Clinician: Referring Physician: Treating Physician/Extender: Kallie Edward in Treatment: 3 Education Assessment Education Provided To: Patient Education Topics Provided Wound/Skin Impairment: Handouts: Caring for Your Ulcer Methods: Explain/Verbal Responses: State  content correctly Electronic Signature(s) Signed: 08/16/2023 3:33:10 PM By: [name] RN Entered By: [name] on 08/16/2023 10:39:48 -------------------------------------------------------------------------------- Wound Assessment Details Patient Name: Date of Service: Joe Barry [name] 08/16/2023 10:15 A M Medical Record Number: [id number] Patient Account Number: [id number] Date of Birth/Sex: Treating RN: [birth year] (89 y.o. Joe Barry Primary Care Yamaris Cummings: [name] Other Clinician: Referring Niara Bunker: Treating Brina Umeda/Extender: Kallie Edward in Treatment: 3 Wound Status Wound Number: 3 Primary Pressure Ulcer Etiology: Wound Location: Right Calcaneus Wound Open Wounding Event: Gradually Appeared Status: Date Acquired: 06/28/2023 Comorbid Cataracts, Lymphedema, Hypertension, Peripheral Venous Weeks Of Treatment: 3 History: Disease, Type II Diabetes Clustered Wound: No Photos Joe Barry, Joe Barry ([id number]) [id number] Page 9 of 11 Wound Measurements Length: (cm) 1 Width: (cm) 0.4 Depth: (cm) 0.6 Area: (cm) 0.314 Volume: (cm) 0.188 % Reduction in Area: 76.2% % Reduction in Volume: 82.2% Epithelialization: None Tunneling: No Undermining: No Wound Description Classification: Unstageable/Unclassified Exudate Amount: Medium Exudate Type: Serosanguineous Exudate Color: red, brown Foul Odor After Cleansing: No Slough/Fibrino Yes Wound Bed Granulation Amount: Small (1-33%) Exposed Structure Granulation Quality: Pink Fascia Exposed: No Necrotic Amount: Large (67-100%) Fat Layer (Subcutaneous Tissue) Exposed: Yes Necrotic Quality: Eschar, Adherent Slough Tendon Exposed: No Muscle Exposed: No Joint Exposed: No Bone Exposed: No Treatment Notes Wound #3 (Calcaneus) Wound Laterality: Right Cleanser Byram Ancillary Kit - 15 Day Supply Discharge Instruction: Use supplies as instructed;  Kit contains: (15) Saline  Bullets; (15) 3x3 Gauze; 15 pr Gloves Soap and Water Discharge Instruction: Gently cleanse wound with antibacterial soap, rinse and pat dry prior to dressing wounds Peri-Wound Care Topical Primary Dressing Iodosorb 40 (g) Discharge Instruction: Apply IodoSorb to wound bed only as directed. Secondary Dressing ABD Pad 5x9 (in/in) Discharge Instruction: Cover with ABD pad Secured With Medipore T - 41M Medipore H Soft Cloth Surgical T ape ape, 2x2 (in/yd) Kerlix Roll Sterile or Non-Sterile 6-ply 4.5x4 (yd/yd) Discharge Instruction: Apply Kerlix as directed Compression Wrap Compression Stockings Add-Ons Electronic Signature(s) Signed: 08/16/2023 3:33:10 PM By: [name] RN Entered By: [name] on 08/16/2023 10:37:33 Joe Barry ([id number]) [id number] Page 10 of 11 -------------------------------------------------------------------------------- Wound Assessment Details Patient Name: Date of Service: Joe Barry [facility] R. 08/16/2023 10:15 A M Medical Record Number: [id number] Patient Account Number: [id number] Date of Birth/Sex: Treating RN: Feb 06, 1934 (89 y.o. [name]) [name] Primary Care Kerline Trahan: [name] Other Clinician: Referring Gwendalyn Mcgonagle: Treating Prarthana Parlin/Extender: Kallie Edward in Treatment: 3 Wound Status Wound Number: 4 Primary Pressure Ulcer Etiology: Wound Location: Left Calcaneus Wound Open Wounding Event: Gradually Appeared Status: Date Acquired: 06/28/2023 Comorbid Cataracts, Lymphedema, Hypertension, Peripheral Venous Weeks Of Treatment: 3 History: Disease, Type II Diabetes Clustered Wound: No Photos Wound Measurements Length: (cm) 3 Width: (cm) 3.2 Depth: (cm) 0.3 Area: (cm) 7.54 Volume: (cm) 2.262 % Reduction in Area: 31.4% % Reduction in Volume: 70.6% Epithelialization: None Tunneling: No Undermining: No Wound Description Classification: Unstageable/Unclassified Exudate Amount: Medium Exudate  Type: Serosanguineous Exudate Color: red, brown Foul Odor After Cleansing: No Slough/Fibrino Yes Wound Bed Granulation Amount: Large (67-100%) Exposed Structure Granulation Quality: Pink Fascia Exposed: No Necrotic Amount: Small (1-33%) Fat Layer (Subcutaneous Tissue) Exposed: Yes Necrotic Quality: Adherent Slough Tendon Exposed: No Muscle Exposed: No Joint Exposed: No Bone Exposed: No Treatment Notes Wound #4 (Calcaneus) Wound Laterality: Left Cleanser Byram Ancillary Kit - 15 Day Supply Discharge Instruction: Use supplies as instructed; Kit contains: (15) Saline Bullets; (15) 3x3 Gauze; [address] Joe Barry, Joe R ([id number]) [id number] Page 11 of 11 Discharge Instruction: Gently cleanse wound with antibacterial soap, rinse and pat dry prior to dressing wounds Peri-Wound Care Topical Primary Dressing Hydrofera Blue Ready Transfer Foam, 2.5x2.5 (in/in) Discharge Instruction: Apply Hydrofera Blue Ready to wound bed as directed Secondary Dressing ABD Pad 5x9 (in/in) Discharge Instruction: Cover with ABD pad Secured With Medipore T - 41M Medipore H Soft Cloth Surgical T ape ape, 2x2 (in/yd) Kerlix Roll Sterile or Non-Sterile 6-ply 4.5x4 (yd/yd) Discharge Instruction: Apply Kerlix as directed Compression Wrap Compression Stockings Add-Ons Electronic Signature(s) Signed: 08/16/2023 3:33:10 PM By: [name] RN Entered By: [name] on 08/16/2023 10:38:06 -------------------------------------------------------------------------------- Vitals Details Patient Name: Date of Service: Joe Barry [name] 08/16/2023 10:15 A M Medical Record Number: [id number] Patient Account Number: [id number] Date of Birth/Sex: Treating RN: 1934-04-08 (89 y.o. [name]) [name] Primary Care Bryann Gentz: [name] Other Clinician: Referring Tiberius Loftus: Treating Donielle Kaigler/Extender: Kallie Edward in Treatment: 3 Vital Signs Time Taken:  10:28 Temperature (F): 97.8 Height (in): 65 Pulse (bpm): 83 Weight (lbs): 175 Respiratory Rate (breaths/min): 18 Body Mass Index (BMI): 29.1 Blood Pressure (mmHg): 131/82 Reference Range: 80 - 120 mg / dl Electronic Signature(s) Signed: 08/16/2023 3:33:10 PM By: [name] RN Entered By: [name] on 08/16/2023 10:29:16

## 2023-08-16 NOTE — Progress Notes (Addendum)
RICCARDO, HENDREN ([id number]) [id number] Page 1 of 7 Visit Report for 08/16/2023 Chief Complaint Document Details Patient Name: Date of Service: Joe Barry Joe [facility] [place] R. 08/16/2023 10:15 A M Medical Record Number: [id number] Patient Account Number: [id number] Date of Birth/Sex: Treating RN: 1934-04-28 (89 y.o. Joe Barry) [name] Primary Care Provider: Sharlot Barry Other Clinician: Referring Provider: Treating Provider/Extender: Joe Barry in Treatment: 3 Information Obtained from: Patient Chief Complaint Bilateral Heel Ulcers Electronic Signature(s) Signed: 08/16/2023 10:49:32 AM By: Joe Derry PA-C Entered By: Joe Barry on 08/16/2023 10:49:32 -------------------------------------------------------------------------------- HPI Details Patient Name: Date of Service: Joe Barry Joe R. 08/16/2023 10:15 A M Medical Record Number: [id number] Patient Account Number: [id number] Date of Birth/Sex: Treating RN: [birth year] (89 y.o. Joe Barry Primary Care Provider: Sharlot Barry Other Clinician: Referring Provider: Treating Provider/Extender: Joe Barry in Treatment: 3 History of Present Illness HPI Description: 02-26-2023 upon evaluation today patient presents for initial inspection here in our clinic concerning issues that he has been having with a wound on his left anterior lower leg. This actually began on 01-14-2023 when the patient was getting out of the car and subsequently they were turning the wheelchair around to move around the door and got too close to the edge of the door and the corner at the bottom actually cut his leg. This because of his lymphedema has been a very hard wound to heal following. Fortunately there does not appear to be any signs of infection systemically which is good news. With that being said even locally I do not see any signs of infection at this point. I do believe that he is making fairly good  progress all things considered although I think that we definitely need to clean some this out and get it moving in the right direction. Patient has been using Medih[employer] or rather his daughter has at home she is a Engineer, civil (consulting) she works in the ICU office seen her previously as well as a patient. Subsequently after it was apparent that this was not getting better they did go on 7 June to [place] [facility] and at that point they actually put the patient on Augmentin which she started on 02-19-2023 and is still continuing that through current. His most recent hemoglobin A1c was 6.9 on 01-16-2023. He did have an x-ray done of the leg but this has not been released yet it still pending a reading. Currently could not tolerate the ABI screening but he seems to have good perfusion in regard to his leg I do not know that we go to gotten a lot of a good reading anyway when it comes to the ABIs even if we were able to do the screening due to the amount of lymphedema that he has with associated swelling. Patient does have a history of lymphedema, diabetes mellitus type 2, he is on oral hypoglycemic agents. He also has a history of hypertension and peripheral vascular disease although his last ABI check in 2018 was normal according to his daughter and he has seen vascular again at that time but has not had any recent ABIs performed. Joe Barry, Joe Barry ([id number]) [id number] Page 2 of 7 Readmission: 07-22-2023 upon evaluation today patient presents for readmission I previously was taking care of him with regard to wounds on his legs he is doing well with regard to his legs in fact are not even swelling is much as they were in the past. With that being said he unfortunately  is having some issues here with his heels bilaterally where there is a pressure injury were not exactly sure where this came from. Fortunately there does not appear to be any signs of active infection locally or systemically  which is great news. No fevers, chills, nausea, vomiting, or diarrhea. 07-29-2023 upon evaluation today patient appears to be doing decently well currently in regard to his wound. Has been tolerating the dressing changes without complication. Fortunately I do not see any signs of active infection at this time which is great news. With that being said there is a lot of necrotic tissue and I think that this is what is causing some of the odor on the left heel. We need to get this cleaned up as quickly as possible. 08-16-2023 upon evaluation today patient appears to be doing well currently in regard to his heel ulcer. Has been tolerating the dressing changes without complication. Fortunately I do not see any signs of active infection at this time which is good news. No fever chills noted Electronic Signature(s) Signed: 08/16/2023 1:22:11 PM By: Joe Derry PA-C Entered By: Joe Barry on 08/16/2023 13:22:11 -------------------------------------------------------------------------------- Physical Exam Details Patient Name: Date of Service: Joe Barry Joe R. 08/16/2023 10:15 A M Medical Record Number: [id number] Patient Account Number: [id number] Date of Birth/Sex: Treating RN: September 19, 1933 (89 y.o. Joe Barry Primary Care Provider: Sharlot Barry Other Clinician: Referring Provider: Treating Provider/Extender: Joe Barry in Treatment: 3 Constitutional Well-nourished and well-hydrated in no acute distress. Respiratory normal breathing without difficulty. Psychiatric this patient is able to make decisions and demonstrates good insight into disease process. Alert and Oriented x 3. pleasant and cooperative. Notes Upon inspection patient's wound bed actually showed signs of good granulation epithelization at this point. Fortunately I do not see any evidence of active infection currently which is great news and in general I do believe that we are making good headway here towards  closure. Electronic Signature(s) Signed: 08/16/2023 1:22:30 PM By: Joe Derry PA-C Entered By: Joe Barry on 08/16/2023 13:22:29 -------------------------------------------------------------------------------- Physician Orders Details Patient Name: Date of Service: Joe Barry Joe R. 08/16/2023 10:15 A M Medical Record Number: [id number] Patient Account Number: [id number] Joe Barry, Joe Barry ([id number]) [id number] Page 3 of 7 Date of Birth/Sex: Treating RN: [birth year] (89 y.o. Joe Barry) [name] Primary Care Provider: Other Clinician: Sharlot Barry Referring Provider: Treating Provider/Extender: Joe Barry in Treatment: 3 The following information was scribed by: [name] The information was scribed for: Joe Barry Verbal / Phone Orders: No Diagnosis Coding ICD-10 Coding Code Description L89.610 Pressure ulcer of right heel, unstageable L89.620 Pressure ulcer of left heel, unstageable I89.0 Lymphedema, not elsewhere classified E11.622 Type 2 diabetes mellitus with other skin ulcer Z79.84 Long term (current) use of oral hypoglycemic drugs I10 Essential (primary) hypertension I73.89 Other specified peripheral vascular diseases Follow-up Appointments Return Appointment in 1 week. Bathing/ Shower/ Hygiene May shower with wound dressing protected with water repellent cover or cast protector. Anesthetic (Use 'Patient Medications' Section for Anesthetic Order Entry) Lidocaine applied to wound bed Edema Control - Orders / Instructions Elevate, Exercise Daily and A void Standing for Long Periods of Time. Elevate legs to the level of the heart and pump ankles as often as possible Elevate leg(s) parallel to the floor when sitting. Off-Loading Turn and reposition every 2 hours Wound Treatment Wound #3 - Calcaneus Wound Laterality: Right Cleanser: Byram Ancillary Kit - 15 Day Supply (Generic) 3 x Per Week/30 Days Discharge Instructions:  Use supplies as instructed; Kit contains: (15) Saline Bullets; (15) 3x3 Gauze; 15 pr Gloves Cleanser: Soap and Water 3 x Per Week/30 Days Discharge Instructions: Gently cleanse wound with antibacterial soap, rinse and pat dry prior to dressing wounds Prim Dressing: Iodosorb 40 (g) 3 x Per Week/30 Days ary Discharge Instructions: Apply IodoSorb to wound bed only as directed. Secondary Dressing: ABD Pad 5x9 (in/in) (Generic) 3 x Per Week/30 Days Discharge Instructions: Cover with ABD pad Secured With: Medipore T - 64M Medipore H Soft Cloth Surgical T ape ape, 2x2 (in/yd) (Generic) 3 x Per Week/30 Days Secured With: [employer] Sterile or Non-Sterile 6-ply 4.5x4 (yd/yd) (Generic) 3 x Per Week/30 Days Discharge Instructions: Apply Kerlix as directed Wound #4 - Calcaneus Wound Laterality: Left Cleanser: Byram Ancillary Kit - 15 Day Supply (Generic) 3 x Per Week/30 Days Discharge Instructions: Use supplies as instructed; Kit contains: (15) Saline Bullets; (15) 3x3 Gauze; 15 pr Gloves Cleanser: Soap and Water 3 x Per Week/30 Days Discharge Instructions: Gently cleanse wound with antibacterial soap, rinse and pat dry prior to dressing wounds Prim Dressing: Hydrofera Blue Ready Transfer Foam, 2.5x2.5 (in/in) 3 x Per Week/30 Days ary Discharge Instructions: Apply Hydrofera Blue Ready to wound bed as directed Secondary Dressing: ABD Pad 5x9 (in/in) (Generic) 3 x Per Week/30 Days Discharge Instructions: Cover with ABD pad Secured With: Medipore T - 64M Medipore H Soft Cloth Surgical T ape ape, 2x2 (in/yd) (Generic) 3 x Per Week/30 Days Secured With: [employer] Sterile or Non-Sterile 6-ply 4.5x4 (yd/yd) (Generic) 3 x Per Week/30 Days Discharge Instructions: Apply Mariah Milling as directed Joe Barry, Joe Barry ([id number]) [id number] Page 4 of 7 Electronic Signature(s) Signed: 08/16/2023 3:33:10 PM By: [name] RN Signed: 08/16/2023 3:39:27 PM By: Joe Derry PA-C Entered By: [name] on 08/16/2023 11:37:13 -------------------------------------------------------------------------------- Problem List Details Patient Name: Date of Service: Joe Barry Joe R. 08/16/2023 10:15 A M Medical Record Number: [id number] Patient Account Number: [id number] Date of Birth/Sex: Treating RN: 25-Apr-1934 (89 y.o. Joe Barry Primary Care Provider: Sharlot Barry Other Clinician: Referring Provider: Treating Provider/Extender: Joe Barry in Treatment: 3 Active Problems ICD-10 Encounter Code Description Active Date MDM Diagnosis L89.610 Pressure ulcer of right heel, unstageable 07/22/2023 No Yes L89.620 Pressure ulcer of left heel, unstageable 07/22/2023 No Yes I89.0 Lymphedema, not elsewhere classified 07/22/2023 No Yes E11.622 Type 2 diabetes mellitus with other skin ulcer 07/22/2023 No Yes Z79.84 Long term (current) use of oral hypoglycemic drugs 07/22/2023 No Yes I10 Essential (primary) hypertension 07/22/2023 No Yes I73.89 Other specified peripheral vascular diseases 07/22/2023 No Yes Inactive Problems Resolved Problems Electronic Signature(s) Signed: 08/16/2023 10:49:28 AM By: Joe Derry PA-C Entered By: Joe Barry on 08/16/2023 10:49:27 Joe Barry ([id number]) [id number] Page 5 of 7 -------------------------------------------------------------------------------- Progress Note Details Patient Name: Date of Service: Joe Barry [facility] R. 08/16/2023 10:15 A M Medical Record Number: [id number] Patient Account Number: [id number] Date of Birth/Sex: Treating RN: [birth year] (89 y.o. Joe Barry) [name] Primary Care Provider: Sharlot Barry Other Clinician: Referring Provider: Treating Provider/Extender: Joe Barry in Treatment: 3 Subjective Chief Complaint Information obtained from Patient Bilateral Heel Ulcers History of Present Illness (HPI) 02-26-2023 upon evaluation today patient presents for initial  inspection here in our clinic concerning issues that he has been having with a wound on his left anterior lower leg. This actually began on 01-14-2023 when the patient was getting out of the car and subsequently they were turning the wheelchair around to move  around the door and got too close to the edge of the door and the corner at the bottom actually cut his leg. This because of his lymphedema has been a very hard wound to heal following. Fortunately there does not appear to be any signs of infection systemically which is good news. With that being said even locally I do not see any signs of infection at this point. I do believe that he is making fairly good progress all things considered although I think that we definitely need to clean some this out and get it moving in the right direction. Patient has been using Medih[employer] or rather his daughter has at home she is a Engineer, civil (consulting) she works in the ICU office seen her previously as well as a patient. Subsequently after it was apparent that this was not getting better they did go on 7 June to [place] [facility] and at that point they actually put the patient on Augmentin which she started on 02-19-2023 and is still continuing that through current. His most recent hemoglobin A1c was 6.9 on 01-16-2023. He did have an x-ray done of the leg but this has not been released yet it still pending a reading. Currently could not tolerate the ABI screening but he seems to have good perfusion in regard to his leg I do not know that we go to gotten a lot of a good reading anyway when it comes to the ABIs even if we were able to do the screening due to the amount of lymphedema that he has with associated swelling. Patient does have a history of lymphedema, diabetes mellitus type 2, he is on oral hypoglycemic agents. He also has a history of hypertension and peripheral vascular disease although his last ABI check in 2018 was normal according to his daughter and he has seen  vascular again at that time but has not had any recent ABIs performed. Readmission: 07-22-2023 upon evaluation today patient presents for readmission I previously was taking care of him with regard to wounds on his legs he is doing well with regard to his legs in fact are not even swelling is much as they were in the past. With that being said he unfortunately is having some issues here with his heels bilaterally where there is a pressure injury were not exactly sure where this came from. Fortunately there does not appear to be any signs of active infection locally or systemically which is great news. No fevers, chills, nausea, vomiting, or diarrhea. 07-29-2023 upon evaluation today patient appears to be doing decently well currently in regard to his wound. Has been tolerating the dressing changes without complication. Fortunately I do not see any signs of active infection at this time which is great news. With that being said there is a lot of necrotic tissue and I think that this is what is causing some of the odor on the left heel. We need to get this cleaned up as quickly as possible. 08-16-2023 upon evaluation today patient appears to be doing well currently in regard to his heel ulcer. Has been tolerating the dressing changes without complication. Fortunately I do not see any signs of active infection at this time which is good news. No fever chills noted Objective Constitutional Well-nourished and well-hydrated in no acute distress. Vitals Time Taken: 10:28 AM, Height: 65 in, Weight: 175 lbs, BMI: 29.1, Temperature: 97.8 F, Pulse: 83 bpm, Respiratory Rate: 18 breaths/min, Blood Pressure: 131/82 mmHg. Respiratory normal breathing without difficulty. Psychiatric this patient  is able to make decisions and demonstrates good insight into disease process. Alert and Oriented x 3. pleasant and cooperative. Joe Barry, Joe Barry ([id number]) [id number] Page 6 of 7 General  Notes: Upon inspection patient's wound bed actually showed signs of good granulation epithelization at this point. Fortunately I do not see any evidence of active infection currently which is great news and in general I do believe that we are making good headway here towards closure. Integumentary (Hair, Skin) Wound #3 status is Open. Original cause of wound was Gradually Appeared. The date acquired was: 06/28/2023. The wound has been in treatment 3 weeks. The wound is located on the Right Calcaneus. The wound measures 1cm length x 0.4cm width x 0.6cm depth; 0.314cm^2 area and 0.188cm^3 volume. There is Fat Layer (Subcutaneous Tissue) exposed. There is no tunneling or undermining noted. There is a medium amount of serosanguineous drainage noted. There is small (1-33%) pink granulation within the wound bed. There is a large (67-100%) amount of necrotic tissue within the wound bed including Eschar and Adherent Slough. Wound #4 status is Open. Original cause of wound was Gradually Appeared. The date acquired was: 06/28/2023. The wound has been in treatment 3 weeks. The wound is located on the Left Calcaneus. The wound measures 3cm length x 3.2cm width x 0.3cm depth; 7.54cm^2 area and 2.262cm^3 volume. There is Fat Layer (Subcutaneous Tissue) exposed. There is no tunneling or undermining noted. There is a medium amount of serosanguineous drainage noted. There is large (67-100%) pink granulation within the wound bed. There is a small (1-33%) amount of necrotic tissue within the wound bed including Adherent Slough. Assessment Active Problems ICD-10 Pressure ulcer of right heel, unstageable Pressure ulcer of left heel, unstageable Lymphedema, not elsewhere classified Type 2 diabetes mellitus with other skin ulcer Long term (current) use of oral hypoglycemic drugs Essential (primary) hypertension Other specified peripheral vascular diseases Plan Follow-up Appointments: Return Appointment in 1  week. Bathing/ Shower/ Hygiene: May shower with wound dressing protected with water repellent cover or cast protector. Anesthetic (Use 'Patient Medications' Section for Anesthetic Order Entry): Lidocaine applied to wound bed Edema Control - Orders / Instructions: Elevate, Exercise Daily and Avoid Standing for Long Periods of Time. Elevate legs to the level of the heart and pump ankles as often as possible Elevate leg(s) parallel to the floor when sitting. Off-Loading: Turn and reposition every 2 hours WOUND #3: - Calcaneus Wound Laterality: Right Cleanser: Byram Ancillary Kit - 15 Day Supply (Generic) 3 x Per Week/30 Days Discharge Instructions: Use supplies as instructed; Kit contains: (15) Saline Bullets; (15) 3x3 Gauze; 15 pr Gloves Cleanser: Soap and Water 3 x Per Week/30 Days Discharge Instructions: Gently cleanse wound with antibacterial soap, rinse and pat dry prior to dressing wounds Prim Dressing: Iodosorb 40 (g) 3 x Per Week/30 Days ary Discharge Instructions: Apply IodoSorb to wound bed only as directed. Secondary Dressing: ABD Pad 5x9 (in/in) (Generic) 3 x Per Week/30 Days Discharge Instructions: Cover with ABD pad Secured With: Medipore T - 55M Medipore H Soft Cloth Surgical T ape ape, 2x2 (in/yd) (Generic) 3 x Per Week/30 Days Secured With: [employer] Sterile or Non-Sterile 6-ply 4.5x4 (yd/yd) (Generic) 3 x Per Week/30 Days Discharge Instructions: Apply Kerlix as directed WOUND #4: - Calcaneus Wound Laterality: Left Cleanser: Byram Ancillary Kit - 15 Day Supply (Generic) 3 x Per Week/30 Days Discharge Instructions: Use supplies as instructed; Kit contains: (15) Saline Bullets; (15) 3x3 Gauze; 15 pr Gloves Cleanser: Soap and Water 3 x Per Week/30  Days Discharge Instructions: Gently cleanse wound with antibacterial soap, rinse and pat dry prior to dressing wounds Prim Dressing: Hydrofera Blue Ready Transfer Foam, 2.5x2.5 (in/in) 3 x Per Week/30 Days ary Discharge  Instructions: Apply Hydrofera Blue Ready to wound bed as directed Secondary Dressing: ABD Pad 5x9 (in/in) (Generic) 3 x Per Week/30 Days Discharge Instructions: Cover with ABD pad Secured With: Medipore T - 72M Medipore H Soft Cloth Surgical T ape ape, 2x2 (in/yd) (Generic) 3 x Per Week/30 Days Secured With: [employer] Sterile or Non-Sterile 6-ply 4.5x4 (yd/yd) (Generic) 3 x Per Week/30 Days Discharge Instructions: Apply Kerlix as directed 1. I would recommend that we have the patient going to continue to monitor for any signs of infection or worsening. I do believe that overall the patient is showing signs of healing and I feel like switching to [facility] on the left heel would be a good way to go. 2. I am also can recommend the patient should continue to utilize the Iodosorb for the right heel which I think is doing a good job here. 3. I am also going to recommend that he should continue to offload the heels is much as possible to help with edema control the more this that he can do the better in my opinion. We will see patient back for reevaluation in 1 week here in the clinic. If anything worsens or changes patient will contact our office for additional recommendations. Electronic Signature(s) Joe Barry, Joe Barry ([id number]) [id number] Page 7 of 7 Signed: 08/16/2023 1:23:19 PM By: Joe Derry PA-C Entered By: Joe Barry on 08/16/2023 13:23:19 -------------------------------------------------------------------------------- SuperBill Details Patient Name: Date of Service: Joe Adrian Prince R. 08/16/2023 Medical Record Number: [id number] Patient Account Number: [id number] Date of Birth/Sex: Treating RN: 02-Jun-1934 (89 y.o. Joe Barry) [name] Primary Care Provider: Sharlot Barry Other Clinician: Referring Provider: Treating Provider/Extender: Joe Barry in Treatment: 3 Diagnosis Coding ICD-10 Codes Code Description L89.610 Pressure ulcer of  right heel, unstageable L89.620 Pressure ulcer of left heel, unstageable I89.0 Lymphedema, not elsewhere classified E11.622 Type 2 diabetes mellitus with other skin ulcer Z79.84 Long term (current) use of oral hypoglycemic drugs I10 Essential (primary) hypertension I73.89 Other specified peripheral vascular diseases Facility Procedures : CPT4 Code: [id number] Description: 99213 - WOUND CARE VISIT-LEV 3 EST PT Modifier: Quantity: 1 Physician Procedures : CPT4 Code Description Modifier [id number] 99213 - WC PHYS LEVEL 3 - EST PT ICD-10 Diagnosis Description L89.610 Pressure ulcer of right heel, unstageable L89.620 Pressure ulcer of left heel, unstageable I89.0 Lymphedema, not elsewhere classified E11.622  Type 2 diabetes mellitus with other skin ulcer Quantity: 1 Electronic Signature(s) Signed: 08/16/2023 3:27:56 PM By: [name] RN Signed: 08/16/2023 3:39:27 PM By: Joe Derry PA-C Previous Signature: 08/16/2023 1:27:09 PM Version By: Joe Derry PA-C Entered By: [name] on 08/16/2023 15:27:55

## 2023-08-19 ENCOUNTER — Other Ambulatory Visit: Payer: Self-pay | Admitting: Family Medicine

## 2023-08-19 DIAGNOSIS — E1159 Type 2 diabetes mellitus with other circulatory complications: Secondary | ICD-10-CM

## 2023-08-19 DIAGNOSIS — E1169 Type 2 diabetes mellitus with other specified complication: Secondary | ICD-10-CM

## 2023-08-24 ENCOUNTER — Ambulatory Visit: Payer: Medicare PPO

## 2023-08-24 DIAGNOSIS — Z0000 Encounter for general adult medical examination without abnormal findings: Secondary | ICD-10-CM | POA: Diagnosis not present

## 2023-08-24 NOTE — Patient Instructions (Signed)
Joe Barry , Thank you for taking time to come for your Medicare Wellness Visit. I appreciate your ongoing commitment to your health goals. Please review the following plan we discussed and let me know if I can assist you in the future.   Referrals/Orders/Follow-Ups/Clinician Recommendations: none  This is a list of the screening recommended for you and due dates:  Health Maintenance  Topic Date Due   Zoster (Shingles) Vaccine (1 of 2) Never done   Eye exam for diabetics  07/29/2016   Complete foot exam   07/09/2021   DTaP/Tdap/Td vaccine (2 - Td or Tdap) 01/20/2022   Flu Shot  04/15/2023   COVID-19 Vaccine (5 - 2023-24 season) 05/16/2023   Hemoglobin A1C  07/17/2023   Medicare Annual Wellness Visit  08/23/2024   Pneumonia Vaccine  Completed   HPV Vaccine  Aged Out    Advanced directives: (Provided) Advance directive discussed with you today. I have provided a copy for you to complete at home and have notarized. Once this is complete, please bring a copy in to our office so we can scan it into your chart.   Next Medicare Annual Wellness Visit scheduled for next year: Yes  insert Preventive Care attachment Insert FALL PREVENTION attachment if needed

## 2023-08-24 NOTE — Progress Notes (Signed)
Subjective:   Joe Barry is a 89 y.o. male who presents for Medicare Annual/Subsequent preventive examination.  Visit Complete: Virtual I connected with  Joe Barry on 08/24/23 by a audio enabled telemedicine application and verified that I am speaking with the correct person using two identifiers. Daughter Joe Barry also on call.  Patient Location: Home  Provider Location: Office/Clinic  I discussed the limitations of evaluation and management by telemedicine. The patient expressed understanding and agreed to proceed.  Vital Signs: Because this visit was a virtual/telehealth visit, some criteria may be missing or patient reported. Any vitals not documented were not able to be obtained and vitals that have been documented are patient reported.    Cardiac Risk Factors include: advanced age (>32men, >63 women);diabetes mellitus;dyslipidemia;hypertension;male gender     Objective:    Today's Vitals   There is no height or weight on file to calculate BMI.     08/24/2023    9:18 AM 09/11/2022    8:47 AM 03/12/2022    3:00 AM 02/25/2022   11:11 AM 09/03/2021   11:48 PM 07/25/2021   12:06 PM 07/09/2020    2:00 PM  Advanced Directives  Does Patient Have a Medical Advance Directive? Yes Yes No No No Yes Yes  Type of Estate agent of [place];Living will Healthcare Power of [place];Living will    Healthcare Power of [place];Living will Living will  Does patient want to make changes to medical advance directive?       No - Patient declined  Copy of Healthcare Power of Attorney in Chart? No - copy requested No - copy requested    No - copy requested   Would patient like information on creating a medical advance directive?   No - Patient declined  No - Patient declined      Current Medications (verified) Outpatient Encounter Medications as of 08/24/2023  Medication Sig   Accu-Chek FastClix Lancets MISC USE AS DIRECTED   acetaminophen (TYLENOL) 500 MG tablet  Take 500 mg by mouth every 6 (six) hours as needed for moderate pain.   amLODipine (NORVASC) 10 MG tablet TAKE 1 TABLET(10 MG) BY MOUTH DAILY   blood glucose meter kit and supplies KIT Dispense based on patient and insurance preference. Use up to four times daily as directed.   Blood Glucose Monitoring Suppl (FORA V12 BLOOD GLUCOSE SYSTEM) DEVI Testing once a day   finasteride (PROSCAR) 5 MG tablet Take 5 mg by mouth daily.   gabapentin (NEURONTIN) 100 MG capsule Take 2 capsules (200 mg total) by mouth at bedtime.   glucose blood (ACCU-CHEK GUIDE) test strip CHECK BLOOD GLUCOSE TWICE DAILY   hydrochlorothiazide (MICROZIDE) 12.5 MG capsule TAKE 1 CAPSULE(12.5 MG) BY MOUTH DAILY   ibuprofen (ADVIL) 200 MG tablet Take 200 mg by mouth every 6 (six) hours as needed for mild pain.   JANUVIA 50 MG tablet TAKE 1 TABLET(50 MG) BY MOUTH DAILY.   Lancet Devices (LITE TOUCH LANCING DEVICE) MISC    lovastatin (MEVACOR) 40 MG tablet TAKE 1 TABLET(40 MG) BY MOUTH AT BEDTIME   MYRBETRIQ 50 MG TB24 tablet Take 50 mg by mouth daily.   pioglitazone-metformin (ACTOPLUS MET) 15-850 MG tablet TAKE 1 TABLET BY MOUTH TWICE DAILY WITH A MEAL (Patient taking differently: Take 1 tablet by mouth daily.)   No facility-administered encounter medications on file as of 08/24/2023.    Allergies (verified) Other   History: Past Medical History:  Diagnosis Date   AAA (abdominal aortic  aneurysm) (HCC)    Arthritis    BPH (benign prostatic hypertrophy)    Cataract    Diabetes mellitus    Dyslipidemia    Hypertension    Dr. Romilda Joy,    Neuromuscular disorder [facility])    carpal tunnel in right wrist   No blood products    Refusal of blood transfusions as patient is Jehovah's Witness    Past Surgical History:  Procedure Laterality Date   CARPAL TUNNEL RELEASE     CHOLECYSTECTOMY     COLONOSCOPY  2008   EYE SURGERY     bilat cataract    FALSE ANEURYSM REPAIR  07/16/2012   Procedure: REPAIR FALSE ANEURYSM;  Surgeon:  Larina Earthly, MD;  Location: [facility] OR;  Service: Vascular;  Laterality: Bilateral;  false femoral aneurysm repair with femoral artery repair   GAS/FLUID EXCHANGE  01/28/2012   Procedure: GAS/FLUID EXCHANGE;  Surgeon: Sherrie George, MD;  Location: [facility] OR;  Service: Ophthalmology;  Laterality: Right;   I & D EXTREMITY  09/23/2012   Procedure: IRRIGATION AND DEBRIDEMENT EXTREMITY;  Surgeon: Larina Earthly, MD;  Location: [facility] OR;  Service: Vascular;  Laterality: Right;  Irrigation and Debridement prepubic and right groin wound   PARS PLANA VITRECTOMY W/ REPAIR OF MACULAR HOLE  01/2012   PARS PLANA VITRECTOMY W/ SCLERAL BUCKLE  01/28/2012   Procedure: PARS PLANA VITRECTOMY WITH LASER FOR MACULAR HOLE;  Surgeon: Sherrie George, MD;  Location: [facility] OR;  Service: Ophthalmology;  Laterality: Right;  25 gauge ppv right eye    SIGMOIDOSCOPY     SKIN BIOPSY Left 10/21/2020   pyogenic granuloma ulcerated    TOTAL KNEE ARTHROPLASTY  2009   Left   TRANSURETHRAL RESECTION OF PROSTATE N/A 03/11/2022   Procedure: TRANSURETHRAL RESECTION OF THE PROSTATE (TURP);  Surgeon: Sebastian Ache, MD;  Location: [facility] ORS;  Service: Urology;  Laterality: N/A;  65 MINS   Family History  Problem Relation Age of Onset   Cancer Mother        ovarian   Diabetes Father    Social History   Socioeconomic History   Marital status: Widowed    Spouse name: Not on file   Number of children: Not on file   Years of education: Not on file   Highest education level: Not on file  Occupational History   Not on file  Tobacco Use   Smoking status: Former    Current packs/day: 0.00    Types: Cigarettes    Quit date: 07/26/1972    Years since quitting: 51.1   Smokeless tobacco: Never  Vaping Use   Vaping status: Never Used  Substance and Sexual Activity   Alcohol use: Yes    Alcohol/week: 3.0 standard drinks of alcohol    Types: 3 Shots of liquor per week    Comment: every day or so   Drug use: No   Sexual activity: Not Currently   Other Topics Concern   Not on file  Social History Narrative   Not on file   Social Determinants of Health   Financial Resource Strain: Low Risk  (08/24/2023)   Overall Financial Resource Strain (CARDIA)    Difficulty of Paying Living Expenses: Not hard at all  Food Insecurity: No Food Insecurity (08/24/2023)   Hunger Vital Sign    Worried About Running Out of Food in the Last Year: Never true    Ran Out of Food in the Last Year: Never true  Transportation Needs: No Transportation  Needs (08/24/2023)   PRAPARE - Administrator, Civil Service (Medical): No    Lack of Transportation (Non-Medical): No  Physical Activity: Inactive (08/24/2023)   Exercise Vital Sign    Days of Exercise per Week: 0 days    Minutes of Exercise per Session: 0 min  Stress: No Stress Concern Present (08/24/2023)   [employer] of Occupational Health - Occupational Stress Questionnaire    Feeling of Stress : Not at all  Social Connections: Moderately Isolated (08/24/2023)   Social Connection and Isolation Panel [NHANES]    Frequency of Communication with Friends and Family: More than three times a week    Frequency of Social Gatherings with Friends and Family: Three times a week    Attends Religious Services: More than 4 times per year    Active Member of Clubs or Organizations: No    Attends Banker Meetings: Never    Marital Status: Widowed    Tobacco Counseling Counseling given: Not Answered   Clinical Intake:  Pre-visit preparation completed: Yes  Pain : No/denies pain     Nutritional Risks: None Diabetes: Yes CBG done?: No Did pt. bring in CBG monitor from home?: No  How often do you need to have someone help you when you read instructions, pamphlets, or other written materials from your doctor or pharmacy?: 1 - Never  Interpreter Needed?: No  Information entered by :: NAllen LPN   Activities of Daily Living    08/24/2023    9:13 AM 09/11/2022     8:50 AM  In your present state of health, do you have any difficulty performing the following activities:  Hearing? 0 0  Vision? 0 0  Difficulty concentrating or making decisions? 0 1  Walking or climbing stairs? 1 1  Comment non ambulatory   Dressing or bathing? 1 0  Doing errands, shopping? 1 1  Preparing Food and eating ? Y N  Using the Toilet? Y N  In the past six months, have you accidently leaked urine? N Y  Do you have problems with loss of bowel control? Y N  Comment incontinence   Managing your Medications? Y Y  Managing your Finances? Y N  Housekeeping or managing your Housekeeping? Y N    Patient Care Team: Ronnald Nian, MD as PCP - General (Family Medicine)  Indicate any recent Medical Services you may have received from other than [facility] providers in the past year (date may be approximate).     Assessment:   This is a routine wellness examination for Jashun.  Hearing/Vision screen Hearing Screening - Comments:: Denies hearing issues Vision Screening - Comments:: No regular eye exams,   Goals Addressed             This Visit's Progress    Patient Stated       08/24/2023, denies goals       Depression Screen    08/24/2023    9:19 AM 09/11/2022    8:49 AM 07/25/2021   12:08 PM 07/15/2021    9:22 AM 07/09/2020    2:03 PM 07/03/2019    2:03 PM 04/18/2018    9:23 AM  PHQ 2/9 Scores  PHQ - 2 Score 0 0 0 0 0 0 0  PHQ- 9 Score  1         Fall Risk    08/24/2023    9:18 AM 01/14/2023   11:41 AM 09/11/2022    8:48 AM 07/25/2021   12:07  PM 01/28/2021   11:44 AM  Fall Risk   Falls in the past year? 1 1 1 1 1   Comment legs gave out  slipped out of chair lost balance   Number falls in past yr: 1 1 1  0 0  Injury with Fall? 0 0 0 0 1  Risk for fall due to : Impaired mobility;Impaired balance/gait;History of fall(s);Medication side effect History of fall(s);Impaired balance/gait Impaired balance/gait;Impaired mobility;Medication side effect Impaired  balance/gait;Impaired mobility;Medication side effect Impaired mobility  Follow up Falls prevention discussed;Falls evaluation completed Falls prevention discussed;Falls evaluation completed Falls evaluation completed;Education provided;Falls prevention discussed Falls evaluation completed;Education provided;Falls prevention discussed Falls evaluation completed    MEDICARE RISK AT HOME: Medicare Risk at Home Any stairs in or around the home?: No If so, are there any without handrails?: No Adequate lighting in your home to reduce risk of falls?: Yes Life alert?: No Use of a cane, walker or w/c?: Yes Grab bars in the bathroom?: Yes Shower chair or bench in shower?: Yes Elevated toilet seat or a handicapped toilet?: Yes  TIMED UP AND GO:  Was the test performed?  No    Cognitive Function:        08/24/2023    9:20 AM 09/11/2022    8:51 AM 07/25/2021   12:10 PM  6CIT Screen  What Year? 4 points 4 points 0 points  What month? 0 points 0 points 0 points  What time? 0 points 0 points 3 points  Count back from 20 4 points 4 points 4 points  Months in reverse 4 points 4 points 4 points  Repeat phrase 10 points 10 points 10 points  Total Score 22 points 22 points 21 points    Immunizations Immunization History  Administered Date(s) Administered   Fluad Quad(high Dose 65+) 07/03/2019, 07/09/2020, 07/13/2022   Influenza Split 06/20/2009, 08/24/2011, 07/17/2012   Influenza, High Dose Seasonal PF 07/05/2013, 06/12/2014, 05/14/2015, 10/27/2016, 06/17/2017   PFIZER(Purple Top)SARS-COV-2 Vaccination 11/09/2019, 12/05/2019, 07/09/2020   Pfizer(Comirnaty)Fall Seasonal Vaccine 12 years and older 07/13/2022   Pneumococcal Conjugate-13 11/25/2015   Pneumococcal Polysaccharide-23 01/21/2012   Tdap 01/21/2012    TDAP status: Due, Education has been provided regarding the importance of this vaccine. Advised may receive this vaccine at local pharmacy or Health Dept. Aware to provide a copy of  the vaccination record if obtained from local pharmacy or Health Dept. Verbalized acceptance and understanding.  Flu Vaccine status: Due, Education has been provided regarding the importance of this vaccine. Advised may receive this vaccine at local pharmacy or Health Dept. Aware to provide a copy of the vaccination record if obtained from local pharmacy or Health Dept. Verbalized acceptance and understanding.  Pneumococcal vaccine status: Up to date  Covid-19 vaccine status: Information provided on how to obtain vaccines.   Qualifies for Shingles Vaccine? No   Zostavax completed No   Shingrix Completed?: No.    Education has been provided regarding the importance of this vaccine. Patient has been advised to call insurance company to determine out of pocket expense if they have not yet received this vaccine. Advised may also receive vaccine at local pharmacy or Health Dept. Verbalized acceptance and understanding.  Screening Tests Health Maintenance  Topic Date Due   Zoster Vaccines- Shingrix (1 of 2) Never done   OPHTHALMOLOGY EXAM  07/29/2016   FOOT EXAM  07/09/2021   DTaP/Tdap/Td (2 - Td or Tdap) 01/20/2022   INFLUENZA VACCINE  04/15/2023   COVID-19 Vaccine (5 - 2023-24 season) 05/16/2023  HEMOGLOBIN A1C  07/17/2023   Medicare Annual Wellness (AWV)  08/23/2024   Pneumonia Vaccine 23+ Years old  Completed   HPV VACCINES  Aged Out    Health Maintenance  Health Maintenance Due  Topic Date Due   Zoster Vaccines- Shingrix (1 of 2) Never done   OPHTHALMOLOGY EXAM  07/29/2016   FOOT EXAM  07/09/2021   DTaP/Tdap/Td (2 - Td or Tdap) 01/20/2022   INFLUENZA VACCINE  04/15/2023   COVID-19 Vaccine (5 - 2023-24 season) 05/16/2023   HEMOGLOBIN A1C  07/17/2023    Colorectal cancer screening: No longer required.   Lung Cancer Screening: (Low Dose CT Chest recommended if Age 57-80 years, 20 pack-year currently smoking OR have quit w/in 15years.) does not qualify.   Lung Cancer Screening  Referral: no  Additional Screening:  Hepatitis C Screening: does not qualify;   Vision Screening: Recommended annual ophthalmology exams for early detection of glaucoma and other disorders of the eye. Is the patient up to date with their annual eye exam?  No  Who is the provider or what is the name of the office in which the patient attends annual eye exams? none If pt is not established with a provider, would they like to be referred to a provider to establish care? No .   Dental Screening: Recommended annual dental exams for proper oral hygiene  Diabetic Foot Exam: Diabetic Foot Exam: Overdue, Pt has been advised about the importance in completing this exam. Pt is scheduled for diabetic foot exam on next appointment.  Community Resource Referral / Chronic Care Management: CRR required this visit?  No   CCM required this visit?  No     Plan:     I have personally reviewed and noted the following in the patient's chart:   Medical and social history Use of alcohol, tobacco or illicit drugs  Current medications and supplements including opioid prescriptions. Patient is not currently taking opioid prescriptions. Functional ability and status Nutritional status Physical activity Advanced directives List of other physicians Hospitalizations, surgeries, and ER visits in previous 12 months Vitals Screenings to include cognitive, depression, and falls Referrals and appointments  In addition, I have reviewed and discussed with patient certain preventive protocols, quality metrics, and best practice recommendations. A written personalized care plan for preventive services as well as general preventive health recommendations were provided to patient.     Barb Merino, LPN   15/17/6160   After Visit Summary: (MyChart) Due to this being a telephonic visit, the after visit summary with patients personalized plan was offered to patient via MyChart   Nurse Notes: none

## 2023-08-30 ENCOUNTER — Encounter: Payer: Medicare PPO | Admitting: Physician Assistant

## 2023-08-30 DIAGNOSIS — L89610 Pressure ulcer of right heel, unstageable: Secondary | ICD-10-CM | POA: Diagnosis not present

## 2023-08-30 DIAGNOSIS — I890 Lymphedema, not elsewhere classified: Secondary | ICD-10-CM | POA: Diagnosis not present

## 2023-08-30 DIAGNOSIS — E1151 Type 2 diabetes mellitus with diabetic peripheral angiopathy without gangrene: Secondary | ICD-10-CM | POA: Diagnosis not present

## 2023-08-30 DIAGNOSIS — L89620 Pressure ulcer of left heel, unstageable: Secondary | ICD-10-CM | POA: Diagnosis not present

## 2023-08-30 DIAGNOSIS — I10 Essential (primary) hypertension: Secondary | ICD-10-CM | POA: Diagnosis not present

## 2023-08-30 DIAGNOSIS — E11622 Type 2 diabetes mellitus with other skin ulcer: Secondary | ICD-10-CM | POA: Diagnosis not present

## 2023-08-30 DIAGNOSIS — I872 Venous insufficiency (chronic) (peripheral): Secondary | ICD-10-CM | POA: Diagnosis not present

## 2023-08-30 DIAGNOSIS — Z7984 Long term (current) use of oral hypoglycemic drugs: Secondary | ICD-10-CM | POA: Diagnosis not present

## 2023-08-30 NOTE — Progress Notes (Addendum)
Joe Barry ([id number]) [id number] Page 1 of 9 Visit Report for 08/30/2023 Arrival Information Details Patient Name: Date of Service: Joe Barry [facility] R. 08/30/2023 11:00 A M Medical Record Number: [id number] Patient Account Number: [id number] Date of Birth/Sex: Treating RN: [birth year] (89 y.o. [name]) [name] Primary Care Gwin Eagon: [name] Other Clinician: Referring Page Lancon: Treating Matthieu Loftus/Extender: Kallie Edward in Treatment: 5 Visit Information History Since Last Visit Added or deleted any medications: No Patient Arrived: Wheel Chair Any new allergies or adverse reactions: No Arrival Time: 11:21 Had a fall or experienced change in No Accompanied By: daughter activities of daily living that may affect Transfer Assistance: None risk of falls: Secondary Verification Process Completed: Yes Signs or symptoms of abuse/neglect since last visito No Patient Requires Transmission-Based Precautions: No Hospitalized since last visit: No Patient Has Alerts: Yes Implantable device outside of the clinic excluding No Patient Alerts: DIABETIC cellular tissue based products placed in the center since last visit: Has Dressing in Place as Prescribed: Yes Pain Present Now: No Electronic Signature(s) Signed: 08/30/2023 3:34:19 PM By: [name] RN Entered By: [name] on 08/30/2023 11:25:17 -------------------------------------------------------------------------------- Clinic Level of Care Assessment Details Patient Name: Date of Service: Joe Barry [facility] R. 08/30/2023 11:00 A M Medical Record Number: [id number] Patient Account Number: [id number] Date of Birth/Sex: Treating RN: Jul 15, 1934 (89 y.o. [name]) [name] Primary Care Kavin Weckwerth: [name] Other Clinician: Referring Adrain Butrick: Treating Naveya Ellerman/Extender: Kallie Edward in Treatment: 5 Clinic Level of Care Assessment Items TOOL 1 Quantity Score []  -  0 Use when EandM and Procedure is performed on INITIAL visit ASSESSMENTS - Nursing Assessment / Reassessment []  - 0 General Physical Exam (combine w/ comprehensive assessment (listed just below) when performed on new pt. evals) []  - 0 Comprehensive Assessment (HX, ROS, Risk Assessments, Wounds Hx, etc.) Joe Barry ([id number]) [id number] Page 2 of 9 ASSESSMENTS - Wound and Skin Assessment / Reassessment []  - 0 Dermatologic / Skin Assessment (not related to wound area) ASSESSMENTS - Ostomy and/or Continence Assessment and Care []  - 0 Incontinence Assessment and Management []  - 0 Ostomy Care Assessment and Management (repouching, etc.) PROCESS - Coordination of Care []  - 0 Simple Patient / Family Education for ongoing care []  - 0 Complex (extensive) Patient / Family Education for ongoing care []  - 0 Staff obtains Chiropractor, Records, T Results / Process Orders est []  - 0 Staff telephones HHA, Nursing Homes / Clarify orders / etc []  - 0 Routine Transfer to another Facility (non-emergent condition) []  - 0 Routine Hospital Admission (non-emergent condition) []  - 0 New Admissions / Manufacturing engineer / Ordering NPWT Apligraf, etc. , []  - 0 Emergency Hospital Admission (emergent condition) PROCESS - Special Needs []  - 0 Pediatric / Minor Patient Management []  - 0 Isolation Patient Management []  - 0 Hearing / Language / Visual special needs []  - 0 Assessment of Community assistance (transportation, D/C planning, etc.) []  - 0 Additional assistance / Altered mentation []  - 0 Support Surface(s) Assessment (bed, cushion, seat, etc.) INTERVENTIONS - Miscellaneous []  - 0 External ear exam []  - 0 Patient Transfer (multiple staff / Nurse, adult / Similar devices) []  - 0 Simple Staple / Suture removal (25 or less) []  - 0 Complex Staple / Suture removal (26 or more) []  - 0 Hypo/Hyperglycemic Management (do not check if billed separately) []  -  0 Ankle / Brachial Index (ABI) - do not check if billed separately Has the patient been seen at  the hospital within the last three years: Yes Total Score: 0 Level Of Care: ____ Electronic Signature(s) Signed: 08/30/2023 3:34:19 PM By: [name] RN Entered By: [name] on 08/30/2023 11:45:57 -------------------------------------------------------------------------------- Encounter Discharge Information Details Patient Name: Date of Service: Joe Barry [name] 08/30/2023 11:00 A M Medical Record Number: [id number] Patient Account Number: [id number] Date of Birth/Sex: Treating RN: 03-19-1934 (89 y.o. [name] Primary Care Karmina Zufall: [name] Other Clinician: Referring Lerry Cordrey: Treating Jazmine Longshore/Extender: Kallie Edward in Treatment: 5 Joe Barry ([id number]) [id number] Page 3 of 9 Encounter Discharge Information Items Post Procedure Vitals Discharge Condition: Stable Temperature (F): 97.9 Ambulatory Status: Wheelchair Pulse (bpm): 76 Discharge Destination: Home Respiratory Rate (breaths/min): 18 Transportation: Private Auto Blood Pressure (mmHg): 150/85 Accompanied By: daughter Schedule Follow-up Appointment: Yes Clinical Summary of Care: Electronic Signature(s) Signed: 08/30/2023 3:34:19 PM By: [name] RN Entered By: [name] on 08/30/2023 11:46:55 -------------------------------------------------------------------------------- Lower Extremity Assessment Details Patient Name: Date of Service: Joe Barry [facility] R. 08/30/2023 11:00 A M Medical Record Number: [id number] Patient Account Number: [id number] Date of Birth/Sex: Treating RN: 1933/10/01 (89 y.o. [name]) [name] Primary Care Xcaret Morad: [name] Other Clinician: Referring Khy Pitre: Treating Reya Aurich/Extender: Kallie Edward in Treatment: 5 Electronic Signature(s) Signed: 08/30/2023 3:34:19 PM By: [name] RN Entered By:  [name] on 08/30/2023 11:35:57 -------------------------------------------------------------------------------- Multi Wound Chart Details Patient Name: Date of Service: Joe Barry [name] 08/30/2023 11:00 A M Medical Record Number: [id number] Patient Account Number: [id number] Date of Birth/Sex: Treating RN: 01-18-1934 (89 y.o. [name] Primary Care Reanna Scoggin: [name] Other Clinician: Referring Romon Devereux: Treating Keivon Garden/Extender: Kallie Edward in Treatment: 5 Vital Signs Height(in): 65 Pulse(bpm): 76 Weight(lbs): 175 Blood Pressure(mmHg): 150/85 Body Mass Index(BMI): 29.1 Temperature(F): 97.9 Respiratory Rate(breaths/min): 18 [3:Photos:] AMARIE, SKEMP R ([id number]) [3:Photos:] [[id number]/A:[id number] Page 4 of 9 N/A] Right Calcaneus Left Calcaneus N/A Wound Location: Gradually Appeared Gradually Appeared N/A Wounding Event: Pressure Ulcer Pressure Ulcer N/A Primary Etiology: Cataracts, Lymphedema, Cataracts, Lymphedema, N/A Comorbid History: Hypertension, Peripheral Venous Hypertension, Peripheral Venous Disease, Type II Diabetes Disease, Type II Diabetes 06/28/2023 06/28/2023 N/A Date Acquired: 5 5 N/A Weeks of Treatment: Open Open N/A Wound Status: No No N/A Wound Recurrence: 0.5x0.4x0.3 3x3x0.2 N/A Measurements L x W x D (cm) 0.157 7.069 N/A A (cm) : rea 0.047 1.414 N/A Volume (cm) : 88.10% 35.70% N/A % Reduction in A rea: 95.50% 81.60% N/A % Reduction in Volume: Unstageable/Unclassified Unstageable/Unclassified N/A Classification: Medium Medium N/A Exudate A mount: Serosanguineous Serosanguineous N/A Exudate Type: red, brown red, brown N/A Exudate Color: Small (1-33%) Large (67-100%) N/A Granulation A mount: Pink Pink N/A Granulation Quality: Large (67-100%) Small (1-33%) N/A Necrotic A mount: Eschar, Adherent Slough Adherent Slough N/A Necrotic Tissue: Fat Layer (Subcutaneous Tissue): Yes Fat  Layer (Subcutaneous Tissue): Yes N/A Exposed Structures: Fascia: No Fascia: No Tendon: No Tendon: No Muscle: No Muscle: No Joint: No Joint: No Bone: No Bone: No None None N/A Epithelialization: Treatment Notes Electronic Signature(s) Signed: 08/30/2023 3:34:19 PM By: [name] RN Entered By: [name] on 08/30/2023 11:36:02 -------------------------------------------------------------------------------- Multi-Disciplinary Care Plan Details Patient Name: Date of Service: Joe Barry [name] 08/30/2023 11:00 A M Medical Record Number: [id number] Patient Account Number: [id number] Date of Birth/Sex: Treating RN: [birth year] (89 y.o. [name] Primary Care [name] Swopes: [name] Other Clinician: Referring Loxley Cibrian: Treating Bina Veenstra/Extender: Kallie Edward in Treatment: 5 Active Inactive Wound/Skin Impairment Nursing Diagnoses: Knowledge  deficit related to ulceration/compromised skin integrity MOICES, MORRICE ([id number]) [id number] Page 5 of 9 Goals: Patient/caregiver will verbalize understanding of skin care regimen Date Initiated: 07/22/2023 Target Resolution Date: 09/21/2023 Goal Status: Active Ulcer/skin breakdown will have a volume reduction of 30% by week 4 Date Initiated: 07/22/2023 Date Inactivated: 08/30/2023 Target Resolution Date: 08/21/2023 Goal Status: Unmet Unmet Reason: comorbities Ulcer/skin breakdown will have a volume reduction of 50% by week 8 Date Initiated: 07/22/2023 Target Resolution Date: 09/21/2023 Goal Status: Active Ulcer/skin breakdown will have a volume reduction of 80% by week 12 Date Initiated: 07/22/2023 Target Resolution Date: 10/22/2023 Goal Status: Active Ulcer/skin breakdown will heal within 14 weeks Date Initiated: 07/22/2023 Target Resolution Date: 11/19/2023 Goal Status: Active Interventions: Assess patient/caregiver ability to obtain necessary supplies Assess patient/caregiver  ability to perform ulcer/skin care regimen upon admission and as needed Assess ulceration(s) every visit Notes: Electronic Signature(s) Signed: 08/30/2023 3:34:19 PM By: [name] RN Entered By: [name] on 08/30/2023 11:36:53 -------------------------------------------------------------------------------- Pain Assessment Details Patient Name: Date of Service: Joe Barry [name] 08/30/2023 11:00 A M Medical Record Number: [id number] Patient Account Number: [id number] Date of Birth/Sex: Treating RN: 02-18-1934 (89 y.o. [name] Primary Care Collan Schoenfeld: [name] Other Clinician: Referring Tsugio Elison: Treating Keora Eccleston/Extender: Kallie Edward in Treatment: 5 Active Problems Location of Pain Severity and Description of Pain Patient Has Paino No Site Locations Pain Management and Medication PTOLEMY, SAVANNAH ([id number]) [phone].pdf Page 6 of 9 Current Pain Management: Electronic Signature(s) Signed: 08/30/2023 3:34:19 PM By: [name] RN Entered By: [name] on 08/30/2023 11:26:09 -------------------------------------------------------------------------------- Patient/Caregiver Education Details Patient Name: Date of Service: Delynn Flavin R. 12/16/2024andnbsp11:00 A M Medical Record Number: [id number] Patient Account Number: [id number] Date of Birth/Gender: Treating RN: [birth year] (89 y.o. [name]) [name] Primary Care Physician: [name] Other Clinician: Referring Physician: Treating Physician/Extender: Kallie Edward in Treatment: 5 Education Assessment Education Provided To: Patient Education Topics Provided Wound/Skin Impairment: Handouts: Caring for Your Ulcer Methods: Explain/Verbal Responses: State content correctly Electronic Signature(s) Signed: 08/30/2023 3:34:19 PM By: [name] RN Entered By: [name] on 08/30/2023  11:37:04 -------------------------------------------------------------------------------- Wound Assessment Details Patient Name: Date of Service: Joe Barry [name] 08/30/2023 11:00 A M Medical Record Number: [id number] Patient Account Number: [id number] Date of Birth/Sex: Treating RN: [birth year] (89 y.o. [name] Primary Care Xiong Haidar: [name] Other Clinician: Referring Izabel Chim: Treating Leauna Sharber/Extender: Kallie Edward in Treatment: 5 Wound Status Wound Number: 3 Primary Pressure Ulcer Etiology: Wound Location: Right Calcaneus Wound Open Wounding Event: Gradually RICHARDD, JOYNER ([id number]) [phone].pdf Page 7 of 9 Wounding Event: Gradually Appeared Status: Date Acquired: 06/28/2023 Comorbid Cataracts, Lymphedema, Hypertension, Peripheral Venous Weeks Of Treatment: 5 History: Disease, Type II Diabetes Clustered Wound: No Photos Wound Measurements Length: (cm) 0.5 Width: (cm) 0.4 Depth: (cm) 0.3 Area: (cm) 0.157 Volume: (cm) 0.047 % Reduction in Area: 88.1% % Reduction in Volume: 95.5% Epithelialization: None Tunneling: No Undermining: No Wound Description Classification: Unstageable/Unclassified Exudate Amount: Medium Exudate Type: Serosanguineous Exudate Color: red, brown Foul Odor After Cleansing: No Slough/Fibrino Yes Wound Bed Granulation Amount: Small (1-33%) Exposed Structure Granulation Quality: Pink Fascia Exposed: No Necrotic Amount: Large (67-100%) Fat Layer (Subcutaneous Tissue) Exposed: Yes Necrotic Quality: Eschar, Adherent Slough Tendon Exposed: No Muscle Exposed: No Joint Exposed: No Bone Exposed: No Treatment Notes Wound #3 (Calcaneus) Wound Laterality: Right Cleanser Byram Ancillary Kit - 15 Day Supply Discharge Instruction: Use supplies as instructed; Kit contains: (  15) Saline Bullets; (15) 3x3 Gauze; 15 pr Gloves Soap and Water Discharge Instruction: Gently cleanse wound  with antibacterial soap, rinse and pat dry prior to dressing wounds Peri-Wound Care Topical Primary Dressing Hydrofera Blue Ready Transfer Foam, 2.5x2.5 (in/in) Discharge Instruction: Apply Hydrofera Blue Ready to wound bed as directed Secondary Dressing ABD Pad 5x9 (in/in) Discharge Instruction: Cover with ABD pad Secured With Medipore T - 50M Medipore H Soft Cloth Surgical T ape ape, 2x2 (in/yd) Kerlix Roll Sterile or Non-Sterile 6-ply 4.5x4 (yd/yd) Discharge Instruction: Apply Kerlix as directed Compression Wrap Compression Stockings Add-Ons Electronic Signature(s) TYHIEM, MADEY ([id number]) [id number] Page 8 of 9 Signed: 08/30/2023 3:34:19 PM By: [name] RN Entered By: [name] on 08/30/2023 11:35:20 -------------------------------------------------------------------------------- Wound Assessment Details Patient Name: Date of Service: Joe Barry [facility] R. 08/30/2023 11:00 A M Medical Record Number: [id number] Patient Account Number: [id number] Date of Birth/Sex: Treating RN: 03-04-1934 (89 y.o. [name]) [name] Primary Care Nastasia Kage: [name] Other Clinician: Referring Delisha Peaden: Treating Monae Topping/Extender: Kallie Edward in Treatment: 5 Wound Status Wound Number: 4 Primary Pressure Ulcer Etiology: Wound Location: Left Calcaneus Wound Open Wounding Event: Gradually Appeared Status: Date Acquired: 06/28/2023 Comorbid Cataracts, Lymphedema, Hypertension, Peripheral Venous Weeks Of Treatment: 5 History: Disease, Type II Diabetes Clustered Wound: No Photos Wound Measurements Length: (cm) 3 Width: (cm) 3 Depth: (cm) 0.2 Area: (cm) 7.069 Volume: (cm) 1.414 % Reduction in Area: 35.7% % Reduction in Volume: 81.6% Epithelialization: None Tunneling: No Undermining: No Wound Description Classification: Unstageable/Unclassified Exudate Amount: Medium Exudate Type: Serosanguineous Exudate Color: red, brown Foul  Odor After Cleansing: No Slough/Fibrino Yes Wound Bed Granulation Amount: Large (67-100%) Exposed Structure Granulation Quality: Pink Fascia Exposed: No Necrotic Amount: Small (1-33%) Fat Layer (Subcutaneous Tissue) Exposed: Yes Necrotic Quality: Adherent Slough Tendon Exposed: No Muscle Exposed: No Joint Exposed: No Bone Exposed: No Treatment Notes Wound #4 (Calcaneus) Wound Laterality: Left SAVIEN, HRDLICKA ([id number]) [phone].pdf Page 9 of 9 Cleanser Byram Ancillary Kit - 15 Day Supply Discharge Instruction: Use supplies as instructed; Kit contains: (15) Saline Bullets; (15) 3x3 Gauze; 15 pr Gloves Soap and Water Discharge Instruction: Gently cleanse wound with antibacterial soap, rinse and pat dry prior to dressing wounds Peri-Wound Care Topical Primary Dressing Hydrofera Blue Ready Transfer Foam, 2.5x2.5 (in/in) Discharge Instruction: Apply Hydrofera Blue Ready to wound bed as directed Secondary Dressing ABD Pad 5x9 (in/in) Discharge Instruction: Cover with ABD pad Secured With Medipore T - 50M Medipore H Soft Cloth Surgical T ape ape, 2x2 (in/yd) Kerlix Roll Sterile or Non-Sterile 6-ply 4.5x4 (yd/yd) Discharge Instruction: Apply Kerlix as directed Compression Wrap Compression Stockings Add-Ons Electronic Signature(s) Signed: 08/30/2023 3:34:19 PM By: [name] RN Entered By: [name] on 08/30/2023 11:35:43 -------------------------------------------------------------------------------- Vitals Details Patient Name: Date of Service: Joe Barry [name] 08/30/2023 11:00 A M Medical Record Number: [id number] Patient Account Number: [id number] Date of Birth/Sex: Treating RN: 11/26/1933 (89 y.o. [name]) [name] Primary Care Areyanna Figeroa: [name] Other Clinician: Referring Ulmer Degen: Treating Coraima Tibbs/Extender: Kallie Edward in Treatment: 5 Vital Signs Time Taken: 11:25 Temperature (F): 97.9 Height (in): 65 Pulse  (bpm): 76 Weight (lbs): 175 Respiratory Rate (breaths/min): 18 Body Mass Index (BMI): 29.1 Blood Pressure (mmHg): 150/85 Reference Range: 80 - 120 mg / dl Electronic Signature(s) Signed: 08/30/2023 3:34:19 PM By: [name] RN Entered By: [name] on 08/30/2023 11:25:54

## 2023-08-30 NOTE — Progress Notes (Addendum)
[name], [name] ([id number]) [id number] Page 1 of 10 Visit Report for 08/30/2023 Chief Complaint Document Details Patient Name: Date of Service: Joe Barry [facility] R. 08/30/2023 11:00 A M Medical Record Number: [id number] Patient Account Number: [id number] Date of Birth/Sex: Treating RN: Oct 08, 1933 (89 y.o. Joe Barry) Joe Barry Primary Care Provider: Sharlot Barry Other Clinician: Referring Provider: Treating Provider/Extender: Joe Barry in Treatment: 5 Information Obtained from: Patient Chief Complaint Bilateral Heel Ulcers Electronic Signature(s) Signed: 08/30/2023 10:59:12 AM By: Joe Derry PA-C Entered By: Joe Barry on 08/30/2023 10:59:12 -------------------------------------------------------------------------------- Debridement Details Patient Name: Date of Service: Joe Barry [name] 08/30/2023 11:00 A M Medical Record Number: [id number] Patient Account Number: [id number] Date of Birth/Sex: Treating RN: 09/30/1933 (89 y.o. Joe Barry) Joe Barry Primary Care Provider: Sharlot Barry Other Clinician: Referring Provider: Treating Provider/Extender: Joe Barry in Treatment: 5 Debridement Performed for Assessment: Wound #4 Left Calcaneus Performed By: Physician Joe Derry, PA-C Debridement Type: Debridement Level of Consciousness (Pre-procedure): Awake and Alert Pre-procedure Verification/Time Out Yes - 11:40 Taken: Start Time: 11:40 Percent of Wound Bed Debrided: 100% T Area Debrided (cm): otal 7.06 Tissue and other material debrided: Viable, Non-Viable, Slough, Subcutaneous, Slough, Hyper-granulation Level: Skin/Subcutaneous Tissue Debridement Description: Excisional Instrument: Curette Bleeding: Moderate Hemostasis Achieved: Pressure End Time: 11:49 Procedural Pain: 0 Post Procedural Pain: 0 Response to Treatment: Procedure was tolerated well Joe Barry ([id number])  [id number] Page 2 of 10 Level of Consciousness (Post- Awake and Alert procedure): Post Debridement Measurements of Total Wound Length: (cm) 3 Stage: Unstageable/Unclassified Width: (cm) 3 Depth: (cm) 0.2 Volume: (cm) 1.414 Character of Wound/Ulcer Post Debridement: Improved Post Procedure Diagnosis Same as Pre-procedure Electronic Signature(s) Signed: 08/30/2023 3:34:19 PM By: Joe Pax RN Signed: 08/30/2023 4:20:01 PM By: Joe Derry PA-C Entered By: Joe Barry on 08/30/2023 11:44:44 -------------------------------------------------------------------------------- Debridement Details Patient Name: Date of Service: Joe Barry [name] 08/30/2023 11:00 A M Medical Record Number: [id number] Patient Account Number: [id number] Date of Birth/Sex: Treating RN: 08-Jan-1934 (89 y.o. Joe Barry Primary Care Provider: Sharlot Barry Other Clinician: Referring Provider: Treating Provider/Extender: Joe Barry in Treatment: 5 Debridement Performed for Assessment: Wound #3 Right Calcaneus Performed By: Physician Joe Derry, PA-C Debridement Type: Debridement Level of Consciousness (Pre-procedure): Awake and Alert Pre-procedure Verification/Time Out Yes - 11:40 Taken: Start Time: 11:40 Percent of Wound Bed Debrided: 100% T Area Debrided (cm): otal 0.16 Tissue and other material debrided: Viable, Non-Viable, Slough, Subcutaneous, Slough, Hyper-granulation Level: Skin/Subcutaneous Tissue Debridement Description: Excisional Instrument: Curette Bleeding: Moderate Hemostasis Achieved: Pressure End Time: 11:49 Procedural Pain: 0 Post Procedural Pain: 0 Response to Treatment: Procedure was tolerated well Level of Consciousness (Post- Awake and Alert procedure): Post Debridement Measurements of Total Wound Length: (cm) 0.5 Stage: Unstageable/Unclassified Width: (cm) 0.4 Depth: (cm) 0.3 Volume: (cm) 0.047 Character of Wound/Ulcer  Post Debridement: Improved Post Procedure Diagnosis Same as Pre-procedure Electronic Signature(s) Signed: 08/30/2023 3:34:19 PM By: Joe Pax RN Joe Barry, Joe Barry ([id number]) [id number] Page 3 of 10 Signed: 08/30/2023 4:20:01 PM By: Joe Derry PA-C Entered By: Joe Barry on 08/30/2023 11:45:13 -------------------------------------------------------------------------------- HPI Details Patient Name: Date of Service: Joe Barry [name] 08/30/2023 11:00 A M Medical Record Number: [id number] Patient Account Number: [id number] Date of Birth/Sex: Treating RN: Oct 31, 1933 (89 y.o. Joe Barry Primary Care Provider: Sharlot Barry Other Clinician: Referring Provider: Treating Provider/Extender: Joe Barry in Treatment: 5 History of Present Illness HPI Description: 02-26-2023 upon evaluation today patient presents  for initial inspection here in our clinic concerning issues that he has been having with a wound on his left anterior lower leg. This actually began on 01-14-2023 when the patient was getting out of the car and subsequently they were turning the wheelchair around to move around the door and got too close to the edge of the door and the corner at the bottom actually cut his leg. This because of his lymphedema has been a very hard wound to heal following. Fortunately there does not appear to be any signs of infection systemically which is good news. With that being said even locally I do not see any signs of infection at this point. I do believe that he is making fairly good progress all things considered although I think that we definitely need to clean some this out and get it moving in the right direction. Patient has been using Medihoney or rather his daughter has at home she is a Engineer, civil (consulting) she works in the ICU office seen her previously as well as a patient. Subsequently after it was apparent that this was not getting better they did go on  7 June to [place] [facility] and at that point they actually put the patient on Augmentin which she started on 02-19-2023 and is still continuing that through current. His most recent hemoglobin A1c was 6.9 on 01-16-2023. He did have an x-ray done of the leg but this has not been released yet it still pending a reading. Currently could not tolerate the ABI screening but he seems to have good perfusion in regard to his leg I do not know that we go to gotten a lot of a good reading anyway when it comes to the ABIs even if we were able to do the screening due to the amount of lymphedema that he has with associated swelling. Patient does have a history of lymphedema, diabetes mellitus type 2, he is on oral hypoglycemic agents. He also has a history of hypertension and peripheral vascular disease although his last ABI check in 2018 was normal according to his daughter and he has seen vascular again at that time but has not had any recent ABIs performed. Readmission: 07-22-2023 upon evaluation today patient presents for readmission I previously was taking care of him with regard to wounds on his legs he is doing well with regard to his legs in fact are not even swelling is much as they were in the past. With that being said he unfortunately is having some issues here with his heels bilaterally where there is a pressure injury were not exactly sure where this came from. Fortunately there does not appear to be any signs of active infection locally or systemically which is great news. No fevers, chills, nausea, vomiting, or diarrhea. 07-29-2023 upon evaluation today patient appears to be doing decently well currently in regard to his wound. Has been tolerating the dressing changes without complication. Fortunately I do not see any signs of active infection at this time which is great news. With that being said there is a lot of necrotic tissue and I think that this is what is causing some of the odor on the  left heel. We need to get this cleaned up as quickly as possible. 08-16-2023 upon evaluation today patient appears to be doing well currently in regard to his heel ulcer. Has been tolerating the dressing changes without complication. Fortunately I do not see any signs of active infection at this time which is good news.  No fever chills noted 08-30-2023 upon evaluation today patient appears to be doing well currently in regard to his wounds. He has been tolerating the dressing changes without complication. Fortunately I do not see any signs of active infection at this time. Electronic Signature(s) Signed: 08/30/2023 12:45:46 PM By: Joe Derry PA-C Entered By: Joe Barry on 08/30/2023 12:45:46 Joe Barry ([id number]) [id number] Page 4 of 10 -------------------------------------------------------------------------------- Physical Exam Details Patient Name: Date of Service: Joe Barry [place] R. 08/30/2023 11:00 A M Medical Record Number: [id number] Patient Account Number: [id number] Date of Birth/Sex: Treating RN: [birth year] (89 y.o. Joe Barry) Joe Barry Primary Care Provider: Sharlot Barry Other Clinician: Referring Provider: Treating Provider/Extender: Joe Barry in Treatment: 5 Constitutional Well-nourished and well-hydrated in no acute distress. Respiratory normal breathing without difficulty. Psychiatric this patient is able to make decisions and demonstrates good insight into disease process. Alert and Oriented x 3. pleasant and cooperative. Notes Upon inspection patient's wound bed actually showed signs at both heels of doing better there is some debridement needed on both sides I removed some hypergranulation tissue down to good subcutaneous tissue on the left. On the right I was able to remove slough and biofilm down to the subcutaneous tissue he tolerated both without significant pain which was good news. Electronic  Signature(s) Signed: 08/30/2023 12:46:31 PM By: Joe Derry PA-C Entered By: Joe Barry on 08/30/2023 12:46:31 -------------------------------------------------------------------------------- Physician Orders Details Patient Name: Date of Service: Joe Barry [name] 08/30/2023 11:00 A M Medical Record Number: [id number] Patient Account Number: [id number] Date of Birth/Sex: Treating RN: 1934-03-02 (89 y.o. Joe Barry) Joe Barry Primary Care Provider: Sharlot Barry Other Clinician: Referring Provider: Treating Provider/Extender: Joe Barry in Treatment: 5 The following information was scribed by: Joe Barry The information was scribed for: Joe Barry Verbal / Phone Orders: No Diagnosis Coding ICD-10 Coding Code Description L89.610 Pressure ulcer of right heel, unstageable L89.620 Pressure ulcer of left heel, unstageable I89.0 Lymphedema, not elsewhere classified E11.622 Type 2 diabetes mellitus with other skin ulcer Z79.84 Long term (current) use of oral hypoglycemic drugs Joe Barry, Joe Barry ([id number]) [id number] Page 5 of 10 I10 Essential (primary) hypertension I73.89 Other specified peripheral vascular diseases Follow-up Appointments Return Appointment in 1 week. Bathing/ Shower/ Hygiene May shower with wound dressing protected with water repellent cover or cast protector. Anesthetic (Use 'Patient Medications' Section for Anesthetic Order Entry) Lidocaine applied to wound bed Edema Control - Orders / Instructions Elevate, Exercise Daily and A void Standing for Long Periods of Time. Elevate legs to the level of the heart and pump ankles as often as possible Elevate leg(s) parallel to the floor when sitting. Off-Loading Turn and reposition every 2 hours Wound Treatment Wound #3 - Calcaneus Wound Laterality: Right Cleanser: Byram Ancillary Kit - 15 Day Supply (Generic) 3 x Per Week/30 Days Discharge Instructions: Use supplies as  instructed; Kit contains: (15) Saline Bullets; (15) 3x3 Gauze; 15 pr Gloves Cleanser: Soap and Water 3 x Per Week/30 Days Discharge Instructions: Gently cleanse wound with antibacterial soap, rinse and pat dry prior to dressing wounds Prim Dressing: Hydrofera Blue Ready Transfer Foam, 2.5x2.5 (in/in) 3 x Per Week/30 Days ary Discharge Instructions: Apply Hydrofera Blue Ready to wound bed as directed Secondary Dressing: ABD Pad 5x9 (in/in) (Generic) 3 x Per Week/30 Days Discharge Instructions: Cover with ABD pad Secured With: Medipore T - 61M Medipore H Soft Cloth Surgical T ape ape, 2x2 (in/yd) (Generic) 3 x Per Week/30 Days Secured With: [employer]  Roll Sterile or Non-Sterile 6-ply 4.5x4 (yd/yd) (Generic) 3 x Per Week/30 Days Discharge Instructions: Apply Kerlix as directed Wound #4 - Calcaneus Wound Laterality: Left Cleanser: Byram Ancillary Kit - 15 Day Supply (Generic) 3 x Per Week/30 Days Discharge Instructions: Use supplies as instructed; Kit contains: (15) Saline Bullets; (15) 3x3 Gauze; 15 pr Gloves Cleanser: Soap and Water 3 x Per Week/30 Days Discharge Instructions: Gently cleanse wound with antibacterial soap, rinse and pat dry prior to dressing wounds Prim Dressing: Hydrofera Blue Ready Transfer Foam, 2.5x2.5 (in/in) 3 x Per Week/30 Days ary Discharge Instructions: Apply Hydrofera Blue Ready to wound bed as directed Secondary Dressing: ABD Pad 5x9 (in/in) (Generic) 3 x Per Week/30 Days Discharge Instructions: Cover with ABD pad Secured With: Medipore T - 48M Medipore H Soft Cloth Surgical T ape ape, 2x2 (in/yd) (Generic) 3 x Per Week/30 Days Secured With: [employer] Sterile or Non-Sterile 6-ply 4.5x4 (yd/yd) (Generic) 3 x Per Week/30 Days Discharge Instructions: Apply Kerlix as directed Electronic Signature(s) Signed: 08/30/2023 3:34:19 PM By: Joe Pax RN Signed: 08/30/2023 4:20:01 PM By: Joe Derry PA-C Entered By: Joe Barry on 08/30/2023 11:45:48 Joe Barry, Joe Barry  ([id number]) [id number] Page 6 of 10 -------------------------------------------------------------------------------- Problem List Details Patient Name: Date of Service: Joe Barry [facility] R. 08/30/2023 11:00 A M Medical Record Number: [id number] Patient Account Number: [id number] Date of Birth/Sex: Treating RN: [birth year] (89 y.o. Joe Barry) Joe Barry Primary Care Provider: Sharlot Barry Other Clinician: Referring Provider: Treating Provider/Extender: Joe Barry in Treatment: 5 Active Problems ICD-10 Encounter Code Description Active Date MDM Diagnosis L89.610 Pressure ulcer of right heel, unstageable 07/22/2023 No Yes L89.620 Pressure ulcer of left heel, unstageable 07/22/2023 No Yes I89.0 Lymphedema, not elsewhere classified 07/22/2023 No Yes E11.622 Type 2 diabetes mellitus with other skin ulcer 07/22/2023 No Yes Z79.84 Long term (current) use of oral hypoglycemic drugs 07/22/2023 No Yes I10 Essential (primary) hypertension 07/22/2023 No Yes I73.89 Other specified peripheral vascular diseases 07/22/2023 No Yes Inactive Problems Resolved Problems Electronic Signature(s) Signed: 08/30/2023 10:59:07 AM By: Joe Derry PA-C Entered By: Joe Barry on 08/30/2023 10:59:06 Joe Barry ([id number]) [id number] Page 7 of 10 -------------------------------------------------------------------------------- Progress Note Details Patient Name: Date of Service: Joe Barry [facility] R. 08/30/2023 11:00 A M Medical Record Number: [id number] Patient Account Number: [id number] Date of Birth/Sex: Treating RN: 1934/08/29 (89 y.o. Joe Barry) Joe Barry Primary Care Provider: Sharlot Barry Other Clinician: Referring Provider: Treating Provider/Extender: Joe Barry in Treatment: 5 Subjective Chief Complaint Information obtained from Patient Bilateral Heel Ulcers History of Present Illness (HPI) 02-26-2023 upon evaluation  today patient presents for initial inspection here in our clinic concerning issues that he has been having with a wound on his left anterior lower leg. This actually began on 01-14-2023 when the patient was getting out of the car and subsequently they were turning the wheelchair around to move around the door and got too close to the edge of the door and the corner at the bottom actually cut his leg. This because of his lymphedema has been a very hard wound to heal following. Fortunately there does not appear to be any signs of infection systemically which is good news. With that being said even locally I do not see any signs of infection at this point. I do believe that he is making fairly good progress all things considered although I think that we definitely need to clean some this out and get it moving in the right direction. Patient  has been using Medih[employer] or rather his daughter has at home she is a Engineer, civil (consulting) she works in the ICU office seen her previously as well as a patient. Subsequently after it was apparent that this was not getting better they did go on 7 June to [place] [facility] and at that point they actually put the patient on Augmentin which she started on 02-19-2023 and is still continuing that through current. His most recent hemoglobin A1c was 6.9 on 01-16-2023. He did have an x-ray done of the leg but this has not been released yet it still pending a reading. Currently could not tolerate the ABI screening but he seems to have good perfusion in regard to his leg I do not know that we go to gotten a lot of a good reading anyway when it comes to the ABIs even if we were able to do the screening due to the amount of lymphedema that he has with associated swelling. Patient does have a history of lymphedema, diabetes mellitus type 2, he is on oral hypoglycemic agents. He also has a history of hypertension and peripheral vascular disease although his last ABI check in 2018 was normal  according to his daughter and he has seen vascular again at that time but has not had any recent ABIs performed. Readmission: 07-22-2023 upon evaluation today patient presents for readmission I previously was taking care of him with regard to wounds on his legs he is doing well with regard to his legs in fact are not even swelling is much as they were in the past. With that being said he unfortunately is having some issues here with his heels bilaterally where there is a pressure injury were not exactly sure where this came from. Fortunately there does not appear to be any signs of active infection locally or systemically which is great news. No fevers, chills, nausea, vomiting, or diarrhea. 07-29-2023 upon evaluation today patient appears to be doing decently well currently in regard to his wound. Has been tolerating the dressing changes without complication. Fortunately I do not see any signs of active infection at this time which is great news. With that being said there is a lot of necrotic tissue and I think that this is what is causing some of the odor on the left heel. We need to get this cleaned up as quickly as possible. 08-16-2023 upon evaluation today patient appears to be doing well currently in regard to his heel ulcer. Has been tolerating the dressing changes without complication. Fortunately I do not see any signs of active infection at this time which is good news. No fever chills noted 08-30-2023 upon evaluation today patient appears to be doing well currently in regard to his wounds. He has been tolerating the dressing changes without complication. Fortunately I do not see any signs of active infection at this time. Objective Constitutional Well-nourished and well-hydrated in no acute distress. Vitals Time Taken: 11:25 AM, Height: 65 in, Weight: 175 lbs, BMI: 29.1, Temperature: 97.9 F, Pulse: 76 bpm, Respiratory Rate: 18 breaths/min, Blood Pressure: 150/85 mmHg. Respiratory normal  breathing without difficulty. Psychiatric Joe Barry, Joe Barry ([id number]) [id number] Page 8 of 10 this patient is able to make decisions and demonstrates good insight into disease process. Alert and Oriented x 3. pleasant and cooperative. General Notes: Upon inspection patient's wound bed actually showed signs at both heels of doing better there is some debridement needed on both sides I removed some hypergranulation tissue down to good subcutaneous tissue on the  left. On the right I was able to remove slough and biofilm down to the subcutaneous tissue he tolerated both without significant pain which was good news. Integumentary (Hair, Skin) Wound #3 status is Open. Original cause of wound was Gradually Appeared. The date acquired was: 06/28/2023. The wound has been in treatment 5 weeks. The wound is located on the Right Calcaneus. The wound measures 0.5cm length x 0.4cm width x 0.3cm depth; 0.157cm^2 area and 0.047cm^3 volume. There is Fat Layer (Subcutaneous Tissue) exposed. There is no tunneling or undermining noted. There is a medium amount of serosanguineous drainage noted. There is small (1-33%) pink granulation within the wound bed. There is a large (67-100%) amount of necrotic tissue within the wound bed including Eschar and Adherent Slough. Wound #4 status is Open. Original cause of wound was Gradually Appeared. The date acquired was: 06/28/2023. The wound has been in treatment 5 weeks. The wound is located on the Left Calcaneus. The wound measures 3cm length x 3cm width x 0.2cm depth; 7.069cm^2 area and 1.414cm^3 volume. There is Fat Layer (Subcutaneous Tissue) exposed. There is no tunneling or undermining noted. There is a medium amount of serosanguineous drainage noted. There is large (67-100%) pink granulation within the wound bed. There is a small (1-33%) amount of necrotic tissue within the wound bed including Adherent Slough. Assessment Active  Problems ICD-10 Pressure ulcer of right heel, unstageable Pressure ulcer of left heel, unstageable Lymphedema, not elsewhere classified Type 2 diabetes mellitus with other skin ulcer Long term (current) use of oral hypoglycemic drugs Essential (primary) hypertension Other specified peripheral vascular diseases Procedures Wound #3 Pre-procedure diagnosis of Wound #3 is a Pressure Ulcer located on the Right Calcaneus . There was a Excisional Skin/Subcutaneous Tissue Debridement with a total area of 0.16 sq cm performed by Joe Derry, PA-C. With the following instrument(s): Curette to remove Viable and Non-Viable tissue/material. Material removed includes Subcutaneous Tissue, Slough, and Hyper-granulation. No specimens were taken. A time out was conducted at 11:40, prior to the start of the procedure. A Moderate amount of bleeding was controlled with Pressure. The procedure was tolerated well with a pain level of 0 throughout and a pain level of 0 following the procedure. Post Debridement Measurements: 0.5cm length x 0.4cm width x 0.3cm depth; 0.047cm^3 volume. Post debridement Stage noted as Unstageable/Unclassified. Character of Wound/Ulcer Post Debridement is improved. Post procedure Diagnosis Wound #3: Same as Pre-Procedure Wound #4 Pre-procedure diagnosis of Wound #4 is a Pressure Ulcer located on the Left Calcaneus . There was a Excisional Skin/Subcutaneous Tissue Debridement with a total area of 7.06 sq cm performed by Joe Derry, PA-C. With the following instrument(s): Curette to remove Viable and Non-Viable tissue/material. Material removed includes Subcutaneous Tissue, Slough, and Hyper-granulation. No specimens were taken. A time out was conducted at 11:40, prior to the start of the procedure. A Moderate amount of bleeding was controlled with Pressure. The procedure was tolerated well with a pain level of 0 throughout and a pain level of 0 following the procedure. Post Debridement  Measurements: 3cm length x 3cm width x 0.2cm depth; 1.414cm^3 volume. Post debridement Stage noted as Unstageable/Unclassified. Character of Wound/Ulcer Post Debridement is improved. Post procedure Diagnosis Wound #4: Same as Pre-Procedure Plan Follow-up Appointments: Return Appointment in 1 week. Bathing/ Shower/ Hygiene: May shower with wound dressing protected with water repellent cover or cast protector. Anesthetic (Use 'Patient Medications' Section for Anesthetic Order Entry): Lidocaine applied to wound bed Edema Control - Orders / Instructions: Elevate, Exercise Daily and Avoid  Standing for Long Periods of Time. Elevate legs to the level of the heart and pump ankles as often as possible Elevate leg(s) parallel to the floor when sitting. Off-Loading: Turn and reposition every 2 hours WOUND #3: - Calcaneus Wound Laterality: Right Cleanser: Byram Ancillary Kit - 15 Day Supply (Generic) 3 x Per Week/30 Days Discharge Instructions: Use supplies as instructed; Kit contains: (15) Saline Bullets; (15) 3x3 Gauze; 15 pr Gloves Cleanser: Soap and Water 3 x Per Week/30 Days Discharge Instructions: Gently cleanse wound with antibacterial soap, rinse and pat dry prior to dressing wounds Prim Dressing: Hydrofera Blue Ready Transfer Foam, 2.5x2.5 (in/in) 3 x Per Week/30 Days Joe Barry, Joe Barry ([id number]) [id number] Page 9 of 10 Discharge Instructions: Apply Hydrofera Blue Ready to wound bed as directed Secondary Dressing: ABD Pad 5x9 (in/in) (Generic) 3 x Per Week/30 Days Discharge Instructions: Cover with ABD pad Secured With: Medipore T - 35M Medipore H Soft Cloth Surgical T ape ape, 2x2 (in/yd) (Generic) 3 x Per Week/30 Days Secured With: [employer] Sterile or Non-Sterile 6-ply 4.5x4 (yd/yd) (Generic) 3 x Per Week/30 Days Discharge Instructions: Apply Kerlix as directed WOUND #4: - Calcaneus Wound Laterality: Left Cleanser: [employer] Ancillary Kit - 15 Day Supply  (Generic) 3 x Per Week/30 Days Discharge Instructions: Use supplies as instructed; Kit contains: (15) Saline Bullets; (15) 3x3 Gauze; 15 pr Gloves Cleanser: Soap and Water 3 x Per Week/30 Days Discharge Instructions: Gently cleanse wound with antibacterial soap, rinse and pat dry prior to dressing wounds Prim Dressing: Hydrofera Blue Ready Transfer Foam, 2.5x2.5 (in/in) 3 x Per Week/30 Days ary Discharge Instructions: Apply Hydrofera Blue Ready to wound bed as directed Secondary Dressing: ABD Pad 5x9 (in/in) (Generic) 3 x Per Week/30 Days Discharge Instructions: Cover with ABD pad Secured With: Medipore T - 35M Medipore H Soft Cloth Surgical T ape ape, 2x2 (in/yd) (Generic) 3 x Per Week/30 Days Secured With: [employer] Sterile or Non-Sterile 6-ply 4.5x4 (yd/yd) (Generic) 3 x Per Week/30 Days Discharge Instructions: Apply Kerlix as directed 1. I would recommend based on what we are seeing that we have the patient go to continue to monitor for any signs of infection or worsening. He did have an area of blistering over the distal right toe. There was a little bit of the nail pushing in and I did use the curette just to trim the nail down just to touch in order to keep this from pushing on the area. I think that we will help him feel a lot better and I think it will keep this from developing into an actual wound. 2. I am going to recommend as well that we have the patient continue with the [facility]. 3. I am going to recommend that we continue with ABD pad to cover followed by the roll gauze to secure in place with regard to his wounds. We will see patient back for reevaluation in 1 week here in the clinic. If anything worsens or changes patient will contact our office for additional recommendations. Electronic Signature(s) Signed: 08/30/2023 12:47:18 PM By: Joe Derry PA-C Entered By: Joe Barry on 08/30/2023  12:47:17 -------------------------------------------------------------------------------- SuperBill Details Patient Name: Date of Service: Joe Flavin R. 08/30/2023 Medical Record Number: [id number] Patient Account Number: [id number] Date of Birth/Sex: Treating RN: 07/25/1934 (89 y.o. Joe Barry Primary Care Provider: Sharlot Barry Other Clinician: Referring Provider: Treating Provider/Extender: Joe Barry in Treatment: 5 Diagnosis Coding ICD-10 Codes Code Description L89.610 Pressure ulcer of  right heel, unstageable L89.620 Pressure ulcer of left heel, unstageable I89.0 Lymphedema, not elsewhere classified E11.622 Type 2 diabetes mellitus with other skin ulcer Z79.84 Long term (current) use of oral hypoglycemic drugs I10 Essential (primary) hypertension I73.89 Other specified peripheral vascular diseases Facility Procedures : CARMI, OSTERKAMP Code: [id number] OHN R ([id number]) Description: 11042 - DEB SUBQ TISSUE 20 SQ CM/< ICD-10 Diagnosis Description L89.610 Pressure ulcer of right heel, unstageable L89.620 Pressure ulcer of left heel, unstageable [id number]. Modifier: pdf Page 10 of 10 Quantity: 1 Physician Procedures : CPT4 Code Description Modifier 11042 11042 - WC PHYS SUBQ TISS 20 SQ CM ICD-10 Diagnosis Description L89.610 Pressure ulcer of right heel, unstageable L89.620 Pressure ulcer of left heel, unstageable Quantity: 1 Electronic Signature(s) Signed: 08/30/2023 12:48:22 PM By: Joe Derry PA-C Entered By: Joe Barry on 08/30/2023 12:48:22

## 2023-09-13 ENCOUNTER — Encounter: Payer: Medicare PPO | Admitting: Internal Medicine

## 2023-09-13 ENCOUNTER — Telehealth: Payer: Self-pay | Admitting: Family Medicine

## 2023-09-13 DIAGNOSIS — I890 Lymphedema, not elsewhere classified: Secondary | ICD-10-CM | POA: Diagnosis not present

## 2023-09-13 DIAGNOSIS — E1151 Type 2 diabetes mellitus with diabetic peripheral angiopathy without gangrene: Secondary | ICD-10-CM | POA: Diagnosis not present

## 2023-09-13 DIAGNOSIS — I10 Essential (primary) hypertension: Secondary | ICD-10-CM | POA: Diagnosis not present

## 2023-09-13 DIAGNOSIS — L89620 Pressure ulcer of left heel, unstageable: Secondary | ICD-10-CM | POA: Diagnosis not present

## 2023-09-13 DIAGNOSIS — Z7984 Long term (current) use of oral hypoglycemic drugs: Secondary | ICD-10-CM | POA: Diagnosis not present

## 2023-09-13 DIAGNOSIS — E11622 Type 2 diabetes mellitus with other skin ulcer: Secondary | ICD-10-CM | POA: Diagnosis not present

## 2023-09-13 DIAGNOSIS — L89610 Pressure ulcer of right heel, unstageable: Secondary | ICD-10-CM | POA: Diagnosis not present

## 2023-09-13 DIAGNOSIS — I872 Venous insufficiency (chronic) (peripheral): Secondary | ICD-10-CM | POA: Diagnosis not present

## 2023-09-13 MED ORDER — COLCHICINE 0.6 MG PO TABS: 0.6000 mg | ORAL_TABLET | Freq: Every day | ORAL | 1 refills | Status: DC

## 2023-09-13 NOTE — Telephone Encounter (Signed)
Pt daughter called and is requesting a refill on pt colchicine states pt is having a gout flare up Please send to the [id number] - [employer] [place], [address] - [address] AT [address] [facility] [address]

## 2023-09-13 NOTE — Progress Notes (Signed)
Joe Barry ([id number]) [id number] Page 1 of 6 Visit Report for 09/13/2023 HPI Details Patient Name: Date of Service: Joe Barry [facility] R. 09/13/2023 10:00 A M Medical Record Number: [id number] Patient Account Number: [id number] Date of Birth/Sex: Treating RN: [birth year] (89 y.o. Joe Barry) Joe Barry Primary Care Provider: Sharlot Barry Other Clinician: Betha Barry Referring Provider: Treating Provider/Extender: Joe Barry, Joe Barry Joe Barry in Treatment: 7 History of Present Illness HPI Description: 02-26-2023 upon evaluation today patient presents for initial inspection here in our clinic concerning issues that he has been having with a wound on his left anterior lower leg. This actually began on 01-14-2023 when the patient was getting out of the car and subsequently they were turning the wheelchair around to move around the door and got too close to the edge of the door and the corner at the bottom actually cut his leg. This because of his lymphedema has been a very hard wound to heal following. Fortunately there does not appear to be any signs of infection systemically which is good news. With that being said even locally I do not see any signs of infection at this point. I do believe that he is making fairly good progress all things considered although I think that we definitely need to clean some this out and get it moving in the right direction. Patient has been using Medihoney or rather his daughter has at home she is a Engineer, civil (consulting) she works in the ICU office seen her previously as well as a patient. Subsequently after it was apparent that this was not getting better they did go on 7 June to [place] [facility] and at that point they actually put the patient on Augmentin which she started on 02-19-2023 and is still continuing that through current. His Barry recent hemoglobin A1c was 6.9 on 01-16-2023. He did have an x-ray done of the leg but this has not  been released yet it still pending a reading. Currently could not tolerate the ABI screening but he seems to have good perfusion in regard to his leg I do not know that we go to gotten a lot of a good reading anyway when it comes to the ABIs even if we were able to do the screening due to the amount of lymphedema that he has with associated swelling. Patient does have a history of lymphedema, diabetes mellitus type 2, he is on oral hypoglycemic agents. He also has a history of hypertension and peripheral vascular disease although his last ABI check in 2018 was normal according to his daughter and he has seen vascular again at that time but has not had any recent ABIs performed. Readmission: 07-22-2023 upon evaluation today patient presents for readmission I previously was taking care of him with regard to wounds on his legs he is doing well with regard to his legs in fact are not even swelling is much as they were in the past. With that being said he unfortunately is having some issues here with his heels bilaterally where there is a pressure injury were not exactly sure where this came from. Fortunately there does not appear to be any signs of active infection locally or systemically which is great news. No fevers, chills, nausea, vomiting, or diarrhea. 07-29-2023 upon evaluation today patient appears to be doing decently well currently in regard to his wound. Has been tolerating the dressing changes without complication. Fortunately I do not see any signs of active infection at this time  which is great news. With that being said there is a lot of necrotic tissue and I think that this is what is causing some of the odor on the left heel. We need to get this cleaned up as quickly as possible. 08-16-2023 upon evaluation today patient appears to be doing well currently in regard to his heel ulcer. Has been tolerating the dressing changes without complication. Fortunately I do not see any signs of active  infection at this time which is good news. No fever chills noted 08-30-2023 upon evaluation today patient appears to be doing well currently in regard to his wounds. He has been tolerating the dressing changes without complication. Fortunately I do not see any signs of active infection at this time This patient had what appears to be pressure ulcers on the right lateral heel and the tip of the left heel. The right lateral heel is a slight amount of eschar but it looks closed over today. The left is measuring smaller. We have been using Hydrofera Blue is the primary dressing. The exact cause of this is not really clear. The patient is of limited mobility but his daughter claims she thinks it was the compression wraps we have used for significant edema in the setting of chronic venous insufficiency. He has I think bilateral juxta fifths but he has not been using these now Electronic Signature(s) Signed: 09/13/2023 4:31:08 PM By: Joe Najjar MD Entered By: Joe Barry on 09/13/2023 09:25:24 Joe Barry, Joe Barry ([id number]) [id number] Page 2 of 6 -------------------------------------------------------------------------------- Physical Exam Details Patient Name: Date of Service: Joe Barry [place] R. 09/13/2023 10:00 A M Medical Record Number: [id number] Patient Account Number: [id number] Date of Birth/Sex: Treating RN: [birth year] (89 y.o. Joe Barry) Joe Barry Primary Care Provider: Sharlot Barry Other Clinician: Betha Barry Referring Provider: Treating Provider/Extender: RO BSO N, Joe Barry Hulan Fray, Joe Barry in Treatment: 7 Constitutional Patient is hypertensive.. Pulse regular and within target range for patient.Marland Kitchen Respirations regular, non-labored and within target range.. Temperature is normal and within the target range for the patient.Marland Kitchen appears in no distress. Notes Wound exam; right lateral heel slight eschar but appears to be closed. The left is measuring  smaller somewhat friable but no evidence of infection. The patient has severe bilateral chronic venous insufficiency probably some degree of secondary lymphedema. He did not come with any compression stockings on Electronic Signature(s) Signed: 09/13/2023 4:31:08 PM By: Joe Najjar MD Entered By: Joe Barry on 09/13/2023 09:26:18 -------------------------------------------------------------------------------- Physician Orders Details Patient Name: Date of Service: Joe Barry [name] 09/13/2023 10:00 A M Medical Record Number: [id number] Patient Account Number: [id number] Date of Birth/Sex: Treating RN: 26-Jun-1934 (89 y.o. Joe Barry) Joe Barry Primary Care Provider: Sharlot Barry Other Clinician: Betha Barry Referring Provider: Treating Provider/Extender: RO BSO Dorris Carnes, Joe Barry Joe Barry in Treatment: 7 The following information was scribed by: Joe Barry The information was scribed for: Maxwell Caul Verbal / Phone Orders: No Diagnosis Coding Follow-up Appointments Return Appointment in 1 week. Bathing/ Shower/ Hygiene May shower with wound dressing protected with water repellent cover or cast protector. Anesthetic (Use 'Patient Medications' Section for Anesthetic Order Entry) Lidocaine applied to wound bed Edema Control - Orders / Instructions Elevate, Exercise Daily and A void Standing for Long Periods of Time. Elevate legs to the level of the heart and pump ankles as often as possible Elevate leg(s) parallel to the floor when sitting. ADHARV, SOWINSKI ([id number]) [id number] Page 3 of 6 Off-Loading Turn and reposition  every 2 hours Wound Treatment Wound #4 - Calcaneus Wound Laterality: Left Cleanser: Byram Ancillary Kit - 15 Day Supply (Generic) 3 x Per Week/30 Days Discharge Instructions: Use supplies as instructed; Kit contains: (15) Saline Bullets; (15) 3x3 Gauze; 15 pr Gloves Cleanser: Soap and Water 3 x Per Week/30  Days Discharge Instructions: Gently cleanse wound with antibacterial soap, rinse and pat dry prior to dressing wounds Prim Dressing: Hydrofera Blue Ready Transfer Foam, 2.5x2.5 (in/in) 3 x Per Week/30 Days ary Discharge Instructions: Apply Hydrofera Blue Ready to wound bed as directed Secondary Dressing: ABD Pad 5x9 (in/in) (Generic) 3 x Per Week/30 Days Discharge Instructions: Cover with ABD pad Secured With: Medipore T - 53M Medipore H Soft Cloth Surgical T ape ape, 2x2 (in/yd) (Generic) 3 x Per Week/30 Days Secured With: [employer] Sterile or Non-Sterile 6-ply 4.5x4 (yd/yd) (Generic) 3 x Per Week/30 Days Discharge Instructions: Apply Kerlix as directed Electronic Signature(s) Signed: 09/13/2023 11:29:00 AM By: Joe Barry Signed: 09/13/2023 4:31:08 PM By: Joe Najjar MD Entered By: Joe Barry on 09/13/2023 07:53:15 -------------------------------------------------------------------------------- Problem List Details Patient Name: Date of Service: Joe Barry [name] 09/13/2023 10:00 A M Medical Record Number: [id number] Patient Account Number: [id number] Date of Birth/Sex: Treating RN: [birth year] (89 y.o. [name] Primary Care Provider: Sharlot Barry Other Clinician: Betha Barry Referring Provider: Treating Provider/Extender: RO BSO N, Joe Barry Hulan Fray, Devarious Barry in Treatment: 7 Active Problems ICD-10 Encounter Code Description Active Date MDM Diagnosis L89.610 Pressure ulcer of right heel, unstageable 07/22/2023 No Yes L89.620 Pressure ulcer of left heel, unstageable 07/22/2023 No Yes I89.0 Lymphedema, not elsewhere classified 07/22/2023 No Yes E11.622 Type 2 diabetes mellitus with other skin ulcer 07/22/2023 No Yes Z79.84 Long term (current) use of oral hypoglycemic drugs 07/22/2023 No Yes Joe Barry, Joe Barry ([id number]) [id number] Page 4 of 6 I10 Essential (primary) hypertension 07/22/2023 No Yes I73.89 Other specified peripheral  vascular diseases 07/22/2023 No Yes Inactive Problems Resolved Problems Electronic Signature(s) Signed: 09/13/2023 4:31:08 PM By: Joe Najjar MD Entered By: Joe Barry on 09/13/2023 09:23:31 -------------------------------------------------------------------------------- Progress Note Details Patient Name: Date of Service: Joe Barry [name] 09/13/2023 10:00 A M Medical Record Number: [id number] Patient Account Number: [id number] Date of Birth/Sex: Treating RN: 1933/12/25 (89 y.o. Joe Barry) Joe Barry Primary Care Provider: Sharlot Barry Other Clinician: Betha Barry Referring Provider: Treating Provider/Extender: RO BSO N, Joe Barry Hulan Fray, Ben Barry in Treatment: 7 Subjective History of Present Illness (HPI) 02-26-2023 upon evaluation today patient presents for initial inspection here in our clinic concerning issues that he has been having with a wound on his left anterior lower leg. This actually began on 01-14-2023 when the patient was getting out of the car and subsequently they were turning the wheelchair around to move around the door and got too close to the edge of the door and the corner at the bottom actually cut his leg. This because of his lymphedema has been a very hard wound to heal following. Fortunately there does not appear to be any signs of infection systemically which is good news. With that being said even locally I do not see any signs of infection at this point. I do believe that he is making fairly good progress all things considered although I think that we definitely need to clean some this out and get it moving in the right direction. Patient has been using Medihoney or rather his daughter has at home she is a Engineer, civil (consulting) she works in the ICU office  seen her previously as well as a patient. Subsequently after it was apparent that this was not getting better they did go on 7 June to [place] [facility] and at that point they actually put the patient on  Augmentin which she started on 02-19-2023 and is still continuing that through current. His Barry recent hemoglobin A1c was 6.9 on 01-16-2023. He did have an x-ray done of the leg but this has not been released yet it still pending a reading. Currently could not tolerate the ABI screening but he seems to have good perfusion in regard to his leg I do not know that we go to gotten a lot of a good reading anyway when it comes to the ABIs even if we were able to do the screening due to the amount of lymphedema that he has with associated swelling. Patient does have a history of lymphedema, diabetes mellitus type 2, he is on oral hypoglycemic agents. He also has a history of hypertension and peripheral vascular disease although his last ABI check in 2018 was normal according to his daughter and he has seen vascular again at that time but has not had any recent ABIs performed. Readmission: 07-22-2023 upon evaluation today patient presents for readmission I previously was taking care of him with regard to wounds on his legs he is doing well with regard to his legs in fact are not even swelling is much as they were in the past. With that being said he unfortunately is having some issues here with his heels bilaterally where there is a pressure injury were not exactly sure where this came from. Fortunately there does not appear to be any signs of active infection locally or systemically which is great news. No fevers, chills, nausea, vomiting, or diarrhea. 07-29-2023 upon evaluation today patient appears to be doing decently well currently in regard to his wound. Has been tolerating the dressing changes without complication. Fortunately I do not see any signs of active infection at this time which is great news. With that being said there is a lot of necrotic tissue and I think that this is what is causing some of the odor on the left heel. We need to get this cleaned up as quickly as possible. 08-16-2023 upon  evaluation today patient appears to be doing well currently in regard to his heel ulcer. Has been tolerating the dressing changes without complication. Fortunately I do not see any signs of active infection at this time which is good news. No fever chills noted 08-30-2023 upon evaluation today patient appears to be doing well currently in regard to his wounds. He has been tolerating the dressing changes without complication. Fortunately I do not see any signs of active infection at this time Joe Barry, Joe Barry ([id number]) [phone].pdf Page 5 of 6 This patient had what appears to be pressure ulcers on the right lateral heel and the tip of the left heel. The right lateral heel is a slight amount of eschar but it looks closed over today. The left is measuring smaller. We have been using Hydrofera Blue is the primary dressing. The exact cause of this is not really clear. The patient is of limited mobility but his daughter claims she thinks it was the compression wraps we have used for significant edema in the setting of chronic venous insufficiency. He has I think bilateral juxta fifths but he has not been using these now Objective Constitutional Patient is hypertensive.. Pulse regular and within target range for patient.Marland Kitchen Respirations regular,  non-labored and within target range.. Temperature is normal and within the target range for the patient.Marland Kitchen appears in no distress. Vitals Time Taken: 10:10 AM, Height: 65 in, Weight: 175 lbs, BMI: 29.1, Temperature: 98.3 F, Pulse: 102 bpm, Respiratory Rate: 18 breaths/min, Blood Pressure: 153/87 mmHg. General Notes: Wound exam; right lateral heel slight eschar but appears to be closed. The left is measuring smaller somewhat friable but no evidence of infection. The patient has severe bilateral chronic venous insufficiency probably some degree of secondary lymphedema. He did not come with any compression stockings on Integumentary (Hair,  Skin) Wound #3 status is Healed - Epithelialized. Original cause of wound was Gradually Appeared. The date acquired was: 06/28/2023. The wound has been in treatment 7 [name] The wound is located on the Right Calcaneus. The wound measures 0cm length x 0cm width x 0cm depth; 0cm^2 area and 0cm^3 volume. There is Fat Layer (Subcutaneous Tissue) exposed. There is a none present amount of drainage noted. There is no granulation within the wound bed. There is no necrotic tissue within the wound bed. Wound #4 status is Open. Original cause of wound was Gradually Appeared. The date acquired was: 06/28/2023. The wound has been in treatment 7 [name] The wound is located on the Left Calcaneus. The wound measures 2.3cm length x 2.5cm width x 0.1cm depth; 4.516cm^2 area and 0.452cm^3 volume. There is Fat Layer (Subcutaneous Tissue) exposed. There is a medium amount of serosanguineous drainage noted. There is large (67-100%) red granulation within the wound bed. There is a small (1-33%) amount of necrotic tissue within the wound bed including Adherent Slough. Assessment Active Problems ICD-10 Pressure ulcer of right heel, unstageable Pressure ulcer of left heel, unstageable Lymphedema, not elsewhere classified Type 2 diabetes mellitus with other skin ulcer Long term (current) use of oral hypoglycemic drugs Essential (primary) hypertension Other specified peripheral vascular diseases Plan Follow-up Appointments: Return Appointment in 1 week. Bathing/ Shower/ Hygiene: May shower with wound dressing protected with water repellent cover or cast protector. Anesthetic (Use 'Patient Medications' Section for Anesthetic Order Entry): Lidocaine applied to wound bed Edema Control - Orders / Instructions: Elevate, Exercise Daily and Avoid Standing for Long Periods of Time. Elevate legs to the level of the heart and pump ankles as often as possible Elevate leg(s) parallel to the floor when  sitting. Off-Loading: Turn and reposition every 2 hours WOUND #4: - Calcaneus Wound Laterality: Left Cleanser: Byram Ancillary Kit - 15 Day Supply (Generic) 3 x Per Week/30 Days Discharge Instructions: Use supplies as instructed; Kit contains: (15) Saline Bullets; (15) 3x3 Gauze; 15 pr Gloves Cleanser: Soap and Water 3 x Per Week/30 Days Discharge Instructions: Gently cleanse wound with antibacterial soap, rinse and pat dry prior to dressing wounds Prim Dressing: Hydrofera Blue Ready Transfer Foam, 2.5x2.5 (in/in) 3 x Per Week/30 Days ary Discharge Instructions: Apply Hydrofera Blue Ready to wound bed as directed Secondary Dressing: ABD Pad 5x9 (in/in) (Generic) 3 x Per Week/30 Days Discharge Instructions: Cover with ABD pad Secured With: Medipore T - 44M Medipore H Soft Cloth Surgical T ape ape, 2x2 (in/yd) (Generic) 3 x Per Week/30 Days Secured With: [employer] Sterile or Non-Sterile 6-ply 4.5x4 (yd/yd) (Generic) 3 x Per Week/30 Days Discharge Instructions: Apply Mariah Milling as directed Joe Barry, Joe Barry ([id number]) [phone].pdf Page 6 of 6 1. The right heel is essentially closed 2. Left heel improved. 3. No reason to change the primary dressing which is Hydrofera Blue/ABDs and kerlix 4. They are not using the juxta fit's. He has  uncontrolled edema in both lower legs. They family seems convinced that this caused the pressure ulcers on the heels I did not extensively argue that point Electronic Signature(s) Signed: 09/13/2023 4:31:08 PM By: Joe Najjar MD Entered By: Joe Barry on 09/13/2023 09:29:08 -------------------------------------------------------------------------------- SuperBill Details Patient Name: Date of Service: Joe Flavin R. 09/13/2023 Medical Record Number: [id number] Patient Account Number: [id number] Date of Birth/Sex: Treating RN: [birth year] (89 y.o. Joe Barry) Joe Barry Primary Care Provider: Sharlot Barry Other Clinician: Betha Barry Referring Provider: Treating Provider/Extender: RO BSO N, Joe Barry Hulan Fray, Arrick Barry in Treatment: 7 Diagnosis Coding ICD-10 Codes Code Description L89.610 Pressure ulcer of right heel, unstageable L89.620 Pressure ulcer of left heel, unstageable I89.0 Lymphedema, not elsewhere classified E11.622 Type 2 diabetes mellitus with other skin ulcer Z79.84 Long term (current) use of oral hypoglycemic drugs I10 Essential (primary) hypertension I73.89 Other specified peripheral vascular diseases Facility Procedures : CPT4 Code: [id number] Description: 99213 - WOUND CARE VISIT-LEV 3 EST PT Modifier: Quantity: 1 Physician Procedures : CPT4 Code Description Modifier [id number] 99213 - WC PHYS LEVEL 3 - EST PT ICD-10 Diagnosis Description L89.610 Pressure ulcer of right heel, unstageable L89.620 Pressure ulcer of left heel, unstageable I89.0 Lymphedema, not elsewhere classified Quantity: 1 Electronic Signature(s) Signed: 09/13/2023 4:31:08 PM By: Joe Najjar MD Previous Signature: 09/13/2023 11:29:00 AM Version By: Joe Barry Entered By: Joe Barry on 09/13/2023 09:29:35

## 2023-09-13 NOTE — Progress Notes (Signed)
POSIE, JEFCOAT ([id number]) [id number] Page 1 of 11 Visit Report for 09/13/2023 Arrival Information Details Patient Name: Date of Service: Joe Barry [facility] R. 09/13/2023 10:00 A M Medical Record Number: [id number] Patient Account Number: [id number] Date of Birth/Sex: Treating RN: June 02, 1934 (89 y.o. [name]) [name] Primary Care Gregori Abril: [name] Other Clinician: Betha Loa Referring Kiegan Macaraeg: Treating Jyasia Markoff/Extender: RO BSO Dorris Carnes, MICHA EL [name] in Treatment: 7 Visit Information History Since Last Visit All ordered tests and consults were completed: No Patient Arrived: Wheel Chair Added or deleted any medications: No Arrival Time: 10:01 Any new allergies or adverse reactions: No Transfer Assistance: EasyPivot Patient Lift Had a fall or experienced change in No Patient Identification Verified: Yes activities of daily living that may affect Secondary Verification Process Completed: Yes risk of falls: Patient Requires Transmission-Based Precautions: No Signs or symptoms of abuse/neglect since last visito No Patient Has Alerts: Yes Hospitalized since last visit: No Patient Alerts: DIABETIC Implantable device outside of the clinic excluding No cellular tissue based products placed in the center since last visit: Has Dressing in Place as Prescribed: Yes Has Compression in Place as Prescribed: Yes Pain Present Now: Yes Electronic Signature(s) Signed: 09/13/2023 11:29:00 AM By: Betha Loa Entered By: Betha Loa on 09/13/2023 07:07:27 -------------------------------------------------------------------------------- Clinic Level of Care Assessment Details Patient Name: Date of Service: Joe Barry [facility] R. 09/13/2023 10:00 A M Medical Record Number: [id number] Patient Account Number: [id number] Date of Birth/Sex: Treating RN: Dec 09, 1933 (89 y.o. [name]) [name] Primary Care Lavanya Roa: [name] Other Clinician: Betha Loa Referring Tejay Hubert: Treating Londen Bok/Extender: RO BSO N, MICHA EL Hulan Fray, Donyale Weeks in Treatment: 7 Clinic Level of Care Assessment Items TOOL 4 Quantity Score []  - 0 Use when only an EandM is performed on FOLLOW-UP visit ASSESSMENTS - Nursing Assessment / Reassessment X- 1 10 Reassessment of Co-morbidities (includes updates in patient status) KHALFANI, COBER ([id number]) [phone].pdf Page 2 of 11 X- 1 5 Reassessment of Adherence to Treatment Plan ASSESSMENTS - Wound and Skin A ssessment / Reassessment X - Simple Wound Assessment / Reassessment - one wound 1 5 []  - 0 Complex Wound Assessment / Reassessment - multiple wounds []  - 0 Dermatologic / Skin Assessment (not related to wound area) ASSESSMENTS - Focused Assessment []  - 0 Circumferential Edema Measurements - multi extremities []  - 0 Nutritional Assessment / Counseling / Intervention []  - 0 Lower Extremity Assessment (monofilament, tuning fork, pulses) []  - 0 Peripheral Arterial Disease Assessment (using hand held doppler) ASSESSMENTS - Ostomy and/or Continence Assessment and Care []  - 0 Incontinence Assessment and Management []  - 0 Ostomy Care Assessment and Management (repouching, etc.) PROCESS - Coordination of Care X - Simple Patient / Family Education for ongoing care 1 15 []  - 0 Complex (extensive) Patient / Family Education for ongoing care []  - 0 Staff obtains Chiropractor, Records, T Results / Process Orders est []  - 0 Staff telephones HHA, Nursing Homes / Clarify orders / etc []  - 0 Routine Transfer to another Facility (non-emergent condition) []  - 0 Routine Hospital Admission (non-emergent condition) []  - 0 New Admissions / Manufacturing engineer / Ordering NPWT Apligraf, etc. , []  - 0 Emergency Hospital Admission (emergent condition) X- 1 10 Simple Discharge Coordination []  - 0 Complex (extensive) Discharge Coordination PROCESS - Special Needs []  - 0 Pediatric  / Minor Patient Management []  - 0 Isolation Patient Management []  - 0 Hearing / Language / Visual special needs []  - 0 Assessment of Community  assistance (transportation, D/C planning, etc.) []  - 0 Additional assistance / Altered mentation []  - 0 Support Surface(s) Assessment (bed, cushion, seat, etc.) INTERVENTIONS - Wound Cleansing / Measurement X - Simple Wound Cleansing - one wound 1 5 []  - 0 Complex Wound Cleansing - multiple wounds X- 1 5 Wound Imaging (photographs - any number of wounds) []  - 0 Wound Tracing (instead of photographs) X- 1 5 Simple Wound Measurement - one wound []  - 0 Complex Wound Measurement - multiple wounds INTERVENTIONS - Wound Dressings []  - 0 Small Wound Dressing one or multiple wounds X- 1 15 Medium Wound Dressing one or multiple wounds []  - 0 Large Wound Dressing one or multiple wounds []  - 0 Application of Medications - topical []  - 0 Application of Medications - injection INTERVENTIONS - Miscellaneous KINGSTON, MEEHAN ([id number]) [phone].pdf Page 3 of 11 []  - 0 External ear exam []  - 0 Specimen Collection (cultures, biopsies, blood, body fluids, etc.) []  - 0 Specimen(s) / Culture(s) sent or taken to Lab for analysis []  - 0 Patient Transfer (multiple staff / Michiel Sites Lift / Similar devices) []  - 0 Simple Staple / Suture removal (25 or less) []  - 0 Complex Staple / Suture removal (26 or more) []  - 0 Hypo / Hyperglycemic Management (close monitor of Blood Glucose) []  - 0 Ankle / Brachial Index (ABI) - do not check if billed separately X- 1 5 Vital Signs Has the patient been seen at the hospital within the last three years: Yes Total Score: 80 Level Of Care: New/Established - Level 3 Electronic Signature(s) Signed: 09/13/2023 11:29:00 AM By: Betha Loa Entered By: Betha Loa on 09/13/2023 07:54:19 -------------------------------------------------------------------------------- Encounter Discharge  Information Details Patient Name: Date of Service: Joe Barry [name] 09/13/2023 10:00 A M Medical Record Number: [id number] Patient Account Number: [id number] Date of Birth/Sex: Treating RN: [birth year] (89 y.o. [name] Primary Care Kaizer Dissinger: [name] Other Clinician: Betha Loa Referring Brea Coleson: Treating Caine Barfield/Extender: RO BSO N, MICHA EL [name] in Treatment: 7 Encounter Discharge Information Items Discharge Condition: Stable Ambulatory Status: Wheelchair Discharge Destination: Home Transportation: Private Auto Accompanied By: daughter Schedule Follow-up Appointment: Yes Clinical Summary of Care: Electronic Signature(s) Signed: 09/13/2023 11:29:00 AM By: Betha Loa Entered By: Betha Loa on 09/13/2023 08:28:41 [name] ([id number]) [id number] Page 4 of 11 -------------------------------------------------------------------------------- Lower Extremity Assessment Details Patient Name: Date of Service: Joe Barry [place] R. 09/13/2023 10:00 A M Medical Record Number: [id number] Patient Account Number: [id number] Date of Birth/Sex: Treating RN: 06/02/1934 (89 y.o. [name]) [name] Primary Care Gerrard Crystal: [name] Other Clinician: Betha Loa Referring Dalicia Kisner: Treating Stefana Lodico/Extender: RO BSO Dorris Carnes, MICHA EL Hulan Fray, Carlos Weeks in Treatment: 7 Edema Assessment Assessed: [Left: Yes] [Right: Yes] Edema: [Left: Yes] [Right: Yes] Calf Left: Right: Point of Measurement: From Medial Instep 40.5 cm 41.3 cm Ankle Left: Right: Point of Measurement: From Medial Instep 31 cm 28 cm Vascular Assessment Pulses: Dorsalis Pedis Palpable: [Left:Yes] [Right:Yes] Extremity colors, hair growth, and conditions: Extremity Color: [Left:Normal] [Right:Normal] Hair Growth on Extremity: [Left:Yes] [Right:Yes] Temperature of Extremity: [Left:Warm > 3 seconds] [Right:Warm > 3 seconds] Toe Nail Assessment Left:  Right: Thick: Yes Yes Discolored: No No Deformed: No No Improper Length and Hygiene: No No Electronic Signature(s) Signed: 09/13/2023 11:29:00 AM By: Betha Loa Signed: 09/13/2023 3:37:08 PM By: [name] RN Entered By: Betha Loa on 09/13/2023 07:22:33 -------------------------------------------------------------------------------- Multi Wound Chart Details Patient Name: Date of Service: Joe Barry [name] 09/13/2023  10:00 A M Medical Record Number: [id number] Patient Account Number: [id number] Date of Birth/Sex: Treating RN: September 08, 1934 (89 y.o. [name]) [name] Primary Care Dorance Spink: [name] Other Clinician: Betha Loa Referring Jahziel Sinn: Treating Bonita Brindisi/Extender: RO BSO N, MICHA EL Hulan Fray, Thadius Weeks in Treatment: 7 Vital Signs Height(in): 65 Pulse(bpm): 102 Weight(lbs): 175 Blood Pressure(mmHg): 153/87 Body Mass Index(BMI): 29.1 Temperature(F): 98.3 IWAN, AMAN R ([id number]) [phone].pdf Page 5 of 11 Respiratory Rate(breaths/min): 18 [3:Photos:] [N/A:N/A] Right Calcaneus Left Calcaneus N/A Wound Location: Gradually Appeared Gradually Appeared N/A Wounding Event: Pressure Ulcer Pressure Ulcer N/A Primary Etiology: Cataracts, Lymphedema, Cataracts, Lymphedema, N/A Comorbid History: Hypertension, Peripheral Venous Hypertension, Peripheral Venous Disease, Type II Diabetes Disease, Type II Diabetes 06/28/2023 06/28/2023 N/A Date Acquired: 7 7 N/A Weeks of Treatment: Open Open N/A Wound Status: No No N/A Wound Recurrence: 0.1x0.1x0.1 2.3x2.5x0.1 N/A Measurements L x W x D (cm) 0.008 4.516 N/A A (cm) : rea 0.001 0.452 N/A Volume (cm) : 99.40% 58.90% N/A % Reduction in A rea: 99.90% 94.10% N/A % Reduction in Volume: Unstageable/Unclassified Unstageable/Unclassified N/A Classification: None Present Medium N/A Exudate A mount: N/A Serosanguineous N/A Exudate Type: N/A red, brown N/A Exudate Color: None  Present (0%) Large (67-100%) N/A Granulation A mount: N/A Red N/A Granulation Quality: None Present (0%) Small (1-33%) N/A Necrotic A mount: Fat Layer (Subcutaneous Tissue): Yes Fat Layer (Subcutaneous Tissue): Yes N/A Exposed Structures: Fascia: No Fascia: No Tendon: No Tendon: No Muscle: No Muscle: No Joint: No Joint: No Bone: No Bone: No Large (67-100%) None N/A Epithelialization: Treatment Notes Electronic Signature(s) Signed: 09/13/2023 11:29:00 AM By: Betha Loa Entered By: Betha Loa on 09/13/2023 07:22:40 -------------------------------------------------------------------------------- Multi-Disciplinary Care Plan Details Patient Name: Date of Service: Joe Barry [name] 09/13/2023 10:00 A M Medical Record Number: [id number] Patient Account Number: [id number] Date of Birth/Sex: Treating RN: [birth year] (89 y.o. [name]) [name] Primary Care Nhan Qualley: [name] Other Clinician: Betha Loa Referring Trudee Chirino: Treating Brayen Bunn/Extender: Chauncey Mann, MICHA EL Hulan Fray, Dehaven Weeks in Treatment: [address] AB, ELLERBE ([id number]) [id number] Page 6 of 11 Nursing Diagnoses: Knowledge deficit related to ulceration/compromised skin integrity Goals: Patient/caregiver will verbalize understanding of skin care regimen Date Initiated: 07/22/2023 Target Resolution Date: 09/21/2023 Goal Status: Active Ulcer/skin breakdown will have a volume reduction of 30% by week 4 Date Initiated: 07/22/2023 Date Inactivated: 08/30/2023 Target Resolution Date: 08/21/2023 Goal Status: Unmet Unmet Reason: comorbities Ulcer/skin breakdown will have a volume reduction of 50% by week 8 Date Initiated: 07/22/2023 Target Resolution Date: 09/21/2023 Goal Status: Active Ulcer/skin breakdown will have a volume reduction of 80% by week 12 Date Initiated: 07/22/2023 Target Resolution Date: 10/22/2023 Goal Status: Active Ulcer/skin  breakdown will heal within 14 weeks Date Initiated: 07/22/2023 Target Resolution Date: 11/19/2023 Goal Status: Active Interventions: Assess patient/caregiver ability to obtain necessary supplies Assess patient/caregiver ability to perform ulcer/skin care regimen upon admission and as needed Assess ulceration(s) every visit Notes: Electronic Signature(s) Signed: 09/13/2023 11:29:00 AM By: Betha Loa Signed: 09/13/2023 3:37:08 PM By: [name] RN Entered By: Betha Loa on 09/13/2023 07:54:28 -------------------------------------------------------------------------------- Pain Assessment Details Patient Name: Date of Service: Joe Barry [name] 09/13/2023 10:00 A M Medical Record Number: [id number] Patient Account Number: [id number] Date of Birth/Sex: Treating RN: 1934/06/11 (89 y.o. [name] Primary Care Latysha Thackston: [name] Other Clinician: Betha Loa Referring Keniyah Gelinas: Treating Shakemia Madera/Extender: RO BSO N, MICHA EL [name] in Treatment: 7 Active Problems Location of Pain Severity and Description of Pain Patient  Has Paino Yes Site Locations Pain Location: ARLENE, IVERS ([id number]) [id number] Page 7 of 11 Pain Location: Generalized Pain, Pain in Ulcers Duration of the Pain. Constant / Intermittento Constant Rate the pain. Current Pain Level: 1 Character of Pain Describe the Pain: Aching Pain Management and Medication Current Pain Management: Medication: Yes Cold Application: No Rest: No Massage: No Activity: No T.E.N.S.: No Heat Application: No Leg drop or elevation: No Is the Current Pain Management Adequate: Inadequate How does your wound impact your activities of daily livingo Sleep: No Bathing: No Appetite: No Relationship With Others: No Bladder Continence: No Emotions: No Bowel Continence: No Work: No Toileting: No Drive: No Dressing: No Hobbies: No Electronic Signature(s) Signed:  09/13/2023 11:29:00 AM By: Betha Loa Signed: 09/13/2023 3:37:08 PM By: [name] RN Entered By: Betha Loa on 09/13/2023 07:10:56 -------------------------------------------------------------------------------- Patient/Caregiver Education Details Patient Name: Date of Service: TA Adrian Prince R. 12/30/2024andnbsp10:00 A M Medical Record Number: [id number] Patient Account Number: [id number] Date of Birth/Gender: Treating RN: November 16, 1933 (89 y.o. [name] Primary Care Physician: [name] Other Clinician: Betha Loa Referring Physician: Treating Physician/Extender: RO BSO N, MICHA EL [name] in Treatment: 7 Education Assessment Education Provided To: Caregiver daughter Education Topics Provided Wound/Skin Impairment: Handouts: Other: continue wound care as directed JAYLINN, CURBY ([id number]) [phone].pdf Page 8 of 11 Methods: Explain/Verbal Responses: State content correctly Electronic Signature(s) Signed: 09/13/2023 11:29:00 AM By: Betha Loa Entered By: Betha Loa on 09/13/2023 07:54:52 -------------------------------------------------------------------------------- Wound Assessment Details Patient Name: Date of Service: Joe Barry [name] 09/13/2023 10:00 A M Medical Record Number: [id number] Patient Account Number: [id number] Date of Birth/Sex: Treating RN: Jan 27, 1934 (89 y.o. [name]) [name] Primary Care Mata Rowen: [name] Other Clinician: Betha Loa Referring Jaimie Pippins: Treating Shauntea Lok/Extender: RO BSO Dorris Carnes, MICHA EL Hulan Fray, Jaxten Weeks in Treatment: 7 Wound Status Wound Number: 3 Primary Pressure Ulcer Etiology: Wound Location: Right Calcaneus Wound Healed - Epithelialized Wounding Event: Gradually Appeared Status: Date Acquired: 06/28/2023 Comorbid Cataracts, Lymphedema, Hypertension, Peripheral Venous Weeks Of Treatment: 7 History: Disease, Type II Diabetes Clustered Wound:  No Photos Wound Measurements Length: (cm) Width: (cm) Depth: (cm) Area: (cm) Volume: (cm) 0 % Reduction in Area: 100% 0 % Reduction in Volume: 100% 0 Epithelialization: Large (67-100%) 0 0 Wound Description Classification: Unstageable/Unclassified Exudate Amount: None Present Foul Odor After Cleansing: No Slough/Fibrino No Wound Bed Granulation Amount: None Present (0%) Exposed Structure Necrotic Amount: None Present (0%) Fascia Exposed: No Fat Layer (Subcutaneous Tissue) Exposed: Yes Tendon Exposed: No Muscle Exposed: No Joint Exposed: No Bone Exposed: No ZEBEDIAH, VEILLETTE ([id number]) [id number] Page 9 of 11 Treatment Notes Wound #3 (Calcaneus) Wound Laterality: Right Cleanser Peri-Wound Care Topical Primary Dressing Secondary Dressing Secured With Compression Wrap Compression Stockings Add-Ons Electronic Signature(s) Signed: 09/13/2023 11:29:00 AM By: Betha Loa Signed: 09/13/2023 3:37:08 PM By: [name] RN Entered By: Betha Loa on 09/13/2023 07:49:23 -------------------------------------------------------------------------------- Wound Assessment Details Patient Name: Date of Service: Joe Barry [name] 09/13/2023 10:00 A M Medical Record Number: [id number] Patient Account Number: [id number] Date of Birth/Sex: Treating RN: October 19, 1933 (89 y.o. [name]) [name] Primary Care Zhion Pevehouse: [name] Other Clinician: Betha Loa Referring Dody Smartt: Treating Khadijatou Borak/Extender: RO BSO N, MICHA EL Hulan Fray, Deondrick Weeks in Treatment: 7 Wound Status Wound Number: 4 Primary Pressure Ulcer Etiology: Wound Location: Left Calcaneus Wound Open Wounding Event: Gradually Appeared Status: Date Acquired: 06/28/2023 Comorbid Cataracts, Lymphedema, Hypertension, Peripheral Venous Weeks Of Treatment: 7 History: Disease, Type II Diabetes  Clustered Wound: No Photos Wound Measurements Length: (cm) 2.3 Width: (cm) 2.5 Depth: (cm)  0.1 Area: (cm) 4. RAYNEN, FARBMAN R ([id number]) Volume: (cm) % Reduction in Area: 58.9% % Reduction in Volume: 94.1% Epithelialization: None [id number] Page 10 of 11 0.452 Wound Description Classification: Unstageable/Unclassified Exudate Amount: Medium Exudate Type: Serosanguineous Exudate Color: red, brown Foul Odor After Cleansing: No Slough/Fibrino Yes Wound Bed Granulation Amount: Large (67-100%) Exposed Structure Granulation Quality: Red Fascia Exposed: No Necrotic Amount: Small (1-33%) Fat Layer (Subcutaneous Tissue) Exposed: Yes Necrotic Quality: Adherent Slough Tendon Exposed: No Muscle Exposed: No Joint Exposed: No Bone Exposed: No Treatment Notes Wound #4 (Calcaneus) Wound Laterality: Left Cleanser Byram Ancillary Kit - 15 Day Supply Discharge Instruction: Use supplies as instructed; Kit contains: (15) Saline Bullets; (15) 3x3 Gauze; 15 pr Gloves Soap and Water Discharge Instruction: Gently cleanse wound with antibacterial soap, rinse and pat dry prior to dressing wounds Peri-Wound Care Topical Primary Dressing Hydrofera Blue Ready Transfer Foam, 2.5x2.5 (in/in) Discharge Instruction: Apply Hydrofera Blue Ready to wound bed as directed Secondary Dressing ABD Pad 5x9 (in/in) Discharge Instruction: Cover with ABD pad Secured With Medipore T - 59M Medipore H Soft Cloth Surgical T ape ape, 2x2 (in/yd) Kerlix Roll Sterile or Non-Sterile 6-ply 4.5x4 (yd/yd) Discharge Instruction: Apply Kerlix as directed Compression Wrap Compression Stockings Add-Ons Electronic Signature(s) Signed: 09/13/2023 11:29:00 AM By: Betha Loa Signed: 09/13/2023 3:37:08 PM By: [name] RN Entered By: Betha Loa on 09/13/2023 07:19:13 -------------------------------------------------------------------------------- Vitals Details Patient Name: Date of Service: Joe Barry [name] 09/13/2023 10:00 A M Medical Record Number: [id number] Patient  Account Number: [id number] Date of Birth/Sex: Treating RN: 03-13-1934 (89 y.o. [name] Primary Care Gustin Zobrist: [name] Other Clinician: Betha Loa Referring Gor Vestal: Treating Cindee Mclester/Extender: [address] MICHA EL Rorey, Rauf [place], [place] ([id number]) [phone].pdf Page 11 of 11 Weeks in Treatment: 7 Vital Signs Time Taken: 10:10 Temperature (F): 98.3 Height (in): 65 Pulse (bpm): 102 Weight (lbs): 175 Respiratory Rate (breaths/min): 18 Body Mass Index (BMI): 29.1 Blood Pressure (mmHg): 153/87 Reference Range: 80 - 120 mg / dl Electronic Signature(s) Signed: 09/13/2023 11:29:00 AM By: Betha Loa Entered By: Betha Loa on 09/13/2023 07:10:51

## 2023-09-27 ENCOUNTER — Other Ambulatory Visit: Payer: Self-pay | Admitting: Family Medicine

## 2023-09-27 ENCOUNTER — Ambulatory Visit: Payer: Medicare PPO | Admitting: Physician Assistant

## 2023-09-27 DIAGNOSIS — E118 Type 2 diabetes mellitus with unspecified complications: Secondary | ICD-10-CM

## 2023-09-28 DIAGNOSIS — Z7409 Other reduced mobility: Secondary | ICD-10-CM

## 2023-09-28 DIAGNOSIS — S91309A Unspecified open wound, unspecified foot, initial encounter: Secondary | ICD-10-CM

## 2023-09-28 DIAGNOSIS — M79672 Pain in left foot: Secondary | ICD-10-CM

## 2023-10-06 ENCOUNTER — Ambulatory Visit: Payer: Medicare PPO | Admitting: Physician Assistant

## 2023-10-07 ENCOUNTER — Ambulatory Visit: Payer: Medicare PPO | Admitting: Medical

## 2023-10-07 VITALS — BP 110/58 | HR 105

## 2023-10-07 DIAGNOSIS — R531 Weakness: Secondary | ICD-10-CM | POA: Diagnosis not present

## 2023-10-07 DIAGNOSIS — I890 Lymphedema, not elsewhere classified: Secondary | ICD-10-CM | POA: Diagnosis not present

## 2023-10-07 DIAGNOSIS — M2550 Pain in unspecified joint: Secondary | ICD-10-CM | POA: Diagnosis not present

## 2023-10-07 DIAGNOSIS — E1159 Type 2 diabetes mellitus with other circulatory complications: Secondary | ICD-10-CM

## 2023-10-07 DIAGNOSIS — I739 Peripheral vascular disease, unspecified: Secondary | ICD-10-CM

## 2023-10-07 DIAGNOSIS — M25562 Pain in left knee: Secondary | ICD-10-CM

## 2023-10-07 DIAGNOSIS — R54 Age-related physical debility: Secondary | ICD-10-CM | POA: Insufficient documentation

## 2023-10-07 DIAGNOSIS — E1169 Type 2 diabetes mellitus with other specified complication: Secondary | ICD-10-CM | POA: Diagnosis not present

## 2023-10-07 DIAGNOSIS — E118 Type 2 diabetes mellitus with unspecified complications: Secondary | ICD-10-CM

## 2023-10-07 DIAGNOSIS — M549 Dorsalgia, unspecified: Secondary | ICD-10-CM

## 2023-10-07 DIAGNOSIS — M25561 Pain in right knee: Secondary | ICD-10-CM

## 2023-10-07 DIAGNOSIS — M79671 Pain in right foot: Secondary | ICD-10-CM

## 2023-10-07 DIAGNOSIS — G8929 Other chronic pain: Secondary | ICD-10-CM

## 2023-10-07 DIAGNOSIS — Z7409 Other reduced mobility: Secondary | ICD-10-CM

## 2023-10-07 NOTE — Addendum Note (Signed)
Addended by: Herminio Commons A on: 10/07/2023 02:24 PM   Modules accepted: Orders

## 2023-10-07 NOTE — Progress Notes (Signed)
Subjective:  Joe Barry is a 89 y.o. male who presents for Chief Complaint  Patient presents with   decline in health    Decline in health. Would like home health order for OT/ PT     Here for concerns.  Accompanied  by daughter/caregiver today.  Patient Care Team: Ronnald Nian, MD as PCP - General (Family Medicine) Berneice Heinrich Delbert Phenix., MD as Consulting Physician (Urology) Jerald Kief, PA-C as Physician Assistant (Physician Assistant)   Here for several concerns.  He normally sees Dr. Susann Givens.  I have seen him the last few visits regarding leg wounds specifically.  He primarily has been seeing Dr. Susann Givens for routine chronic disease management and primary care.  He is not currently seeing cardiology or any other specialist other than wound clinic  Compliant with medications which were reviewed today  They check blood sugars some, not daily.  Numbers range around 140 fasting  Blood pressures are also monitored periodically at home and generally speaking he runs 110-120 systolic blood pressure.  Pulse normally runs 60-80 at home  He denies chest pain, palpitations, shortness of breath.  He had been having a lot more leg swelling that improved of late.  He has a history of lymphedema.  He has been seeing wound clinic for chronic leg wounds but he is doing much better in that regard  Have questions about whether he needs to go back to vascular for consult  Family is requesting physical therapy and Occupational Therapy.  He requires assistance with pretty much everything.  He needs assistance with bathing, getting in and out of the bed, transferring.  He does not handle his own ADLs.  His daughter and other daughter are his caregivers.  They swap often alternate coverage to help him.  He was ambulating and walking more prior to November 2024 but since dealing with these leg wounds he has not really been ambulating and walking on his own.  He does use wheelchair.  He has a  walker at home but has not really been weightbearing to use the walker  His daughter was curious about whether he should be on prednisone and meloxicam to help with arthritis, movement and also to help his appetite  He does not feel safe to stand or get out of bed on his own.  He does complain a lot about pain in general, pain in hands, knees, back.  Multiple joints.  He notes he has had arthritis and bad knees for a while.  Not currently seeing orthopedics.  He uses Tylenol typically twice a day, has been using some ibuprofen, uses topical remedies such as Voltaren gel, Salonpas and ibuprofen cream  No other aggravating or relieving factors.    No other c/o.    Past Medical History:  Diagnosis Date   AAA (abdominal aortic aneurysm) (HCC)    Arthritis    BPH (benign prostatic hypertrophy)    Cataract    Diabetes mellitus    Dyslipidemia    Hypertension    Dr. Romilda Joy,    Neuromuscular disorder [facility])    carpal tunnel in right wrist   No blood products    Refusal of blood transfusions as patient is Jehovah's Witness    Current Outpatient Medications on File Prior to Visit  Medication Sig Dispense Refill   acetaminophen (TYLENOL) 500 MG tablet Take 500 mg by mouth every 6 (six) hours as needed for moderate pain.     amLODipine (NORVASC) 10 MG tablet  TAKE 1 TABLET(10 MG) BY MOUTH DAILY 90 tablet 1   colchicine 0.6 MG tablet Take 1 tablet (0.6 mg total) by mouth daily. 30 tablet 1   finasteride (PROSCAR) 5 MG tablet Take 5 mg by mouth daily.     gabapentin (NEURONTIN) 100 MG capsule Take 2 capsules (200 mg total) by mouth at bedtime. 60 capsule 2   hydrochlorothiazide (MICROZIDE) 12.5 MG capsule TAKE 1 CAPSULE(12.5 MG) BY MOUTH DAILY 90 capsule 0   ibuprofen (ADVIL) 200 MG tablet Take 200 mg by mouth every 6 (six) hours as needed for mild pain.     JANUVIA 50 MG tablet TAKE 1 TABLET(50 MG) BY MOUTH DAILY. 90 tablet 0   lovastatin (MEVACOR) 40 MG tablet TAKE 1 TABLET(40 MG) BY MOUTH  AT BEDTIME 30 tablet 3   MYRBETRIQ 50 MG TB24 tablet Take 50 mg by mouth daily.     pioglitazone-metformin (ACTOPLUS MET) 15-850 MG tablet TAKE 1 TABLET BY MOUTH TWICE DAILY WITH A MEAL 180 tablet 0   Accu-Chek FastClix Lancets MISC USE AS DIRECTED 102 each 0   blood glucose meter kit and supplies KIT Dispense based on patient and insurance preference. Use up to four times daily as directed. 1 each 0   Blood Glucose Monitoring Suppl (FORA V12 BLOOD GLUCOSE SYSTEM) DEVI Testing once a day     glucose blood (ACCU-CHEK GUIDE) test strip CHECK BLOOD GLUCOSE TWICE DAILY 200 strip 1   Lancet Devices (LITE TOUCH LANCING DEVICE) MISC      No current facility-administered medications on file prior to visit.     The following portions of the patient's history were reviewed and updated as appropriate: allergies, current medications, past family history, past medical history, past social history, past surgical history and problem list.  ROS Otherwise as in subjective above     Objective: BP (!) 110/58   Pulse (!) 105   SpO2 100%   Wt Readings from Last 3 Encounters:  07/05/23 174 lb (78.9 kg)  01/14/23 179 lb (81.2 kg)  09/11/22 170 lb (77.1 kg)   BP Readings from Last 3 Encounters:  10/07/23 (!) 110/58  07/05/23 118/68  02/19/23 120/70    General appearance: alert, no distress, well developed, well nourished Neck: supple, no lymphadenopathy, no thyromegaly, no masses Heart: RRR, normal S1, S2, no murmurs Lungs: CTA bilaterally, no wheezes, rhonchi, or rales Abdomen: +bs, soft, non tender, non distended, no masses, no hepatomegaly, no splenomegaly MSK: Bony arthritic changes of several joints in both hands and knees, limited exam given wheelchair-bound currently and not able to move around a lot.  No obvious joint swelling Pulses: 2+ radial pulses, 1+ pedal pulses Ext: Mild 1+ lower extremity bilateral edema     Assessment: Encounter Diagnoses  Name Primary?   Weakness Yes    Polyarthralgia    Chronic pain of both knees    Chronic back pain, unspecified back location, unspecified back pain laterality    Hyperlipidemia associated with type 2 diabetes mellitus (HCC)    Hypertension associated with diabetes (HCC)    Type 2 diabetes mellitus with complications (HCC)    PVD (peripheral vascular disease) (HCC)    Lymphedema    Impaired mobility    Foot pain, bilateral    Advanced age      Plan: General Weakness and limited mobility We will update some labs today Referral to home health physical therapy for eval and treat I reviewed back of echocardiogram from several years ago that was abnormal.  We discussed possible follow-up with cardiology but they want to hold off on that for now  Chronic pain of both knees, chronic back pain, impaired mobility Discussed pain control regimen.  Advised Tylenol over-the-counter 500 mg or up to 650 mg, do Tylenol twice daily.  Advised against prednisone further question.  This should be only for limited use.  Advised against NSAID but in the grand scheme of things it may be reasonable to use 1 NSAID dose the day such as over-the-counter Aleve once daily alternating with the Tylenol to help ease the pain since his current Tylenol usage is not helping.  Consider adding compounded pain cream.  He is already using over-the-counter creams and not getting a lot of relief. Referral to orthopedics Continue gabapentin 100 mg, 2 capsules daily  Hyperlipidemia Continue on lovastatin 40 mg daily  Hypertension Continue amlodipine 10 mg daily Continue hydrochlorothiazide 12.5 mg daily  Diabetes Continue Actoplus MET 15/850 mg twice daily Continue Januvia 50 mg daily  Continue follow-up with wound clinic for leg wounds that have significantly improved  We discussed his recent few months of recurring leg wounds.  There was a question mark about referral back to vascular surgery.  Given the other issues above family wants to hold off on  that referral right now but we might want to look back into vascular surgery consult going forward   Joe Barry was seen today for decline in health.  Diagnoses and all orders for this visit:  Weakness -     Comprehensive metabolic panel -     CBC with Differential/Platelet -     TSH -     Ambulatory referral to Home Health  Polyarthralgia -     Sedimentation rate -     Uric acid -     AMB referral to orthopedics  Chronic pain of both knees -     Sedimentation rate -     Uric acid -     AMB referral to orthopedics  Chronic back pain, unspecified back location, unspecified back pain laterality -     Sedimentation rate -     Uric acid -     AMB referral to orthopedics  Hyperlipidemia associated with type 2 diabetes mellitus (HCC)  Hypertension associated with diabetes (HCC)  Type 2 diabetes mellitus with complications (HCC) -     Hemoglobin A1c  PVD (peripheral vascular disease) (HCC)  Lymphedema  Impaired mobility -     AMB referral to orthopedics -     Ambulatory referral to Home Health  Foot pain, bilateral -     AMB referral to orthopedics -     Ambulatory referral to Home Health  Advanced age  Spent > 45 minutes face to face with patient in discussion of symptoms, evaluation, plan and recommendations.    Follow-up pending labs and call back

## 2023-10-07 NOTE — Addendum Note (Signed)
Addended by: Herminio Commons A on: 10/07/2023 03:04 PM   Modules accepted: Orders

## 2023-10-07 NOTE — Progress Notes (Signed)
Placed order

## 2023-10-10 DIAGNOSIS — I152 Hypertension secondary to endocrine disorders: Secondary | ICD-10-CM | POA: Diagnosis not present

## 2023-10-10 DIAGNOSIS — G8929 Other chronic pain: Secondary | ICD-10-CM | POA: Diagnosis not present

## 2023-10-10 DIAGNOSIS — E1169 Type 2 diabetes mellitus with other specified complication: Secondary | ICD-10-CM | POA: Diagnosis not present

## 2023-10-10 DIAGNOSIS — E1136 Type 2 diabetes mellitus with diabetic cataract: Secondary | ICD-10-CM | POA: Diagnosis not present

## 2023-10-10 DIAGNOSIS — I714 Abdominal aortic aneurysm, without rupture, unspecified: Secondary | ICD-10-CM | POA: Diagnosis not present

## 2023-10-10 DIAGNOSIS — E785 Hyperlipidemia, unspecified: Secondary | ICD-10-CM | POA: Diagnosis not present

## 2023-10-10 DIAGNOSIS — L89890 Pressure ulcer of other site, unstageable: Secondary | ICD-10-CM | POA: Diagnosis not present

## 2023-10-10 DIAGNOSIS — M25561 Pain in right knee: Secondary | ICD-10-CM | POA: Diagnosis not present

## 2023-10-10 DIAGNOSIS — E1159 Type 2 diabetes mellitus with other circulatory complications: Secondary | ICD-10-CM | POA: Diagnosis not present

## 2023-10-11 ENCOUNTER — Telehealth: Payer: Self-pay | Admitting: Family Medicine

## 2023-10-11 NOTE — Telephone Encounter (Signed)
Rinaldo Cloud called and is asking for verbal orders to continue care on patient for 1 week 5 and 1 month 1. Diagnosis code hypertension. Provided call back number [phone].

## 2023-10-13 DIAGNOSIS — I152 Hypertension secondary to endocrine disorders: Secondary | ICD-10-CM | POA: Diagnosis not present

## 2023-10-13 DIAGNOSIS — G8929 Other chronic pain: Secondary | ICD-10-CM | POA: Diagnosis not present

## 2023-10-13 DIAGNOSIS — E1169 Type 2 diabetes mellitus with other specified complication: Secondary | ICD-10-CM | POA: Diagnosis not present

## 2023-10-13 DIAGNOSIS — E1136 Type 2 diabetes mellitus with diabetic cataract: Secondary | ICD-10-CM | POA: Diagnosis not present

## 2023-10-13 DIAGNOSIS — I714 Abdominal aortic aneurysm, without rupture, unspecified: Secondary | ICD-10-CM | POA: Diagnosis not present

## 2023-10-13 DIAGNOSIS — L89890 Pressure ulcer of other site, unstageable: Secondary | ICD-10-CM | POA: Diagnosis not present

## 2023-10-13 DIAGNOSIS — M25561 Pain in right knee: Secondary | ICD-10-CM | POA: Diagnosis not present

## 2023-10-13 DIAGNOSIS — E785 Hyperlipidemia, unspecified: Secondary | ICD-10-CM | POA: Diagnosis not present

## 2023-10-13 DIAGNOSIS — E1159 Type 2 diabetes mellitus with other circulatory complications: Secondary | ICD-10-CM | POA: Diagnosis not present

## 2023-10-14 ENCOUNTER — Ambulatory Visit: Payer: Medicare PPO | Admitting: Physician Assistant

## 2023-10-14 DIAGNOSIS — I152 Hypertension secondary to endocrine disorders: Secondary | ICD-10-CM | POA: Diagnosis not present

## 2023-10-14 DIAGNOSIS — M25561 Pain in right knee: Secondary | ICD-10-CM | POA: Diagnosis not present

## 2023-10-14 DIAGNOSIS — L89890 Pressure ulcer of other site, unstageable: Secondary | ICD-10-CM | POA: Diagnosis not present

## 2023-10-14 DIAGNOSIS — E1169 Type 2 diabetes mellitus with other specified complication: Secondary | ICD-10-CM | POA: Diagnosis not present

## 2023-10-14 DIAGNOSIS — E1159 Type 2 diabetes mellitus with other circulatory complications: Secondary | ICD-10-CM | POA: Diagnosis not present

## 2023-10-14 DIAGNOSIS — I714 Abdominal aortic aneurysm, without rupture, unspecified: Secondary | ICD-10-CM | POA: Diagnosis not present

## 2023-10-14 DIAGNOSIS — E785 Hyperlipidemia, unspecified: Secondary | ICD-10-CM | POA: Diagnosis not present

## 2023-10-14 DIAGNOSIS — E1136 Type 2 diabetes mellitus with diabetic cataract: Secondary | ICD-10-CM | POA: Diagnosis not present

## 2023-10-14 DIAGNOSIS — G8929 Other chronic pain: Secondary | ICD-10-CM | POA: Diagnosis not present

## 2023-10-15 ENCOUNTER — Encounter: Payer: Medicare PPO | Attending: Physician Assistant | Admitting: Physician Assistant

## 2023-10-15 ENCOUNTER — Encounter: Payer: Self-pay | Admitting: Internal Medicine

## 2023-10-15 DIAGNOSIS — Z7984 Long term (current) use of oral hypoglycemic drugs: Secondary | ICD-10-CM | POA: Diagnosis not present

## 2023-10-15 DIAGNOSIS — E1151 Type 2 diabetes mellitus with diabetic peripheral angiopathy without gangrene: Secondary | ICD-10-CM | POA: Diagnosis not present

## 2023-10-15 DIAGNOSIS — E11622 Type 2 diabetes mellitus with other skin ulcer: Secondary | ICD-10-CM | POA: Diagnosis not present

## 2023-10-15 DIAGNOSIS — I890 Lymphedema, not elsewhere classified: Secondary | ICD-10-CM | POA: Diagnosis not present

## 2023-10-15 DIAGNOSIS — L89610 Pressure ulcer of right heel, unstageable: Secondary | ICD-10-CM | POA: Diagnosis not present

## 2023-10-15 DIAGNOSIS — L89620 Pressure ulcer of left heel, unstageable: Secondary | ICD-10-CM | POA: Insufficient documentation

## 2023-10-15 DIAGNOSIS — I10 Essential (primary) hypertension: Secondary | ICD-10-CM | POA: Insufficient documentation

## 2023-10-19 DIAGNOSIS — I152 Hypertension secondary to endocrine disorders: Secondary | ICD-10-CM | POA: Diagnosis not present

## 2023-10-19 DIAGNOSIS — M25561 Pain in right knee: Secondary | ICD-10-CM | POA: Diagnosis not present

## 2023-10-19 DIAGNOSIS — E1159 Type 2 diabetes mellitus with other circulatory complications: Secondary | ICD-10-CM | POA: Diagnosis not present

## 2023-10-19 DIAGNOSIS — E1136 Type 2 diabetes mellitus with diabetic cataract: Secondary | ICD-10-CM | POA: Diagnosis not present

## 2023-10-19 DIAGNOSIS — I714 Abdominal aortic aneurysm, without rupture, unspecified: Secondary | ICD-10-CM | POA: Diagnosis not present

## 2023-10-19 DIAGNOSIS — E1169 Type 2 diabetes mellitus with other specified complication: Secondary | ICD-10-CM | POA: Diagnosis not present

## 2023-10-19 DIAGNOSIS — G8929 Other chronic pain: Secondary | ICD-10-CM | POA: Diagnosis not present

## 2023-10-19 DIAGNOSIS — E785 Hyperlipidemia, unspecified: Secondary | ICD-10-CM | POA: Diagnosis not present

## 2023-10-19 DIAGNOSIS — L89890 Pressure ulcer of other site, unstageable: Secondary | ICD-10-CM | POA: Diagnosis not present

## 2023-10-20 DIAGNOSIS — L89890 Pressure ulcer of other site, unstageable: Secondary | ICD-10-CM | POA: Diagnosis not present

## 2023-10-20 DIAGNOSIS — I714 Abdominal aortic aneurysm, without rupture, unspecified: Secondary | ICD-10-CM | POA: Diagnosis not present

## 2023-10-20 DIAGNOSIS — G8929 Other chronic pain: Secondary | ICD-10-CM | POA: Diagnosis not present

## 2023-10-20 DIAGNOSIS — E1169 Type 2 diabetes mellitus with other specified complication: Secondary | ICD-10-CM | POA: Diagnosis not present

## 2023-10-20 DIAGNOSIS — I152 Hypertension secondary to endocrine disorders: Secondary | ICD-10-CM | POA: Diagnosis not present

## 2023-10-20 DIAGNOSIS — E1159 Type 2 diabetes mellitus with other circulatory complications: Secondary | ICD-10-CM | POA: Diagnosis not present

## 2023-10-20 DIAGNOSIS — E785 Hyperlipidemia, unspecified: Secondary | ICD-10-CM | POA: Diagnosis not present

## 2023-10-20 DIAGNOSIS — E1136 Type 2 diabetes mellitus with diabetic cataract: Secondary | ICD-10-CM | POA: Diagnosis not present

## 2023-10-20 DIAGNOSIS — M25561 Pain in right knee: Secondary | ICD-10-CM | POA: Diagnosis not present

## 2023-10-25 DIAGNOSIS — M25562 Pain in left knee: Secondary | ICD-10-CM

## 2023-10-25 DIAGNOSIS — M25561 Pain in right knee: Secondary | ICD-10-CM | POA: Diagnosis not present

## 2023-10-25 DIAGNOSIS — E785 Hyperlipidemia, unspecified: Secondary | ICD-10-CM | POA: Diagnosis not present

## 2023-10-25 DIAGNOSIS — L89890 Pressure ulcer of other site, unstageable: Secondary | ICD-10-CM | POA: Diagnosis not present

## 2023-10-25 DIAGNOSIS — E1136 Type 2 diabetes mellitus with diabetic cataract: Secondary | ICD-10-CM | POA: Diagnosis not present

## 2023-10-25 DIAGNOSIS — E1159 Type 2 diabetes mellitus with other circulatory complications: Secondary | ICD-10-CM | POA: Diagnosis not present

## 2023-10-25 DIAGNOSIS — S91302D Unspecified open wound, left foot, subsequent encounter: Secondary | ICD-10-CM

## 2023-10-25 DIAGNOSIS — I152 Hypertension secondary to endocrine disorders: Secondary | ICD-10-CM | POA: Diagnosis not present

## 2023-10-25 DIAGNOSIS — E1169 Type 2 diabetes mellitus with other specified complication: Secondary | ICD-10-CM | POA: Diagnosis not present

## 2023-10-25 DIAGNOSIS — M549 Dorsalgia, unspecified: Secondary | ICD-10-CM

## 2023-10-25 DIAGNOSIS — G8929 Other chronic pain: Secondary | ICD-10-CM | POA: Diagnosis not present

## 2023-10-25 DIAGNOSIS — I714 Abdominal aortic aneurysm, without rupture, unspecified: Secondary | ICD-10-CM | POA: Diagnosis not present

## 2023-10-27 DIAGNOSIS — I152 Hypertension secondary to endocrine disorders: Secondary | ICD-10-CM | POA: Diagnosis not present

## 2023-10-27 DIAGNOSIS — E785 Hyperlipidemia, unspecified: Secondary | ICD-10-CM | POA: Diagnosis not present

## 2023-10-27 DIAGNOSIS — E1169 Type 2 diabetes mellitus with other specified complication: Secondary | ICD-10-CM | POA: Diagnosis not present

## 2023-10-27 DIAGNOSIS — I714 Abdominal aortic aneurysm, without rupture, unspecified: Secondary | ICD-10-CM | POA: Diagnosis not present

## 2023-10-27 DIAGNOSIS — G8929 Other chronic pain: Secondary | ICD-10-CM | POA: Diagnosis not present

## 2023-10-27 DIAGNOSIS — E1136 Type 2 diabetes mellitus with diabetic cataract: Secondary | ICD-10-CM | POA: Diagnosis not present

## 2023-10-27 DIAGNOSIS — L89890 Pressure ulcer of other site, unstageable: Secondary | ICD-10-CM | POA: Diagnosis not present

## 2023-10-27 DIAGNOSIS — E1159 Type 2 diabetes mellitus with other circulatory complications: Secondary | ICD-10-CM | POA: Diagnosis not present

## 2023-10-27 DIAGNOSIS — M25561 Pain in right knee: Secondary | ICD-10-CM | POA: Diagnosis not present

## 2023-10-28 ENCOUNTER — Encounter: Payer: Medicare PPO | Attending: Physician Assistant | Admitting: Physician Assistant

## 2023-10-28 DIAGNOSIS — I890 Lymphedema, not elsewhere classified: Secondary | ICD-10-CM | POA: Insufficient documentation

## 2023-10-28 DIAGNOSIS — L89893 Pressure ulcer of other site, stage 3: Secondary | ICD-10-CM | POA: Diagnosis not present

## 2023-10-28 DIAGNOSIS — I10 Essential (primary) hypertension: Secondary | ICD-10-CM | POA: Diagnosis not present

## 2023-10-28 DIAGNOSIS — Z7984 Long term (current) use of oral hypoglycemic drugs: Secondary | ICD-10-CM | POA: Diagnosis not present

## 2023-10-28 DIAGNOSIS — L89891 Pressure ulcer of other site, stage 1: Secondary | ICD-10-CM | POA: Diagnosis not present

## 2023-10-28 DIAGNOSIS — E1151 Type 2 diabetes mellitus with diabetic peripheral angiopathy without gangrene: Secondary | ICD-10-CM | POA: Insufficient documentation

## 2023-11-01 DIAGNOSIS — L89890 Pressure ulcer of other site, unstageable: Secondary | ICD-10-CM | POA: Diagnosis not present

## 2023-11-01 DIAGNOSIS — E1136 Type 2 diabetes mellitus with diabetic cataract: Secondary | ICD-10-CM | POA: Diagnosis not present

## 2023-11-01 DIAGNOSIS — I714 Abdominal aortic aneurysm, without rupture, unspecified: Secondary | ICD-10-CM | POA: Diagnosis not present

## 2023-11-01 DIAGNOSIS — G8929 Other chronic pain: Secondary | ICD-10-CM | POA: Diagnosis not present

## 2023-11-01 DIAGNOSIS — E785 Hyperlipidemia, unspecified: Secondary | ICD-10-CM | POA: Diagnosis not present

## 2023-11-01 DIAGNOSIS — E1169 Type 2 diabetes mellitus with other specified complication: Secondary | ICD-10-CM | POA: Diagnosis not present

## 2023-11-01 DIAGNOSIS — I152 Hypertension secondary to endocrine disorders: Secondary | ICD-10-CM | POA: Diagnosis not present

## 2023-11-01 DIAGNOSIS — M25561 Pain in right knee: Secondary | ICD-10-CM | POA: Diagnosis not present

## 2023-11-01 DIAGNOSIS — E1159 Type 2 diabetes mellitus with other circulatory complications: Secondary | ICD-10-CM | POA: Diagnosis not present

## 2023-11-05 ENCOUNTER — Other Ambulatory Visit: Payer: Self-pay | Admitting: Medical

## 2023-11-08 DIAGNOSIS — I714 Abdominal aortic aneurysm, without rupture, unspecified: Secondary | ICD-10-CM | POA: Diagnosis not present

## 2023-11-08 DIAGNOSIS — I152 Hypertension secondary to endocrine disorders: Secondary | ICD-10-CM | POA: Diagnosis not present

## 2023-11-08 DIAGNOSIS — E785 Hyperlipidemia, unspecified: Secondary | ICD-10-CM | POA: Diagnosis not present

## 2023-11-08 DIAGNOSIS — E1136 Type 2 diabetes mellitus with diabetic cataract: Secondary | ICD-10-CM | POA: Diagnosis not present

## 2023-11-08 DIAGNOSIS — E1159 Type 2 diabetes mellitus with other circulatory complications: Secondary | ICD-10-CM | POA: Diagnosis not present

## 2023-11-08 DIAGNOSIS — E1169 Type 2 diabetes mellitus with other specified complication: Secondary | ICD-10-CM | POA: Diagnosis not present

## 2023-11-08 DIAGNOSIS — L89890 Pressure ulcer of other site, unstageable: Secondary | ICD-10-CM | POA: Diagnosis not present

## 2023-11-08 DIAGNOSIS — G8929 Other chronic pain: Secondary | ICD-10-CM | POA: Diagnosis not present

## 2023-11-08 DIAGNOSIS — M25561 Pain in right knee: Secondary | ICD-10-CM | POA: Diagnosis not present

## 2023-11-09 ENCOUNTER — Encounter: Payer: Self-pay | Admitting: Internal Medicine

## 2023-11-09 DIAGNOSIS — E1136 Type 2 diabetes mellitus with diabetic cataract: Secondary | ICD-10-CM | POA: Diagnosis not present

## 2023-11-09 DIAGNOSIS — E1169 Type 2 diabetes mellitus with other specified complication: Secondary | ICD-10-CM | POA: Diagnosis not present

## 2023-11-09 DIAGNOSIS — E785 Hyperlipidemia, unspecified: Secondary | ICD-10-CM | POA: Diagnosis not present

## 2023-11-09 DIAGNOSIS — I714 Abdominal aortic aneurysm, without rupture, unspecified: Secondary | ICD-10-CM | POA: Diagnosis not present

## 2023-11-09 DIAGNOSIS — M25561 Pain in right knee: Secondary | ICD-10-CM | POA: Diagnosis not present

## 2023-11-09 DIAGNOSIS — E1159 Type 2 diabetes mellitus with other circulatory complications: Secondary | ICD-10-CM | POA: Diagnosis not present

## 2023-11-09 DIAGNOSIS — L89890 Pressure ulcer of other site, unstageable: Secondary | ICD-10-CM | POA: Diagnosis not present

## 2023-11-09 DIAGNOSIS — G8929 Other chronic pain: Secondary | ICD-10-CM | POA: Diagnosis not present

## 2023-11-09 DIAGNOSIS — I152 Hypertension secondary to endocrine disorders: Secondary | ICD-10-CM | POA: Diagnosis not present

## 2023-11-11 ENCOUNTER — Encounter: Payer: Medicare PPO | Admitting: Physician Assistant

## 2023-11-11 DIAGNOSIS — L89893 Pressure ulcer of other site, stage 3: Secondary | ICD-10-CM | POA: Diagnosis not present

## 2023-11-11 DIAGNOSIS — E1151 Type 2 diabetes mellitus with diabetic peripheral angiopathy without gangrene: Secondary | ICD-10-CM | POA: Diagnosis not present

## 2023-11-11 DIAGNOSIS — I890 Lymphedema, not elsewhere classified: Secondary | ICD-10-CM | POA: Diagnosis not present

## 2023-11-11 DIAGNOSIS — I10 Essential (primary) hypertension: Secondary | ICD-10-CM | POA: Diagnosis not present

## 2023-11-11 DIAGNOSIS — Z7984 Long term (current) use of oral hypoglycemic drugs: Secondary | ICD-10-CM | POA: Diagnosis not present

## 2023-11-12 DIAGNOSIS — E1136 Type 2 diabetes mellitus with diabetic cataract: Secondary | ICD-10-CM | POA: Diagnosis not present

## 2023-11-12 DIAGNOSIS — L89890 Pressure ulcer of other site, unstageable: Secondary | ICD-10-CM | POA: Diagnosis not present

## 2023-11-12 DIAGNOSIS — M25561 Pain in right knee: Secondary | ICD-10-CM | POA: Diagnosis not present

## 2023-11-12 DIAGNOSIS — E1159 Type 2 diabetes mellitus with other circulatory complications: Secondary | ICD-10-CM | POA: Diagnosis not present

## 2023-11-12 DIAGNOSIS — E785 Hyperlipidemia, unspecified: Secondary | ICD-10-CM | POA: Diagnosis not present

## 2023-11-12 DIAGNOSIS — I714 Abdominal aortic aneurysm, without rupture, unspecified: Secondary | ICD-10-CM | POA: Diagnosis not present

## 2023-11-12 DIAGNOSIS — I152 Hypertension secondary to endocrine disorders: Secondary | ICD-10-CM | POA: Diagnosis not present

## 2023-11-12 DIAGNOSIS — G8929 Other chronic pain: Secondary | ICD-10-CM | POA: Diagnosis not present

## 2023-11-12 DIAGNOSIS — E1169 Type 2 diabetes mellitus with other specified complication: Secondary | ICD-10-CM | POA: Diagnosis not present

## 2023-11-15 ENCOUNTER — Other Ambulatory Visit: Payer: Self-pay | Admitting: Family Medicine

## 2023-11-15 DIAGNOSIS — M25561 Pain in right knee: Secondary | ICD-10-CM | POA: Diagnosis not present

## 2023-11-15 DIAGNOSIS — L89890 Pressure ulcer of other site, unstageable: Secondary | ICD-10-CM | POA: Diagnosis not present

## 2023-11-15 DIAGNOSIS — E1169 Type 2 diabetes mellitus with other specified complication: Secondary | ICD-10-CM | POA: Diagnosis not present

## 2023-11-15 DIAGNOSIS — G8929 Other chronic pain: Secondary | ICD-10-CM | POA: Diagnosis not present

## 2023-11-15 DIAGNOSIS — E1159 Type 2 diabetes mellitus with other circulatory complications: Secondary | ICD-10-CM

## 2023-11-15 DIAGNOSIS — I714 Abdominal aortic aneurysm, without rupture, unspecified: Secondary | ICD-10-CM | POA: Diagnosis not present

## 2023-11-15 DIAGNOSIS — E1136 Type 2 diabetes mellitus with diabetic cataract: Secondary | ICD-10-CM | POA: Diagnosis not present

## 2023-11-15 DIAGNOSIS — I152 Hypertension secondary to endocrine disorders: Secondary | ICD-10-CM | POA: Diagnosis not present

## 2023-11-15 DIAGNOSIS — E785 Hyperlipidemia, unspecified: Secondary | ICD-10-CM | POA: Diagnosis not present

## 2023-11-22 DIAGNOSIS — G8929 Other chronic pain: Secondary | ICD-10-CM | POA: Diagnosis not present

## 2023-11-22 DIAGNOSIS — L89890 Pressure ulcer of other site, unstageable: Secondary | ICD-10-CM | POA: Diagnosis not present

## 2023-11-22 DIAGNOSIS — M25561 Pain in right knee: Secondary | ICD-10-CM | POA: Diagnosis not present

## 2023-11-22 DIAGNOSIS — I714 Abdominal aortic aneurysm, without rupture, unspecified: Secondary | ICD-10-CM | POA: Diagnosis not present

## 2023-11-22 DIAGNOSIS — E1136 Type 2 diabetes mellitus with diabetic cataract: Secondary | ICD-10-CM | POA: Diagnosis not present

## 2023-11-22 DIAGNOSIS — E785 Hyperlipidemia, unspecified: Secondary | ICD-10-CM | POA: Diagnosis not present

## 2023-11-22 DIAGNOSIS — I152 Hypertension secondary to endocrine disorders: Secondary | ICD-10-CM | POA: Diagnosis not present

## 2023-11-22 DIAGNOSIS — E1159 Type 2 diabetes mellitus with other circulatory complications: Secondary | ICD-10-CM | POA: Diagnosis not present

## 2023-11-22 DIAGNOSIS — E1169 Type 2 diabetes mellitus with other specified complication: Secondary | ICD-10-CM | POA: Diagnosis not present

## 2023-11-25 ENCOUNTER — Ambulatory Visit: Payer: Medicare PPO | Admitting: Physician Assistant

## 2023-11-26 DIAGNOSIS — E1136 Type 2 diabetes mellitus with diabetic cataract: Secondary | ICD-10-CM | POA: Diagnosis not present

## 2023-11-26 DIAGNOSIS — E1159 Type 2 diabetes mellitus with other circulatory complications: Secondary | ICD-10-CM | POA: Diagnosis not present

## 2023-11-26 DIAGNOSIS — L89890 Pressure ulcer of other site, unstageable: Secondary | ICD-10-CM | POA: Diagnosis not present

## 2023-11-26 DIAGNOSIS — M25561 Pain in right knee: Secondary | ICD-10-CM | POA: Diagnosis not present

## 2023-11-26 DIAGNOSIS — E785 Hyperlipidemia, unspecified: Secondary | ICD-10-CM | POA: Diagnosis not present

## 2023-11-26 DIAGNOSIS — E1169 Type 2 diabetes mellitus with other specified complication: Secondary | ICD-10-CM | POA: Diagnosis not present

## 2023-11-26 DIAGNOSIS — I714 Abdominal aortic aneurysm, without rupture, unspecified: Secondary | ICD-10-CM | POA: Diagnosis not present

## 2023-11-26 DIAGNOSIS — I152 Hypertension secondary to endocrine disorders: Secondary | ICD-10-CM | POA: Diagnosis not present

## 2023-11-26 DIAGNOSIS — G8929 Other chronic pain: Secondary | ICD-10-CM | POA: Diagnosis not present

## 2023-11-29 ENCOUNTER — Telehealth: Payer: Self-pay

## 2023-11-29 NOTE — Telephone Encounter (Signed)
 If ok we can fax it.   Copied from CRM [id number]. Topic: General - Other >> Nov 29, 2023  9:51 AM Emylou G wrote: Reason for CRM: Daughter called..  Need order patient lift, wheelchair, and clinical note.. please fax it [phone].Marland Kitchen Pls contact daughter if need more info.. said she has been trying since October.. [phone]

## 2023-11-30 DIAGNOSIS — E785 Hyperlipidemia, unspecified: Secondary | ICD-10-CM | POA: Diagnosis not present

## 2023-11-30 DIAGNOSIS — L89890 Pressure ulcer of other site, unstageable: Secondary | ICD-10-CM | POA: Diagnosis not present

## 2023-11-30 DIAGNOSIS — G8929 Other chronic pain: Secondary | ICD-10-CM | POA: Diagnosis not present

## 2023-11-30 DIAGNOSIS — E1159 Type 2 diabetes mellitus with other circulatory complications: Secondary | ICD-10-CM | POA: Diagnosis not present

## 2023-11-30 DIAGNOSIS — E1136 Type 2 diabetes mellitus with diabetic cataract: Secondary | ICD-10-CM | POA: Diagnosis not present

## 2023-11-30 DIAGNOSIS — M25561 Pain in right knee: Secondary | ICD-10-CM | POA: Diagnosis not present

## 2023-11-30 DIAGNOSIS — I152 Hypertension secondary to endocrine disorders: Secondary | ICD-10-CM | POA: Diagnosis not present

## 2023-11-30 DIAGNOSIS — I714 Abdominal aortic aneurysm, without rupture, unspecified: Secondary | ICD-10-CM | POA: Diagnosis not present

## 2023-11-30 DIAGNOSIS — E1169 Type 2 diabetes mellitus with other specified complication: Secondary | ICD-10-CM | POA: Diagnosis not present

## 2023-12-02 ENCOUNTER — Encounter: Payer: Medicare PPO | Attending: Physician Assistant | Admitting: Physician Assistant

## 2023-12-02 DIAGNOSIS — I10 Essential (primary) hypertension: Secondary | ICD-10-CM | POA: Insufficient documentation

## 2023-12-02 DIAGNOSIS — Z7984 Long term (current) use of oral hypoglycemic drugs: Secondary | ICD-10-CM | POA: Insufficient documentation

## 2023-12-02 DIAGNOSIS — E11622 Type 2 diabetes mellitus with other skin ulcer: Secondary | ICD-10-CM | POA: Diagnosis not present

## 2023-12-02 DIAGNOSIS — I7389 Other specified peripheral vascular diseases: Secondary | ICD-10-CM | POA: Diagnosis not present

## 2023-12-02 DIAGNOSIS — L89893 Pressure ulcer of other site, stage 3: Secondary | ICD-10-CM | POA: Insufficient documentation

## 2023-12-02 DIAGNOSIS — I890 Lymphedema, not elsewhere classified: Secondary | ICD-10-CM | POA: Insufficient documentation

## 2023-12-15 ENCOUNTER — Other Ambulatory Visit: Payer: Self-pay | Admitting: Family Medicine

## 2023-12-15 ENCOUNTER — Other Ambulatory Visit: Payer: Self-pay | Admitting: Medical

## 2023-12-15 DIAGNOSIS — E118 Type 2 diabetes mellitus with unspecified complications: Secondary | ICD-10-CM

## 2023-12-15 NOTE — Telephone Encounter (Signed)
 I can refill for 30 days but looks like he is due for updated diabetes check. Please call patient to schedule

## 2023-12-15 NOTE — Telephone Encounter (Signed)
 Last apt 01/14/23 with you

## 2023-12-16 NOTE — Telephone Encounter (Signed)
 Appt made

## 2023-12-18 ENCOUNTER — Other Ambulatory Visit: Payer: Self-pay | Admitting: Family Medicine

## 2023-12-18 DIAGNOSIS — E785 Hyperlipidemia, unspecified: Secondary | ICD-10-CM

## 2023-12-23 ENCOUNTER — Ambulatory Visit: Admitting: Physician Assistant

## 2023-12-23 DIAGNOSIS — E1122 Type 2 diabetes mellitus with diabetic chronic kidney disease: Secondary | ICD-10-CM | POA: Diagnosis not present

## 2024-01-06 ENCOUNTER — Encounter: Attending: Physician Assistant | Admitting: Physician Assistant

## 2024-01-06 DIAGNOSIS — Z7984 Long term (current) use of oral hypoglycemic drugs: Secondary | ICD-10-CM | POA: Insufficient documentation

## 2024-01-06 DIAGNOSIS — L89893 Pressure ulcer of other site, stage 3: Secondary | ICD-10-CM | POA: Insufficient documentation

## 2024-01-06 DIAGNOSIS — I10 Essential (primary) hypertension: Secondary | ICD-10-CM | POA: Insufficient documentation

## 2024-01-06 DIAGNOSIS — E1151 Type 2 diabetes mellitus with diabetic peripheral angiopathy without gangrene: Secondary | ICD-10-CM | POA: Insufficient documentation

## 2024-01-06 DIAGNOSIS — I890 Lymphedema, not elsewhere classified: Secondary | ICD-10-CM | POA: Insufficient documentation

## 2024-01-09 ENCOUNTER — Other Ambulatory Visit: Payer: Self-pay | Admitting: Family Medicine

## 2024-01-09 ENCOUNTER — Other Ambulatory Visit: Payer: Self-pay | Admitting: Nurse Practitioner

## 2024-01-09 DIAGNOSIS — E1159 Type 2 diabetes mellitus with other circulatory complications: Secondary | ICD-10-CM

## 2024-01-10 NOTE — Telephone Encounter (Signed)
 Is this okay to refill?

## 2024-01-14 DIAGNOSIS — Z20828 Contact with and (suspected) exposure to other viral communicable diseases: Secondary | ICD-10-CM | POA: Diagnosis not present

## 2024-01-20 ENCOUNTER — Encounter: Payer: Self-pay | Admitting: Family Medicine

## 2024-01-20 ENCOUNTER — Ambulatory Visit: Admitting: Family Medicine

## 2024-01-20 VITALS — BP 130/82 | HR 90

## 2024-01-20 DIAGNOSIS — R54 Age-related physical debility: Secondary | ICD-10-CM

## 2024-01-20 DIAGNOSIS — M2550 Pain in unspecified joint: Secondary | ICD-10-CM

## 2024-01-20 DIAGNOSIS — E785 Hyperlipidemia, unspecified: Secondary | ICD-10-CM | POA: Diagnosis not present

## 2024-01-20 DIAGNOSIS — E118 Type 2 diabetes mellitus with unspecified complications: Secondary | ICD-10-CM

## 2024-01-20 DIAGNOSIS — E1169 Type 2 diabetes mellitus with other specified complication: Secondary | ICD-10-CM

## 2024-01-20 DIAGNOSIS — I152 Hypertension secondary to endocrine disorders: Secondary | ICD-10-CM | POA: Diagnosis not present

## 2024-01-20 DIAGNOSIS — E1159 Type 2 diabetes mellitus with other circulatory complications: Secondary | ICD-10-CM | POA: Diagnosis not present

## 2024-01-20 DIAGNOSIS — E669 Obesity, unspecified: Secondary | ICD-10-CM | POA: Diagnosis not present

## 2024-01-20 DIAGNOSIS — M545 Low back pain, unspecified: Secondary | ICD-10-CM | POA: Diagnosis not present

## 2024-01-20 DIAGNOSIS — I890 Lymphedema, not elsewhere classified: Secondary | ICD-10-CM

## 2024-01-20 DIAGNOSIS — Z23 Encounter for immunization: Secondary | ICD-10-CM | POA: Diagnosis not present

## 2024-01-20 DIAGNOSIS — E11618 Type 2 diabetes mellitus with other diabetic arthropathy: Secondary | ICD-10-CM

## 2024-01-20 DIAGNOSIS — B351 Tinea unguium: Secondary | ICD-10-CM

## 2024-01-20 DIAGNOSIS — I739 Peripheral vascular disease, unspecified: Secondary | ICD-10-CM | POA: Diagnosis not present

## 2024-01-20 DIAGNOSIS — R531 Weakness: Secondary | ICD-10-CM

## 2024-01-20 LAB — COMPREHENSIVE METABOLIC PANEL WITH GFR
ALT: 8 IU/L (ref 0–44)
AST: 10 IU/L (ref 0–40)
Albumin: 3.8 g/dL (ref 3.7–4.7)
Alkaline Phosphatase: 121 IU/L (ref 44–121)
BUN/Creatinine Ratio: 28 — ABNORMAL HIGH (ref 10–24)
BUN: 21 mg/dL (ref 8–27)
Bilirubin Total: <0.2 mg/dL (ref 0.0–1.2)
CO2: 21 mmol/L (ref 20–29)
Calcium: 9.5 mg/dL (ref 8.6–10.2)
Chloride: 103 mmol/L (ref 96–106)
Creatinine, Ser: 0.76 mg/dL (ref 0.76–1.27)
Globulin, Total: 3.5 g/dL (ref 1.5–4.5)
Glucose: 155 mg/dL — ABNORMAL HIGH (ref 70–99)
Potassium: 4.3 mmol/L (ref 3.5–5.2)
Sodium: 141 mmol/L (ref 134–144)
Total Protein: 7.3 g/dL (ref 6.0–8.5)
eGFR: 86 mL/min/{1.73_m2} (ref >59)

## 2024-01-20 LAB — CBC WITH DIFFERENTIAL/PLATELET
Basophils Absolute: 0 10*3/uL (ref 0.0–0.2)
Basos: 0 %
EOS (ABSOLUTE): 0.1 10*3/uL (ref 0.0–0.4)
Eos: 3 %
Hematocrit: 36.7 % — ABNORMAL LOW (ref 37.5–51.0)
Hemoglobin: 11.4 g/dL — ABNORMAL LOW (ref 13.0–17.7)
Immature Grans (Abs): 0 10*3/uL (ref 0.0–0.1)
Immature Granulocytes: 0 %
Lymphocytes Absolute: 1.4 10*3/uL (ref 0.7–3.1)
Lymphs: 31 %
MCH: 27.5 pg (ref 26.6–33.0)
MCHC: 31.1 g/dL — ABNORMAL LOW (ref 31.5–35.7)
MCV: 89 fL (ref 79–97)
Monocytes Absolute: 0.4 10*3/uL (ref 0.1–0.9)
Monocytes: 8 %
Neutrophils Absolute: 2.7 10*3/uL (ref 1.4–7.0)
Neutrophils: 58 %
Platelets: 442 10*3/uL (ref 150–450)
RBC: 4.14 x10E6/uL (ref 4.14–5.80)
RDW: 15.4 % (ref 11.6–15.4)
WBC: 4.7 10*3/uL (ref 3.4–10.8)

## 2024-01-20 LAB — LIPID PANEL
Chol/HDL Ratio: 2.2 ratio (ref 0.0–5.0)
Cholesterol, Total: 122 mg/dL (ref 100–199)
HDL: 56 mg/dL (ref >39)
LDL Chol Calc (NIH): 48 mg/dL (ref 0–99)
Triglycerides: 96 mg/dL (ref 0–149)
VLDL Cholesterol Cal: 18 mg/dL (ref 5–40)

## 2024-01-20 LAB — POCT GLYCOSYLATED HEMOGLOBIN (HGB A1C): Hemoglobin A1C: 7.2 % — AB (ref 4.0–5.6)

## 2024-01-20 MED ORDER — HYDROCHLOROTHIAZIDE 12.5 MG PO CAPS: 12.5000 mg | ORAL_CAPSULE | Freq: Every day | ORAL | 3 refills | Status: AC

## 2024-01-20 MED ORDER — GABAPENTIN 100 MG PO CAPS: 100.0000 mg | ORAL_CAPSULE | Freq: Two times a day (BID) | ORAL | 0 refills | Status: DC

## 2024-01-20 MED ORDER — LOVASTATIN 40 MG PO TABS: 40.0000 mg | ORAL_TABLET | Freq: Every day | ORAL | 3 refills | Status: DC

## 2024-01-20 MED ORDER — AMLODIPINE BESYLATE 10 MG PO TABS: 10.0000 mg | ORAL_TABLET | Freq: Every day | ORAL | 3 refills | Status: DC

## 2024-01-20 MED ORDER — SITAGLIPTIN PHOSPHATE 50 MG PO TABS: 50.0000 mg | ORAL_TABLET | Freq: Every day | ORAL | 1 refills | Status: DC

## 2024-01-20 MED ORDER — PIOGLITAZONE HCL-METFORMIN HCL 15-850 MG PO TABS: 1.0000 | ORAL_TABLET | Freq: Two times a day (BID) | ORAL | 0 refills | Status: DC

## 2024-01-20 NOTE — Progress Notes (Signed)
   Subjective:    Patient ID: Joe Barry, male    DOB: June 30, 1934, 89 y.o.   MRN: [id number]  HPI He is here for an interval evaluation.  [facility] and OT but apparently follow-up has been questionable.  There is also been some communication issues with that.  He is also followed by wound care and wounds are slowly healing.  He still has difficulty with arthritis and has had 1 replacement.  An attempt was made in the past to get with orthopedics but it fell through.  He continues on Neurontin , HCTZ, amlodipine .  Continues on Mevacor .  He is also taking Januvia  as well as Actos  plus.   Review of Systems     Objective:    Physical Exam Alert and in no distress.  Exam of extremities does show dependent edema with lymphedema.  Pulses will difficult to feel however sensation was normal. Hemoglobin A1c is 7.2      Assessment & Plan:  Type 2 diabetes mellitus with complications (HCC) - Plan: CBC with Differential/Platelet, Comprehensive metabolic panel with GFR, Lipid panel, POCT glycosylated hemoglobin (Hb A1C), POCT UA - Microalbumin, pioglitazone -metformin  (ACTOPLUS MET ) 15-850 MG tablet, sitaGLIPtin  (JANUVIA ) 50 MG tablet  PVD (peripheral vascular disease) (HCC)  Weakness  Polyarthralgia  Obesity (BMI 30-39.9)  Lymphedema  Hypertension associated with diabetes (HCC) - Plan: CBC with Differential/Platelet, Comprehensive metabolic panel with GFR, hydrochlorothiazide  (MICROZIDE ) 12.5 MG capsule, amLODipine  (NORVASC ) 10 MG tablet  Hyperlipidemia associated with type 2 diabetes mellitus (HCC) - Plan: Lipid panel, lovastatin  (MEVACOR ) 40 MG tablet  Chronic bilateral low back pain, unspecified whether sciatica present  Arthritis associated with diabetes (HCC) - Plan: Ambulatory referral to Orthopedic Surgery  Advanced age  Need for vaccination against Streptococcus pneumoniae - Plan: Pneumococcal conjugate vaccine 20-valent (Prevnar 20) I  will refer to orthopedics to see if they have any thoughts to help with his knee pain.  We will touch bases again with [facility] to find out exactly the status of his PT and OT and there suggestions concerning continuing care.  You are also discussed this with his daughter who is his main primary caregiver

## 2024-01-21 ENCOUNTER — Encounter: Payer: Self-pay | Admitting: Family Medicine

## 2024-01-22 DIAGNOSIS — E1122 Type 2 diabetes mellitus with diabetic chronic kidney disease: Secondary | ICD-10-CM | POA: Diagnosis not present

## 2024-01-26 ENCOUNTER — Encounter: Attending: Physician Assistant | Admitting: Physician Assistant

## 2024-01-26 DIAGNOSIS — Z7984 Long term (current) use of oral hypoglycemic drugs: Secondary | ICD-10-CM | POA: Insufficient documentation

## 2024-01-26 DIAGNOSIS — I7389 Other specified peripheral vascular diseases: Secondary | ICD-10-CM | POA: Diagnosis not present

## 2024-01-26 DIAGNOSIS — I10 Essential (primary) hypertension: Secondary | ICD-10-CM | POA: Diagnosis not present

## 2024-01-26 DIAGNOSIS — B351 Tinea unguium: Secondary | ICD-10-CM | POA: Diagnosis not present

## 2024-01-26 DIAGNOSIS — E11622 Type 2 diabetes mellitus with other skin ulcer: Secondary | ICD-10-CM | POA: Insufficient documentation

## 2024-01-26 DIAGNOSIS — L89893 Pressure ulcer of other site, stage 3: Secondary | ICD-10-CM | POA: Insufficient documentation

## 2024-01-26 DIAGNOSIS — I890 Lymphedema, not elsewhere classified: Secondary | ICD-10-CM | POA: Diagnosis not present

## 2024-01-27 ENCOUNTER — Ambulatory Visit: Admitting: Physician Assistant

## 2024-01-31 ENCOUNTER — Other Ambulatory Visit: Payer: Self-pay

## 2024-01-31 DIAGNOSIS — G8929 Other chronic pain: Secondary | ICD-10-CM

## 2024-01-31 DIAGNOSIS — Z7409 Other reduced mobility: Secondary | ICD-10-CM

## 2024-01-31 DIAGNOSIS — R531 Weakness: Secondary | ICD-10-CM

## 2024-01-31 DIAGNOSIS — M79671 Pain in right foot: Secondary | ICD-10-CM

## 2024-02-02 DIAGNOSIS — I152 Hypertension secondary to endocrine disorders: Secondary | ICD-10-CM | POA: Diagnosis not present

## 2024-02-02 DIAGNOSIS — I890 Lymphedema, not elsewhere classified: Secondary | ICD-10-CM | POA: Diagnosis not present

## 2024-02-02 DIAGNOSIS — E11618 Type 2 diabetes mellitus with other diabetic arthropathy: Secondary | ICD-10-CM | POA: Diagnosis not present

## 2024-02-02 DIAGNOSIS — E7849 Other hyperlipidemia: Secondary | ICD-10-CM | POA: Diagnosis not present

## 2024-02-02 DIAGNOSIS — E1159 Type 2 diabetes mellitus with other circulatory complications: Secondary | ICD-10-CM | POA: Diagnosis not present

## 2024-02-02 DIAGNOSIS — G5601 Carpal tunnel syndrome, right upper limb: Secondary | ICD-10-CM | POA: Diagnosis not present

## 2024-02-02 DIAGNOSIS — E1169 Type 2 diabetes mellitus with other specified complication: Secondary | ICD-10-CM | POA: Diagnosis not present

## 2024-02-02 DIAGNOSIS — E1136 Type 2 diabetes mellitus with diabetic cataract: Secondary | ICD-10-CM | POA: Diagnosis not present

## 2024-02-02 DIAGNOSIS — E1151 Type 2 diabetes mellitus with diabetic peripheral angiopathy without gangrene: Secondary | ICD-10-CM | POA: Diagnosis not present

## 2024-02-10 DIAGNOSIS — G5601 Carpal tunnel syndrome, right upper limb: Secondary | ICD-10-CM | POA: Diagnosis not present

## 2024-02-10 DIAGNOSIS — I152 Hypertension secondary to endocrine disorders: Secondary | ICD-10-CM | POA: Diagnosis not present

## 2024-02-10 DIAGNOSIS — E1159 Type 2 diabetes mellitus with other circulatory complications: Secondary | ICD-10-CM | POA: Diagnosis not present

## 2024-02-10 DIAGNOSIS — E7849 Other hyperlipidemia: Secondary | ICD-10-CM | POA: Diagnosis not present

## 2024-02-10 DIAGNOSIS — E1136 Type 2 diabetes mellitus with diabetic cataract: Secondary | ICD-10-CM | POA: Diagnosis not present

## 2024-02-10 DIAGNOSIS — I890 Lymphedema, not elsewhere classified: Secondary | ICD-10-CM | POA: Diagnosis not present

## 2024-02-10 DIAGNOSIS — E11618 Type 2 diabetes mellitus with other diabetic arthropathy: Secondary | ICD-10-CM | POA: Diagnosis not present

## 2024-02-10 DIAGNOSIS — E1151 Type 2 diabetes mellitus with diabetic peripheral angiopathy without gangrene: Secondary | ICD-10-CM | POA: Diagnosis not present

## 2024-02-10 DIAGNOSIS — E1169 Type 2 diabetes mellitus with other specified complication: Secondary | ICD-10-CM | POA: Diagnosis not present

## 2024-02-11 ENCOUNTER — Telehealth: Admitting: Family Medicine

## 2024-02-11 DIAGNOSIS — R197 Diarrhea, unspecified: Secondary | ICD-10-CM | POA: Diagnosis not present

## 2024-02-11 MED ORDER — FREESTYLE LIBRE 3 READER DEVI: 0 refills | Status: AC

## 2024-02-11 NOTE — Progress Notes (Signed)
   Subjective:    Patient ID: Joe Barry, male    DOB: 1934-04-22, 89 y.o.   MRN: [id number]  HPI Documentation for virtual audio and video telecommunications through Caregility encounter:  The patient was located at home. 2 patient identifiers used.  The provider was located in the office. The patient did consent to this visit and is aware of possible charges through their insurance for this visit.  The other persons participating in this telemedicine service was his daughter Time spent on call was 5 minutes and in review of previous records >20 minutes total for counseling and coordination of care.  This virtual service is not related to other E/M service within previous 7 days.  Apparently he has had difficulty with diarrhea since May 23.  They have been using Pepto-Bismol and his stool did turn black.  He does complain of some slight abdominal pain.  His daughter who is a nurse has checked his temperature and his blood sugars, both of which are okay.  Blood pressure lying and standing was reasonable with no indication for postural signs.  He apparently is not having any fever, chills.  Review of Systems     Objective:    Physical Exam Alert and in no distress otherwise not examined since this was a virtual       Assessment & Plan:  Diarrhea, unspecified type Recommend switching to Imodium up to 8 pills/day and eat what ever he wants.  If the diarrhea continues did recommend that he go to the emergency room probably [facility].  With continued diarrhea, electrolyte changes can certainly occur.

## 2024-02-16 DIAGNOSIS — E1151 Type 2 diabetes mellitus with diabetic peripheral angiopathy without gangrene: Secondary | ICD-10-CM | POA: Diagnosis not present

## 2024-02-16 DIAGNOSIS — E7849 Other hyperlipidemia: Secondary | ICD-10-CM | POA: Diagnosis not present

## 2024-02-16 DIAGNOSIS — E11618 Type 2 diabetes mellitus with other diabetic arthropathy: Secondary | ICD-10-CM | POA: Diagnosis not present

## 2024-02-16 DIAGNOSIS — G5601 Carpal tunnel syndrome, right upper limb: Secondary | ICD-10-CM | POA: Diagnosis not present

## 2024-02-16 DIAGNOSIS — E1159 Type 2 diabetes mellitus with other circulatory complications: Secondary | ICD-10-CM | POA: Diagnosis not present

## 2024-02-16 DIAGNOSIS — I890 Lymphedema, not elsewhere classified: Secondary | ICD-10-CM | POA: Diagnosis not present

## 2024-02-16 DIAGNOSIS — E1136 Type 2 diabetes mellitus with diabetic cataract: Secondary | ICD-10-CM | POA: Diagnosis not present

## 2024-02-16 DIAGNOSIS — I152 Hypertension secondary to endocrine disorders: Secondary | ICD-10-CM | POA: Diagnosis not present

## 2024-02-16 DIAGNOSIS — E1169 Type 2 diabetes mellitus with other specified complication: Secondary | ICD-10-CM | POA: Diagnosis not present

## 2024-02-17 ENCOUNTER — Ambulatory Visit: Admitting: Physician Assistant

## 2024-02-21 DIAGNOSIS — I152 Hypertension secondary to endocrine disorders: Secondary | ICD-10-CM | POA: Diagnosis not present

## 2024-02-21 DIAGNOSIS — I890 Lymphedema, not elsewhere classified: Secondary | ICD-10-CM | POA: Diagnosis not present

## 2024-02-21 DIAGNOSIS — E1159 Type 2 diabetes mellitus with other circulatory complications: Secondary | ICD-10-CM | POA: Diagnosis not present

## 2024-02-21 DIAGNOSIS — E1169 Type 2 diabetes mellitus with other specified complication: Secondary | ICD-10-CM | POA: Diagnosis not present

## 2024-02-21 DIAGNOSIS — E7849 Other hyperlipidemia: Secondary | ICD-10-CM | POA: Diagnosis not present

## 2024-02-21 DIAGNOSIS — E1136 Type 2 diabetes mellitus with diabetic cataract: Secondary | ICD-10-CM | POA: Diagnosis not present

## 2024-02-21 DIAGNOSIS — E11618 Type 2 diabetes mellitus with other diabetic arthropathy: Secondary | ICD-10-CM | POA: Diagnosis not present

## 2024-02-21 DIAGNOSIS — E1151 Type 2 diabetes mellitus with diabetic peripheral angiopathy without gangrene: Secondary | ICD-10-CM | POA: Diagnosis not present

## 2024-02-21 DIAGNOSIS — G5601 Carpal tunnel syndrome, right upper limb: Secondary | ICD-10-CM | POA: Diagnosis not present

## 2024-02-22 DIAGNOSIS — E1122 Type 2 diabetes mellitus with diabetic chronic kidney disease: Secondary | ICD-10-CM | POA: Diagnosis not present

## 2024-02-23 ENCOUNTER — Encounter: Payer: Self-pay | Admitting: Family Medicine

## 2024-02-23 ENCOUNTER — Ambulatory Visit: Admitting: Orthopaedic Surgery

## 2024-02-23 NOTE — Telephone Encounter (Signed)
 Faxed over

## 2024-02-24 ENCOUNTER — Encounter: Attending: Physician Assistant | Admitting: Physician Assistant

## 2024-02-24 DIAGNOSIS — I890 Lymphedema, not elsewhere classified: Secondary | ICD-10-CM | POA: Insufficient documentation

## 2024-02-24 DIAGNOSIS — L89893 Pressure ulcer of other site, stage 3: Secondary | ICD-10-CM | POA: Insufficient documentation

## 2024-02-24 DIAGNOSIS — I7389 Other specified peripheral vascular diseases: Secondary | ICD-10-CM | POA: Insufficient documentation

## 2024-02-24 DIAGNOSIS — E11622 Type 2 diabetes mellitus with other skin ulcer: Secondary | ICD-10-CM | POA: Diagnosis not present

## 2024-02-24 DIAGNOSIS — I10 Essential (primary) hypertension: Secondary | ICD-10-CM | POA: Diagnosis not present

## 2024-02-24 DIAGNOSIS — Z7984 Long term (current) use of oral hypoglycemic drugs: Secondary | ICD-10-CM | POA: Diagnosis not present

## 2024-02-28 DIAGNOSIS — I890 Lymphedema, not elsewhere classified: Secondary | ICD-10-CM | POA: Diagnosis not present

## 2024-02-28 DIAGNOSIS — E1159 Type 2 diabetes mellitus with other circulatory complications: Secondary | ICD-10-CM | POA: Diagnosis not present

## 2024-02-28 DIAGNOSIS — E1151 Type 2 diabetes mellitus with diabetic peripheral angiopathy without gangrene: Secondary | ICD-10-CM | POA: Diagnosis not present

## 2024-02-28 DIAGNOSIS — E7849 Other hyperlipidemia: Secondary | ICD-10-CM | POA: Diagnosis not present

## 2024-02-28 DIAGNOSIS — E11618 Type 2 diabetes mellitus with other diabetic arthropathy: Secondary | ICD-10-CM | POA: Diagnosis not present

## 2024-02-28 DIAGNOSIS — E1169 Type 2 diabetes mellitus with other specified complication: Secondary | ICD-10-CM | POA: Diagnosis not present

## 2024-02-28 DIAGNOSIS — I152 Hypertension secondary to endocrine disorders: Secondary | ICD-10-CM | POA: Diagnosis not present

## 2024-02-28 DIAGNOSIS — G5601 Carpal tunnel syndrome, right upper limb: Secondary | ICD-10-CM | POA: Diagnosis not present

## 2024-02-28 DIAGNOSIS — E1136 Type 2 diabetes mellitus with diabetic cataract: Secondary | ICD-10-CM | POA: Diagnosis not present

## 2024-03-03 DIAGNOSIS — G5601 Carpal tunnel syndrome, right upper limb: Secondary | ICD-10-CM | POA: Diagnosis not present

## 2024-03-03 DIAGNOSIS — I890 Lymphedema, not elsewhere classified: Secondary | ICD-10-CM | POA: Diagnosis not present

## 2024-03-03 DIAGNOSIS — E1136 Type 2 diabetes mellitus with diabetic cataract: Secondary | ICD-10-CM | POA: Diagnosis not present

## 2024-03-03 DIAGNOSIS — I152 Hypertension secondary to endocrine disorders: Secondary | ICD-10-CM | POA: Diagnosis not present

## 2024-03-03 DIAGNOSIS — E11618 Type 2 diabetes mellitus with other diabetic arthropathy: Secondary | ICD-10-CM | POA: Diagnosis not present

## 2024-03-03 DIAGNOSIS — E1159 Type 2 diabetes mellitus with other circulatory complications: Secondary | ICD-10-CM | POA: Diagnosis not present

## 2024-03-03 DIAGNOSIS — E7849 Other hyperlipidemia: Secondary | ICD-10-CM | POA: Diagnosis not present

## 2024-03-03 DIAGNOSIS — E1169 Type 2 diabetes mellitus with other specified complication: Secondary | ICD-10-CM | POA: Diagnosis not present

## 2024-03-03 DIAGNOSIS — E1151 Type 2 diabetes mellitus with diabetic peripheral angiopathy without gangrene: Secondary | ICD-10-CM | POA: Diagnosis not present

## 2024-03-07 ENCOUNTER — Ambulatory Visit: Admitting: Orthopaedic Surgery

## 2024-03-08 DIAGNOSIS — E7849 Other hyperlipidemia: Secondary | ICD-10-CM | POA: Diagnosis not present

## 2024-03-08 DIAGNOSIS — I890 Lymphedema, not elsewhere classified: Secondary | ICD-10-CM | POA: Diagnosis not present

## 2024-03-08 DIAGNOSIS — I152 Hypertension secondary to endocrine disorders: Secondary | ICD-10-CM | POA: Diagnosis not present

## 2024-03-08 DIAGNOSIS — E1136 Type 2 diabetes mellitus with diabetic cataract: Secondary | ICD-10-CM | POA: Diagnosis not present

## 2024-03-08 DIAGNOSIS — E1151 Type 2 diabetes mellitus with diabetic peripheral angiopathy without gangrene: Secondary | ICD-10-CM | POA: Diagnosis not present

## 2024-03-08 DIAGNOSIS — E1169 Type 2 diabetes mellitus with other specified complication: Secondary | ICD-10-CM | POA: Diagnosis not present

## 2024-03-08 DIAGNOSIS — E1159 Type 2 diabetes mellitus with other circulatory complications: Secondary | ICD-10-CM | POA: Diagnosis not present

## 2024-03-08 DIAGNOSIS — G5601 Carpal tunnel syndrome, right upper limb: Secondary | ICD-10-CM | POA: Diagnosis not present

## 2024-03-08 DIAGNOSIS — E11618 Type 2 diabetes mellitus with other diabetic arthropathy: Secondary | ICD-10-CM | POA: Diagnosis not present

## 2024-03-14 ENCOUNTER — Other Ambulatory Visit: Payer: Self-pay | Admitting: Family Medicine

## 2024-03-14 DIAGNOSIS — E118 Type 2 diabetes mellitus with unspecified complications: Secondary | ICD-10-CM

## 2024-03-15 ENCOUNTER — Ambulatory Visit: Admitting: Orthopaedic Surgery

## 2024-03-15 DIAGNOSIS — E1151 Type 2 diabetes mellitus with diabetic peripheral angiopathy without gangrene: Secondary | ICD-10-CM | POA: Diagnosis not present

## 2024-03-15 DIAGNOSIS — I152 Hypertension secondary to endocrine disorders: Secondary | ICD-10-CM | POA: Diagnosis not present

## 2024-03-15 DIAGNOSIS — M1711 Unilateral primary osteoarthritis, right knee: Secondary | ICD-10-CM | POA: Insufficient documentation

## 2024-03-15 DIAGNOSIS — E1159 Type 2 diabetes mellitus with other circulatory complications: Secondary | ICD-10-CM | POA: Diagnosis not present

## 2024-03-15 DIAGNOSIS — E11618 Type 2 diabetes mellitus with other diabetic arthropathy: Secondary | ICD-10-CM | POA: Diagnosis not present

## 2024-03-15 DIAGNOSIS — I890 Lymphedema, not elsewhere classified: Secondary | ICD-10-CM | POA: Diagnosis not present

## 2024-03-15 DIAGNOSIS — E7849 Other hyperlipidemia: Secondary | ICD-10-CM | POA: Diagnosis not present

## 2024-03-15 DIAGNOSIS — E1169 Type 2 diabetes mellitus with other specified complication: Secondary | ICD-10-CM | POA: Diagnosis not present

## 2024-03-15 DIAGNOSIS — E1136 Type 2 diabetes mellitus with diabetic cataract: Secondary | ICD-10-CM | POA: Diagnosis not present

## 2024-03-15 DIAGNOSIS — G5601 Carpal tunnel syndrome, right upper limb: Secondary | ICD-10-CM | POA: Diagnosis not present

## 2024-03-15 MED ORDER — LIDOCAINE HCL 1 % IJ SOLN
2.0000 mL | INTRAMUSCULAR | Status: AC | PRN
  Administered 2024-03-15: 2 mL

## 2024-03-15 MED ORDER — METHYLPREDNISOLONE ACETATE 40 MG/ML IJ SUSP
40.0000 mg | INTRAMUSCULAR | Status: AC | PRN
  Administered 2024-03-15: 40 mg via INTRA_ARTICULAR

## 2024-03-15 MED ORDER — BUPIVACAINE HCL 0.5 % IJ SOLN
2.0000 mL | INTRAMUSCULAR | Status: AC | PRN
  Administered 2024-03-15: 2 mL via INTRA_ARTICULAR

## 2024-03-15 NOTE — Progress Notes (Signed)
 Office Visit Note   Patient: Joe Barry           Date of Birth: Feb 07, 1934           MRN: [id number] Visit Date: 03/15/2024              Requested by: Joyce Norleen BROCKS, MD [address] [place],  [place] [address] PCP: Joyce Norleen BROCKS, MD   Assessment & Plan: Visit Diagnoses:  1. Primary osteoarthritis of right knee     Plan: History of Present Illness Joe Barry is an 89 year old male who presents with bilateral leg weakness and difficulty walking.  He has experienced bilateral leg weakness and difficulty walking since September after two consecutive falls. He is now wheelchair-bound, having previously used a walker and been able to get out of bed independently.  He has undergone two rounds of physical therapy but continues to have significant weakness, particularly in the right leg. The right knee is painful and has limited range of motion, hindering physical therapy participation.  His left knee was replaced in 2009, and the right knee has not been replaced. He has not received a recent cortisone injection.  Vascular evaluation shows adequate circulation but presence of lymphedema. He has diabetes, which is well-controlled. He resides at home.  Physical Exam EXTREMITIES: Generalized swelling and lymphedema in legs. MUSCULOSKELETAL: Right knee arthritic changes. Left ankle arthritic changes.  Assessment and Plan Osteoarthritis of right knee Severe osteoarthritis causing significant pain and limiting therapy. Knee replacement not feasible due to age and deconditioning. - Administer cortisone injection to right knee. Discussed benefits of pain relief and improved mobility, risk of increased blood sugar.  Arthritis of left ankle Contributes to lower extremity discomfort.  Muscle weakness Weakness and inability to walk exacerbated by fear of falling and prolonged wheelchair use. - Continue physical therapy to improve strength and confidence. Discussed impact of  immobility on muscle mass loss and importance of therapy.  Lymphedema Generalized leg swelling with adequate circulation but persistent lymphedema.  Diabetes mellitus Well-controlled. Cortisone injection may temporarily increase blood sugar.  Follow-Up Instructions: No follow-ups on file.   Orders:  No orders of the defined types were placed in this encounter.  No orders of the defined types were placed in this encounter.     Procedures: Large Joint Inj: R knee on 03/15/2024 10:55 AM Indications: pain Details: 22 G needle  Arthrogram: No  Medications: 40 mg methylPREDNISolone acetate 40 MG/ML; 2 mL lidocaine  1 %; 2 mL bupivacaine  0.5 % Consent was given by the patient. Patient was prepped and draped in the usual sterile fashion.       Clinical Data: No additional findings.   Subjective: Chief Complaint  Patient presents with   Right Leg - Pain   Left Leg - Pain    HPI  Review of Systems  Constitutional: Negative.   HENT: Negative.    Eyes: Negative.   Respiratory: Negative.    Cardiovascular: Negative.   Gastrointestinal: Negative.   Endocrine: Negative.   Genitourinary: Negative.   Skin: Negative.   Allergic/Immunologic: Negative.   Neurological: Negative.   Hematological: Negative.   Psychiatric/Behavioral: Negative.    All other systems reviewed and are negative.    Objective: Vital Signs: There were no vitals taken for this visit.  Physical Exam Vitals and nursing note reviewed.  Constitutional:      Appearance: He is well-developed.  HENT:     Head: Normocephalic and atraumatic.  Eyes:  Pupils: Pupils are equal, round, and reactive to light.  Pulmonary:     Effort: Pulmonary effort is normal.  Abdominal:     Palpations: Abdomen is soft.  Musculoskeletal:        General: Normal range of motion.     Cervical back: Neck supple.  Skin:    General: Skin is warm.  Neurological:     Mental Status: He is alert and oriented to person,  place, and time.  Psychiatric:        Behavior: Behavior normal.        Thought Content: Thought content normal.        Judgment: Judgment normal.     Ortho Exam  Specialty Comments:  No specialty comments available.  Imaging: No results found.   PMFS History: Patient Active Problem List   Diagnosis Date Noted   Primary osteoarthritis of right knee 03/15/2024   Weakness 10/07/2023   Polyarthralgia 10/07/2023   Chronic back pain 10/07/2023   Chronic pain of both knees 10/07/2023   Advanced age 01/05/2024   Nonhealing wound of heel 07/05/2023   Foot pain, bilateral 07/05/2023   Impaired mobility 07/05/2023   Prostatic hyperplasia 03/11/2022   Lymphedema 04/18/2018   Obesity (BMI 30-39.9) 04/18/2018   Onychomycosis 04/03/2016   Arthritis associated with diabetes (HCC) 05/09/2014   PVD (peripheral vascular disease) (HCC) 10/18/2012   False aneurysm (HCC) 07/26/2012   Hypertension associated with diabetes (HCC) 01/21/2012   Type 2 diabetes mellitus with complications (HCC) 01/21/2012   Hyperlipidemia associated with type 2 diabetes mellitus (HCC) 01/21/2012   Macular hole of right eye 12/30/2011   Past Medical History:  Diagnosis Date   AAA (abdominal aortic aneurysm) (HCC)    Arthritis    BPH (benign prostatic hypertrophy)    Cataract    Diabetes mellitus    Dyslipidemia    Hypertension    Dr. Baldwin,    Neuromuscular disorder (HCC)    carpal tunnel in right wrist   No blood products    Refusal of blood transfusions as patient is Jehovah's Witness     Family History  Problem Relation Age of Onset   Cancer Mother        ovarian   Diabetes Father     Past Surgical History:  Procedure Laterality Date   CARPAL TUNNEL RELEASE     CHOLECYSTECTOMY     COLONOSCOPY  2008   EYE SURGERY     bilat cataract    FALSE ANEURYSM REPAIR  07/16/2012   Procedure: REPAIR FALSE ANEURYSM;  Surgeon: Krystal JULIANNA Doing, MD;  Location: [facility] OR;  Service: Vascular;  Laterality:  Bilateral;  false femoral aneurysm repair with femoral artery repair   GAS/FLUID EXCHANGE  01/28/2012   Procedure: GAS/FLUID EXCHANGE;  Surgeon: Norleen JONETTA Ku, MD;  Location: [facility] OR;  Service: Ophthalmology;  Laterality: Right;   I & D EXTREMITY  09/23/2012   Procedure: IRRIGATION AND DEBRIDEMENT EXTREMITY;  Surgeon: Krystal JULIANNA Doing, MD;  Location: [facility] OR;  Service: Vascular;  Laterality: Right;  Irrigation and Debridement prepubic and right groin wound   PARS PLANA VITRECTOMY W/ REPAIR OF MACULAR HOLE  01/2012   PARS PLANA VITRECTOMY W/ SCLERAL BUCKLE  01/28/2012   Procedure: PARS PLANA VITRECTOMY WITH LASER FOR MACULAR HOLE;  Surgeon: Norleen JONETTA Ku, MD;  Location: [facility] OR;  Service: Ophthalmology;  Laterality: Right;  25 gauge ppv right eye    SIGMOIDOSCOPY     SKIN BIOPSY Left 10/21/2020   pyogenic granuloma  ulcerated    TOTAL KNEE ARTHROPLASTY  2009   Left   TRANSURETHRAL RESECTION OF PROSTATE N/A 03/11/2022   Procedure: TRANSURETHRAL RESECTION OF THE PROSTATE (TURP);  Surgeon: Alvaro Hummer, MD;  Location: [facility] ORS;  Service: Urology;  Laterality: N/A;  29 MINS   Social History   Occupational History   Not on file  Tobacco Use   Smoking status: Former    Current packs/day: 0.00    Types: Cigarettes    Quit date: 07/26/1972    Years since quitting: 51.6   Smokeless tobacco: Never  Vaping Use   Vaping status: Never Used  Substance and Sexual Activity   Alcohol use: Yes    Alcohol/week: 3.0 standard drinks of alcohol    Types: 3 Shots of liquor per week    Comment: every day or so   Drug use: No   Sexual activity: Not Currently

## 2024-03-16 ENCOUNTER — Ambulatory Visit: Admitting: Physician Assistant

## 2024-03-22 ENCOUNTER — Encounter: Attending: Physician Assistant | Admitting: Physician Assistant

## 2024-03-22 DIAGNOSIS — E1151 Type 2 diabetes mellitus with diabetic peripheral angiopathy without gangrene: Secondary | ICD-10-CM | POA: Insufficient documentation

## 2024-03-22 DIAGNOSIS — L89893 Pressure ulcer of other site, stage 3: Secondary | ICD-10-CM | POA: Diagnosis not present

## 2024-03-22 DIAGNOSIS — I10 Essential (primary) hypertension: Secondary | ICD-10-CM | POA: Insufficient documentation

## 2024-03-22 DIAGNOSIS — Z7984 Long term (current) use of oral hypoglycemic drugs: Secondary | ICD-10-CM | POA: Insufficient documentation

## 2024-03-22 DIAGNOSIS — I890 Lymphedema, not elsewhere classified: Secondary | ICD-10-CM | POA: Insufficient documentation

## 2024-03-22 DIAGNOSIS — E11622 Type 2 diabetes mellitus with other skin ulcer: Secondary | ICD-10-CM | POA: Diagnosis not present

## 2024-03-23 DIAGNOSIS — E1122 Type 2 diabetes mellitus with diabetic chronic kidney disease: Secondary | ICD-10-CM | POA: Diagnosis not present

## 2024-03-27 DIAGNOSIS — E11618 Type 2 diabetes mellitus with other diabetic arthropathy: Secondary | ICD-10-CM | POA: Diagnosis not present

## 2024-03-27 DIAGNOSIS — G5601 Carpal tunnel syndrome, right upper limb: Secondary | ICD-10-CM | POA: Diagnosis not present

## 2024-03-27 DIAGNOSIS — E1151 Type 2 diabetes mellitus with diabetic peripheral angiopathy without gangrene: Secondary | ICD-10-CM | POA: Diagnosis not present

## 2024-03-27 DIAGNOSIS — E1136 Type 2 diabetes mellitus with diabetic cataract: Secondary | ICD-10-CM | POA: Diagnosis not present

## 2024-03-27 DIAGNOSIS — E1159 Type 2 diabetes mellitus with other circulatory complications: Secondary | ICD-10-CM | POA: Diagnosis not present

## 2024-03-27 DIAGNOSIS — E7849 Other hyperlipidemia: Secondary | ICD-10-CM | POA: Diagnosis not present

## 2024-03-27 DIAGNOSIS — E1169 Type 2 diabetes mellitus with other specified complication: Secondary | ICD-10-CM | POA: Diagnosis not present

## 2024-03-27 DIAGNOSIS — I152 Hypertension secondary to endocrine disorders: Secondary | ICD-10-CM | POA: Diagnosis not present

## 2024-03-27 DIAGNOSIS — I890 Lymphedema, not elsewhere classified: Secondary | ICD-10-CM | POA: Diagnosis not present

## 2024-04-13 ENCOUNTER — Other Ambulatory Visit: Payer: Self-pay | Admitting: Family Medicine

## 2024-04-23 DIAGNOSIS — E1122 Type 2 diabetes mellitus with diabetic chronic kidney disease: Secondary | ICD-10-CM | POA: Diagnosis not present

## 2024-05-15 ENCOUNTER — Other Ambulatory Visit: Payer: Self-pay | Admitting: Family Medicine

## 2024-05-19 ENCOUNTER — Other Ambulatory Visit: Payer: Self-pay | Admitting: Family Medicine

## 2024-05-19 DIAGNOSIS — E1169 Type 2 diabetes mellitus with other specified complication: Secondary | ICD-10-CM

## 2024-06-14 ENCOUNTER — Other Ambulatory Visit: Payer: Self-pay | Admitting: Family Medicine

## 2024-06-23 DIAGNOSIS — E1122 Type 2 diabetes mellitus with diabetic chronic kidney disease: Secondary | ICD-10-CM | POA: Diagnosis not present

## 2024-06-27 ENCOUNTER — Ambulatory Visit: Admitting: Family Medicine

## 2024-06-28 ENCOUNTER — Telehealth: Payer: Self-pay

## 2024-06-28 ENCOUNTER — Telehealth: Payer: Self-pay | Admitting: Family Medicine

## 2024-06-28 ENCOUNTER — Ambulatory Visit: Admitting: Family Medicine

## 2024-06-28 VITALS — BP 118/50 | HR 74

## 2024-06-28 DIAGNOSIS — N400 Enlarged prostate without lower urinary tract symptoms: Secondary | ICD-10-CM | POA: Diagnosis not present

## 2024-06-28 DIAGNOSIS — M791 Myalgia, unspecified site: Secondary | ICD-10-CM | POA: Diagnosis not present

## 2024-06-28 DIAGNOSIS — L299 Pruritus, unspecified: Secondary | ICD-10-CM | POA: Diagnosis not present

## 2024-06-28 DIAGNOSIS — Z23 Encounter for immunization: Secondary | ICD-10-CM

## 2024-06-28 DIAGNOSIS — R630 Anorexia: Secondary | ICD-10-CM | POA: Diagnosis not present

## 2024-06-28 MED ORDER — HYDROXYZINE HCL 10 MG PO TABS: 10.0000 mg | ORAL_TABLET | Freq: Three times a day (TID) | ORAL | 0 refills | Status: DC | PRN

## 2024-06-28 MED ORDER — FINASTERIDE 5 MG PO TABS: 5.0000 mg | ORAL_TABLET | Freq: Every day | ORAL | 3 refills | Status: AC

## 2024-06-28 NOTE — Telephone Encounter (Signed)
 Hospital bed mattress rx/order faxed to [facility] ( fax # [phone])

## 2024-06-28 NOTE — Progress Notes (Signed)
   Subjective:    Patient ID: Joe Barry, male    DOB: May 21, 1934, 89 y.o.   MRN: [id number]  Discussed the use of AI scribe software for clinical note transcription with the patient, who gave verbal consent to proceed.  History of Present Illness   Joe Barry is an 89 year old male who presents with generalized body pain and itching following a recent cold.  Approximately two weeks ago, he experienced symptoms consistent with a cold, including coughing and congestion, without fever. These symptoms persisted for about seven to ten days before resolving.  Following the resolution of his cold symptoms, he began experiencing generalized body pain, described as 'hurting all over' and 'aching' upon waking in the morning.  In addition to the body pain, he reports severe pruritus all over his body. He has tried oral Benadryl at bedtime, but its effectiveness is unclear.  He has a very poor appetite and has been eating minimally. His last recorded weight was over a year ago, as he has been unable to stand since he stopped walking last September.  He has not received a shingles vaccine due to logistical challenges. His current medications include Myrbetriq for overactive bladder, which is no longer on formulary, and finasteride , which has not been renewed due to lack of a urology visit.  No fever, sore throat, earache, or shortness of breath.           Review of Systems     Objective:    Physical Exam \\Alert  and in no distress. Tympanic membranes and canals are normal. Pharyngeal area is normal. Neck is supple without adenopathy or thyromegaly. Cardiac exam shows a regular sinus rhythm without murmurs or gallops. Lungs are clear to auscultation.            Assessment & Plan:  Assessment and Plan    Generalized pruritus Persistent itching with unclear response to oral Benadryl. - Administer hydroxyzine.  Generalized myalgias Whole body aching post-cold symptoms. Differential  includes age-related changes. - Order blood work including sed rate.  Anorexia Very poor appetite with no weight monitoring. - Evaluate blood work for underlying causes. Trying to get a true weight on him would be problematic due to him being in a wheelchair. Benign prostatic hyperplasia Myrbetriq discontinued due to formulary issues. Finasteriderenewed. - Do not renew Myrbetriq - Consider alternative medications post-urology consultation.

## 2024-06-28 NOTE — Telephone Encounter (Signed)
 Done

## 2024-06-28 NOTE — Telephone Encounter (Signed)
 Patient requests Handicap Placard, Form is in your correspondence folder.

## 2024-06-29 ENCOUNTER — Ambulatory Visit: Payer: Self-pay | Admitting: Family Medicine

## 2024-06-29 LAB — CBC WITH DIFFERENTIAL/PLATELET
Basophils Absolute: 0 x10E3/uL (ref 0.0–0.2)
Basos: 0 %
EOS (ABSOLUTE): 0.1 x10E3/uL (ref 0.0–0.4)
Eos: 3 %
Hematocrit: 34.4 % — ABNORMAL LOW (ref 37.5–51.0)
Hemoglobin: 11 g/dL — ABNORMAL LOW (ref 13.0–17.7)
Immature Grans (Abs): 0 x10E3/uL (ref 0.0–0.1)
Immature Granulocytes: 0 %
Lymphocytes Absolute: 1.4 x10E3/uL (ref 0.7–3.1)
Lymphs: 25 %
MCH: 28.7 pg (ref 26.6–33.0)
MCHC: 32 g/dL (ref 31.5–35.7)
MCV: 90 fL (ref 79–97)
Monocytes Absolute: 0.5 x10E3/uL (ref 0.1–0.9)
Monocytes: 9 %
Neutrophils Absolute: 3.5 x10E3/uL (ref 1.4–7.0)
Neutrophils: 63 %
Platelets: 438 x10E3/uL (ref 150–450)
RBC: 3.83 x10E6/uL — ABNORMAL LOW (ref 4.14–5.80)
RDW: 14.1 % (ref 11.6–15.4)
WBC: 5.6 x10E3/uL (ref 3.4–10.8)

## 2024-06-29 LAB — COMPREHENSIVE METABOLIC PANEL WITH GFR
ALT: 5 IU/L (ref 0–44)
AST: 9 IU/L (ref 0–40)
Albumin: 3.4 g/dL — ABNORMAL LOW (ref 3.7–4.7)
Alkaline Phosphatase: 113 IU/L (ref 48–129)
BUN/Creatinine Ratio: 38 — ABNORMAL HIGH (ref 10–24)
BUN: 25 mg/dL (ref 8–27)
Bilirubin Total: 0.2 mg/dL (ref 0.0–1.2)
CO2: 22 mmol/L (ref 20–29)
Calcium: 9.2 mg/dL (ref 8.6–10.2)
Chloride: 98 mmol/L (ref 96–106)
Creatinine, Ser: 0.66 mg/dL — ABNORMAL LOW (ref 0.76–1.27)
Globulin, Total: 3.5 g/dL (ref 1.5–4.5)
Glucose: 149 mg/dL — ABNORMAL HIGH (ref 70–99)
Potassium: 3.6 mmol/L (ref 3.5–5.2)
Sodium: 135 mmol/L (ref 134–144)
Total Protein: 6.9 g/dL (ref 6.0–8.5)
eGFR: 90 mL/min/1.73 (ref >59)

## 2024-06-29 LAB — SEDIMENTATION RATE: Sed Rate: 120 mm/h — AB (ref 0–30)

## 2024-06-30 DIAGNOSIS — I739 Peripheral vascular disease, unspecified: Secondary | ICD-10-CM | POA: Diagnosis not present

## 2024-07-17 ENCOUNTER — Other Ambulatory Visit: Payer: Self-pay | Admitting: Family Medicine

## 2024-07-17 ENCOUNTER — Encounter: Payer: Self-pay | Admitting: Radiology

## 2024-07-21 ENCOUNTER — Other Ambulatory Visit: Payer: Self-pay | Admitting: Family Medicine

## 2024-07-21 DIAGNOSIS — E118 Type 2 diabetes mellitus with unspecified complications: Secondary | ICD-10-CM

## 2024-07-24 DIAGNOSIS — E1122 Type 2 diabetes mellitus with diabetic chronic kidney disease: Secondary | ICD-10-CM | POA: Diagnosis not present

## 2024-08-09 ENCOUNTER — Other Ambulatory Visit: Payer: Self-pay | Admitting: Family Medicine

## 2024-08-09 DIAGNOSIS — L299 Pruritus, unspecified: Secondary | ICD-10-CM

## 2024-08-09 NOTE — Telephone Encounter (Signed)
 Last appt:01/20/24

## 2024-08-19 ENCOUNTER — Other Ambulatory Visit: Payer: Self-pay | Admitting: Family Medicine

## 2024-08-29 ENCOUNTER — Ambulatory Visit: Payer: Medicare PPO

## 2024-08-29 DIAGNOSIS — Z0000 Encounter for general adult medical examination without abnormal findings: Secondary | ICD-10-CM

## 2024-08-29 NOTE — Patient Instructions (Signed)
 Joe Barry,  Thank you for taking the time for your Medicare Wellness Visit. I appreciate your continued commitment to your health goals. Please review the care plan we discussed, and feel free to reach out if I can assist you further.  Please note that Annual Wellness Visits do not include a physical exam. Some assessments may be limited, especially if the visit was conducted virtually. If needed, we may recommend an in-person follow-up with your provider.  Ongoing Care Seeing your primary care provider every 3 to 6 months helps us  monitor your health and provide consistent, personalized care.   Referrals If a referral was made during today's visit and you haven't received any updates within two weeks, please contact the referred provider directly to check on the status.  Recommended Screenings:  Health Maintenance  Topic Date Due   Zoster (Shingles) Vaccine (1 of 2) Never done   Eye exam for diabetics  07/29/2016   DTaP/Tdap/Td vaccine (2 - Td or Tdap) 01/20/2022   Hemoglobin A1C  07/22/2024   Medicare Annual Wellness Visit  08/23/2024   COVID-19 Vaccine (6 - 2025-26 season) 12/27/2024   Complete foot exam   01/19/2025   Pneumococcal Vaccine for age over 41  Completed   Flu Shot  Completed   Meningitis B Vaccine  Aged Out       08/29/2024    9:38 AM  Advanced Directives  Does Patient Have a Medical Advance Directive? Yes  Type of Estate Agent of [place];Living will  Copy of Healthcare Power of Attorney in Chart? No - copy requested    Vision: Annual vision screenings are recommended for early detection of glaucoma, cataracts, and diabetic retinopathy. These exams can also reveal signs of chronic conditions such as diabetes and high blood pressure.  Dental: Annual dental screenings help detect early signs of oral cancer, gum disease, and other conditions linked to overall health, including heart disease and diabetes.  Please see the attached documents  for additional preventive care recommendations.

## 2024-08-29 NOTE — Progress Notes (Signed)
 Chief Complaint  Patient presents with   Medicare Wellness     Subjective:   Joe Barry is a 89 y.o. male who presents for a Medicare Annual Wellness Visit.  Visit info / Clinical Intake: Medicare Wellness Visit Type:: Subsequent Annual Wellness Visit Persons participating in visit and providing information:: patient & caregiver Medicare Wellness Visit Mode:: Video Since this visit was completed virtually, some vitals may be partially provided or unavailable. Missing vitals are due to the limitations of the virtual format.: Unable to obtain vitals - no equipment If Telephone or Video please confirm:: I connected with patient using audio/video enable telemedicine. I verified patient identity with two identifiers, discussed telehealth limitations, and patient agreed to proceed. Patient Location:: home Provider Location:: office Interpreter Needed?: No Pre-visit prep was completed: yes AWV questionnaire completed by patient prior to visit?: no Living arrangements:: with family/others Patient's Overall Health Status Rating: (!) fair Typical amount of pain: some Does pain affect daily life?: no Are you currently prescribed opioids?: no  Dietary Habits and Nutritional Risks How many meals a day?: 2 Eats fruit and vegetables daily?: yes Most meals are obtained by: having others provide food In the last 2 weeks, have you had any of the following?: none Diabetic:: no  Functional Status Activities of Daily Living (to include ambulation/medication): (!) Dependent Feeding: Independent Dressing/Grooming: Dependent Bathing: Dependent Toileting: Dependent Transfer: Dependent Ambulation: Dependent Medication Administration: Dependent Home Management (perform basic housework or laundry): Dependent Manage your own finances?: (!) no Primary transportation is: family / friends Concerns about vision?: no *vision screening is required for WTM* Concerns about hearing?: no  Fall  Screening Falls in the past year?: 0 Number of falls in past year: 0 Was there an injury with Fall?: 0 Fall Risk Category Calculator: 0 Patient Fall Risk Level: Low Fall Risk  Fall Risk Patient at Risk for Falls Due to: Impaired mobility; Medication side effect Fall risk Follow up: Falls evaluation completed; Falls prevention discussed  Home and Transportation Safety: All rugs have non-skid backing?: yes All stairs or steps have railings?: N/A, no stairs Grab bars in the bathtub or shower?: yes Have non-skid surface in bathtub or shower?: yes Good home lighting?: yes Regular seat belt use?: yes Hospital stays in the last year:: no  Cognitive Assessment Difficulty concentrating, remembering, or making decisions? : yes Will 6CIT or Mini Cog be Completed: yes What year is it?: 4 points What month is it?: 0 points Give patient an address phrase to remember (5 components): [address] About what time is it?: 3 points Count backwards from 20 to 1: 4 points (did not try) Say the months of the year in reverse: 4 points (did not try) Repeat the address phrase from earlier: 10 points 6 CIT Score: 25 points  Advance Directives (For Healthcare) Does Patient Have a Medical Advance Directive?: Yes Type of Advance Directive: Healthcare Power of [place]; Living will Copy of Healthcare Power of Attorney in Chart?: No - copy requested Copy of Living Will in Chart?: No - copy requested  Reviewed/Updated  Reviewed/Updated: Reviewed All (Medical, Surgical, Family, Medications, Allergies, Care Teams, Patient Goals)    Allergies (verified) Other   Current Medications (verified) Outpatient Encounter Medications as of 08/29/2024  Medication Sig   Accu-Chek FastClix Lancets MISC USE AS DIRECTED   acetaminophen  (TYLENOL ) 500 MG tablet Take 500 mg by mouth every 6 (six) hours as needed for moderate pain.   amLODipine  (NORVASC ) 10 MG tablet Take 10 mg  by mouth daily.   blood glucose  meter kit and supplies KIT Dispense based on patient and insurance preference. Use up to four times daily as directed.   Blood Glucose Monitoring Suppl (FORA V12 BLOOD GLUCOSE SYSTEM) DEVI Testing once a day   colchicine  0.6 MG tablet TAKE 1 TABLET(0.6 MG) BY MOUTH DAILY   finasteride  (PROSCAR ) 5 MG tablet Take 1 tablet (5 mg total) by mouth daily.   gabapentin  (NEURONTIN ) 100 MG capsule TAKE 1 CAPSULE(100 MG) BY MOUTH TWICE DAILY   glucose blood (ACCU-CHEK GUIDE) test strip CHECK BLOOD GLUCOSE TWICE DAILY   hydrochlorothiazide  (MICROZIDE ) 12.5 MG capsule Take 1 capsule (12.5 mg total) by mouth daily.   hydrOXYzine  (ATARAX ) 10 MG tablet TAKE 1 TABLET(10 MG) BY MOUTH THREE TIMES DAILY AS NEEDED   ibuprofen (ADVIL) 200 MG tablet Take 200 mg by mouth every 6 (six) hours as needed for mild pain.   Lancet Devices (LITE TOUCH LANCING DEVICE) MISC    lovastatin  (MEVACOR ) 40 MG tablet TAKE 1 TABLET(40 MG) BY MOUTH AT BEDTIME   pioglitazone -metformin  (ACTOPLUS MET ) 15-850 MG tablet TAKE 1 TABLET BY MOUTH TWICE DAILY WITH A MEAL   sitaGLIPtin  (JANUVIA ) 50 MG tablet Take 1 tablet (50 mg total) by mouth daily.   Continuous Glucose Receiver (FREESTYLE LIBRE 3 READER) DEVI Use with libre 3 plus sensor. (Patient not taking: Reported on 08/29/2024)   No facility-administered encounter medications on file as of 08/29/2024.    History: Past Medical History:  Diagnosis Date   AAA (abdominal aortic aneurysm)    Arthritis    BPH (benign prostatic hypertrophy)    Cataract    Diabetes mellitus    Dyslipidemia    Hypertension    Dr. Baldwin,    Neuromuscular disorder [facility])    carpal tunnel in right wrist   No blood products    Refusal of blood transfusions as patient is Jehovah's Witness    Past Surgical History:  Procedure Laterality Date   CARPAL TUNNEL RELEASE     CHOLECYSTECTOMY     COLONOSCOPY  2008   EYE SURGERY     bilat cataract    FALSE ANEURYSM REPAIR  07/16/2012   Procedure: REPAIR FALSE  ANEURYSM;  Surgeon: Krystal JULIANNA Doing, MD;  Location: [facility] OR;  Service: Vascular;  Laterality: Bilateral;  false femoral aneurysm repair with femoral artery repair   GAS/FLUID EXCHANGE  01/28/2012   Procedure: GAS/FLUID EXCHANGE;  Surgeon: Norleen JONETTA Ku, MD;  Location: [facility] OR;  Service: Ophthalmology;  Laterality: Right;   I & D EXTREMITY  09/23/2012   Procedure: IRRIGATION AND DEBRIDEMENT EXTREMITY;  Surgeon: Krystal JULIANNA Doing, MD;  Location: [facility] OR;  Service: Vascular;  Laterality: Right;  Irrigation and Debridement prepubic and right groin wound   PARS PLANA VITRECTOMY W/ REPAIR OF MACULAR HOLE  01/2012   PARS PLANA VITRECTOMY W/ SCLERAL BUCKLE  01/28/2012   Procedure: PARS PLANA VITRECTOMY WITH LASER FOR MACULAR HOLE;  Surgeon: Norleen JONETTA Ku, MD;  Location: [facility] OR;  Service: Ophthalmology;  Laterality: Right;  25 gauge ppv right eye    SIGMOIDOSCOPY     SKIN BIOPSY Left 10/21/2020   pyogenic granuloma ulcerated    TOTAL KNEE ARTHROPLASTY  2009   Left   TRANSURETHRAL RESECTION OF PROSTATE N/A 03/11/2022   Procedure: TRANSURETHRAL RESECTION OF THE PROSTATE (TURP);  Surgeon: Alvaro Hummer, MD;  Location: [facility] ORS;  Service: Urology;  Laterality: N/A;  93 MINS   Family History  Problem Relation Age of Onset  Cancer Mother        ovarian   Diabetes Father    Social History   Occupational History   Not on file  Tobacco Use   Smoking status: Former    Current packs/day: 0.00    Types: Cigarettes    Quit date: 07/26/1972    Years since quitting: 52.1   Smokeless tobacco: Never  Vaping Use   Vaping status: Never Used  Substance and Sexual Activity   Alcohol use: Yes    Alcohol/week: 3.0 standard drinks of alcohol    Types: 3 Shots of liquor per week    Comment: every day or so   Drug use: No   Sexual activity: Not Currently   Tobacco Counseling Counseling given: Not Answered  SDOH Screenings   Food Insecurity: No Food Insecurity (08/29/2024)  Housing: Unknown (08/29/2024)   Transportation Needs: No Transportation Needs (08/29/2024)  Utilities: Not At Risk (08/29/2024)  Alcohol Screen: Low Risk (08/29/2024)  Depression (PHQ2-9): Low Risk (08/29/2024)  Financial Resource Strain: Low Risk (08/29/2024)  Physical Activity: Inactive (08/29/2024)  Social Connections: Moderately Isolated (08/29/2024)  Stress: No Stress Concern Present (08/29/2024)  Tobacco Use: Medium Risk (08/29/2024)  Health Literacy: Adequate Health Literacy (08/29/2024)   See flowsheets for full screening details  Depression Screen PHQ 2 & 9 Depression Scale- Over the past 2 weeks, how often have you been bothered by any of the following problems? Little interest or pleasure in doing things: 0 Feeling down, depressed, or hopeless (PHQ Adolescent also includes...irritable): 0 PHQ-2 Total Score: 0 Trouble falling or staying asleep, or sleeping too much: 0 Feeling tired or having little energy: 0 Poor appetite or overeating (PHQ Adolescent also includes...weight loss): 2 Feeling bad about yourself - or that you are a failure or have let yourself or your family down: 0 Trouble concentrating on things, such as reading the newspaper or watching television (PHQ Adolescent also includes...like school work): 0 Moving or speaking so slowly that other people could have noticed. Or the opposite - being so fidgety or restless that you have been moving around a lot more than usual: 0 Thoughts that you would be better off dead, or of hurting yourself in some way: 0 PHQ-9 Total Score: 2 If you checked off any problems, how difficult have these problems made it for you to do your work, take care of things at home, or get along with other people?: Not difficult at all  Depression Treatment Depression Interventions/Treatment : EYV7-0 Score <4 Follow-up Not Indicated     Goals Addressed             This Visit's Progress    Patient Stated       08/29/2024, stay alive             Objective:     Today's Vitals   There is no height or weight on file to calculate BMI.  Hearing/Vision screen Hearing Screening - Comments:: Denies hearing issues Vision Screening - Comments:: No regular eye exams Immunizations and Health Maintenance Health Maintenance  Topic Date Due   Zoster Vaccines- Shingrix (1 of 2) Never done   OPHTHALMOLOGY EXAM  07/29/2016   DTaP/Tdap/Td (2 - Td or Tdap) 01/20/2022   HEMOGLOBIN A1C  07/22/2024   COVID-19 Vaccine (6 - 2025-26 season) 12/27/2024   FOOT EXAM  01/19/2025   Medicare Annual Wellness (AWV)  08/29/2025   Pneumococcal Vaccine: 50+ Years  Completed   Influenza Vaccine  Completed   Meningococcal B Vaccine  Aged  Out        Assessment/Plan:  This is a routine wellness examination for [place].  Patient Care Team: Joyce Norleen BROCKS, MD as PCP - General (Family Medicine) Alvaro Ricardo KATHEE Raddle., MD as Consulting Physician (Urology) Bethena Andre Cava III, PA-C as Physician Assistant (Physician Assistant)  I have personally reviewed and noted the following in the patients chart:   Medical and social history Use of alcohol, tobacco or illicit drugs  Current medications and supplements including opioid prescriptions. Functional ability and status Nutritional status Physical activity Advanced directives List of other physicians Hospitalizations, surgeries, and ER visits in previous 12 months Vitals Screenings to include cognitive, depression, and falls Referrals and appointments  No orders of the defined types were placed in this encounter.  In addition, I have reviewed and discussed with patient certain preventive protocols, quality metrics, and best practice recommendations. A written personalized care plan for preventive services as well as general preventive health recommendations were provided to patient.   Ardella FORBES Dawn, LPN   [id number]/83/[id number]   Return in 1 year (on 08/29/2025).  After Visit Summary: (MyChart) Due to this being a telephonic  visit, the after visit summary with patients personalized plan was offered to patient via MyChart   Nurse Notes: HM Addressed: Vaccines Due: TDAP and shingles. A1C will be addressed at next appointment. States does not go to the eye doctor.

## 2024-09-08 ENCOUNTER — Other Ambulatory Visit: Payer: Self-pay | Admitting: Family Medicine

## 2024-09-08 DIAGNOSIS — E118 Type 2 diabetes mellitus with unspecified complications: Secondary | ICD-10-CM

## 2024-10-18 ENCOUNTER — Other Ambulatory Visit: Payer: Self-pay | Admitting: Family Medicine

## 2024-10-26 ENCOUNTER — Encounter: Admitting: Family Medicine
